# Patient Record
Sex: Female | Born: 1950
Health system: Southern US, Community
[De-identification: ages and names within clinical notes are randomized; demographics above are authoritative.]

## PROBLEM LIST (undated history)

## (undated) DIAGNOSIS — Z87442 Personal history of urinary calculi: Secondary | ICD-10-CM

## (undated) DIAGNOSIS — B9689 Other specified bacterial agents as the cause of diseases classified elsewhere: Secondary | ICD-10-CM

## (undated) DIAGNOSIS — E119 Type 2 diabetes mellitus without complications: Secondary | ICD-10-CM

## (undated) DIAGNOSIS — C50212 Malignant neoplasm of upper-inner quadrant of left female breast: Secondary | ICD-10-CM

## (undated) DIAGNOSIS — Z7982 Long term (current) use of aspirin: Secondary | ICD-10-CM

## (undated) DIAGNOSIS — K429 Umbilical hernia without obstruction or gangrene: Secondary | ICD-10-CM

## (undated) DIAGNOSIS — K449 Diaphragmatic hernia without obstruction or gangrene: Secondary | ICD-10-CM

## (undated) DIAGNOSIS — I10 Essential (primary) hypertension: Secondary | ICD-10-CM

## (undated) DIAGNOSIS — I700 Atherosclerosis of aorta: Secondary | ICD-10-CM

## (undated) DIAGNOSIS — J189 Pneumonia, unspecified organism: Secondary | ICD-10-CM

## (undated) DIAGNOSIS — Z170 Estrogen receptor positive status [ER+]: Secondary | ICD-10-CM

## (undated) DIAGNOSIS — H269 Unspecified cataract: Secondary | ICD-10-CM

## (undated) DIAGNOSIS — E559 Vitamin D deficiency, unspecified: Secondary | ICD-10-CM

## (undated) DIAGNOSIS — E785 Hyperlipidemia, unspecified: Secondary | ICD-10-CM

## (undated) DIAGNOSIS — M170 Bilateral primary osteoarthritis of knee: Secondary | ICD-10-CM

## (undated) DIAGNOSIS — N958 Other specified menopausal and perimenopausal disorders: Secondary | ICD-10-CM

## (undated) DIAGNOSIS — M1990 Unspecified osteoarthritis, unspecified site: Secondary | ICD-10-CM

## (undated) DIAGNOSIS — N390 Urinary tract infection, site not specified: Secondary | ICD-10-CM

## (undated) DIAGNOSIS — C50919 Malignant neoplasm of unspecified site of unspecified female breast: Secondary | ICD-10-CM

## (undated) DIAGNOSIS — I890 Lymphedema, not elsewhere classified: Secondary | ICD-10-CM

## (undated) DIAGNOSIS — Z79811 Long term (current) use of aromatase inhibitors: Secondary | ICD-10-CM

## (undated) HISTORY — PX: OTHER SURGICAL HISTORY: SHX169

## (undated) HISTORY — DX: Unspecified cataract: H26.9

## (undated) HISTORY — DX: Hyperlipidemia, unspecified: E78.5

## (undated) HISTORY — PX: BREAST LUMPECTOMY: SHX2

## (undated) HISTORY — PX: COLONOSCOPY: SHX174

## (undated) HISTORY — PX: BREAST BIOPSY: SHX20

## (undated) HISTORY — DX: Type 2 diabetes mellitus without complications: E11.9

## (undated) HISTORY — DX: Malignant neoplasm of unspecified site of unspecified female breast: C50.919

## (undated) HISTORY — PX: EYE SURGERY: SHX253

## (undated) HISTORY — PX: BREAST EXCISIONAL BIOPSY: SUR124

## (undated) HISTORY — DX: Essential (primary) hypertension: I10

## (undated) HISTORY — DX: Vitamin D deficiency, unspecified: E55.9

## (undated) HISTORY — PX: DILATION AND CURETTAGE OF UTERUS: SHX78

## (undated) HISTORY — PX: ABDOMINAL HYSTERECTOMY: SHX81

## (undated) HISTORY — DX: Unspecified osteoarthritis, unspecified site: M19.90

---

## 2002-04-05 HISTORY — PX: EYE SURGERY: SHX253

## 2016-12-09 ENCOUNTER — Other Ambulatory Visit: Payer: Self-pay | Admitting: Family Medicine

## 2016-12-09 DIAGNOSIS — Z803 Family history of malignant neoplasm of breast: Secondary | ICD-10-CM

## 2016-12-24 ENCOUNTER — Ambulatory Visit
Admission: RE | Admit: 2016-12-24 | Discharge: 2016-12-24 | Disposition: A | Discharge status: Home / Self Care | Payer: Medicare Other | Source: Ambulatory Visit | Attending: Family Medicine | Admitting: Family Medicine

## 2016-12-24 DIAGNOSIS — Z803 Family history of malignant neoplasm of breast: Secondary | ICD-10-CM

## 2016-12-24 DIAGNOSIS — Z1231 Encounter for screening mammogram for malignant neoplasm of breast: Secondary | ICD-10-CM | POA: Insufficient documentation

## 2017-01-13 ENCOUNTER — Other Ambulatory Visit: Payer: Self-pay | Admitting: *Deleted

## 2017-01-13 ENCOUNTER — Inpatient Hospital Stay
Admission: RE | Admit: 2017-01-13 | Discharge: 2017-01-13 | Disposition: A | Discharge status: Home / Self Care | Payer: Self-pay | Source: Ambulatory Visit | Attending: *Deleted | Admitting: *Deleted

## 2017-01-13 DIAGNOSIS — Z9289 Personal history of other medical treatment: Secondary | ICD-10-CM

## 2018-01-31 ENCOUNTER — Other Ambulatory Visit: Payer: Self-pay | Admitting: Physician Assistant

## 2018-01-31 DIAGNOSIS — Z1231 Encounter for screening mammogram for malignant neoplasm of breast: Secondary | ICD-10-CM

## 2018-03-23 ENCOUNTER — Encounter (HOSPITAL_COMMUNITY): Payer: Self-pay

## 2018-03-23 ENCOUNTER — Ambulatory Visit
Admission: RE | Admit: 2018-03-23 | Discharge: 2018-03-23 | Disposition: A | Discharge status: Home / Self Care | Payer: Medicare Other | Source: Ambulatory Visit | Attending: Physician Assistant | Admitting: Physician Assistant

## 2018-03-23 DIAGNOSIS — Z1231 Encounter for screening mammogram for malignant neoplasm of breast: Secondary | ICD-10-CM | POA: Insufficient documentation

## 2018-08-08 ENCOUNTER — Other Ambulatory Visit: Payer: Self-pay

## 2018-08-08 ENCOUNTER — Ambulatory Visit (INDEPENDENT_AMBULATORY_CARE_PROVIDER_SITE_OTHER): Payer: Medicare Other | Admitting: Family Medicine

## 2018-08-08 ENCOUNTER — Encounter: Payer: Self-pay | Admitting: Family Medicine

## 2018-08-08 VITALS — BP 113/64 | HR 61 | Temp 96.7°F | Ht 63.0 in | Wt 336.0 lb

## 2018-08-08 DIAGNOSIS — M170 Bilateral primary osteoarthritis of knee: Secondary | ICD-10-CM

## 2018-08-08 DIAGNOSIS — E119 Type 2 diabetes mellitus without complications: Secondary | ICD-10-CM | POA: Diagnosis not present

## 2018-08-08 DIAGNOSIS — I10 Essential (primary) hypertension: Secondary | ICD-10-CM

## 2018-08-08 DIAGNOSIS — Z7689 Persons encountering health services in other specified circumstances: Secondary | ICD-10-CM

## 2018-08-08 DIAGNOSIS — E559 Vitamin D deficiency, unspecified: Secondary | ICD-10-CM

## 2018-08-08 DIAGNOSIS — Z6841 Body Mass Index (BMI) 40.0 and over, adult: Secondary | ICD-10-CM

## 2018-08-08 DIAGNOSIS — I152 Hypertension secondary to endocrine disorders: Secondary | ICD-10-CM | POA: Insufficient documentation

## 2018-08-08 DIAGNOSIS — E118 Type 2 diabetes mellitus with unspecified complications: Secondary | ICD-10-CM | POA: Insufficient documentation

## 2018-08-08 DIAGNOSIS — E785 Hyperlipidemia, unspecified: Secondary | ICD-10-CM | POA: Insufficient documentation

## 2018-08-08 DIAGNOSIS — E1169 Type 2 diabetes mellitus with other specified complication: Secondary | ICD-10-CM | POA: Insufficient documentation

## 2018-08-08 DIAGNOSIS — E782 Mixed hyperlipidemia: Secondary | ICD-10-CM

## 2018-08-08 DIAGNOSIS — E1159 Type 2 diabetes mellitus with other circulatory complications: Secondary | ICD-10-CM

## 2018-08-08 MED ORDER — METFORMIN HCL ER 500 MG PO TB24: 500.0000 mg | ORAL_TABLET | Freq: Two times a day (BID) | ORAL | 1 refills | Status: DC

## 2018-08-08 MED ORDER — LOSARTAN POTASSIUM 50 MG PO TABS: 50.0000 mg | ORAL_TABLET | Freq: Every day | ORAL | 1 refills | Status: DC

## 2018-08-08 MED ORDER — DICLOFENAC SODIUM 1 % TD GEL: 2.0000 g | Freq: Four times a day (QID) | TRANSDERMAL | 2 refills | Status: DC

## 2018-08-08 MED ORDER — SPIRONOLACTONE 50 MG PO TABS: 50.0000 mg | ORAL_TABLET | Freq: Every day | ORAL | 1 refills | Status: DC

## 2018-08-08 MED ORDER — OMEGA-3-ACID ETHYL ESTERS 1 G PO CAPS: 1.0000 | ORAL_CAPSULE | Freq: Every day | ORAL | 1 refills | Status: DC

## 2018-08-08 MED ORDER — LOVASTATIN 10 MG PO TABS: 10.0000 mg | ORAL_TABLET | Freq: Every day | ORAL | 1 refills | Status: DC

## 2018-08-08 NOTE — Progress Notes (Signed)
BP 113/64 (BP Location: Right Arm, Patient Position: Sitting, Cuff Size: Normal)   Pulse 61   Temp (!) 96.7 F (35.9 C) (Tympanic)   Ht 5\' 3"  (1.6 m)   Wt (!) 336 lb (152.4 kg)   BMI 59.52 kg/m    Subjective:    Patient ID: Sharon Ware, female    DOB: 27-Jul-1950, 68 y.o.   MRN: [id number]  HPI: Sharon Ware is a 68 y.o. female  Chief Complaint  Patient presents with  . Establish Care  . Medication Refill    Losartan and Lovastatin    . This visit was completed via WebEx due to the restrictions of the COVID-19 pandemic. All issues as above were discussed and addressed. Physical exam was done as above through visual confirmation on WebEx. If it was felt that the patient should be evaluated in the office, they were directed there. The patient verbally consented to this visit. . Location of the patient: home . Location of the provider: home . Those involved with this call:  . Provider: Merrie Roof, PA-C . CMA: Merilyn Baba, [facility] . Front Desk/Registration: Jill Side  . Time spent on call: 30 minutes with patient face to face via video conference. More than 50% of this time was spent in counseling and coordination of care. 10 minutes total spent in review of patient's record and preparation of their chart. I verified patient identity using two factors (patient name and date of birth). Patient consents verbally to being seen via telemedicine visit today.   Presenting today to establish care.   Was previously on naproxen for arthritis of b/l knees, states this helps some but not enough and she knows she shouldn't take it all the time. Sometimes having some swelling, no redness, recent injury, falls. Does typically struggle walking longer distances because of her knees, will use a wheelchair in certain circumstances. Does take glucosamine supplements which don't seem to help.   Last labs were back in June 2019. Last f/u in December 2019. Flu shot 12/10/2017. Has records that she  will bring in from her previous provider.   DM - Last A1C back in June was 6.3, which is about where it usually runs per patient. Last eye exam 03/31/2018 with no abnormal findings. Taking metformin without issue. Trying to eat well. Does not exercise due to arthritis pain.   Home BPs typically running 110/60s-70s. Taking losartan and spironolactone without issue. Denies CP, SOB, HAs, dizziness.   Takes lovastatin and lovaza for HLD. Denies side effects, claudication, CP, SOB.   Hx of vitamin D deficiency, currently on 5000 IU of OTC vitamin D daily.  Relevant past medical, surgical, family and social history reviewed and updated as indicated. Interim medical history since our last visit reviewed. Allergies and medications reviewed and updated.  Review of Systems  Per HPI unless specifically indicated above     Objective:    BP 113/64 (BP Location: Right Arm, Patient Position: Sitting, Cuff Size: Normal)   Pulse 61   Temp (!) 96.7 F (35.9 C) (Tympanic)   Ht 5\' 3"  (1.6 m)   Wt (!) 336 lb (152.4 kg)   BMI 59.52 kg/m   Wt Readings from Last 3 Encounters:  08/08/18 (!) 336 lb (152.4 kg)    Physical Exam Vitals signs and nursing note reviewed.  Constitutional:      General: She is not in acute distress.    Appearance: She is obese.  HENT:     Head: Atraumatic.  Right Ear: External ear normal.     Left Ear: External ear normal.     Nose: Nose normal. No congestion.     Mouth/Throat:     Mouth: Mucous membranes are moist.     Pharynx: Oropharynx is clear. No posterior oropharyngeal erythema.  Eyes:     Extraocular Movements: Extraocular movements intact.     Conjunctiva/sclera: Conjunctivae normal.  Neck:     Musculoskeletal: Normal range of motion.  Cardiovascular:     Comments: Unable to assess via virtual visit Pulmonary:     Effort: Pulmonary effort is normal. No respiratory distress.  Musculoskeletal: Normal range of motion.  Skin:    General: Skin is dry.      Findings: No erythema.  Neurological:     Mental Status: She is alert and oriented to person, place, and time.  Psychiatric:        Mood and Affect: Mood normal.        Thought Content: Thought content normal.        Judgment: Judgment normal.     No results found for this or any previous visit.    Assessment & Plan:   Problem List Items Addressed This Visit      Cardiovascular and Mediastinum   Hypertension associated with diabetes ([place])    Stable and under good control, continue current regimen. Pt set up with lab appt for safety check      Relevant Medications   aspirin 81 MG tablet   spironolactone (ALDACTONE) 50 MG tablet   omega-3 acid ethyl esters (LOVAZA) 1 g capsule   metFORMIN (GLUCOPHAGE-XR) 500 MG 24 hr tablet   lovastatin (MEVACOR) 10 MG tablet   losartan (COZAAR) 50 MG tablet   Other Relevant Orders   Comprehensive metabolic panel (Completed)     Endocrine   Diabetes mellitus without complication (HCC) - Primary    Recheck A1C, adjust as needed. Continue working on lifestyle modifications and current regimen      Relevant Medications   aspirin 81 MG tablet   metFORMIN (GLUCOPHAGE-XR) 500 MG 24 hr tablet   lovastatin (MEVACOR) 10 MG tablet   losartan (COZAAR) 50 MG tablet   Other Relevant Orders   HgB A1c (Completed)     Musculoskeletal and Integument   Arthritis of both knees    Will start diclofenac gel to help reduce amount of PO NSAIDs she's taking. Can take tylenol prn. Work on weight loss, low impact exercises      Relevant Medications   aspirin 81 MG tablet   acetaminophen (TYLENOL) 650 MG CR tablet     Other   Morbid obesity (HCC)   Relevant Medications   metFORMIN (GLUCOPHAGE-XR) 500 MG 24 hr tablet   BMI 50.0-59.9, adult (HCC)    Lifestyle modifications reviewed      Relevant Medications   metFORMIN (GLUCOPHAGE-XR) 500 MG 24 hr tablet   Hyperlipidemia    Recheck lipids, adjust as needed. Continue current regimen and work on diet  and exercise changes      Relevant Medications   aspirin 81 MG tablet   spironolactone (ALDACTONE) 50 MG tablet   omega-3 acid ethyl esters (LOVAZA) 1 g capsule   lovastatin (MEVACOR) 10 MG tablet   losartan (COZAAR) 50 MG tablet   Other Relevant Orders   Lipid Panel w/o Chol/HDL Ratio (Completed)   Vitamin D deficiency    Will check levels to ensure adequate supplementation. Continue current regimen      Relevant Orders  VITAMIN D 25 Hydroxy (Vit-D Deficiency, Fractures) (Completed)    Other Visit Diagnoses    Encounter to establish care           Follow up plan: Return in about 6 months (around 02/08/2019) for 6 month f/u.

## 2018-08-09 ENCOUNTER — Other Ambulatory Visit: Payer: Medicare Other

## 2018-08-09 ENCOUNTER — Ambulatory Visit (INDEPENDENT_AMBULATORY_CARE_PROVIDER_SITE_OTHER): Payer: Medicare Other

## 2018-08-09 ENCOUNTER — Telehealth: Payer: Self-pay

## 2018-08-09 ENCOUNTER — Other Ambulatory Visit: Payer: Self-pay | Admitting: Nurse Practitioner

## 2018-08-09 DIAGNOSIS — E559 Vitamin D deficiency, unspecified: Secondary | ICD-10-CM

## 2018-08-09 DIAGNOSIS — E782 Mixed hyperlipidemia: Secondary | ICD-10-CM

## 2018-08-09 DIAGNOSIS — E1159 Type 2 diabetes mellitus with other circulatory complications: Secondary | ICD-10-CM

## 2018-08-09 DIAGNOSIS — I10 Essential (primary) hypertension: Secondary | ICD-10-CM

## 2018-08-09 DIAGNOSIS — E119 Type 2 diabetes mellitus without complications: Secondary | ICD-10-CM

## 2018-08-09 DIAGNOSIS — Z23 Encounter for immunization: Secondary | ICD-10-CM

## 2018-08-09 NOTE — Telephone Encounter (Signed)
Received a fax that patient will need a PA on Omega 3-Acid Capsules.  Can patient get this OTC or does she need this certain Rx?

## 2018-08-09 NOTE — Addendum Note (Signed)
Addended by: Marnee Guarneri T on: 08/09/2018 11:15 AM   Modules accepted: Orders

## 2018-08-09 NOTE — Telephone Encounter (Signed)
She can take OTC if not covered by insurance

## 2018-08-09 NOTE — Progress Notes (Signed)
New patient with cut on hand, requires Td updated.

## 2018-08-09 NOTE — Telephone Encounter (Signed)
Patient notified

## 2018-08-10 LAB — HEMOGLOBIN A1C
Est. average glucose Bld gHb Est-mCnc: 143 mg/dL
Hgb A1c MFr Bld: 6.6 % — ABNORMAL HIGH (ref 4.8–5.6)

## 2018-08-10 LAB — COMPREHENSIVE METABOLIC PANEL
ALT: 22 IU/L (ref 0–32)
AST: 19 IU/L (ref 0–40)
Albumin/Globulin Ratio: 1.6 (ref 1.2–2.2)
Albumin: 4.4 g/dL (ref 3.8–4.8)
Alkaline Phosphatase: 61 IU/L (ref 39–117)
BUN/Creatinine Ratio: 23 (ref 12–28)
BUN: 20 mg/dL (ref 8–27)
Bilirubin Total: 0.4 mg/dL (ref 0.0–1.2)
CO2: 22 mmol/L (ref 20–29)
Calcium: 9.5 mg/dL (ref 8.7–10.3)
Chloride: 100 mmol/L (ref 96–106)
Creatinine, Ser: 0.86 mg/dL (ref 0.57–1.00)
GFR calc Af Amer: 80 mL/min/{1.73_m2} (ref >59)
GFR calc non Af Amer: 69 mL/min/{1.73_m2} (ref >59)
Globulin, Total: 2.7 g/dL (ref 1.5–4.5)
Glucose: 131 mg/dL — ABNORMAL HIGH (ref 65–99)
Potassium: 4.7 mmol/L (ref 3.5–5.2)
Sodium: 139 mmol/L (ref 134–144)
Total Protein: 7.1 g/dL (ref 6.0–8.5)

## 2018-08-10 LAB — LIPID PANEL W/O CHOL/HDL RATIO
Cholesterol, Total: 164 mg/dL (ref 100–199)
HDL: 43 mg/dL (ref >39)
LDL Calculated: 80 mg/dL (ref 0–99)
Triglycerides: 204 mg/dL — ABNORMAL HIGH (ref 0–149)
VLDL Cholesterol Cal: 41 mg/dL — ABNORMAL HIGH (ref 5–40)

## 2018-08-10 LAB — VITAMIN D 25 HYDROXY (VIT D DEFICIENCY, FRACTURES): Vit D, 25-Hydroxy: 61.7 ng/mL (ref 30.0–100.0)

## 2018-08-11 NOTE — Assessment & Plan Note (Signed)
Recheck lipids, adjust as needed. Continue current regimen and work on diet and exercise changes

## 2018-08-11 NOTE — Assessment & Plan Note (Signed)
Will start diclofenac gel to help reduce amount of PO NSAIDs she's taking. Can take tylenol prn. Work on weight loss, low impact exercises

## 2018-08-11 NOTE — Assessment & Plan Note (Signed)
Recheck A1C, adjust as needed. Continue working on lifestyle modifications and current regimen

## 2018-08-11 NOTE — Assessment & Plan Note (Signed)
Lifestyle modifications reviewed

## 2018-08-11 NOTE — Assessment & Plan Note (Signed)
Will check levels to ensure adequate supplementation. Continue current regimen

## 2018-08-11 NOTE — Assessment & Plan Note (Signed)
Stable and under good control, continue current regimen. Pt set up with lab appt for safety check

## 2019-02-16 ENCOUNTER — Ambulatory Visit (INDEPENDENT_AMBULATORY_CARE_PROVIDER_SITE_OTHER): Payer: Medicare Other | Admitting: Family Medicine

## 2019-02-16 ENCOUNTER — Other Ambulatory Visit: Payer: Self-pay

## 2019-02-16 ENCOUNTER — Ambulatory Visit: Payer: Medicare Other | Admitting: Family Medicine

## 2019-02-16 ENCOUNTER — Encounter: Payer: Self-pay | Admitting: Family Medicine

## 2019-02-16 VITALS — BP 113/71 | HR 67 | Temp 98.3°F

## 2019-02-16 DIAGNOSIS — E1159 Type 2 diabetes mellitus with other circulatory complications: Secondary | ICD-10-CM

## 2019-02-16 DIAGNOSIS — E559 Vitamin D deficiency, unspecified: Secondary | ICD-10-CM | POA: Diagnosis not present

## 2019-02-16 DIAGNOSIS — E782 Mixed hyperlipidemia: Secondary | ICD-10-CM

## 2019-02-16 DIAGNOSIS — M170 Bilateral primary osteoarthritis of knee: Secondary | ICD-10-CM

## 2019-02-16 DIAGNOSIS — E119 Type 2 diabetes mellitus without complications: Secondary | ICD-10-CM

## 2019-02-16 DIAGNOSIS — I10 Essential (primary) hypertension: Secondary | ICD-10-CM

## 2019-02-16 MED ORDER — OMEGA-3-ACID ETHYL ESTERS 1 G PO CAPS: 1.0000 | ORAL_CAPSULE | Freq: Every day | ORAL | 1 refills | Status: DC

## 2019-02-16 MED ORDER — METFORMIN HCL ER 500 MG PO TB24: 500.0000 mg | ORAL_TABLET | Freq: Two times a day (BID) | ORAL | 1 refills | Status: DC

## 2019-02-16 MED ORDER — LOSARTAN POTASSIUM 50 MG PO TABS: 50.0000 mg | ORAL_TABLET | Freq: Every day | ORAL | 1 refills | Status: DC

## 2019-02-16 MED ORDER — SPIRONOLACTONE 50 MG PO TABS: 50.0000 mg | ORAL_TABLET | Freq: Every day | ORAL | 1 refills | Status: DC

## 2019-02-16 MED ORDER — LOVASTATIN 10 MG PO TABS: 10.0000 mg | ORAL_TABLET | Freq: Every day | ORAL | 1 refills | Status: DC

## 2019-02-16 NOTE — Assessment & Plan Note (Signed)
Recheck lipids, adjust as needed. Continue working on lifestyle modifications 

## 2019-02-16 NOTE — Assessment & Plan Note (Signed)
Continue tylenol prn, declines trying voltaren gel

## 2019-02-16 NOTE — Progress Notes (Signed)
BP 113/71   Pulse 67   Temp 98.3 F (36.8 C) (Oral)   SpO2 96%    Subjective:    Patient ID: Sharon Ware, female    DOB: Feb 01, 1951, 68 y.o.   MRN: [id number]  HPI: Sharon Ware is a 68 y.o. female  Chief Complaint  Patient presents with  . Diabetes  . Hyperlipidemia  . Hypertension   Patient presenting today for 6 month f/u chronic conditions.   No new concerns today.   Has not taken the diclofenac gel for her knee arthritis because she read the package insert and felt it was too risky based on her age and other medications. Takes 2 650 mg tylenol daily typically with some mild improvement in pain sxs. Also uses icy hot often.   DM - does not check home sugars. Tries to watch what she eats. Cannot exercise due to her arthritic knee pain. Denies low blood sugar spells. Taking her metformin regularly. UTD on eye exam.   HTN - Not checking home readings regularly. Tolerating medicines well, taking faithfully. Denies Cp, SOB, HAs, dizziness. On lovastatin and lovaza for HLD without issue.   Takes vit D supplements OTC daily, last check showed adequate supplementation which was 6 months ago.   Relevant past medical, surgical, family and social history reviewed and updated as indicated. Interim medical history since our last visit reviewed. Allergies and medications reviewed and updated.  Review of Systems  Per HPI unless specifically indicated above     Objective:    BP 113/71   Pulse 67   Temp 98.3 F (36.8 C) (Oral)   SpO2 96%   Wt Readings from Last 3 Encounters:  08/08/18 (!) 336 lb (152.4 kg)    Physical Exam Vitals signs and nursing note reviewed.  Constitutional:      Appearance: Normal appearance. She is not ill-appearing.  HENT:     Head: Atraumatic.  Eyes:     Extraocular Movements: Extraocular movements intact.     Conjunctiva/sclera: Conjunctivae normal.  Neck:     Musculoskeletal: Normal range of motion and neck supple.  Cardiovascular:    Rate and Rhythm: Normal rate and regular rhythm.     Heart sounds: Normal heart sounds.  Pulmonary:     Effort: Pulmonary effort is normal.     Breath sounds: Normal breath sounds.  Musculoskeletal:     Comments: In wheelchair  Skin:    General: Skin is warm and dry.  Neurological:     Mental Status: She is alert and oriented to person, place, and time.  Psychiatric:        Mood and Affect: Mood normal.        Thought Content: Thought content normal.        Judgment: Judgment normal.     Diabetic Foot Exam - Simple   Simple Foot Form Diabetic Foot exam was performed with the following findings: Yes 02/16/2019  9:56 AM  Visual Inspection No deformities, no ulcerations, no other skin breakdown bilaterally: Yes Sensation Testing Intact to touch and monofilament testing bilaterally: Yes Pulse Check Posterior Tibialis and Dorsalis pulse intact bilaterally: Yes Comments     Results for orders placed or performed in visit on 08/09/18  HgB A1c  Result Value Ref Range   Hgb A1c MFr Bld 6.6 (H) 4.8 - 5.6 %   Est. average glucose Bld gHb Est-mCnc 143 mg/dL  VITAMIN D 25 Hydroxy (Vit-D Deficiency, Fractures)  Result Value Ref Range   Vit D,  25-Hydroxy 61.7 30.0 - 100.0 ng/mL  Lipid Panel w/o Chol/HDL Ratio  Result Value Ref Range   Cholesterol, Total 164 100 - 199 mg/dL   Triglycerides 204 (H) 0 - 149 mg/dL   HDL 43 >39 mg/dL   VLDL Cholesterol Cal 41 (H) 5 - 40 mg/dL   LDL Calculated 80 0 - 99 mg/dL  Comprehensive metabolic panel  Result Value Ref Range   Glucose 131 (H) 65 - 99 mg/dL   BUN 20 8 - 27 mg/dL   Creatinine, Ser 0.86 0.57 - 1.00 mg/dL   GFR calc non Af Amer 69 >59 mL/min/1.73   GFR calc Af Amer 80 >59 mL/min/1.73   BUN/Creatinine Ratio 23 12 - 28   Sodium 139 134 - 144 mmol/L   Potassium 4.7 3.5 - 5.2 mmol/L   Chloride 100 96 - 106 mmol/L   CO2 22 20 - 29 mmol/L   Calcium 9.5 8.7 - 10.3 mg/dL   Total Protein 7.1 6.0 - 8.5 g/dL   Albumin 4.4 3.8 - 4.8 g/dL    Globulin, Total 2.7 1.5 - 4.5 g/dL   Albumin/Globulin Ratio 1.6 1.2 - 2.2   Bilirubin Total 0.4 0.0 - 1.2 mg/dL   Alkaline Phosphatase 61 39 - 117 IU/L   AST 19 0 - 40 IU/L   ALT 22 0 - 32 IU/L      Assessment & Plan:   Problem List Items Addressed This Visit      Cardiovascular and Mediastinum   Hypertension associated with diabetes ([place]) - Primary   Relevant Medications   spironolactone (ALDACTONE) 50 MG tablet   omega-3 acid ethyl esters (LOVAZA) 1 g capsule   metFORMIN (GLUCOPHAGE-XR) 500 MG 24 hr tablet   lovastatin (MEVACOR) 10 MG tablet   losartan (COZAAR) 50 MG tablet   Other Relevant Orders   Comprehensive metabolic panel     Endocrine   Diabetes mellitus without complication (HCC)    Recheck A1C, adjust as needed. Continue working on lifestyle modifications and taking current regimen      Relevant Medications   metFORMIN (GLUCOPHAGE-XR) 500 MG 24 hr tablet   lovastatin (MEVACOR) 10 MG tablet   losartan (COZAAR) 50 MG tablet   Other Relevant Orders   HgB A1c     Musculoskeletal and Integument   Arthritis of both knees    Continue tylenol prn, declines trying voltaren gel        Other   Hyperlipidemia    Recheck lipids, adjust as needed. Continue working on lifestyle modifications      Relevant Medications   spironolactone (ALDACTONE) 50 MG tablet   omega-3 acid ethyl esters (LOVAZA) 1 g capsule   lovastatin (MEVACOR) 10 MG tablet   losartan (COZAAR) 50 MG tablet   Other Relevant Orders   Lipid Panel w/o Chol/HDL Ratio out   Vitamin D deficiency    Stable and under good control, continue OTC supplementation          Follow up plan: Return in about 6 months (around 08/16/2019) for 6 month f/u.

## 2019-02-16 NOTE — Assessment & Plan Note (Signed)
Recheck A1C, adjust as needed. Continue working on lifestyle modifications and taking current regimen

## 2019-02-16 NOTE — Assessment & Plan Note (Signed)
Stable and under good control, continue OTC supplementation

## 2019-02-17 LAB — COMPREHENSIVE METABOLIC PANEL
ALT: 24 IU/L (ref 0–32)
AST: 16 IU/L (ref 0–40)
Albumin/Globulin Ratio: 1.4 (ref 1.2–2.2)
Albumin: 4.2 g/dL (ref 3.8–4.8)
Alkaline Phosphatase: 62 IU/L (ref 39–117)
BUN/Creatinine Ratio: 33 — ABNORMAL HIGH (ref 12–28)
BUN: 26 mg/dL (ref 8–27)
Bilirubin Total: 0.4 mg/dL (ref 0.0–1.2)
CO2: 22 mmol/L (ref 20–29)
Calcium: 9.5 mg/dL (ref 8.7–10.3)
Chloride: 100 mmol/L (ref 96–106)
Creatinine, Ser: 0.78 mg/dL (ref 0.57–1.00)
GFR calc Af Amer: 89 mL/min/{1.73_m2} (ref >59)
GFR calc non Af Amer: 77 mL/min/{1.73_m2} (ref >59)
Globulin, Total: 2.9 g/dL (ref 1.5–4.5)
Glucose: 116 mg/dL — ABNORMAL HIGH (ref 65–99)
Potassium: 4.5 mmol/L (ref 3.5–5.2)
Sodium: 140 mmol/L (ref 134–144)
Total Protein: 7.1 g/dL (ref 6.0–8.5)

## 2019-02-17 LAB — LIPID PANEL W/O CHOL/HDL RATIO
Cholesterol, Total: 163 mg/dL (ref 100–199)
HDL: 41 mg/dL (ref >39)
LDL Chol Calc (NIH): 93 mg/dL (ref 0–99)
Triglycerides: 170 mg/dL — ABNORMAL HIGH (ref 0–149)
VLDL Cholesterol Cal: 29 mg/dL (ref 5–40)

## 2019-02-17 LAB — HEMOGLOBIN A1C
Est. average glucose Bld gHb Est-mCnc: 134 mg/dL
Hgb A1c MFr Bld: 6.3 % — ABNORMAL HIGH (ref 4.8–5.6)

## 2019-06-28 ENCOUNTER — Ambulatory Visit (INDEPENDENT_AMBULATORY_CARE_PROVIDER_SITE_OTHER): Payer: Medicare Other

## 2019-06-28 VITALS — BP 107/65 | HR 67 | Ht 63.0 in | Wt 338.8 lb

## 2019-06-28 DIAGNOSIS — Z0000 Encounter for general adult medical examination without abnormal findings: Secondary | ICD-10-CM

## 2019-06-28 NOTE — Progress Notes (Signed)
Subjective:   Sharon Ware is a 69 y.o. female who presents for an Initial Medicare Annual Wellness Visit.  This visit is being conducted via phone call  - after an attmept to do on video chat - due to the COVID-19 pandemic. This patient has given me verbal consent via phone to conduct this visit, patient states they are participating from their home address. Some vital signs may be absent or patient reported.   Patient identification: identified by name, DOB, and current address.    Review of Systems      Cardiac Risk Factors include: advanced age (>22men, >19 women);dyslipidemia;hypertension;obesity (BMI >30kg/m2)     Objective:    Today's Vitals   06/28/19 1012  BP: 107/65  Pulse: 67  Weight: (!) 338 lb 12.8 oz (153.7 kg)  Height: 5\' 3"  (1.6 m)   Body mass index is 60.02 kg/m.  No flowsheet data found.  Current Medications (verified) Outpatient Encounter Medications as of 06/28/2019  Medication Sig  . acetaminophen (TYLENOL) 650 MG CR tablet Take 650 mg by mouth 2 (two) times a day.  Marland Kitchen aspirin 81 MG tablet Take 81 mg by mouth daily.  . Cholecalciferol (VITAMIN D3 PO) Take 5,000 Units by mouth daily.  Marland Kitchen GLUCOSAMINE HCL PO Take by mouth.  . losartan (COZAAR) 50 MG tablet Take 1 tablet (50 mg total) by mouth daily.  Marland Kitchen lovastatin (MEVACOR) 10 MG tablet Take 1 tablet (10 mg total) by mouth daily.  . Menthol, Topical Analgesic, (ICY HOT EX) Apply topically.  . metFORMIN (GLUCOPHAGE-XR) 500 MG 24 hr tablet Take 1 tablet (500 mg total) by mouth 2 (two) times daily.  Marland Kitchen omega-3 acid ethyl esters (LOVAZA) 1 g capsule Take 1 capsule (1 g total) by mouth daily.  Marland Kitchen spironolactone (ALDACTONE) 50 MG tablet Take 1 tablet (50 mg total) by mouth daily.   No facility-administered encounter medications on file as of 06/28/2019.    Allergies (verified) Morphine and related and Latex   History: Past Medical History:  Diagnosis Date  . Arthritis   . Cataract   . Diabetes mellitus  without complication ([place])   . Hyperlipidemia   . Hypertension   . Vitamin D deficiency    Past Surgical History:  Procedure Laterality Date  . ABDOMINAL HYSTERECTOMY    . BREAST BIOPSY Right 1990s   neg  . BREAST BIOPSY Right 2000s   neg  . BREAST EXCISIONAL BIOPSY Right    neg  . EYE SURGERY    . Salpingo-oophorectomy     Family History  Problem Relation Age of Onset  . Breast cancer Mother 58  . Heart disease Father   . Cancer Paternal Grandfather   . Heart disease Paternal Grandfather    Social History   Socioeconomic History  . Marital status: Married    Spouse name: Not on file  . Number of children: Not on file  . Years of education: Not on file  . Highest education level: Not on file  Occupational History  . Occupation: retired  Tobacco Use  . Smoking status: Never Smoker  . Smokeless tobacco: Never Used  Substance and Sexual Activity  . Alcohol use: Not Currently  . Drug use: Never  . Sexual activity: Not on file  Other Topics Concern  . Not on file  Social History Narrative  . Not on file   Social Determinants of Health   Financial Resource Strain:   . Difficulty of Paying Living Expenses:   Food Insecurity:   .  Worried About Charity fundraiser in the Last Year:   . Arboriculturist in the Last Year:   Transportation Needs:   . Film/video editor (Medical):   Marland Kitchen Lack of Transportation (Non-Medical):   Physical Activity:   . Days of Exercise per Week:   . Minutes of Exercise per Session:   Stress:   . Feeling of Stress :   Social Connections:   . Frequency of Communication with Friends and Family:   . Frequency of Social Gatherings with Friends and Family:   . Attends Religious Services:   . Active Member of Clubs or Organizations:   . Attends Archivist Meetings:   Marland Kitchen Marital Status:     Tobacco Counseling Counseling given: Not Answered   Clinical Intake:  Pre-visit preparation completed: Yes  Pain : No/denies pain      Nutritional Status: BMI > 30  Obese Nutritional Risks: None Diabetes: Yes CBG done?: No Did pt. bring in CBG monitor from home?: No  How often do you need to have someone help you when you read instructions, pamphlets, or other written materials from your doctor or pharmacy?: 1 - Never  Interpreter Needed?: No  Information entered by :: Tiffany Hill,LPN   Activities of Daily Living In your present state of health, do you have any difficulty performing the following activities: 06/28/2019 02/16/2019  Hearing? N N  Comment no hearing aids -  Vision? N N  Comment inocular lens, Dr.King -  Difficulty concentrating or making decisions? N N  Walking or climbing stairs? N Y  Dressing or bathing? N Y  Doing errands, shopping? N N  Preparing Food and eating ? N -  Using the Toilet? N -  In the past six months, have you accidently leaked urine? Y -  Comment pads -  Do you have problems with loss of bowel control? N -  Managing your Medications? N -  Managing your Finances? N -  Housekeeping or managing your Housekeeping? N -  Some recent data might be hidden     Immunizations and Health Maintenance Immunization History  Administered Date(s) Administered  . Influenza-Unspecified 12/09/2018  . Pneumococcal Conjugate-13 02/03/2014  . Pneumococcal Polysaccharide-23 04/05/2009, 04/05/2014  . Td 08/09/2018   Health Maintenance Due  Topic Date Due  . OPHTHALMOLOGY EXAM  04/01/2019    Patient Care Team: Volney American, PA-C as PCP - General (Family Medicine)  Indicate any recent Medical Services you may have received from other than [facility] providers in the past year (date may be approximate).     Assessment:   This is a routine wellness examination for [facility].  Hearing/Vision screen  Hearing Screening   125Hz  250Hz  500Hz  1000Hz  2000Hz  3000Hz  4000Hz  6000Hz  8000Hz   Right ear:           Left ear:           Vision Screening Comments: Dr.King   Dietary issues and  exercise activities discussed: Current Exercise Habits: Home exercise routine, Type of exercise: stretching, Time (Minutes): 20, Frequency (Times/Week): 1, Weekly Exercise (Minutes/Week): 20, Intensity: Mild, Exercise limited by: None identified  Goals Addressed   None    Depression Screen PHQ 2/9 Scores 08/08/2018  PHQ - 2 Score 0  PHQ- 9 Score 0    Fall Risk Fall Risk  06/28/2019 08/08/2018  Falls in the past year? 0 0  Number falls in past yr: 0 0  Comment - None  Injury with Fall? 0 0  Risk for fall due to : - Impaired balance/gait  Follow up - Falls evaluation completed   FALL RISK PREVENTION PERTAINING TO THE HOME:  Any stairs in or around the home? Yes  If so, are there any without handrails? No   Home free of loose throw rugs in walkways, pet beds, electrical cords, etc? Yes  Adequate lighting in your home to reduce risk of falls? Yes   ASSISTIVE DEVICES UTILIZED TO PREVENT FALLS:  Life alert? No  Use of a cane, walker or w/c? Yes  walker  Grab bars in the bathroom? Yes  Shower chair or bench in shower? Yes  Elevated toilet seat or a handicapped toilet? Yes    DME ORDERS:  DME order needed?  No   TIMED UP AND GO:  Unable to perform    Cognitive Function:        Screening Tests Health Maintenance  Topic Date Due  . OPHTHALMOLOGY EXAM  04/01/2019  . COLONOSCOPY  02/16/2020 (Originally 09/08/1998)  . Hepatitis C Screening  02/16/2020 (Originally 1950-04-29)  . HEMOGLOBIN A1C  08/16/2019  . FOOT EXAM  02/16/2020  . MAMMOGRAM  03/23/2020  . TETANUS/TDAP  08/08/2028  . INFLUENZA VACCINE  Completed  . DEXA SCAN  Completed  . PNA vac Low Risk Adult  Completed    Qualifies for Shingles Vaccine? Yes  Zostavax completed n/a. Due for Shingrix. Education has been provided regarding the importance of this vaccine. Pt has been advised to call insurance company to determine out of pocket expense. Advised may also receive vaccine at local pharmacy or Health Dept.  Verbalized acceptance and understanding.  Tdap: up to date   Flu Vaccine: up to date   Pneumococcal Vaccine: up to date   Covid-19 Vaccine: Completed vaccines  Cancer Screenings:  Colorectal Screening: Completed around age 35,  Repeat every 5 years  Mammogram: Completed 03/2018,  Repeat every year; will go after pandemic.   Bone Density: Completed 2014. Results reflect NORMAL  Lung Cancer Screening: (Low Dose CT Chest recommended if Age 72-80 years, 30 pack-year currently smoking OR have quit w/in 15years.) does not qualify.     Additional Screening:  Hepatitis C Screening: does qualify;declined  Vision Screening: Recommended annual ophthalmology exams for early detection of glaucoma and other disorders of the eye. Is the patient up to date with their annual eye exam?  Yes  Who is the provider or what is the name of the office in which the pt attends annual eye exams? Sees dr.king May 23rd, will have him fax over results for diabetic eye exam.   Dental Screening: Recommended annual dental exams for proper oral hygiene  Community Resource Referral:  CRR required this visit?  No       Plan:  I have personally reviewed and addressed the Medicare Annual Wellness questionnaire and have noted the following in the patient's chart:  A. Medical and social history B. Use of alcohol, tobacco or illicit drugs  C. Current medications and supplements D. Functional ability and status E.  Nutritional status F.  Physical activity G. Advance directives H. List of other physicians I.  Hospitalizations, surgeries, and ER visits in previous 12 months J.  [place] such as hearing and vision if needed, cognitive and depression L. Referrals and appointments   In addition, I have reviewed and discussed with patient certain preventive protocols, quality metrics, and best practice recommendations. A written personalized care plan for preventive services as well as general  preventive health  recommendations were provided to patient.   Signed,    Bevelyn Ngo, LPN   [id number]  Nurse Health Advisor   Nurse Notes: none  .

## 2019-06-28 NOTE — Patient Instructions (Signed)
Ms. Sharon Ware , Thank you for taking time to come for your Medicare Wellness Visit. I appreciate your ongoing commitment to your health goals. Please review the following plan we discussed and let me know if I can assist you in the future.   Screening recommendations/referrals: Colonoscopy: declined  Mammogram: declined currently Bone Density: up to date Recommended yearly ophthalmology/optometry visit for glaucoma screening and checkup Recommended yearly dental visit for hygiene and checkup  Vaccinations: Influenza vaccine: up to date Pneumococcal vaccine: up to date Tdap vaccine: up to date Shingles vaccine: shingrix eligible, check with your pharmacy for more information    Covid-19: completed   Advanced directives: Please bring a copy of your health care power of attorney and living will to the office at your convenience.  Conditions/risks identified: diabetic, please have [facility] fax over diabetic eye exam results once this is completed   Next appointment: follow up in one year for your annual wellness visit    Preventive Care 65 Years and Older, Female Preventive care refers to lifestyle choices and visits with your health care provider that can promote health and wellness. What does preventive care include?  A yearly physical exam. This is also called an annual well check.  Dental exams once or twice a year.  Routine eye exams. Ask your health care provider how often you should have your eyes checked.  Personal lifestyle choices, including:  Daily care of your teeth and gums.  Regular physical activity.  Eating a healthy diet.  Avoiding tobacco and drug use.  Limiting alcohol use.  Practicing safe sex.  Taking low-dose aspirin every day.  Taking vitamin and mineral supplements as recommended by your health care provider. What happens during an annual well check? The services and screenings done by your health care provider during your annual well check will  depend on your age, overall health, lifestyle risk factors, and family history of disease. Counseling  Your health care provider may ask you questions about your:  Alcohol use.  Tobacco use.  Drug use.  Emotional well-being.  Home and relationship well-being.  Sexual activity.  Eating habits.  History of falls.  Memory and ability to understand (cognition).  Work and work Statistician.  Reproductive health. Screening  You may have the following tests or measurements:  Height, weight, and BMI.  Blood pressure.  Lipid and cholesterol levels. These may be checked every 5 years, or more frequently if you are over 16 years old.  Skin check.  Lung cancer screening. You may have this screening every year starting at age 65 if you have a 30-pack-year history of smoking and currently smoke or have quit within the past 15 years.  Fecal occult blood test (FOBT) of the stool. You may have this test every year starting at age 69.  Flexible sigmoidoscopy or colonoscopy. You may have a sigmoidoscopy every 5 years or a colonoscopy every 10 years starting at age 55.  Hepatitis C blood test.  Hepatitis B blood test.  Sexually transmitted disease (STD) testing.  Diabetes screening. This is done by checking your blood sugar (glucose) after you have not eaten for a while (fasting). You may have this done every 1-3 years.  Bone density scan. This is done to screen for osteoporosis. You may have this done starting at age 60.  Mammogram. This may be done every 1-2 years. Talk to your health care provider about how often you should have regular mammograms. Talk with your health care provider about your test results,  treatment options, and if necessary, the need for more tests. Vaccines  Your health care provider may recommend certain vaccines, such as:  Influenza vaccine. This is recommended every year.  Tetanus, diphtheria, and acellular pertussis (Tdap, Td) vaccine. You may need a  Td booster every 10 years.  Zoster vaccine. You may need this after age 52.  Pneumococcal 13-valent conjugate (PCV13) vaccine. One dose is recommended after age 68.  Pneumococcal polysaccharide (PPSV23) vaccine. One dose is recommended after age 14. Talk to your health care provider about which screenings and vaccines you need and how often you need them. This information is not intended to replace advice given to you by your health care provider. Make sure you discuss any questions you have with your health care provider. Document Released: 04/18/2015 Document Revised: 12/10/2015 Document Reviewed: 01/21/2015 Elsevier Interactive Patient Education  2017 [employer] Prevention in the Home Falls can cause injuries. They can happen to people of all ages. There are many things you can do to make your home safe and to help prevent falls. What can I do on the outside of my home?  Regularly fix the edges of walkways and driveways and fix any cracks.  Remove anything that might make you trip as you walk through a door, such as a raised step or threshold.  Trim any bushes or trees on the path to your home.  Use bright outdoor lighting.  Clear any walking paths of anything that might make someone trip, such as rocks or tools.  Regularly check to see if handrails are loose or broken. Make sure that both sides of any steps have handrails.  Any raised decks and porches should have guardrails on the edges.  Have any leaves, snow, or ice cleared regularly.  Use sand or salt on walking paths during winter.  Clean up any spills in your garage right away. This includes oil or grease spills. What can I do in the bathroom?  Use night lights.  Install grab bars by the toilet and in the tub and shower. Do not use towel bars as grab bars.  Use non-skid mats or decals in the tub or shower.  If you need to sit down in the shower, use a plastic, non-slip stool.  Keep the floor dry. Clean  up any water that spills on the floor as soon as it happens.  Remove soap buildup in the tub or shower regularly.  Attach bath mats securely with double-sided non-slip rug tape.  Do not have throw rugs and other things on the floor that can make you trip. What can I do in the bedroom?  Use night lights.  Make sure that you have a light by your bed that is easy to reach.  Do not use any sheets or blankets that are too big for your bed. They should not hang down onto the floor.  Have a firm chair that has side arms. You can use this for support while you get dressed.  Do not have throw rugs and other things on the floor that can make you trip. What can I do in the kitchen?  Clean up any spills right away.  Avoid walking on wet floors.  Keep items that you use a lot in easy-to-reach places.  If you need to reach something above you, use a strong step stool that has a grab bar.  Keep electrical cords out of the way.  Do not use floor polish or wax that makes floors  slippery. If you must use wax, use non-skid floor wax.  Do not have throw rugs and other things on the floor that can make you trip. What can I do with my stairs?  Do not leave any items on the stairs.  Make sure that there are handrails on both sides of the stairs and use them. Fix handrails that are broken or loose. Make sure that handrails are as long as the stairways.  Check any carpeting to make sure that it is firmly attached to the stairs. Fix any carpet that is loose or worn.  Avoid having throw rugs at the top or bottom of the stairs. If you do have throw rugs, attach them to the floor with carpet tape.  Make sure that you have a light switch at the top of the stairs and the bottom of the stairs. If you do not have them, ask someone to add them for you. What else can I do to help prevent falls?  Wear shoes that:  Do not have high heels.  Have rubber bottoms.  Are comfortable and fit you well.  Are  closed at the toe. Do not wear sandals.  If you use a stepladder:  Make sure that it is fully opened. Do not climb a closed stepladder.  Make sure that both sides of the stepladder are locked into place.  Ask someone to hold it for you, if possible.  Clearly mark and make sure that you can see:  Any grab bars or handrails.  First and last steps.  Where the edge of each step is.  Use tools that help you move around (mobility aids) if they are needed. These include:  Canes.  Walkers.  Scooters.  Crutches.  Turn on the lights when you go into a dark area. Replace any light bulbs as soon as they burn out.  Set up your furniture so you have a clear path. Avoid moving your furniture around.  If any of your floors are uneven, fix them.  If there are any pets around you, be aware of where they are.  Review your medicines with your doctor. Some medicines can make you feel dizzy. This can increase your chance of falling. Ask your doctor what other things that you can do to help prevent falls. This information is not intended to replace advice given to you by your health care provider. Make sure you discuss any questions you have with your health care provider. Document Released: 01/16/2009 Document Revised: 08/28/2015 Document Reviewed: 04/26/2014 Elsevier Interactive Patient Education  2017 [employer]

## 2019-07-27 LAB — HM DIABETES EYE EXAM

## 2019-08-20 ENCOUNTER — Other Ambulatory Visit: Payer: Self-pay

## 2019-08-20 ENCOUNTER — Ambulatory Visit (INDEPENDENT_AMBULATORY_CARE_PROVIDER_SITE_OTHER): Payer: Medicare Other | Admitting: Family Medicine

## 2019-08-20 ENCOUNTER — Encounter: Payer: Self-pay | Admitting: Family Medicine

## 2019-08-20 VITALS — BP 122/70 | HR 72 | Temp 98.0°F | Wt 342.0 lb

## 2019-08-20 DIAGNOSIS — E782 Mixed hyperlipidemia: Secondary | ICD-10-CM

## 2019-08-20 DIAGNOSIS — I10 Essential (primary) hypertension: Secondary | ICD-10-CM | POA: Diagnosis not present

## 2019-08-20 DIAGNOSIS — E119 Type 2 diabetes mellitus without complications: Secondary | ICD-10-CM

## 2019-08-20 DIAGNOSIS — E1159 Type 2 diabetes mellitus with other circulatory complications: Secondary | ICD-10-CM | POA: Diagnosis not present

## 2019-08-20 MED ORDER — LOSARTAN POTASSIUM 50 MG PO TABS: 50.0000 mg | ORAL_TABLET | Freq: Every day | ORAL | 3 refills | Status: DC

## 2019-08-20 MED ORDER — OMEGA-3-ACID ETHYL ESTERS 1 G PO CAPS: 1.0000 | ORAL_CAPSULE | Freq: Every day | ORAL | 3 refills | Status: DC

## 2019-08-20 MED ORDER — SPIRONOLACTONE 50 MG PO TABS: 50.0000 mg | ORAL_TABLET | Freq: Every day | ORAL | 3 refills | Status: DC

## 2019-08-20 MED ORDER — METFORMIN HCL ER 500 MG PO TB24: 500.0000 mg | ORAL_TABLET | Freq: Two times a day (BID) | ORAL | 3 refills | Status: DC

## 2019-08-20 MED ORDER — LOVASTATIN 10 MG PO TABS: 10.0000 mg | ORAL_TABLET | Freq: Every day | ORAL | 3 refills | Status: DC

## 2019-08-20 NOTE — Progress Notes (Signed)
BP 122/70   Pulse 72   Temp 98 F (36.7 C) (Oral)   Wt (!) 342 lb 0.4 oz (155.1 kg)   SpO2 97%   BMI 60.59 kg/m    Subjective:    Patient ID: Sharon Ware, female    DOB: Sep 28, 1950, 69 y.o.   MRN: [id number]  HPI: Sharon Ware is a 69 y.o. female  Chief Complaint  Patient presents with  . Diabetes  . Hypertension  . Hyperlipidemia   Here today for 6 month f/u chronic conditions. Taking medications faithfully without side effects. Trying to keep a check on her diet, cannot exercise due to chronic joint issues and typically uses wheelchair when going places. Denies CP, SOB, HAs, dizziness, claudication, low blood sugar spells.  BPs 120s/70s at home. Does not check home BSs.   Relevant past medical, surgical, family and social history reviewed and updated as indicated. Interim medical history since our last visit reviewed. Allergies and medications reviewed and updated.  Review of Systems  Per HPI unless specifically indicated above     Objective:    BP 122/70   Pulse 72   Temp 98 F (36.7 C) (Oral)   Wt (!) 342 lb 0.4 oz (155.1 kg)   SpO2 97%   BMI 60.59 kg/m   Wt Readings from Last 3 Encounters:  08/20/19 (!) 342 lb 0.4 oz (155.1 kg)  06/28/19 (!) 338 lb 12.8 oz (153.7 kg)  08/08/18 (!) 336 lb (152.4 kg)    Physical Exam Vitals and nursing note reviewed.  Constitutional:      Appearance: Normal appearance. She is not ill-appearing.  HENT:     Head: Atraumatic.  Eyes:     Extraocular Movements: Extraocular movements intact.     Conjunctiva/sclera: Conjunctivae normal.  Cardiovascular:     Rate and Rhythm: Normal rate and regular rhythm.     Heart sounds: Normal heart sounds.  Pulmonary:     Effort: Pulmonary effort is normal.     Breath sounds: Normal breath sounds.  Musculoskeletal:     Cervical back: Normal range of motion and neck supple.     Comments: In wheelchair, but able to move all 4 extremities  Skin:    General: Skin is warm and dry.   Neurological:     Mental Status: She is alert and oriented to person, place, and time.  Psychiatric:        Mood and Affect: Mood normal.        Thought Content: Thought content normal.        Judgment: Judgment normal.     Results for orders placed or performed in visit on 08/20/19  Comprehensive metabolic panel  Result Value Ref Range   Glucose 131 (H) 65 - 99 mg/dL   BUN 28 (H) 8 - 27 mg/dL   Creatinine, Ser 0.82 0.57 - 1.00 mg/dL   GFR calc non Af Amer 73 >59 mL/min/1.73   GFR calc Af Amer 84 >59 mL/min/1.73   BUN/Creatinine Ratio 34 (H) 12 - 28   Sodium 140 134 - 144 mmol/L   Potassium 5.0 3.5 - 5.2 mmol/L   Chloride 100 96 - 106 mmol/L   CO2 23 20 - 29 mmol/L   Calcium 9.6 8.7 - 10.3 mg/dL   Total Protein 6.9 6.0 - 8.5 g/dL   Albumin 4.4 3.8 - 4.8 g/dL   Globulin, Total 2.5 1.5 - 4.5 g/dL   Albumin/Globulin Ratio 1.8 1.2 - 2.2   Bilirubin Total 0.4 0.0 -  1.2 mg/dL   Alkaline Phosphatase 62 48 - 121 IU/L   AST 17 0 - 40 IU/L   ALT 25 0 - 32 IU/L  Lipid Panel w/o Chol/HDL Ratio  Result Value Ref Range   Cholesterol, Total 165 100 - 199 mg/dL   Triglycerides 164 (H) 0 - 149 mg/dL   HDL 47 >39 mg/dL   VLDL Cholesterol Cal 28 5 - 40 mg/dL   LDL Chol Calc (NIH) 90 0 - 99 mg/dL  HgB A1c  Result Value Ref Range   Hgb A1c MFr Bld 6.6 (H) 4.8 - 5.6 %   Est. average glucose Bld gHb Est-mCnc 143 mg/dL      Assessment & Plan:   Problem List Items Addressed This Visit      Cardiovascular and Mediastinum   Hypertension associated with diabetes ([place]) - Primary    Stable and well controlled, continue current regimen      Relevant Medications   spironolactone (ALDACTONE) 50 MG tablet   omega-3 acid ethyl esters (LOVAZA) 1 g capsule   metFORMIN (GLUCOPHAGE-XR) 500 MG 24 hr tablet   lovastatin (MEVACOR) 10 MG tablet   losartan (COZAAR) 50 MG tablet   Other Relevant Orders   Comprehensive metabolic panel (Completed)     Endocrine   Diabetes mellitus without  complication (HCC)    Recheck A1C, adjust as needed. Continue current regimen and lifestyle modifications      Relevant Medications   metFORMIN (GLUCOPHAGE-XR) 500 MG 24 hr tablet   lovastatin (MEVACOR) 10 MG tablet   losartan (COZAAR) 50 MG tablet   Other Relevant Orders   HgB A1c (Completed)     Other   Morbid obesity (HCC)    Diet and exercise reviewed      Relevant Medications   metFORMIN (GLUCOPHAGE-XR) 500 MG 24 hr tablet   Hyperlipidemia    Recheck lipids, adjust as needed. Continue lifestyle modifications      Relevant Medications   spironolactone (ALDACTONE) 50 MG tablet   omega-3 acid ethyl esters (LOVAZA) 1 g capsule   lovastatin (MEVACOR) 10 MG tablet   losartan (COZAAR) 50 MG tablet   Other Relevant Orders   Lipid Panel w/o Chol/HDL Ratio (Completed)       Follow up plan: Return in about 6 months (around 02/20/2020) for 6 month f/u.

## 2019-08-21 LAB — COMPREHENSIVE METABOLIC PANEL
ALT: 25 IU/L (ref 0–32)
AST: 17 IU/L (ref 0–40)
Albumin/Globulin Ratio: 1.8 (ref 1.2–2.2)
Albumin: 4.4 g/dL (ref 3.8–4.8)
Alkaline Phosphatase: 62 IU/L (ref 48–121)
BUN/Creatinine Ratio: 34 — ABNORMAL HIGH (ref 12–28)
BUN: 28 mg/dL — ABNORMAL HIGH (ref 8–27)
Bilirubin Total: 0.4 mg/dL (ref 0.0–1.2)
CO2: 23 mmol/L (ref 20–29)
Calcium: 9.6 mg/dL (ref 8.7–10.3)
Chloride: 100 mmol/L (ref 96–106)
Creatinine, Ser: 0.82 mg/dL (ref 0.57–1.00)
GFR calc Af Amer: 84 mL/min/{1.73_m2} (ref >59)
GFR calc non Af Amer: 73 mL/min/{1.73_m2} (ref >59)
Globulin, Total: 2.5 g/dL (ref 1.5–4.5)
Glucose: 131 mg/dL — ABNORMAL HIGH (ref 65–99)
Potassium: 5 mmol/L (ref 3.5–5.2)
Sodium: 140 mmol/L (ref 134–144)
Total Protein: 6.9 g/dL (ref 6.0–8.5)

## 2019-08-21 LAB — LIPID PANEL W/O CHOL/HDL RATIO
Cholesterol, Total: 165 mg/dL (ref 100–199)
HDL: 47 mg/dL (ref >39)
LDL Chol Calc (NIH): 90 mg/dL (ref 0–99)
Triglycerides: 164 mg/dL — ABNORMAL HIGH (ref 0–149)
VLDL Cholesterol Cal: 28 mg/dL (ref 5–40)

## 2019-08-21 LAB — HEMOGLOBIN A1C
Est. average glucose Bld gHb Est-mCnc: 143 mg/dL
Hgb A1c MFr Bld: 6.6 % — ABNORMAL HIGH (ref 4.8–5.6)

## 2019-08-21 NOTE — Assessment & Plan Note (Signed)
Recheck A1C, adjust as needed. Continue current regimen and lifestyle modifications 

## 2019-08-21 NOTE — Assessment & Plan Note (Signed)
Diet and exercise reviewed 

## 2019-08-21 NOTE — Assessment & Plan Note (Signed)
Recheck lipids, adjust as needed. Continue lifestyle modifications 

## 2019-08-21 NOTE — Assessment & Plan Note (Signed)
Stable and well controlled, continue current regimen 

## 2019-09-04 ENCOUNTER — Telehealth: Payer: Self-pay | Admitting: Family Medicine

## 2019-09-04 NOTE — Telephone Encounter (Signed)
Copied from [place] [id number]. Topic: General - Other >> Sep 04, 2019 11:05 AM Keene Breath wrote: Reason for CRM: Patient called to inform the nurse or doctor that she needs a physician statement in order to be exempt from her jury duty call.  The information she was given was that she could not be exempt unless she had documentation.  Patient stated that she is not physically able to participate in jury duty.  Please advise and call patient to see what she needs to do to take care of this matter.  CB# [phone]

## 2019-09-04 NOTE — Telephone Encounter (Signed)
Routing to provider. Can this be done or does this need an appointment?

## 2019-09-05 NOTE — Telephone Encounter (Signed)
Needs appt for documentation. Can be video visit

## 2019-09-05 NOTE — Telephone Encounter (Signed)
Scheduled 6/4 8:20

## 2019-09-07 ENCOUNTER — Other Ambulatory Visit: Payer: Self-pay

## 2019-09-07 ENCOUNTER — Encounter: Payer: Self-pay | Admitting: Family Medicine

## 2019-09-07 ENCOUNTER — Ambulatory Visit (INDEPENDENT_AMBULATORY_CARE_PROVIDER_SITE_OTHER): Payer: Medicare Other | Admitting: Family Medicine

## 2019-09-07 VITALS — BP 120/71 | HR 79 | Temp 98.4°F

## 2019-09-07 DIAGNOSIS — M170 Bilateral primary osteoarthritis of knee: Secondary | ICD-10-CM | POA: Diagnosis not present

## 2019-09-07 DIAGNOSIS — G8929 Other chronic pain: Secondary | ICD-10-CM

## 2019-09-07 NOTE — Progress Notes (Signed)
   BP 120/71 Comment: left low arm  Pulse 79   Temp 98.4 F (36.9 C) (Oral)   SpO2 93%    Subjective:    Patient ID: Sharon Ware, female    DOB: 08-May-1950, 69 y.o.   MRN: [id number]  HPI: Sharon Ware is a 69 y.o. female  Chief Complaint  Patient presents with  . Paperwork    pt states won't be able to attend for judy duty   Presenting today with forms for upcoming [name] duty assignment that she requests an excuse for due to mobility issues and chronic joint pains. Is wheelchair bound for the most part and notes sitting for long periods is excruciating for her. Denies any recent changes to medical condition or other concerns today.   Relevant past medical, surgical, family and social history reviewed and updated as indicated. Interim medical history since our last visit reviewed. Allergies and medications reviewed and updated.  Review of Systems  Per HPI unless specifically indicated above     Objective:    BP 120/71 Comment: left low arm  Pulse 79   Temp 98.4 F (36.9 C) (Oral)   SpO2 93%   Wt Readings from Last 3 Encounters:  08/20/19 (!) 342 lb 0.4 oz (155.1 kg)  06/28/19 (!) 338 lb 12.8 oz (153.7 kg)  08/08/18 (!) 336 lb (152.4 kg)    Physical Exam Vitals and nursing note reviewed.  Constitutional:      Appearance: Normal appearance. She is obese. She is not ill-appearing.  HENT:     Head: Atraumatic.  Eyes:     Extraocular Movements: Extraocular movements intact.     Conjunctiva/sclera: Conjunctivae normal.  Cardiovascular:     Rate and Rhythm: Normal rate and regular rhythm.     Heart sounds: Normal heart sounds.  Pulmonary:     Effort: Pulmonary effort is normal.     Breath sounds: Normal breath sounds.  Musculoskeletal:     Cervical back: Normal range of motion and neck supple.     Comments: In wheelchair  Skin:    General: Skin is warm and dry.  Neurological:     Mental Status: She is alert and oriented to person, place, and time.    Psychiatric:        Mood and Affect: Mood normal.        Thought Content: Thought content normal.        Judgment: Judgment normal.     Results for orders placed or performed in visit on 08/20/19  HM DIABETES EYE EXAM  Result Value Ref Range   HM Diabetic Eye Exam No Retinopathy No Retinopathy      Assessment & Plan:   Problem List Items Addressed This Visit      Musculoskeletal and Integument   Arthritis of both knees - Primary    Other Visit Diagnoses    Other chronic pain          Jury duty excuse printed due to being wheelchair bound and having severe arthritis and chronic pain that will not tolerate long days of sitting.   Follow up plan: Return for as scheduled.

## 2019-09-11 ENCOUNTER — Other Ambulatory Visit: Payer: Self-pay | Admitting: Family Medicine

## 2019-09-11 DIAGNOSIS — Z1231 Encounter for screening mammogram for malignant neoplasm of breast: Secondary | ICD-10-CM

## 2019-09-20 ENCOUNTER — Ambulatory Visit
Admission: RE | Admit: 2019-09-20 | Discharge: 2019-09-20 | Disposition: A | Discharge status: Home / Self Care | Payer: Medicare Other | Source: Ambulatory Visit | Attending: Family Medicine | Admitting: Family Medicine

## 2019-09-20 DIAGNOSIS — Z1231 Encounter for screening mammogram for malignant neoplasm of breast: Secondary | ICD-10-CM

## 2020-01-11 ENCOUNTER — Ambulatory Visit: Payer: Medicare Other

## 2020-02-21 ENCOUNTER — Ambulatory Visit: Payer: Medicare Other | Admitting: Family Medicine

## 2020-02-21 ENCOUNTER — Ambulatory Visit (INDEPENDENT_AMBULATORY_CARE_PROVIDER_SITE_OTHER): Payer: Medicare Other | Admitting: Family Medicine

## 2020-02-21 ENCOUNTER — Encounter: Payer: Self-pay | Admitting: Family Medicine

## 2020-02-21 ENCOUNTER — Other Ambulatory Visit: Payer: Self-pay

## 2020-02-21 VITALS — BP 135/84 | HR 71 | Temp 98.3°F | Ht 62.0 in | Wt 339.0 lb

## 2020-02-21 DIAGNOSIS — E559 Vitamin D deficiency, unspecified: Secondary | ICD-10-CM

## 2020-02-21 DIAGNOSIS — E119 Type 2 diabetes mellitus without complications: Secondary | ICD-10-CM | POA: Diagnosis not present

## 2020-02-21 DIAGNOSIS — E782 Mixed hyperlipidemia: Secondary | ICD-10-CM

## 2020-02-21 DIAGNOSIS — E1159 Type 2 diabetes mellitus with other circulatory complications: Secondary | ICD-10-CM

## 2020-02-21 DIAGNOSIS — I152 Hypertension secondary to endocrine disorders: Secondary | ICD-10-CM | POA: Diagnosis not present

## 2020-02-21 LAB — BAYER DCA HB A1C WAIVED: HB A1C (BAYER DCA - WAIVED): 6.1 % (ref <7.0)

## 2020-02-21 NOTE — Assessment & Plan Note (Signed)
Labs drawn today. Await results. Treat as needed.  

## 2020-02-21 NOTE — Patient Instructions (Signed)
Zoster Vaccine, Recombinant injection What is this medicine? ZOSTER VACCINE (ZOS ter vak SEEN) is used to prevent shingles in adults 71 years old and over. This vaccine is not used to treat shingles or nerve pain from shingles. This medicine may be used for other purposes; ask your health care provider or pharmacist if you have questions. COMMON BRAND NAME(S): SHINGRIX What should I tell my health care provider before I take this medicine? They need to know if you have any of these conditions:  blood disorders or disease  cancer like leukemia or lymphoma  immune system problems or therapy  an unusual or allergic reaction to vaccines, other medications, foods, dyes, or preservatives  pregnant or trying to get pregnant  breast-feeding How should I use this medicine? This vaccine is for injection in a muscle. It is given by a health care professional. Talk to your pediatrician regarding the use of this medicine in children. This medicine is not approved for use in children. Overdosage: If you think you have taken too much of this medicine contact a poison control center or emergency room at once. NOTE: This medicine is only for you. Do not share this medicine with others. What if I miss a dose? Keep appointments for follow-up (booster) doses as directed. It is important not to miss your dose. Call your doctor or health care professional if you are unable to keep an appointment. What may interact with this medicine?  medicines that suppress your immune system  medicines to treat cancer  steroid medicines like prednisone or cortisone This list may not describe all possible interactions. Give your health care provider a list of all the medicines, herbs, non-prescription drugs, or dietary supplements you use. Also tell them if you smoke, drink alcohol, or use illegal drugs. Some items may interact with your medicine. What should I watch for while using this medicine? Visit your doctor for  regular check ups. This vaccine, like all vaccines, may not fully protect everyone. What side effects may I notice from receiving this medicine? Side effects that you should report to your doctor or health care professional as soon as possible:  allergic reactions like skin rash, itching or hives, swelling of the face, lips, or tongue  breathing problems Side effects that usually do not require medical attention (report these to your doctor or health care professional if they continue or are bothersome):  chills  headache  fever  nausea, vomiting  redness, warmth, pain, swelling or itching at site where injected  tiredness This list may not describe all possible side effects. Call your doctor for medical advice about side effects. You may report side effects to FDA at 1-800-FDA-1088. Where should I keep my medicine? This vaccine is only given in a clinic, pharmacy, doctor's office, or other health care setting and will not be stored at home. NOTE: This sheet is a summary. It may not cover all possible information. If you have questions about this medicine, talk to your doctor, pharmacist, or health care provider.  2020 Elsevier/Gold Standard (2016-11-01 13:20:30)  

## 2020-02-21 NOTE — Assessment & Plan Note (Signed)
Under good control on current regimen. Continue current regimen. Continue to monitor. Call with any concerns. Refills up to date. Labs drawn today.  

## 2020-02-21 NOTE — Assessment & Plan Note (Signed)
Under good control on current regimen with A1c of 6.1. Continue current regimen. Continue to monitor. Call with any concerns. Refills up to date. Labs drawn today.

## 2020-02-21 NOTE — Assessment & Plan Note (Signed)
Encourage diet and exercise with goal of losing 1-2 lbs per week. Continue to monitor.

## 2020-02-21 NOTE — Progress Notes (Signed)
BP 135/84   Pulse 71   Temp 98.3 F (36.8 C) (Oral)   Ht 5\' 2"  (1.575 m)   Wt (!) 339 lb (153.8 kg)   BMI 62.00 kg/m    Subjective:    Patient ID: Sharon Ware, female    DOB: [birth year], 69 y.o.   MRN: [id number]  HPI: Sharon Ware is a 69 y.o. female  Chief Complaint  Patient presents with  . Hypertension  . Hyperlipidemia  . Diabetes  . Knee Pain    pain doing great recently   DIABETES Hypoglycemic episodes:no Polydipsia/polyuria: no Visual disturbance: no Chest pain: no Paresthesias: yes- chronic due to post-herpetic neuralgia Glucose Monitoring: yes  Accucheck frequency: Not Checking Taking Insulin?: no Blood Pressure Monitoring: not checking Retinal Examination: Up to Date Foot Exam: Not Up to Date Diabetic Education: Completed Pneumovax: Up to Date Influenza: Up to Date Aspirin: yes  HYPERTENSION / HYPERLIPIDEMIA Satisfied with current treatment? yes Duration of hypertension: chronic BP monitoring frequency: not checking BP medication side effects: no Past BP meds: spironalactone, losartan Duration of hyperlipidemia: chronic Cholesterol medication side effects: no Cholesterol supplements: fish oil Past cholesterol medications: lovastatin Medication compliance: excellent compliance Aspirin: yes Recent stressors: no Recurrent headaches: no Visual changes: no Palpitations: no Dyspnea: no Chest pain: no Lower extremity edema: no Dizzy/lightheaded: no  Relevant past medical, surgical, family and social history reviewed and updated as indicated. Interim medical history since our last visit reviewed. Allergies and medications reviewed and updated.  Review of Systems  Constitutional: Negative.   Respiratory: Negative.   Cardiovascular: Negative.   Gastrointestinal: Negative.   Genitourinary: Negative.   Neurological: Negative.   Psychiatric/Behavioral: Negative.     Per HPI unless specifically indicated above     Objective:    BP  135/84   Pulse 71   Temp 98.3 F (36.8 C) (Oral)   Ht 5\' 2"  (1.575 m)   Wt (!) 339 lb (153.8 kg)   BMI 62.00 kg/m   Wt Readings from Last 3 Encounters:  02/21/20 (!) 339 lb (153.8 kg)  08/20/19 (!) 342 lb 0.4 oz (155.1 kg)  06/28/19 (!) 338 lb 12.8 oz (153.7 kg)    Physical Exam Vitals and nursing note reviewed.  Constitutional:      General: She is not in acute distress.    Appearance: Normal appearance. She is obese. She is not ill-appearing, toxic-appearing or diaphoretic.     Comments: Wheelchair bound  HENT:     Head: Normocephalic and atraumatic.     Right Ear: External ear normal.     Left Ear: External ear normal.     Nose: Nose normal.     Mouth/Throat:     Mouth: Mucous membranes are moist.     Pharynx: Oropharynx is clear.  Eyes:     General: No scleral icterus.       Right eye: No discharge.        Left eye: No discharge.     Extraocular Movements: Extraocular movements intact.     Conjunctiva/sclera: Conjunctivae normal.     Pupils: Pupils are equal, round, and reactive to light.  Cardiovascular:     Rate and Rhythm: Normal rate and regular rhythm.     Pulses: Normal pulses.     Heart sounds: Normal heart sounds. No murmur heard.  No friction rub. No gallop.   Pulmonary:     Effort: Pulmonary effort is normal. No respiratory distress.     Breath sounds: Normal breath sounds. No  stridor. No wheezing, rhonchi or rales.  Chest:     Chest wall: No tenderness.  Musculoskeletal:        General: Normal range of motion.     Cervical back: Normal range of motion and neck supple.  Skin:    General: Skin is warm and dry.     Capillary Refill: Capillary refill takes less than 2 seconds.     Coloration: Skin is not jaundiced or pale.     Findings: No bruising, erythema, lesion or rash.  Neurological:     General: No focal deficit present.     Mental Status: She is alert and oriented to person, place, and time. Mental status is at baseline.  Psychiatric:         Mood and Affect: Mood normal.        Behavior: Behavior normal.        Thought Content: Thought content normal.        Judgment: Judgment normal.     Results for orders placed or performed in visit on 08/20/19  HM DIABETES EYE EXAM  Result Value Ref Range   HM Diabetic Eye Exam No Retinopathy No Retinopathy      Assessment & Plan:   Problem List Items Addressed This Visit      Cardiovascular and Mediastinum   Hypertension associated with diabetes ([place]) - Primary    Under good control on current regimen. Continue current regimen. Continue to monitor. Call with any concerns. Refills up to date. Labs drawn today.        Relevant Orders   CBC with Differential/Platelet   Comprehensive metabolic panel   Microalbumin, Urine Waived   TSH     Endocrine   Diabetes mellitus without complication (HCC)    Under good control on current regimen with A1c of 6.1. Continue current regimen. Continue to monitor. Call with any concerns. Refills up to date. Labs drawn today.        Relevant Orders   Bayer DCA Hb A1c Waived   CBC with Differential/Platelet   Comprehensive metabolic panel   Microalbumin, Urine Waived   Urinalysis, Routine w reflex microscopic     Other   Morbid obesity (HCC)    Encourage diet and exercise with goal of losing 1-2 lbs per week. Continue to monitor.       Relevant Orders   TSH   Hyperlipidemia    Under good control on current regimen. Continue current regimen. Continue to monitor. Call with any concerns. Refills up to date. Labs drawn today.        Relevant Orders   Comprehensive metabolic panel   Lipid Panel w/o Chol/HDL Ratio   Vitamin D deficiency    Labs drawn today. Await results. Treat as needed.       Relevant Orders   VITAMIN D 25 Hydroxy (Vit-D Deficiency, Fractures)       Follow up plan: Return in about 6 months (around 08/20/2020).

## 2020-02-22 LAB — CBC WITH DIFFERENTIAL/PLATELET
Basophils Absolute: 0 10*3/uL (ref 0.0–0.2)
Basos: 1 %
EOS (ABSOLUTE): 0.4 10*3/uL (ref 0.0–0.4)
Eos: 6 %
Hematocrit: 47.7 % — ABNORMAL HIGH (ref 34.0–46.6)
Hemoglobin: 15.6 g/dL (ref 11.1–15.9)
Immature Grans (Abs): 0 10*3/uL (ref 0.0–0.1)
Immature Granulocytes: 0 %
Lymphocytes Absolute: 2.9 10*3/uL (ref 0.7–3.1)
Lymphs: 38 %
MCH: 29.5 pg (ref 26.6–33.0)
MCHC: 32.7 g/dL (ref 31.5–35.7)
MCV: 90 fL (ref 79–97)
Monocytes Absolute: 0.7 10*3/uL (ref 0.1–0.9)
Monocytes: 9 %
Neutrophils Absolute: 3.5 10*3/uL (ref 1.4–7.0)
Neutrophils: 46 %
Platelets: 235 10*3/uL (ref 150–450)
RBC: 5.28 x10E6/uL (ref 3.77–5.28)
RDW: 12.5 % (ref 11.7–15.4)
WBC: 7.6 10*3/uL (ref 3.4–10.8)

## 2020-02-22 LAB — COMPREHENSIVE METABOLIC PANEL
ALT: 23 IU/L (ref 0–32)
AST: 20 IU/L (ref 0–40)
Albumin/Globulin Ratio: 1.4 (ref 1.2–2.2)
Albumin: 4.3 g/dL (ref 3.7–4.7)
Alkaline Phosphatase: 69 IU/L (ref 44–121)
BUN/Creatinine Ratio: 30 — ABNORMAL HIGH (ref 12–28)
BUN: 23 mg/dL (ref 8–27)
Bilirubin Total: 0.4 mg/dL (ref 0.0–1.2)
CO2: 23 mmol/L (ref 20–29)
Calcium: 9.4 mg/dL (ref 8.7–10.3)
Chloride: 99 mmol/L (ref 96–106)
Creatinine, Ser: 0.77 mg/dL (ref 0.57–1.00)
GFR calc Af Amer: 90 mL/min/{1.73_m2} (ref >59)
GFR calc non Af Amer: 78 mL/min/{1.73_m2} (ref >59)
Globulin, Total: 3.1 g/dL (ref 1.5–4.5)
Glucose: 124 mg/dL — ABNORMAL HIGH (ref 65–99)
Potassium: 4.9 mmol/L (ref 3.5–5.2)
Sodium: 139 mmol/L (ref 134–144)
Total Protein: 7.4 g/dL (ref 6.0–8.5)

## 2020-02-22 LAB — LIPID PANEL W/O CHOL/HDL RATIO
Cholesterol, Total: 165 mg/dL (ref 100–199)
HDL: 39 mg/dL — ABNORMAL LOW (ref >39)
LDL Chol Calc (NIH): 95 mg/dL (ref 0–99)
Triglycerides: 179 mg/dL — ABNORMAL HIGH (ref 0–149)
VLDL Cholesterol Cal: 31 mg/dL (ref 5–40)

## 2020-02-22 LAB — TSH: TSH: 1.66 u[IU]/mL (ref 0.450–4.500)

## 2020-02-22 LAB — VITAMIN D 25 HYDROXY (VIT D DEFICIENCY, FRACTURES): Vit D, 25-Hydroxy: 66.8 ng/mL (ref 30.0–100.0)

## 2020-04-09 ENCOUNTER — Other Ambulatory Visit: Payer: Self-pay

## 2020-04-09 MED ORDER — LOSARTAN POTASSIUM 50 MG PO TABS: 50.0000 mg | ORAL_TABLET | Freq: Every day | ORAL | 1 refills | Status: DC

## 2020-04-09 MED ORDER — METFORMIN HCL ER 500 MG PO TB24: 500.0000 mg | ORAL_TABLET | Freq: Two times a day (BID) | ORAL | 1 refills | Status: DC

## 2020-04-09 MED ORDER — LOVASTATIN 10 MG PO TABS: 10.0000 mg | ORAL_TABLET | Freq: Every day | ORAL | 1 refills | Status: DC

## 2020-04-09 MED ORDER — SPIRONOLACTONE 50 MG PO TABS: 50.0000 mg | ORAL_TABLET | Freq: Every day | ORAL | 1 refills | Status: DC

## 2020-04-09 NOTE — Telephone Encounter (Signed)
Pt stated all medication need to [facility] [place] Pt would like all medication be refilled for 3 month due to new RX insurance plan .

## 2020-04-09 NOTE — Telephone Encounter (Signed)
Patient requesting to have all medications [facility] [place]. Last seen in October and has 6 month appt in May.

## 2020-04-09 NOTE — Addendum Note (Signed)
Addended by: Pablo Ledger on: 04/09/2020 01:42 PM   Modules accepted: Orders

## 2020-05-30 ENCOUNTER — Ambulatory Visit: Payer: Self-pay | Admitting: *Deleted

## 2020-05-30 ENCOUNTER — Ambulatory Visit: Payer: Medicare Other | Admitting: Nurse Practitioner

## 2020-05-30 NOTE — Telephone Encounter (Signed)
Scheduled for Monday 2/28 since no availability and she has bcbs bonita advised her that if she feels worse may need to go to [facility]

## 2020-05-30 NOTE — Telephone Encounter (Signed)
Per initial encounter, "Pt states she has cloudy urine, pain and pressure with urinating, and thinks she has a UTI. No appts at [facility] today,. I advised pt of this, and she said she heard on our introduction she can speak with a nurse concerning her sx and that is what she would like to do.; her symptoms"; contacted pt regarding her symptoms; they started 05/28/20; recommendations made per nurse triage protocol; she verbalized understanding; there is no availability in the office; pt transferred to [facility] for scheduling; she is seen by Jon Billings, [facility].  Reason for Disposition . Age > 50 years  Answer Assessment - Initial Assessment Questions 1. SEVERITY: "How bad is the pain?"  (e.g., Scale 1-10; mild, moderate, or severe)   - MILD (1-3): complains slightly about urination hurting   - MODERATE (4-7): interferes with normal activities     - SEVERE (8-10): excruciating, unwilling or unable to urinate because of the pain      <1 out of 10 2. FREQUENCY: "How many times have you had painful urination today?"     Twice on 05/30/20 3. PATTERN: "Is pain present every time you urinate or just sometimes?"      yes 4. ONSET: "When did the painful urination start?"     05/28/20 5. FEVER: "Do you have a fever?" If Yes, ask: "What is your temperature, how was it measured, and when did it start?"   no 6. PAST UTI: "Have you had a urine infection before?" If Yes, ask: "When was the last time?" and "What happened that time?"     Yes 3 years ago 7. CAUSE: "What do you think is causing the painful urination?"  (e.g., UTI, scratch, Herpes sore)   ?UTI 8. OTHER SYMPTOMS: "Do you have any other symptoms?" (e.g., flank pain, vaginal discharge, genital sores, urgency, blood in urine) Cloudy urine, urinary pressure 9. PREGNANCY: "Is there any chance you are pregnant?" "When was your last menstrual period?"     n/a  Protocols used: [place]

## 2020-06-02 ENCOUNTER — Ambulatory Visit (INDEPENDENT_AMBULATORY_CARE_PROVIDER_SITE_OTHER): Payer: Medicare Other | Admitting: Family Medicine

## 2020-06-02 ENCOUNTER — Telehealth: Payer: Self-pay

## 2020-06-02 ENCOUNTER — Encounter: Payer: Self-pay | Admitting: Family Medicine

## 2020-06-02 ENCOUNTER — Other Ambulatory Visit: Payer: Self-pay

## 2020-06-02 VITALS — BP 128/82 | HR 69 | Temp 97.9°F | Wt 345.0 lb

## 2020-06-02 DIAGNOSIS — N3001 Acute cystitis with hematuria: Secondary | ICD-10-CM | POA: Diagnosis not present

## 2020-06-02 DIAGNOSIS — R300 Dysuria: Secondary | ICD-10-CM | POA: Diagnosis not present

## 2020-06-02 LAB — URINALYSIS, ROUTINE W REFLEX MICROSCOPIC
Bilirubin, UA: NEGATIVE
Glucose, UA: NEGATIVE
Ketones, UA: NEGATIVE
Nitrite, UA: NEGATIVE
Specific Gravity, UA: 1.02 (ref 1.005–1.030)
Urobilinogen, Ur: 0.2 mg/dL (ref 0.2–1.0)
pH, UA: 5.5 (ref 5.0–7.5)

## 2020-06-02 LAB — MICROSCOPIC EXAMINATION: WBC, UA: >30 /hpf — AB (ref 0–5)

## 2020-06-02 MED ORDER — NITROFURANTOIN MONOHYD MACRO 100 MG PO CAPS: 100.0000 mg | ORAL_CAPSULE | Freq: Two times a day (BID) | ORAL | 0 refills | Status: DC

## 2020-06-02 NOTE — Telephone Encounter (Signed)
I did not. I'm happy to see her if she doesn't get along with her new PCP.

## 2020-06-02 NOTE — Telephone Encounter (Signed)
Pt is wanting to have Dr Wynetta Emery as pcp, states that when [facility] left Dr Wynetta Emery agreed to take her on. Please advise.

## 2020-06-02 NOTE — Progress Notes (Signed)
BP 128/82   Pulse 69   Temp 97.9 F (36.6 C)   Wt (!) 345 lb (156.5 kg)   SpO2 96%   BMI 63.10 kg/m    Subjective:    Patient ID: Sharon Ware, female    DOB: 1950/07/31, 70 y.o.   MRN: [id number]  HPI: Sharon Ware is a 70 y.o. female  Chief Complaint  Patient presents with  . Urinary Tract Infection    Patient states for about a week she is having burning with urination and cloudy urine    URINARY SYMPTOMS Duration: 6 days Dysuria: burning Urinary frequency: no Urgency: no Small volume voids: no Symptom severity: moderate Urinary incontinence: yes Foul odor: no Hematuria: no Abdominal pain: no Back pain: no Suprapubic pain/pressure: no Flank pain: no Fever:  no Vomiting: no Relief with cranberry juice: no Relief with pyridium: no Status: stable Previous urinary tract infection: yes Recurrent urinary tract infection: no History of sexually transmitted disease: no Vaginal discharge: no Treatments attempted: cranberry and increasing fluids   Relevant past medical, surgical, family and social history reviewed and updated as indicated. Interim medical history since our last visit reviewed. Allergies and medications reviewed and updated.  Review of Systems  Constitutional: Negative.   Respiratory: Negative.   Cardiovascular: Negative.   Gastrointestinal: Negative.   Genitourinary: Positive for dysuria, frequency and urgency. Negative for decreased urine volume, difficulty urinating, dyspareunia, enuresis, flank pain, genital sores, hematuria, menstrual problem, pelvic pain, vaginal bleeding, vaginal discharge and vaginal pain.  Musculoskeletal: Negative.   Psychiatric/Behavioral: Negative.     Per HPI unless specifically indicated above     Objective:    BP 128/82   Pulse 69   Temp 97.9 F (36.6 C)   Wt (!) 345 lb (156.5 kg)   SpO2 96%   BMI 63.10 kg/m   Wt Readings from Last 3 Encounters:  06/02/20 (!) 345 lb (156.5 kg)  02/21/20 (!) 339  lb (153.8 kg)  08/20/19 (!) 342 lb 0.4 oz (155.1 kg)    Physical Exam Vitals and nursing note reviewed.  Constitutional:      General: She is not in acute distress.    Appearance: Normal appearance. She is not ill-appearing, toxic-appearing or diaphoretic.     Comments: Wheelchair bound  HENT:     Head: Normocephalic and atraumatic.     Right Ear: External ear normal.     Left Ear: External ear normal.     Nose: Nose normal.     Mouth/Throat:     Mouth: Mucous membranes are moist.     Pharynx: Oropharynx is clear.  Eyes:     General: No scleral icterus.       Right eye: No discharge.        Left eye: No discharge.     Extraocular Movements: Extraocular movements intact.     Conjunctiva/sclera: Conjunctivae normal.     Pupils: Pupils are equal, round, and reactive to light.  Cardiovascular:     Rate and Rhythm: Normal rate and regular rhythm.     Pulses: Normal pulses.     Heart sounds: Normal heart sounds. No murmur heard. No friction rub. No gallop.   Pulmonary:     Effort: Pulmonary effort is normal. No respiratory distress.     Breath sounds: Normal breath sounds. No stridor. No wheezing, rhonchi or rales.  Chest:     Chest wall: No tenderness.  Musculoskeletal:        General: Normal range of motion.  Cervical back: Normal range of motion and neck supple.  Skin:    General: Skin is warm and dry.     Capillary Refill: Capillary refill takes less than 2 seconds.     Coloration: Skin is not jaundiced or pale.     Findings: No bruising, erythema, lesion or rash.  Neurological:     General: No focal deficit present.     Mental Status: She is alert and oriented to person, place, and time. Mental status is at baseline.  Psychiatric:        Mood and Affect: Mood normal.        Behavior: Behavior normal.        Thought Content: Thought content normal.        Judgment: Judgment normal.     Results for orders placed or performed in visit on 02/21/20  Bayer DCA Hb A1c  Waived  Result Value Ref Range   HB A1C (BAYER DCA - WAIVED) 6.1 <7.0 %  CBC with Differential/Platelet  Result Value Ref Range   WBC 7.6 3.4 - 10.8 x10E3/uL   RBC 5.28 3.77 - 5.28 x10E6/uL   Hemoglobin 15.6 11.1 - 15.9 g/dL   Hematocrit 47.7 (H) 34.0 - 46.6 %   MCV 90 79 - 97 fL   MCH 29.5 26.6 - 33.0 pg   MCHC 32.7 31.5 - 35.7 g/dL   RDW 12.5 11.7 - 15.4 %   Platelets 235 150 - 450 x10E3/uL   Neutrophils 46 Not Estab. %   Lymphs 38 Not Estab. %   Monocytes 9 Not Estab. %   Eos 6 Not Estab. %   Basos 1 Not Estab. %   Neutrophils Absolute 3.5 1.4 - 7.0 x10E3/uL   Lymphocytes Absolute 2.9 0.7 - 3.1 x10E3/uL   Monocytes Absolute 0.7 0.1 - 0.9 x10E3/uL   EOS (ABSOLUTE) 0.4 0.0 - 0.4 x10E3/uL   Basophils Absolute 0.0 0.0 - 0.2 x10E3/uL   Immature Granulocytes 0 Not Estab. %   Immature Grans (Abs) 0.0 0.0 - 0.1 x10E3/uL  Comprehensive metabolic panel  Result Value Ref Range   Glucose 124 (H) 65 - 99 mg/dL   BUN 23 8 - 27 mg/dL   Creatinine, Ser 0.77 0.57 - 1.00 mg/dL   GFR calc non Af Amer 78 >59 mL/min/1.73   GFR calc Af Amer 90 >59 mL/min/1.73   BUN/Creatinine Ratio 30 (H) 12 - 28   Sodium 139 134 - 144 mmol/L   Potassium 4.9 3.5 - 5.2 mmol/L   Chloride 99 96 - 106 mmol/L   CO2 23 20 - 29 mmol/L   Calcium 9.4 8.7 - 10.3 mg/dL   Total Protein 7.4 6.0 - 8.5 g/dL   Albumin 4.3 3.7 - 4.7 g/dL   Globulin, Total 3.1 1.5 - 4.5 g/dL   Albumin/Globulin Ratio 1.4 1.2 - 2.2   Bilirubin Total 0.4 0.0 - 1.2 mg/dL   Alkaline Phosphatase 69 44 - 121 IU/L   AST 20 0 - 40 IU/L   ALT 23 0 - 32 IU/L  Lipid Panel w/o Chol/HDL Ratio  Result Value Ref Range   Cholesterol, Total 165 100 - 199 mg/dL   Triglycerides 179 (H) 0 - 149 mg/dL   HDL 39 (L) >39 mg/dL   VLDL Cholesterol Cal 31 5 - 40 mg/dL   LDL Chol Calc (NIH) 95 0 - 99 mg/dL  TSH  Result Value Ref Range   TSH 1.660 0.450 - 4.500 uIU/mL  VITAMIN D 25 Hydroxy (Vit-D Deficiency,  Fractures)  Result Value Ref Range   Vit D,  25-Hydroxy 66.8 30.0 - 100.0 ng/mL      Assessment & Plan:   Problem List Items Addressed This Visit   None   Visit Diagnoses    Acute cystitis with hematuria    -  Primary   WIll treat with nitrofurantoin- await culture. Call with any concerns.    Relevant Orders   Urine Culture   Dysuria       3+ Leuks   Relevant Orders   Urinalysis, Routine w reflex microscopic       Follow up plan: Return if symptoms worsen or fail to improve.

## 2020-06-02 NOTE — Telephone Encounter (Signed)
I'm happy to see patient if she does not get along with new provider, but I did not agree to taking on this patient.

## 2020-06-02 NOTE — Telephone Encounter (Signed)
Called pt to relay Dr Kimarie Coor's message no answer left vm 

## 2020-06-02 NOTE — Telephone Encounter (Signed)
Pt called back, reports that she wants to continue care with Dr. Wynetta Emery because she has seen her twice already. Does not want to see new PCP, please advise

## 2020-06-04 LAB — URINE CULTURE

## 2020-06-05 ENCOUNTER — Other Ambulatory Visit: Payer: Self-pay | Admitting: Family Medicine

## 2020-06-05 ENCOUNTER — Telehealth: Payer: Self-pay | Admitting: Nurse Practitioner

## 2020-06-05 MED ORDER — AMOXICILLIN-POT CLAVULANATE 875-125 MG PO TABS
1.0000 | ORAL_TABLET | Freq: Two times a day (BID) | ORAL | 0 refills | Status: DC
Start: 2020-06-05 — End: 2020-06-24

## 2020-06-05 NOTE — Telephone Encounter (Signed)
See result note.  

## 2020-06-05 NOTE — Telephone Encounter (Signed)
Pt calling regarding the appt she had recently with Dr. Wynetta Emery. She states that with the antibiotic she has not had any improvements with UTI. Pt states that her urine is still cloudy and that she has burning. PT is requesting to follow up with Dr. Wynetta Emery and have another antibiotic sent in for her. Please advise.       [id number] [address], [place] [address]  Phone: [phone] Fax: [phone]  Hours: Not open 24 hours

## 2020-06-23 ENCOUNTER — Telehealth: Payer: Self-pay

## 2020-06-23 NOTE — Telephone Encounter (Signed)
Please advise pt last seen 2/28  Copied from [place] #[id number]. Topic: General - Other >> Jun 23, 2020  9:40 AM Tessa Lerner A wrote: Reason for CRM: Patient has made contact to share that they are continuing to experience urinary discomfort  Patient would like their prescription for antibiotics to be refilled and continue taking them until they feel relief  Patient would like to continue taking amoxicillin-clavulanate (AUGMENTIN) 875-125 MG tablet and receive their prescription at their preferred pharmacy  Patient shares that they are able to submit a urine sample if needed but would prefer to just receive the prescription  Patient declined to make appointment at the time of call with agent   Please contact to advise further if needed

## 2020-06-23 NOTE — Telephone Encounter (Signed)
Appt

## 2020-06-23 NOTE — Telephone Encounter (Signed)
Called pt she scheduled virtual with Dr Neomia Dear tomorrow and will bring in sample this evening or in the morning

## 2020-06-24 ENCOUNTER — Other Ambulatory Visit: Payer: Self-pay

## 2020-06-24 ENCOUNTER — Encounter: Payer: Self-pay | Admitting: Internal Medicine

## 2020-06-24 ENCOUNTER — Telehealth (INDEPENDENT_AMBULATORY_CARE_PROVIDER_SITE_OTHER): Payer: Medicare Other | Admitting: Internal Medicine

## 2020-06-24 ENCOUNTER — Telehealth: Payer: Self-pay

## 2020-06-24 DIAGNOSIS — R399 Unspecified symptoms and signs involving the genitourinary system: Secondary | ICD-10-CM

## 2020-06-24 DIAGNOSIS — R8271 Bacteriuria: Secondary | ICD-10-CM | POA: Diagnosis not present

## 2020-06-24 LAB — URINALYSIS, ROUTINE W REFLEX MICROSCOPIC
Bilirubin, UA: NEGATIVE
Glucose, UA: NEGATIVE
Ketones, UA: NEGATIVE
Nitrite, UA: NEGATIVE
Protein,UA: NEGATIVE
Specific Gravity, UA: 1.015 (ref 1.005–1.030)
Urobilinogen, Ur: 0.2 mg/dL (ref 0.2–1.0)
pH, UA: 6 (ref 5.0–7.5)

## 2020-06-24 LAB — MICROSCOPIC EXAMINATION

## 2020-06-24 MED ORDER — CIPROFLOXACIN HCL 500 MG PO TABS
500.0000 mg | ORAL_TABLET | Freq: Two times a day (BID) | ORAL | 0 refills | Status: AC
Start: 2020-06-24 — End: 2020-06-29

## 2020-06-24 NOTE — Telephone Encounter (Signed)
Copied from [place] [id number]. Topic: General - Other >> Jun 24, 2020  2:38 PM Mcneil, Ja-Kwan wrote: Reason for CRM: Pt stated she was prescribed ciprofloxacin (CIPRO) 500 MG tablet and after reading the papers on the medication there were 5 things listed where she should not be taking the medication. Pt stated she would like to request that another medication be prescribed.

## 2020-06-24 NOTE — Progress Notes (Signed)
There were no vitals taken for this visit.   Subjective:    Patient ID: Sharon Ware, female    DOB: 1950-12-19, 70 y.o.   MRN: [id number]  HPI: Sharon Ware is a 70 y.o. female   This visit was completed via MyChart due to the restrictions of the COVID-19 pandemic. All issues as above were discussed and addressed. Physical exam was done as above through visual confirmation on MyChart. If it was felt that the patient should be evaluated in the office, they were directed there. The patient verbally consented to this visit. Location of the patient: home Location of the provider: work Those involved with this call:  Provider: Charlynne Cousins, MD  Time spent on call: 10 minutes on the phone discussing health concerns.8 minutes total spent in review of patient's record and preparation of their chart.  Urinary Tract Infection  This is a new (has cloudy urine and burning sensation x   last abx stopped sunday morning  x  1 weeks ) problem. The current episode started yesterday (28th of feb x 2 days, then got called in another abx augmentin x 10 days ). The problem occurs every urination. The quality of the pain is described as burning. The pain is mild. There has been no fever. Associated symptoms include frequency. Pertinent negatives include no chills, discharge, flank pain, hematuria, hesitancy, nausea, possible pregnancy, sweats, urgency or vomiting.    Chief Complaint  Patient presents with  . urinary symptoms    UTI started on 2/28, has had 2 different abx and still having symptoms, seemed to clear for a few days but has came back.     Relevant past medical, surgical, family and social history reviewed and updated as indicated. Interim medical history since our last visit reviewed. Allergies and medications reviewed and updated.  Review of Systems  Constitutional: Negative for activity change, appetite change, chills, fatigue and fever.  HENT: Negative for congestion, ear discharge,  ear pain and facial swelling.   Eyes: Negative for pain and itching.  Respiratory: Negative for cough, chest tightness, shortness of breath and wheezing.   Cardiovascular: Negative for chest pain, palpitations and leg swelling.  Gastrointestinal: Negative for abdominal distention, abdominal pain, blood in stool, constipation, diarrhea, nausea and vomiting.  Endocrine: Negative for cold intolerance, heat intolerance, polydipsia, polyphagia and polyuria.  Genitourinary: Positive for frequency. Negative for difficulty urinating, dysuria, flank pain, hematuria, hesitancy and urgency.  Musculoskeletal: Negative for arthralgias, gait problem, joint swelling and myalgias.  Skin: Negative for color change, rash and wound.  Neurological: Negative for dizziness, tremors, speech difficulty, weakness, light-headedness, numbness and headaches.  Hematological: Does not bruise/bleed easily.  Psychiatric/Behavioral: Negative for agitation, confusion, decreased concentration, sleep disturbance and suicidal ideas.    Per HPI unless specifically indicated above     Objective:    There were no vitals taken for this visit.  Wt Readings from Last 3 Encounters:  06/02/20 (!) 345 lb (156.5 kg)  02/21/20 (!) 339 lb (153.8 kg)  08/20/19 (!) 342 lb 0.4 oz (155.1 kg)    Physical Exam Vitals reviewed: not done sec to virtual visit.     Results for orders placed or performed in visit on 06/02/20  Urine Culture   Specimen: Urine   UR  Result Value Ref Range   Urine Culture, Routine Final report (A)    Organism ID, Bacteria Proteus mirabilis (A)    Antimicrobial Susceptibility Comment   Microscopic Examination   Urine  Result Value Ref Range  WBC, UA >30 (A) 0 - 5 /hpf   RBC 11-30 (A) 0 - 2 /hpf   Epithelial Cells (non renal) 0-10 0 - 10 /hpf   Bacteria, UA Moderate (A) None seen/Few  Urinalysis, Routine w reflex microscopic  Result Value Ref Range   Specific Gravity, UA 1.020 1.005 - 1.030   pH, UA  5.5 5.0 - 7.5   Color, UA Yellow Yellow   Appearance Ur Cloudy (A) Clear   Leukocytes,UA 3+ (A) Negative   Protein,UA 1+ (A) Negative/Trace   Glucose, UA Negative Negative   Ketones, UA Negative Negative   RBC, UA 3+ (A) Negative   Bilirubin, UA Negative Negative   Urobilinogen, Ur 0.2 0.2 - 1.0 mg/dL   Nitrite, UA Negative Negative   Microscopic Examination See below:         Proteus mirabilis 2/28  Amoxicillin/Clavulanic Acid  S  Ampicillin           S  Cefepime            S  Ceftriaxone          S  Cefuroxime           S  Ciprofloxacin         S  Ertapenem           S  Gentamicin           S  Levofloxacin          S  Meropenem           S  Nitrofurantoin         R  Piperacillin/Tazobactam    S  Tetracycline          R  Tobramycin           S  Trimethoprim/Sulfa       S    Was tx with nitrofurantoin then changed per cultures   Assessment & Plan:  1. UTi recurrent :  UA positive for leu 2+ Will start pt on ciprofloxacin  Increase fluid intake.     Problem List Items Addressed This Visit   None   Visit Diagnoses    Urinary tract infection symptoms    -  Primary   Relevant Orders   Urinalysis, Routine w reflex microscopic   Urine Culture   Bacteria in urine       Relevant Orders   Urine Culture       Follow up plan: No follow-ups on file.

## 2020-06-25 ENCOUNTER — Other Ambulatory Visit: Payer: Self-pay | Admitting: Internal Medicine

## 2020-06-25 MED ORDER — SULFAMETHOXAZOLE-TRIMETHOPRIM 800-160 MG PO TABS
1.0000 | ORAL_TABLET | Freq: Two times a day (BID) | ORAL | 0 refills | Status: AC
Start: 2020-06-25 — End: 2020-06-30

## 2020-06-25 NOTE — Telephone Encounter (Signed)
Patient informed that a new prescription of Bactrim was sent in to her pharmacy  and to [place]

## 2020-06-25 NOTE — Telephone Encounter (Signed)
Called and spoke with patient, she states that the pharmacist said that the bactrim along with the Spirolactone will cause increased potassium, very quickly.  Patient would like to wait for the culture to return to see what antibiotic will treat the infection.

## 2020-06-25 NOTE — Telephone Encounter (Signed)
Patient has returned call regarding the medication Patient's pharmacist has alerted patient (via patient's husband) to potential complications with the Bactrim as well Patient would like to be contacted to discuss further

## 2020-06-26 ENCOUNTER — Telehealth: Payer: Self-pay

## 2020-06-26 NOTE — Telephone Encounter (Signed)
Copied from [place] [id number]. Topic: General - Other >> Jun 26, 2020  4:13 PM Tessa Lerner A wrote: Reason for CRM: Patient has made contact requesting to be notified when their urine culture analysis is complete Please contact to further advise when possible

## 2020-06-27 ENCOUNTER — Telehealth: Payer: Self-pay

## 2020-06-27 NOTE — Telephone Encounter (Signed)
Spoke with patient and informed her that Dr. Wynetta Emery highly recommends taking the Cipro for her UTI, however patient wants her to go over her lab results before she takes anything.

## 2020-06-27 NOTE — Telephone Encounter (Signed)
Called and spoke with patient, she is very upset, the culture was not sent out the other day,lab did not see the culture, so they are sending it out now.  Patient is now wanting something done for the uti, she does not want to wait until Monday for the culture to return, she states that she feels like she is not getting the care that she needs here.  I informed patient that I should have caught that the culture was not ran when I spoke with her earlier this week, and apologized for missing it.   Can something be sent in for her based on the previous culture?

## 2020-06-27 NOTE — Telephone Encounter (Signed)
Informed patient that Dr. Wynetta Emery has read her chart and has advised that she take the Cipro until the culture results return.

## 2020-06-27 NOTE — Telephone Encounter (Signed)
She has no allergies to cipro. No interactions with it and her other medicines. I would advise her to take the cipro until the culture returns.

## 2020-07-02 LAB — URINE CULTURE

## 2020-07-03 ENCOUNTER — Other Ambulatory Visit: Payer: Self-pay

## 2020-07-03 ENCOUNTER — Ambulatory Visit (INDEPENDENT_AMBULATORY_CARE_PROVIDER_SITE_OTHER): Payer: Medicare Other | Admitting: Nurse Practitioner

## 2020-07-03 ENCOUNTER — Encounter: Payer: Self-pay | Admitting: Nurse Practitioner

## 2020-07-03 VITALS — BP 121/75 | HR 80 | Temp 98.3°F | Wt 345.0 lb

## 2020-07-03 DIAGNOSIS — S39012A Strain of muscle, fascia and tendon of lower back, initial encounter: Secondary | ICD-10-CM | POA: Diagnosis not present

## 2020-07-03 DIAGNOSIS — R8271 Bacteriuria: Secondary | ICD-10-CM

## 2020-07-03 LAB — URINALYSIS, ROUTINE W REFLEX MICROSCOPIC
Bilirubin, UA: NEGATIVE
Glucose, UA: NEGATIVE
Ketones, UA: NEGATIVE
Nitrite, UA: POSITIVE — AB
Protein,UA: NEGATIVE
Specific Gravity, UA: 1.015 (ref 1.005–1.030)
Urobilinogen, Ur: 0.2 mg/dL (ref 0.2–1.0)
pH, UA: 5.5 (ref 5.0–7.5)

## 2020-07-03 LAB — MICROSCOPIC EXAMINATION: RBC, Urine: >30 /hpf — AB (ref 0–2)

## 2020-07-03 MED ORDER — TIZANIDINE HCL 4 MG PO TABS
4.0000 mg | ORAL_TABLET | Freq: Three times a day (TID) | ORAL | 0 refills | Status: DC | PRN
Start: 2020-07-03 — End: 2020-12-02

## 2020-07-03 MED ORDER — METHOCARBAMOL 500 MG PO TABS: 500.0000 mg | ORAL_TABLET | Freq: Three times a day (TID) | ORAL | 0 refills | Status: DC | PRN

## 2020-07-03 NOTE — Patient Instructions (Signed)
Lumbosacral Strain Lumbosacral strain is an injury that causes pain in the lower back (lumbosacral spine). This injury usually happens from overstretching the muscles or ligaments along your spine. Ligaments are cord-like tissues that connect bones to other bones. A strain can affect one or more muscles or ligaments. What are the causes? This condition may be caused by:  A hard, direct hit to the back.  Overstretching the lower back muscles. This may result from: ? A fall. ? Lifting something heavy. ? Repetitive movements such as bending or crouching. What increases the risk? The following factors may make you more likely to develop this condition:  Participating in sports or activities that involve: ? A sudden twist of the back. ? Pushing or pulling motions.  Being overweight or obese.  Having poor strength and flexibility, especially tight hamstrings or weak muscles in the back or abdomen.  Having too much of a curve in the lower back.  Having a pelvis that is tilted forward. What are the signs or symptoms? The main symptom of this condition is pain in the lower back, at the site of the strain. Pain may also be felt down one or both legs. How is this diagnosed? This condition is diagnosed based on your symptoms, your medical history, and a physical exam. During the physical exam, your health care provider may push on certain areas of your back to find the source of your pain. You may be asked to bend forward, backward, and side to side to check your pain and range of motion. You may also have imaging tests, such as X-rays and an MRI. How is this treated? This condition may be treated by:  Applying heat and cold on the affected area.  Taking medicines to help relieve pain and relax your muscles.  Taking NSAIDs, such as ibuprofen, to help reduce swelling and discomfort.  Doing stretching and strengthening exercises for your lower back. Symptoms usually improve within several  weeks of treatment. However, recovery time varies. When your symptoms improve, gradually return to your normal routine as soon as possible to reduce pain, avoid stiffness, and keep muscle strength. Follow these instructions at home: Medicines  Take over-the-counter and prescription medicines only as told by your health care provider.  Ask your health care provider if the medicine prescribed to you: ? Requires you to avoid driving or using heavy machinery. ? Can cause constipation. You may need to take these actions to prevent or treat constipation:  Drink enough fluid to keep your urine pale yellow.  Take over-the-counter or prescription medicines.  Eat foods that are high in fiber, such as beans, whole grains, and fresh fruits and vegetables.  Limit foods that are high in fat and processed sugars, such as fried or sweet foods. Managing pain, stiffness, and swelling  If directed, put ice on the injured area. To do this: ? Put ice in a plastic bag. ? Place a towel between your skin and the bag. ? Leave the ice on for 20 minutes, 2-3 times a day.  If directed, apply heat on the affected area as often as told by your health care provider. Use the heat source that your health care provider recommends, such as a moist heat pack or a heating pad. ? Place a towel between your skin and the heat source. ? Leave the heat on for 20-30 minutes. ? Remove the heat if your skin turns bright red. This is especially important if you are unable to feel pain, heat, or  cold. You may have a greater risk of getting burned.      Activity  Rest as told by your health care provider.  Do not stay in bed. Staying in bed for more than 1-2 days can delay your recovery.  Return to your normal activities as told by your health care provider. Ask your health care provider what activities are safe for you.  Avoid activities that take a lot of energy for as long as told by your health care provider.  Do  exercises as told by your health care provider. This includes stretching and strengthening exercises. General instructions  Sit up and stand up straight. Avoid leaning forward when you sit, or hunching over when you stand.  Do not use any products that contain nicotine or tobacco, such as cigarettes, e-cigarettes, and chewing tobacco. If you need help quitting, ask your health care provider.  Keep all follow-up visits as told by your health care provider. This is important. How is this prevented?  Use correct form when playing sports and lifting heavy objects.  Use good posture when sitting and standing.  Maintain a healthy weight.  Sleep on a mattress with medium firmness to support your back.  Do at least 150 minutes of moderate-intensity exercise each week, such as brisk walking or water aerobics. Try a form of exercise that takes stress off your back, such as swimming or stationary cycling.  Maintain physical fitness, including: ? Strength. ? Flexibility.   Contact a health care provider if:  Your back pain does not improve after several weeks of treatment.  Your symptoms get worse. Get help right away if:  Your back pain is severe.  You cannot stand or walk.  You have difficulty controlling when you urinate or when you have a bowel movement.  You feel nauseous or you vomit.  Your feet or legs get very cold, turn pale, or look blue.  You have numbness, tingling, weakness, or problems using your arms or legs.  You develop any of the following: ? Shortness of breath. ? Dizziness. ? Pain in your legs. ? Weakness in your buttocks or legs. Summary  Lumbosacral strain is an injury that causes pain in the lower back (lumbosacral spine).  This injury usually happens from overstretching the muscles or ligaments along your spine.  This condition may be caused by a direct hit to the lower back or by overstretching the lower back muscles.  Symptoms usually improve  within several weeks of treatment. This information is not intended to replace advice given to you by your health care provider. Make sure you discuss any questions you have with your health care provider. Document Revised: 08/15/2018 Document Reviewed: 08/15/2018 Elsevier Patient Education  [place].

## 2020-07-03 NOTE — Progress Notes (Signed)
Established Patient Office Visit  Subjective:  Patient ID: Sharon Ware, female    DOB: 1950/06/28  Age: 70 y.o. MRN: [id number]  CC:  Chief Complaint  Patient presents with  . Urinary Tract Infection    Recheck urine, finished Cipro last night.   . Back Pain    Low back started Tuesday per patient, got worse last night and this morning     HPI Sharon Ware presents for follow-up on UTI and back pain that started on Tuesday.  UTI  Symptoms improved, urine still slightly cloudy. Finished antibiotic course.  BACK PAIN  Duration: days , since Tuesday Mechanism of injury: unknown Location: low back Onset: sudden Severity: 8/10 Quality: sharp Frequency: intermittent Radiation: none Aggravating factors: movement--getting out of chair Alleviating factors: nothing Status: stable Treatments attempted: rest and APAP  Relief with NSAIDs?: No NSAIDs Taken Nighttime pain:  no Paresthesias / decreased sensation:  no Bowel / bladder incontinence:  no Fevers:  no Dysuria / urinary frequency:  no  Past Medical History:  Diagnosis Date  . Arthritis   . Cataract   . Diabetes mellitus without complication ([place])   . Hyperlipidemia   . Hypertension   . Vitamin D deficiency     Past Surgical History:  Procedure Laterality Date  . ABDOMINAL HYSTERECTOMY    . BREAST BIOPSY Right 1990s   neg  . BREAST BIOPSY Right 2000s   neg  . BREAST EXCISIONAL BIOPSY Right    neg  . EYE SURGERY    . Salpingo-oophorectomy      Family History  Problem Relation Age of Onset  . Breast cancer Mother 72  . Heart disease Father   . Cancer Paternal Grandfather   . Heart disease Paternal Grandfather     Social History   Socioeconomic History  . Marital status: Married    Spouse name: Not on file  . Number of children: Not on file  . Years of education: Not on file  . Highest education level: Not on file  Occupational History  . Occupation: retired  Tobacco Use  . Smoking  status: Never Smoker  . Smokeless tobacco: Never Used  Vaping Use  . Vaping Use: Never used  Substance and Sexual Activity  . Alcohol use: Not Currently  . Drug use: Never  . Sexual activity: Not on file  Other Topics Concern  . Not on file  Social History Narrative  . Not on file   Social Determinants of Health   Financial Resource Strain: Not on file  Food Insecurity: Not on file  Transportation Needs: Not on file  Physical Activity: Not on file  Stress: Not on file  Social Connections: Not on file  Intimate Partner Violence: Not on file    Outpatient Medications Prior to Visit  Medication Sig Dispense Refill  . acetaminophen (TYLENOL) 650 MG CR tablet Take 650 mg by mouth 2 (two) times a day.    Marland Kitchen aspirin 81 MG tablet Take 81 mg by mouth daily.    . Cholecalciferol (VITAMIN D3 PO) Take 5,000 Units by mouth daily.    Marland Kitchen GLUCOSAMINE HCL PO Take by mouth.    . losartan (COZAAR) 50 MG tablet Take 1 tablet (50 mg total) by mouth daily. 90 tablet 1  . lovastatin (MEVACOR) 10 MG tablet Take 1 tablet (10 mg total) by mouth daily. 90 tablet 1  . Menthol, Topical Analgesic, (ICY HOT EX) Apply topically.    . metFORMIN (GLUCOPHAGE-XR) 500 MG 24 hr tablet  Take 1 tablet (500 mg total) by mouth 2 (two) times daily. 180 tablet 1  . Multiple Vitamin (MULTIVITAMIN) tablet Take 1 tablet by mouth daily.    . Omega-3 Fatty Acids (FISH OIL OMEGA-3 PO) Take by mouth daily.    Marland Kitchen spironolactone (ALDACTONE) 50 MG tablet Take 1 tablet (50 mg total) by mouth daily. 90 tablet 1   No facility-administered medications prior to visit.    Allergies  Allergen Reactions  . Morphine And Related Other (See Comments)    Hypotension  . Latex Rash    ROS Review of Systems  Constitutional: Positive for fatigue. Negative for fever.  HENT: Negative.   Eyes: Negative.   Respiratory: Negative.   Cardiovascular: Negative.   Gastrointestinal: Negative.   Genitourinary: Negative.   Musculoskeletal:  Positive for back pain (acute, see HPI).  Skin: Negative.       Objective:    Physical Exam Vitals and nursing note reviewed.  Constitutional:      General: She is not in acute distress.    Appearance: Normal appearance.  HENT:     Head: Normocephalic and atraumatic.  Eyes:     Conjunctiva/sclera: Conjunctivae normal.  Neck:     Vascular: No carotid bruit.  Cardiovascular:     Rate and Rhythm: Normal rate and regular rhythm.     Pulses: Normal pulses.     Heart sounds: Normal heart sounds.  Pulmonary:     Effort: Pulmonary effort is normal.     Breath sounds: Normal breath sounds.  Abdominal:     Palpations: Abdomen is soft.     Tenderness: There is no right CVA tenderness or left CVA tenderness.  Musculoskeletal:        General: Tenderness (lower back to palpation) present.     Cervical back: Normal range of motion.     Comments: Pain with range of motion to lumbar/sacral spine.   Skin:    General: Skin is warm and dry.  Neurological:     General: No focal deficit present.     Mental Status: She is alert and oriented to person, place, and time.  Psychiatric:        Mood and Affect: Mood normal.        Behavior: Behavior normal.        Thought Content: Thought content normal.        Judgment: Judgment normal.     BP 121/75   Pulse 80   Temp 98.3 F (36.8 C) (Oral)   Wt (!) 345 lb (156.5 kg)   SpO2 96%   BMI 63.10 kg/m  Wt Readings from Last 3 Encounters:  07/03/20 (!) 345 lb (156.5 kg)  06/02/20 (!) 345 lb (156.5 kg)  02/21/20 (!) 339 lb (153.8 kg)     Lab Results  Component Value Date   TSH 1.660 02/21/2020   Lab Results  Component Value Date   WBC 7.6 02/21/2020   HGB 15.6 02/21/2020   HCT 47.7 (H) 02/21/2020   MCV 90 02/21/2020   PLT 235 02/21/2020   Lab Results  Component Value Date   NA 139 02/21/2020   K 4.9 02/21/2020   CO2 23 02/21/2020   GLUCOSE 124 (H) 02/21/2020   BUN 23 02/21/2020   CREATININE 0.77 02/21/2020   BILITOT 0.4  02/21/2020   ALKPHOS 69 02/21/2020   AST 20 02/21/2020   ALT 23 02/21/2020   PROT 7.4 02/21/2020   ALBUMIN 4.3 02/21/2020   CALCIUM 9.4 02/21/2020  Lab Results  Component Value Date   CHOL 165 02/21/2020   Lab Results  Component Value Date   HDL 39 (L) 02/21/2020   Lab Results  Component Value Date   LDLCALC 95 02/21/2020   Lab Results  Component Value Date   TRIG 179 (H) 02/21/2020    Lab Results  Component Value Date   HGBA1C 6.1 02/21/2020      Assessment & Plan:   Problem List Items Addressed This Visit   None   Visit Diagnoses    Bacteria in urine    -  Primary   Finished cipro. Symptoms resolved except for cloudy urine. U/A still showed bacteria, leukocytes, and blood. Will send for culture and treat based on results   Relevant Orders   Urinalysis, Routine w reflex microscopic   Urine Culture   Back strain, initial encounter       Acute, pain when standing x2 days. No red flags. Continue tylenol, icy hot. Tizanidine prn to pharmacy. Follow-up if symptoms don't improve or worsen.      Meds ordered this encounter  Medications  . DISCONTD: methocarbamol (ROBAXIN) 500 MG tablet    Sig: Take 1 tablet (500 mg total) by mouth every 8 (eight) hours as needed for muscle spasms.    Dispense:  30 tablet    Refill:  0  . tiZANidine (ZANAFLEX) 4 MG tablet    Sig: Take 1 tablet (4 mg total) by mouth every 8 (eight) hours as needed for muscle spasms.    Dispense:  30 tablet    Refill:  0    Follow-up: Return if symptoms worsen or fail to improve.    Charyl Dancer, NP

## 2020-07-07 ENCOUNTER — Ambulatory Visit (INDEPENDENT_AMBULATORY_CARE_PROVIDER_SITE_OTHER): Payer: Medicare Other

## 2020-07-07 ENCOUNTER — Telehealth: Payer: Self-pay | Admitting: *Deleted

## 2020-07-07 ENCOUNTER — Other Ambulatory Visit: Payer: Self-pay

## 2020-07-07 DIAGNOSIS — R8271 Bacteriuria: Secondary | ICD-10-CM

## 2020-07-07 LAB — URINE CULTURE

## 2020-07-07 MED ORDER — CEFTRIAXONE SODIUM 1 G IJ SOLR
1.0000 g | Freq: Once | INTRAMUSCULAR | Status: AC
  Administered 2020-07-07: 1 g via INTRAMUSCULAR

## 2020-07-07 MED ORDER — CEPHALEXIN 500 MG PO CAPS: 500.0000 mg | ORAL_CAPSULE | Freq: Three times a day (TID) | ORAL | 0 refills | Status: DC

## 2020-07-07 NOTE — Telephone Encounter (Signed)
Opened in error, please see results note.

## 2020-07-07 NOTE — Addendum Note (Signed)
Addended by: Vance Peper A on: 07/07/2020 08:57 AM   Modules accepted: Orders

## 2020-07-25 ENCOUNTER — Encounter: Payer: Self-pay | Admitting: Family Medicine

## 2020-07-25 LAB — HM DIABETES EYE EXAM

## 2020-08-22 ENCOUNTER — Telehealth: Payer: Self-pay

## 2020-08-22 ENCOUNTER — Ambulatory Visit (INDEPENDENT_AMBULATORY_CARE_PROVIDER_SITE_OTHER): Payer: Medicare Other | Admitting: Family Medicine

## 2020-08-22 ENCOUNTER — Encounter: Payer: Self-pay | Admitting: Family Medicine

## 2020-08-22 ENCOUNTER — Other Ambulatory Visit: Payer: Self-pay

## 2020-08-22 VITALS — BP 115/74 | HR 74 | Temp 98.1°F | Wt 343.2 lb

## 2020-08-22 DIAGNOSIS — E559 Vitamin D deficiency, unspecified: Secondary | ICD-10-CM | POA: Diagnosis not present

## 2020-08-22 DIAGNOSIS — E1159 Type 2 diabetes mellitus with other circulatory complications: Secondary | ICD-10-CM

## 2020-08-22 DIAGNOSIS — I152 Hypertension secondary to endocrine disorders: Secondary | ICD-10-CM | POA: Diagnosis not present

## 2020-08-22 DIAGNOSIS — E782 Mixed hyperlipidemia: Secondary | ICD-10-CM

## 2020-08-22 DIAGNOSIS — E119 Type 2 diabetes mellitus without complications: Secondary | ICD-10-CM | POA: Diagnosis not present

## 2020-08-22 DIAGNOSIS — Z1211 Encounter for screening for malignant neoplasm of colon: Secondary | ICD-10-CM

## 2020-08-22 LAB — URINALYSIS, ROUTINE W REFLEX MICROSCOPIC
Bilirubin, UA: NEGATIVE
Glucose, UA: NEGATIVE
Ketones, UA: NEGATIVE
Nitrite, UA: POSITIVE — AB
Protein,UA: NEGATIVE
Specific Gravity, UA: 1.01 (ref 1.005–1.030)
Urobilinogen, Ur: 0.2 mg/dL (ref 0.2–1.0)
pH, UA: 5 (ref 5.0–7.5)

## 2020-08-22 LAB — MICROSCOPIC EXAMINATION

## 2020-08-22 LAB — BAYER DCA HB A1C WAIVED: HB A1C (BAYER DCA - WAIVED): 7 % — ABNORMAL HIGH (ref <7.0)

## 2020-08-22 MED ORDER — METFORMIN HCL ER 500 MG PO TB24: 500.0000 mg | ORAL_TABLET | Freq: Two times a day (BID) | ORAL | 1 refills | Status: DC

## 2020-08-22 MED ORDER — LOVASTATIN 10 MG PO TABS: 10.0000 mg | ORAL_TABLET | Freq: Every day | ORAL | 1 refills | Status: DC

## 2020-08-22 MED ORDER — LOSARTAN POTASSIUM 50 MG PO TABS: 50.0000 mg | ORAL_TABLET | Freq: Every day | ORAL | 1 refills | Status: DC

## 2020-08-22 MED ORDER — SPIRONOLACTONE 50 MG PO TABS: 50.0000 mg | ORAL_TABLET | Freq: Every day | ORAL | 1 refills | Status: DC

## 2020-08-22 NOTE — Assessment & Plan Note (Signed)
A1c up slightly at 7.0- has not been eating well. Will continue current regimen and recheck 3 months. Call with any concerns. Continue to monitor.

## 2020-08-22 NOTE — Telephone Encounter (Signed)
-----   Message from Valerie Roys, DO sent at 08/22/2020 10:33 AM EDT ----- Cologuard please

## 2020-08-22 NOTE — Progress Notes (Signed)
BP 115/74   Pulse 74   Temp 98.1 F (36.7 C)   Wt (!) 343 lb 3.2 oz (155.7 kg)   SpO2 93%   BMI 62.77 kg/m    Subjective:    Patient ID: Sharon Ware, female    DOB: [birth year], 70 y.o.   MRN: [id number]  HPI: [name] is a 70 y.o. female  Chief Complaint  Patient presents with  . Diabetes  . Hypertension  . Hyperlipidemia   DIABETES Hypoglycemic episodes:no Polydipsia/polyuria: no Visual disturbance: no Chest pain: no Paresthesias: no Glucose Monitoring: no  Accucheck frequency: Not Checking Taking Insulin?: no Blood Pressure Monitoring: not checking Retinal Examination: Up to Date Foot Exam: Up to Date Diabetic Education: Completed Pneumovax: Up to Date Influenza: Up to Date Aspirin: yes  HYPERTENSION / HYPERLIPIDEMIA Satisfied with current treatment? yes Duration of hypertension: chronic BP monitoring frequency: not checking BP medication side effects: no Past BP meds: losartan Duration of hyperlipidemia: chronic Cholesterol medication side effects: no Cholesterol supplements: none Past cholesterol medications: lovastatin Medication compliance: excellent compliance Aspirin: no Recent stressors: no Recurrent headaches: no Visual changes: no Palpitations: no Dyspnea: no Chest pain: no Lower extremity edema: no Dizzy/lightheaded: no   Relevant past medical, surgical, family and social history reviewed and updated as indicated. Interim medical history since our last visit reviewed. Allergies and medications reviewed and updated.  Review of Systems  Constitutional: Negative.   Respiratory: Negative.   Cardiovascular: Negative.   Gastrointestinal: Negative.   Musculoskeletal: Negative.   Psychiatric/Behavioral: Negative.     Per HPI unless specifically indicated above     Objective:    BP 115/74   Pulse 74   Temp 98.1 F (36.7 C)   Wt (!) 343 lb 3.2 oz (155.7 kg)   SpO2 93%   BMI 62.77 kg/m   Wt Readings from Last 3  Encounters:  08/22/20 (!) 343 lb 3.2 oz (155.7 kg)  07/03/20 (!) 345 lb (156.5 kg)  06/02/20 (!) 345 lb (156.5 kg)    Physical Exam Vitals and nursing note reviewed.  Constitutional:      General: She is not in acute distress.    Appearance: Normal appearance. She is obese. She is not ill-appearing, toxic-appearing or diaphoretic.  HENT:     Head: Normocephalic and atraumatic.     Right Ear: External ear normal.     Left Ear: External ear normal.     Nose: Nose normal.     Mouth/Throat:     Mouth: Mucous membranes are moist.     Pharynx: Oropharynx is clear.  Eyes:     General: No scleral icterus.       Right eye: No discharge.        Left eye: No discharge.     Extraocular Movements: Extraocular movements intact.     Conjunctiva/sclera: Conjunctivae normal.     Pupils: Pupils are equal, round, and reactive to light.  Cardiovascular:     Rate and Rhythm: Normal rate and regular rhythm.     Pulses: Normal pulses.     Heart sounds: Normal heart sounds. No murmur heard. No friction rub. No gallop.   Pulmonary:     Effort: Pulmonary effort is normal. No respiratory distress.     Breath sounds: Normal breath sounds. No stridor. No wheezing, rhonchi or rales.  Chest:     Chest wall: No tenderness.  Musculoskeletal:        General: Normal range of motion.     Cervical back:  Normal range of motion and neck supple.  Skin:    General: Skin is warm and dry.     Capillary Refill: Capillary refill takes less than 2 seconds.     Coloration: Skin is not jaundiced or pale.     Findings: No bruising, erythema, lesion or rash.  Neurological:     General: No focal deficit present.     Mental Status: She is alert and oriented to person, place, and time. Mental status is at baseline.  Psychiatric:        Mood and Affect: Mood normal.        Behavior: Behavior normal.        Thought Content: Thought content normal.        Judgment: Judgment normal.     Results for orders placed or  performed in visit on 07/30/20  HM DIABETES EYE EXAM  Result Value Ref Range   HM Diabetic Eye Exam No Retinopathy No Retinopathy      Assessment & Plan:   Problem List Items Addressed This Visit      Cardiovascular and Mediastinum   Hypertension associated with diabetes ([place]) - Primary    Under good control on current regimen. Continue current regimen. Continue to monitor. Call with any concerns. Refills given. Labs drawn today.       Relevant Medications   spironolactone (ALDACTONE) 50 MG tablet   metFORMIN (GLUCOPHAGE-XR) 500 MG 24 hr tablet   lovastatin (MEVACOR) 10 MG tablet   losartan (COZAAR) 50 MG tablet   Other Relevant Orders   CBC with Differential/Platelet   Comprehensive metabolic panel     Endocrine   Diabetes mellitus without complication ([facility])    G6Y up slightly at 7.0- has not been eating well. Will continue current regimen and recheck 3 months. Call with any concerns. Continue to monitor.       Relevant Medications   metFORMIN (GLUCOPHAGE-XR) 500 MG 24 hr tablet   lovastatin (MEVACOR) 10 MG tablet   losartan (COZAAR) 50 MG tablet   Other Relevant Orders   Bayer DCA Hb A1c Waived   CBC with Differential/Platelet   Comprehensive metabolic panel   Urinalysis, Routine w reflex microscopic     Other   Morbid obesity ([facility])    Encouraged diet and exercise with goal of losing 1-2lbs per week. Call with any concerns.       Relevant Medications   metFORMIN (GLUCOPHAGE-XR) 500 MG 24 hr tablet   Hyperlipidemia    Under good control on current regimen. Continue current regimen. Continue to monitor. Call with any concerns. Refills given.        Relevant Medications   spironolactone (ALDACTONE) 50 MG tablet   lovastatin (MEVACOR) 10 MG tablet   losartan (COZAAR) 50 MG tablet   Other Relevant Orders   CBC with Differential/Platelet   Comprehensive metabolic panel   Lipid Panel w/o Chol/HDL Ratio   Vitamin D deficiency    Rechecking labs today. Await  results. Treat as needed.       Relevant Orders   CBC with Differential/Platelet   Comprehensive metabolic panel   VITAMIN D 25 Hydroxy (Vit-D Deficiency, Fractures)       Follow up plan: Return in about 3 months (around 11/22/2020).

## 2020-08-22 NOTE — Assessment & Plan Note (Signed)
Encouraged diet and exercise with goal of losing 1-2lbs per week. Call with any concerns.  

## 2020-08-22 NOTE — Assessment & Plan Note (Signed)
Under good control on current regimen. Continue current regimen. Continue to monitor. Call with any concerns. Refills given.   

## 2020-08-22 NOTE — Addendum Note (Signed)
Addended by: Valerie Roys on: 08/22/2020 10:33 AM   Modules accepted: Orders

## 2020-08-22 NOTE — Telephone Encounter (Signed)
Cologuard completed and placed in provider's folder for signature.

## 2020-08-22 NOTE — Assessment & Plan Note (Signed)
Under good control on current regimen. Continue current regimen. Continue to monitor. Call with any concerns. Refills given. Labs drawn today.   

## 2020-08-22 NOTE — Telephone Encounter (Signed)
Pt asked if Dr.Johnson would be her PCP pt stated she would like to be seen by Dr.Johson instead of new PCP as she states she has built a relationship  with Dr.johnson after Apolonio Schneiders left. Please advise.

## 2020-08-22 NOTE — Assessment & Plan Note (Signed)
Rechecking labs today. Await results. Treat as needed.  °

## 2020-08-23 LAB — CBC WITH DIFFERENTIAL/PLATELET
Basophils Absolute: 0 10*3/uL (ref 0.0–0.2)
Basos: 1 %
EOS (ABSOLUTE): 0.2 10*3/uL (ref 0.0–0.4)
Eos: 3 %
Hematocrit: 45.6 % (ref 34.0–46.6)
Hemoglobin: 15.4 g/dL (ref 11.1–15.9)
Immature Grans (Abs): 0 10*3/uL (ref 0.0–0.1)
Immature Granulocytes: 0 %
Lymphocytes Absolute: 2.7 10*3/uL (ref 0.7–3.1)
Lymphs: 38 %
MCH: 29.8 pg (ref 26.6–33.0)
MCHC: 33.8 g/dL (ref 31.5–35.7)
MCV: 88 fL (ref 79–97)
Monocytes Absolute: 0.6 10*3/uL (ref 0.1–0.9)
Monocytes: 8 %
Neutrophils Absolute: 3.5 10*3/uL (ref 1.4–7.0)
Neutrophils: 50 %
Platelets: 208 10*3/uL (ref 150–450)
RBC: 5.17 x10E6/uL (ref 3.77–5.28)
RDW: 12.1 % (ref 11.7–15.4)
WBC: 7 10*3/uL (ref 3.4–10.8)

## 2020-08-23 LAB — COMPREHENSIVE METABOLIC PANEL
ALT: 22 IU/L (ref 0–32)
AST: 15 IU/L (ref 0–40)
Albumin/Globulin Ratio: 1.3 (ref 1.2–2.2)
Albumin: 4.1 g/dL (ref 3.7–4.7)
Alkaline Phosphatase: 58 IU/L (ref 44–121)
BUN/Creatinine Ratio: 31 — ABNORMAL HIGH (ref 12–28)
BUN: 25 mg/dL (ref 8–27)
Bilirubin Total: 0.4 mg/dL (ref 0.0–1.2)
CO2: 23 mmol/L (ref 20–29)
Calcium: 9.3 mg/dL (ref 8.7–10.3)
Chloride: 99 mmol/L (ref 96–106)
Creatinine, Ser: 0.8 mg/dL (ref 0.57–1.00)
Globulin, Total: 3.1 g/dL (ref 1.5–4.5)
Glucose: 135 mg/dL — ABNORMAL HIGH (ref 65–99)
Potassium: 4.7 mmol/L (ref 3.5–5.2)
Sodium: 139 mmol/L (ref 134–144)
Total Protein: 7.2 g/dL (ref 6.0–8.5)
eGFR: 79 mL/min/{1.73_m2} (ref >59)

## 2020-08-23 LAB — LIPID PANEL W/O CHOL/HDL RATIO
Cholesterol, Total: 163 mg/dL (ref 100–199)
HDL: 43 mg/dL (ref >39)
LDL Chol Calc (NIH): 90 mg/dL (ref 0–99)
Triglycerides: 175 mg/dL — ABNORMAL HIGH (ref 0–149)
VLDL Cholesterol Cal: 30 mg/dL (ref 5–40)

## 2020-08-23 LAB — VITAMIN D 25 HYDROXY (VIT D DEFICIENCY, FRACTURES): Vit D, 25-Hydroxy: 65.6 ng/mL (ref 30.0–100.0)

## 2020-08-25 ENCOUNTER — Encounter: Payer: Self-pay | Admitting: Family Medicine

## 2020-08-25 ENCOUNTER — Ambulatory Visit: Payer: Self-pay | Admitting: *Deleted

## 2020-08-25 MED ORDER — SULFAMETHOXAZOLE-TRIMETHOPRIM 800-160 MG PO TABS
1.0000 | ORAL_TABLET | Freq: Two times a day (BID) | ORAL | 0 refills | Status: DC
Start: 2020-08-25 — End: 2020-12-02

## 2020-08-25 NOTE — Telephone Encounter (Signed)
Fine

## 2020-08-25 NOTE — Telephone Encounter (Signed)
Please advise 

## 2020-08-25 NOTE — Telephone Encounter (Signed)
Pt verbalized understanding.

## 2020-08-25 NOTE — Addendum Note (Signed)
Addended by: Valerie Roys on: 08/25/2020 02:11 PM   Modules accepted: Orders

## 2020-08-25 NOTE — Telephone Encounter (Signed)
Rx sent to her pharmacy 

## 2020-08-25 NOTE — Telephone Encounter (Signed)
I returned pt's call.  She was seen by Dr. Park Liter on 08/22/2020.   Her urine sample is showing she has a UTI.  Pt is wanting to know if Dr. Wynetta Emery wants her on  An antibiotic?   Nothing has been called into her pharmacy.   Her 4 refills were done but nothing for the UTI.   She saw her results on MyChart.  I let her know I would send a note to Dr. Wynetta Emery and someone would call her back with Dr. Durenda Age answer.   She was agreeable to this plan.  I sent my notes to [facility] high priority for Dr. Park Liter to review.    Pt can be reached at [phone].  Reason for Disposition . [1] Caller has URGENT medicine question about med that PCP or specialist prescribed AND [2] triager unable to answer question    Does Dr. Wynetta Emery want her to be on an antibiotic for the UTI from her OV on 08/22/2020?  Answer Assessment - Initial Assessment Questions 1. NAME of MEDICATION: "What medicine are you calling about?"     I'm wondering if I need an antibiotic for the UTI?    Dr. Wynetta Emery refilled 4 prescriptions but not the UTI?   2. QUESTION: "What is your question?" (e.g., double dose of medicine, side effect)     Does Dr. Wynetta Emery want to to take an antibiotic for the UTI from my urine sample from my visit on 08/22/2020. 3. PRESCRIBING HCP: "Who prescribed it?" Reason: if prescribed by specialist, call should be referred to that group.     Dr. Park Liter 4. SYMPTOMS: "Do you have any symptoms?"     No symptoms just my urine sample shows I have a UTI She had a UTI 07/03/2020 and took Ceftriaxone for it.   "I'm not sure if I ever completely got over that UTI or if this is a new UTI". 5. SEVERITY: If symptoms are present, ask "Are they mild, moderate or severe?"     No urinary symptoms just what is showing up in the urine sample. 6. PREGNANCY:  "Is there any chance that you are pregnant?" "When was your last menstrual period?"     N/A due to age  Protocols used: MEDICATION  QUESTION CALL-A-AH

## 2020-08-25 NOTE — Telephone Encounter (Signed)
Patient notified

## 2020-08-26 ENCOUNTER — Telehealth: Payer: Self-pay | Admitting: Family Medicine

## 2020-08-26 NOTE — Telephone Encounter (Signed)
Patient aware medication has been called into [facility] in [place].

## 2020-08-26 NOTE — Telephone Encounter (Signed)
Patient is calling again to check the status of an antibiotic that was supposed to be sent to the pharmacy and still has not been received.  Please advise and call patient to discuss asap

## 2020-08-26 NOTE — Telephone Encounter (Signed)
Pt called this morning .  The antibiotic has not went through yet.    [facility] [place]  CB#  [phone]

## 2020-09-08 LAB — COLOGUARD

## 2020-09-10 LAB — COLOGUARD

## 2020-09-18 LAB — COLOGUARD: Cologuard: NEGATIVE

## 2020-09-25 LAB — COLOGUARD: COLOGUARD: NEGATIVE

## 2020-10-03 ENCOUNTER — Telehealth: Payer: Self-pay

## 2020-10-03 NOTE — Telephone Encounter (Signed)
Copied from [place] [id number]. Topic: Quick Communication - Lab Results (Clinic Use ONLY) >> Oct 03, 2020  8:54 AM Holley Dexter N wrote: Pt called in wanting a nurse to reach out to her about her Cologuard lab results, please advise.

## 2020-10-03 NOTE — Telephone Encounter (Signed)
Called patient. She states that she sent in the second sample 2 weeks ago and has not gotten result.   Public librarian. Result is negative and they are sending the result via fax over to [place] now. Will enter into chart once received.

## 2020-10-03 NOTE — Telephone Encounter (Signed)
Called and notified patient of result.

## 2020-10-08 ENCOUNTER — Telehealth: Payer: Self-pay | Admitting: Family Medicine

## 2020-10-08 NOTE — Telephone Encounter (Signed)
Spoke with patient and notified her of negative results on Cologuard. Patient verbalized understanding. Patient states she is not able to see her result via MyChart. Patient was informed that it may be due to the agent is an outside source. Patient verbalized understanding and has no further questions.

## 2020-10-08 NOTE — Telephone Encounter (Signed)
Patient asked about ColorGuard result.  She is not able to view in Mychart

## 2020-10-24 ENCOUNTER — Ambulatory Visit (INDEPENDENT_AMBULATORY_CARE_PROVIDER_SITE_OTHER): Payer: Medicare Other

## 2020-10-24 VITALS — Ht 63.0 in | Wt 340.0 lb

## 2020-10-24 DIAGNOSIS — Z0000 Encounter for general adult medical examination without abnormal findings: Secondary | ICD-10-CM

## 2020-10-24 NOTE — Progress Notes (Signed)
I connected with Sharon Ware today by telephone and verified that I am speaking with the correct person using two identifiers. Location patient: home Location provider: work Persons participating in the virtual visit: Rosaida, Ryant LPN.   I discussed the limitations, risks, security and privacy concerns of performing an evaluation and management service by telephone and the availability of in person appointments. I also discussed with the patient that there may be a patient responsible charge related to this service. The patient expressed understanding and verbally consented to this telephonic visit.    Interactive audio and video telecommunications were attempted between this provider and patient, however failed, due to patient having technical difficulties OR patient did not have access to video capability.  We continued and completed visit with audio only.     Vital signs may be patient reported or missing.  Subjective:   Sharon Ware is a 70 y.o. female who presents for Medicare Annual (Subsequent) preventive examination.  Review of Systems     Cardiac Risk Factors include: advanced age (>33mn, >>53women);diabetes mellitus;dyslipidemia;hypertension;obesity (BMI >30kg/m2);sedentary lifestyle     Objective:    Today's Vitals   10/24/20 1026  Weight: (!) 340 lb (154.2 kg)  Height: '5\' 3"'$  (1.6 m)   Body mass index is 60.23 kg/m.  Advanced Directives 10/24/2020 06/28/2019  Does Patient Have a Medical Advance Directive? Yes Yes  Type of AParamedicof AMayerLiving will Living will;Healthcare Power of [place] Chart? No - copy requested No - copy requested    Current Medications (verified) Outpatient Encounter Medications as of 10/24/2020  Medication Sig   acetaminophen (TYLENOL) 650 MG CR tablet Take 650 mg by mouth 2 (two) times a day.   aspirin 81 MG tablet Take 81 mg by mouth daily.    Cholecalciferol (VITAMIN D3 PO) Take 5,000 Units by mouth daily.   GLUCOSAMINE HCL PO Take by mouth.   losartan (COZAAR) 50 MG tablet Take 1 tablet (50 mg total) by mouth daily.   lovastatin (MEVACOR) 10 MG tablet Take 1 tablet (10 mg total) by mouth daily.   Menthol, Topical Analgesic, (ICY HOT EX) Apply topically.   metFORMIN (GLUCOPHAGE-XR) 500 MG 24 hr tablet Take 1 tablet (500 mg total) by mouth 2 (two) times daily.   Multiple Vitamin (MULTIVITAMIN) tablet Take 1 tablet by mouth daily.   Omega-3 Fatty Acids (FISH OIL OMEGA-3 PO) Take by mouth daily.   spironolactone (ALDACTONE) 50 MG tablet Take 1 tablet (50 mg total) by mouth daily.   sulfamethoxazole-trimethoprim (BACTRIM DS) 800-160 MG tablet Take 1 tablet by mouth 2 (two) times daily. (Patient not taking: Reported on 10/24/2020)   tiZANidine (ZANAFLEX) 4 MG tablet Take 1 tablet (4 mg total) by mouth every 8 (eight) hours as needed for muscle spasms. (Patient not taking: No sig reported)   No facility-administered encounter medications on file as of 10/24/2020.    Allergies (verified) Morphine and related and Latex   History: Past Medical History:  Diagnosis Date   Arthritis    Cataract    [place]    Hyperlipidemia    Hypertension    Vitamin D deficiency    Past Surgical History:  Procedure Laterality Date   ABDOMINAL HYSTERECTOMY     BREAST BIOPSY Right 1990s   neg   BREAST BIOPSY Right 2000s   neg   BREAST EXCISIONAL BIOPSY Right    neg   EYE SURGERY  Salpingo-oophorectomy     Family History  Problem Relation Age of Onset   Breast cancer Mother 63   Heart disease Father    Cancer Paternal Grandfather    Heart disease Paternal Grandfather    Social History   Socioeconomic History   Marital status: Married    Spouse name: Not on file   Number of children: Not on file   Years of education: Not on file   Highest education level: Not on file  Occupational History    Occupation: retired  Tobacco Use   Smoking status: Never   Smokeless tobacco: Never  Vaping Use   Vaping Use: Never used  Substance and Sexual Activity   Alcohol use: Not Currently   Drug use: Never   Sexual activity: Not Currently  Other Topics Concern   Not on file  Social History Narrative   Not on file   Social Determinants of Health   Financial Resource Strain: Low Risk    Difficulty of Paying Living Expenses: Not hard at all  Food Insecurity: No Food Insecurity   Worried About Charity fundraiser in the Last Year: Never true   Fidelity in the Last Year: Never true  Transportation Needs: No Transportation Needs   Lack of Transportation (Medical): No   Lack of Transportation (Non-Medical): No  Physical Activity: Inactive   Days of Exercise per Week: 0 days   Minutes of Exercise per Session: 0 min  Stress: No Stress Concern Present   Feeling of Stress : Not at all  Social Connections: Not on file    Tobacco Counseling Counseling given: Not Answered   Clinical Intake:  Pre-visit preparation completed: Yes  Pain : No/denies pain     Nutritional Risks: None Diabetes: Yes  How often do you need to have someone help you when you read instructions, pamphlets, or other written materials from your doctor or pharmacy?: 1 - Never What is the last grade level you completed in school?: college  Diabetic? Yes Nutrition Risk Assessment:  Has the patient had any N/V/D within the last 2 months?  No  Does the patient have any non-healing wounds?  No  Has the patient had any unintentional weight loss or weight gain?  No   Diabetes:  Is the patient diabetic?  Yes  If diabetic, was a CBG obtained today?  No  Did the patient bring in their glucometer from home?  No  How often do you monitor your CBG's? Does not.   Financial Strains and Diabetes Management:  Are you having any financial strains with the device, your supplies or your medication? No .  Does the  patient want to be seen by Chronic Care Management for management of their diabetes?  No  Would the patient like to be referred to a Nutritionist or for Diabetic Management?  No   Diabetic Exams:  Diabetic Eye Exam: Completed 07/25/2020 Diabetic Foot Exam: Completed 08/22/2020   Interpreter Needed?: No  Information entered by :: NAllen LPN   Activities of Daily Living In your present state of health, do you have any difficulty performing the following activities: 10/24/2020 07/03/2020  Hearing? N N  Vision? N N  Difficulty concentrating or making decisions? N N  Walking or climbing stairs? N Y  Dressing or bathing? N Y  Doing errands, shopping? N N  Preparing Food and eating ? N -  Using the Toilet? N -  In the past six months, have you accidently leaked  urine? Y -  Do you have problems with loss of bowel control? N -  Managing your Medications? N -  Managing your Finances? N -  Housekeeping or managing your Housekeeping? N -  Some recent data might be hidden    Patient Care Team: Valerie Roys, DO as PCP - General (Family Medicine)  Indicate any recent Medical Services you may have received from other than [facility] providers in the past year (date may be approximate).     Assessment:   This is a routine wellness examination for [facility].  Hearing/Vision screen Vision Screening - Comments:: Regular eye exams, Dr. Edison Pace  Dietary issues and exercise activities discussed: Current Exercise Habits: The patient does not participate in regular exercise at present   Goals Addressed             This Visit's Progress    Patient Stated       10/24/2020, stay alive       Depression Screen PHQ 2/9 Scores 10/24/2020 06/24/2020 08/20/2019 08/08/2018  PHQ - 2 Score 0 0 0 0  PHQ- 9 Score - - 0 0    Fall Risk Fall Risk  10/24/2020 02/21/2020 06/28/2019 08/08/2018  Falls in the past year? 0 0 0 0  Number falls in past yr: - - 0 0  Comment - - - None  Injury with Fall? - - 0 0  Risk  for fall due to : Medication side effect - - Impaired balance/gait  Follow up Falls evaluation completed;Education provided;Falls prevention discussed - - Falls evaluation completed    FALL RISK PREVENTION PERTAINING TO THE HOME:  Any stairs in or around the home? Yes  If so, are there any without handrails? No  Home free of loose throw rugs in walkways, pet beds, electrical cords, etc? Yes  Adequate lighting in your home to reduce risk of falls? Yes   ASSISTIVE DEVICES UTILIZED TO PREVENT FALLS:  Life alert? No  Use of a cane, walker or w/c? Yes  Grab bars in the bathroom? Yes  Shower chair or bench in shower? Yes  Elevated toilet seat or a handicapped toilet? Yes   TIMED UP AND GO:  Was the test performed? No .      Cognitive Function:     6CIT Screen 10/24/2020  What Year? 0 points  What month? 0 points  What time? 0 points  Count back from 20 0 points  Months in reverse 0 points  Repeat phrase 0 points  Total Score 0    Immunizations Immunization History  Administered Date(s) Administered   Fluad Quad(high Dose 65+) 01/09/2020   Influenza-Unspecified 12/09/2018   Moderna Sars-Covid-2 Vaccination 05/08/2019, 06/05/2019, 02/15/2020   Pneumococcal Conjugate-13 02/03/2014   Pneumococcal Polysaccharide-23 04/05/2009, 04/05/2014   Td 08/09/2018    TDAP status: Up to date  Flu Vaccine status: Up to date  Pneumococcal vaccine status: Up to date  Covid-19 vaccine status: Completed vaccines  Qualifies for Shingles Vaccine? Yes   Zostavax completed No   Shingrix Completed?: No.    Education has been provided regarding the importance of this vaccine. Patient has been advised to call insurance company to determine out of pocket expense if they have not yet received this vaccine. Advised may also receive vaccine at local pharmacy or Health Dept. Verbalized acceptance and understanding.  Screening Tests Health Maintenance  Topic Date Due   Hepatitis C Screening   Never done   Zoster Vaccines- Shingrix (1 of 2) Never done  COVID-19 Vaccine (4 - Booster for Moderna series) 06/14/2020   INFLUENZA VACCINE  11/03/2020   HEMOGLOBIN A1C  02/22/2021   OPHTHALMOLOGY EXAM  07/25/2021   FOOT EXAM  08/22/2021   MAMMOGRAM  09/19/2021   Fecal DNA (Cologuard)  09/19/2023   TETANUS/TDAP  08/08/2028   DEXA SCAN  Completed   PNA vac Low Risk Adult  Completed   HPV VACCINES  Aged Out    Health Maintenance  Health Maintenance Due  Topic Date Due   Hepatitis C Screening  Never done   Zoster Vaccines- Shingrix (1 of 2) Never done   COVID-19 Vaccine (4 - Booster for Moderna series) 06/14/2020    Colorectal cancer screening: Type of screening: Cologuard. Completed 09/18/2020. Repeat every 3 years  Mammogram status: patient to schedule  Bone Density status: Completed 04/05/2012.   Lung Cancer Screening: (Low Dose CT Chest recommended if Age 5-80 years, 30 pack-year currently smoking OR have quit w/in 15years.) does not qualify.   Lung Cancer Screening Referral: no  Additional Screening:  Hepatitis C Screening: does qualify; due  Vision Screening: Recommended annual ophthalmology exams for early detection of glaucoma and other disorders of the eye. Is the patient up to date with their annual eye exam?  Yes  Who is the provider or what is the name of the office in which the patient attends annual eye exams? Dr. Edison Pace If pt is not established with a provider, would they like to be referred to a provider to establish care? No .   Dental Screening: Recommended annual dental exams for proper oral hygiene  Community Resource Referral / Chronic Care Management: CRR required this visit?  No   CCM required this visit?  No      Plan:     I have personally reviewed and noted the following in the patient's chart:   Medical and social history Use of alcohol, tobacco or illicit drugs  Current medications and supplements including opioid prescriptions.   Functional ability and status Nutritional status Physical activity Advanced directives List of other physicians Hospitalizations, surgeries, and ER visits in previous 12 months Vitals Screenings to include cognitive, depression, and falls Referrals and appointments  In addition, I have reviewed and discussed with patient certain preventive protocols, quality metrics, and best practice recommendations. A written personalized care plan for preventive services as well as general preventive health recommendations were provided to patient.     Kellie Simmering, LPN   [id number]   Nurse Notes:

## 2020-10-24 NOTE — Patient Instructions (Signed)
Sharon Ware , Thank you for taking time to come for your Medicare Wellness Visit. I appreciate your ongoing commitment to your health goals. Please review the following plan we discussed and let me know if I can assist you in the future.   Screening recommendations/referrals: Colonoscopy: cologuard 09/18/2020, due 09/19/2023 Mammogram: patient to schedule Bone Density: completed 04/05/2012 Recommended yearly ophthalmology/optometry visit for glaucoma screening and checkup Recommended yearly dental visit for hygiene and checkup  Vaccinations: Influenza vaccine: due 11/03/2020 Pneumococcal vaccine: completed 04/05/2014 Tdap vaccine: completed 08/09/2018, due 08/08/2028 Shingles vaccine: discussed   Covid-19: 02/15/2020, 06/05/2019, 05/08/2019  Advanced directives: Please bring a copy of your POA (Power of Attorney) and/or Living Will to your next appointment.   Conditions/risks identified: none  Next appointment: Follow up in one year for your annual wellness visit    Preventive Care 65 Years and Older, Female Preventive care refers to lifestyle choices and visits with your health care provider that can promote health and wellness. What does preventive care include? A yearly physical exam. This is also called an annual well check. Dental exams once or twice a year. Routine eye exams. Ask your health care provider how often you should have your eyes checked. Personal lifestyle choices, including: Daily care of your teeth and gums. Regular physical activity. Eating a healthy diet. Avoiding tobacco and drug use. Limiting alcohol use. Practicing safe sex. Taking low-dose aspirin every day. Taking vitamin and mineral supplements as recommended by your health care provider. What happens during an annual well check? The services and screenings done by your health care provider during your annual well check will depend on your age, overall health, lifestyle risk factors, and family history of  disease. Counseling  Your health care provider may ask you questions about your: Alcohol use. Tobacco use. Drug use. Emotional well-being. Home and relationship well-being. Sexual activity. Eating habits. History of falls. Memory and ability to understand (cognition). Work and work Statistician. Reproductive health. Screening  You may have the following tests or measurements: Height, weight, and BMI. Blood pressure. Lipid and cholesterol levels. These may be checked every 5 years, or more frequently if you are over 68 years old. Skin check. Lung cancer screening. You may have this screening every year starting at age 50 if you have a 30-pack-year history of smoking and currently smoke or have quit within the past 15 years. Fecal occult blood test (FOBT) of the stool. You may have this test every year starting at age 18. Flexible sigmoidoscopy or colonoscopy. You may have a sigmoidoscopy every 5 years or a colonoscopy every 10 years starting at age 26. Hepatitis C blood test. Hepatitis B blood test. Sexually transmitted disease (STD) testing. Diabetes screening. This is done by checking your blood sugar (glucose) after you have not eaten for a while (fasting). You may have this done every 1-3 years. Bone density scan. This is done to screen for osteoporosis. You may have this done starting at age 34. Mammogram. This may be done every 1-2 years. Talk to your health care provider about how often you should have regular mammograms. Talk with your health care provider about your test results, treatment options, and if necessary, the need for more tests. Vaccines  Your health care provider may recommend certain vaccines, such as: Influenza vaccine. This is recommended every year. Tetanus, diphtheria, and acellular pertussis (Tdap, Td) vaccine. You may need a Td booster every 10 years. Zoster vaccine. You may need this after age 30. Pneumococcal 13-valent conjugate (PCV13)  vaccine. One  dose is recommended after age 34. Pneumococcal polysaccharide (PPSV23) vaccine. One dose is recommended after age 43. Talk to your health care provider about which screenings and vaccines you need and how often you need them. This information is not intended to replace advice given to you by your health care provider. Make sure you discuss any questions you have with your health care provider. Document Released: 04/18/2015 Document Revised: 12/10/2015 Document Reviewed: 01/21/2015 Elsevier Interactive Patient Education  2017 [place] Prevention in the Home Falls can cause injuries. They can happen to people of all ages. There are many things you can do to make your home safe and to help prevent falls. What can I do on the outside of my home? Regularly fix the edges of walkways and driveways and fix any cracks. Remove anything that might make you trip as you walk through a door, such as a raised step or threshold. Trim any bushes or trees on the path to your home. Use bright outdoor lighting. Clear any walking paths of anything that might make someone trip, such as rocks or tools. Regularly check to see if handrails are loose or broken. Make sure that both sides of any steps have handrails. Any raised decks and porches should have guardrails on the edges. Have any leaves, snow, or ice cleared regularly. Use sand or salt on walking paths during winter. Clean up any spills in your garage right away. This includes oil or grease spills. What can I do in the bathroom? Use night lights. Install grab bars by the toilet and in the tub and shower. Do not use towel bars as grab bars. Use non-skid mats or decals in the tub or shower. If you need to sit down in the shower, use a plastic, non-slip stool. Keep the floor dry. Clean up any water that spills on the floor as soon as it happens. Remove soap buildup in the tub or shower regularly. Attach bath mats securely with double-sided  non-slip rug tape. Do not have throw rugs and other things on the floor that can make you trip. What can I do in the bedroom? Use night lights. Make sure that you have a light by your bed that is easy to reach. Do not use any sheets or blankets that are too big for your bed. They should not hang down onto the floor. Have a firm chair that has side arms. You can use this for support while you get dressed. Do not have throw rugs and other things on the floor that can make you trip. What can I do in the kitchen? Clean up any spills right away. Avoid walking on wet floors. Keep items that you use a lot in easy-to-reach places. If you need to reach something above you, use a strong step stool that has a grab bar. Keep electrical cords out of the way. Do not use floor polish or wax that makes floors slippery. If you must use wax, use non-skid floor wax. Do not have throw rugs and other things on the floor that can make you trip. What can I do with my stairs? Do not leave any items on the stairs. Make sure that there are handrails on both sides of the stairs and use them. Fix handrails that are broken or loose. Make sure that handrails are as long as the stairways. Check any carpeting to make sure that it is firmly attached to the stairs. Fix any carpet that is loose  or worn. Avoid having throw rugs at the top or bottom of the stairs. If you do have throw rugs, attach them to the floor with carpet tape. Make sure that you have a light switch at the top of the stairs and the bottom of the stairs. If you do not have them, ask someone to add them for you. What else can I do to help prevent falls? Wear shoes that: Do not have high heels. Have rubber bottoms. Are comfortable and fit you well. Are closed at the toe. Do not wear sandals. If you use a stepladder: Make sure that it is fully opened. Do not climb a closed stepladder. Make sure that both sides of the stepladder are locked into place. Ask  someone to hold it for you, if possible. Clearly mark and make sure that you can see: Any grab bars or handrails. First and last steps. Where the edge of each step is. Use tools that help you move around (mobility aids) if they are needed. These include: Canes. Walkers. Scooters. Crutches. Turn on the lights when you go into a dark area. Replace any light bulbs as soon as they burn out. Set up your furniture so you have a clear path. Avoid moving your furniture around. If any of your floors are uneven, fix them. If there are any pets around you, be aware of where they are. Review your medicines with your doctor. Some medicines can make you feel dizzy. This can increase your chance of falling. Ask your doctor what other things that you can do to help prevent falls. This information is not intended to replace advice given to you by your health care provider. Make sure you discuss any questions you have with your health care provider. Document Released: 01/16/2009 Document Revised: 08/28/2015 Document Reviewed: 04/26/2014 Elsevier Interactive Patient Education  2017 [employer].

## 2020-11-03 ENCOUNTER — Other Ambulatory Visit: Payer: Self-pay | Admitting: Family Medicine

## 2020-11-03 DIAGNOSIS — Z1231 Encounter for screening mammogram for malignant neoplasm of breast: Secondary | ICD-10-CM

## 2020-11-10 ENCOUNTER — Other Ambulatory Visit: Payer: Self-pay

## 2020-11-10 ENCOUNTER — Ambulatory Visit
Admission: RE | Admit: 2020-11-10 | Discharge: 2020-11-10 | Disposition: A | Discharge status: Home / Self Care | Payer: Medicare Other | Source: Ambulatory Visit | Attending: Family Medicine | Admitting: Family Medicine

## 2020-11-10 DIAGNOSIS — Z1231 Encounter for screening mammogram for malignant neoplasm of breast: Secondary | ICD-10-CM | POA: Diagnosis not present

## 2020-11-12 ENCOUNTER — Other Ambulatory Visit: Payer: Self-pay | Admitting: Family Medicine

## 2020-11-12 DIAGNOSIS — R928 Other abnormal and inconclusive findings on diagnostic imaging of breast: Secondary | ICD-10-CM

## 2020-11-13 ENCOUNTER — Ambulatory Visit: Payer: Medicare Other | Admitting: Nurse Practitioner

## 2020-11-17 ENCOUNTER — Ambulatory Visit
Admission: RE | Admit: 2020-11-17 | Discharge: 2020-11-17 | Disposition: A | Discharge status: Home / Self Care | Payer: Medicare Other | Source: Ambulatory Visit | Attending: Family Medicine | Admitting: Family Medicine

## 2020-11-17 ENCOUNTER — Other Ambulatory Visit: Payer: Self-pay

## 2020-11-17 DIAGNOSIS — R928 Other abnormal and inconclusive findings on diagnostic imaging of breast: Secondary | ICD-10-CM

## 2020-11-19 ENCOUNTER — Telehealth: Payer: Self-pay

## 2020-11-19 ENCOUNTER — Other Ambulatory Visit: Payer: Self-pay | Admitting: Family Medicine

## 2020-11-19 DIAGNOSIS — R928 Other abnormal and inconclusive findings on diagnostic imaging of breast: Secondary | ICD-10-CM

## 2020-11-19 DIAGNOSIS — N6320 Unspecified lump in the left breast, unspecified quadrant: Secondary | ICD-10-CM

## 2020-11-19 NOTE — Telephone Encounter (Signed)
Copied from [place] [id number]. Topic: General - Other >> Nov 19, 2020  3:36 PM Bayard Beaver wrote: Reason for CRM: patient asking will needle biopsy be scheduled based on her results of mammogram. Please call back

## 2020-11-20 NOTE — Telephone Encounter (Signed)
Patient made aware of to just Call [facility] breast center and schedule the biopsy as recommended by her recent maging and verbalized understanding.

## 2020-11-24 ENCOUNTER — Ambulatory Visit
Admission: RE | Admit: 2020-11-24 | Discharge: 2020-11-24 | Disposition: A | Discharge status: Home / Self Care | Payer: Medicare Other | Source: Ambulatory Visit | Attending: Family Medicine | Admitting: Family Medicine

## 2020-11-24 ENCOUNTER — Ambulatory Visit: Payer: Medicare Other | Admitting: Family Medicine

## 2020-11-24 ENCOUNTER — Other Ambulatory Visit: Payer: Self-pay

## 2020-11-24 DIAGNOSIS — R928 Other abnormal and inconclusive findings on diagnostic imaging of breast: Secondary | ICD-10-CM | POA: Insufficient documentation

## 2020-11-24 DIAGNOSIS — N6320 Unspecified lump in the left breast, unspecified quadrant: Secondary | ICD-10-CM | POA: Diagnosis present

## 2020-11-24 HISTORY — PX: BREAST BIOPSY: SHX20

## 2020-11-27 ENCOUNTER — Ambulatory Visit: Payer: Medicare Other | Admitting: Surgery

## 2020-11-27 DIAGNOSIS — C50919 Malignant neoplasm of unspecified site of unspecified female breast: Secondary | ICD-10-CM

## 2020-12-01 ENCOUNTER — Other Ambulatory Visit: Payer: Self-pay

## 2020-12-01 ENCOUNTER — Inpatient Hospital Stay: Payer: Medicare Other

## 2020-12-01 ENCOUNTER — Encounter: Payer: Self-pay | Admitting: Oncology

## 2020-12-01 ENCOUNTER — Inpatient Hospital Stay: Payer: Medicare Other | Attending: Oncology | Admitting: Oncology

## 2020-12-01 VITALS — BP 151/84 | HR 72 | Temp 96.8°F | Resp 17 | Wt 360.0 lb

## 2020-12-01 DIAGNOSIS — Z79899 Other long term (current) drug therapy: Secondary | ICD-10-CM | POA: Diagnosis not present

## 2020-12-01 DIAGNOSIS — E119 Type 2 diabetes mellitus without complications: Secondary | ICD-10-CM | POA: Insufficient documentation

## 2020-12-01 DIAGNOSIS — Z6841 Body Mass Index (BMI) 40.0 and over, adult: Secondary | ICD-10-CM | POA: Insufficient documentation

## 2020-12-01 DIAGNOSIS — C50212 Malignant neoplasm of upper-inner quadrant of left female breast: Secondary | ICD-10-CM | POA: Diagnosis present

## 2020-12-01 DIAGNOSIS — Z7984 Long term (current) use of oral hypoglycemic drugs: Secondary | ICD-10-CM | POA: Diagnosis not present

## 2020-12-01 DIAGNOSIS — Z7189 Other specified counseling: Secondary | ICD-10-CM | POA: Insufficient documentation

## 2020-12-01 NOTE — Progress Notes (Signed)
Patient here for initial oncology appointment, concerns of occasional incontinence

## 2020-12-01 NOTE — Progress Notes (Signed)
Patient ID: Sharon Ware, female   DOB: 1951/03/31, 70 y.o.   MRN: [id number] Initiated navigation 11/27/20.  Supported patient , family at initial Med/Onc visit with Dr. Janese Banks.  Care Plan Summary reviewed. Surgical Consult tomorrow 12/03/20 with Dr. Christian Mate.  ER?PR and HER2 status pending.

## 2020-12-01 NOTE — Progress Notes (Signed)
Hematology/Oncology Consult note [facility] [phone] [phone]  Patient Care Team: Valerie Roys, DO as PCP - General (Family Medicine) Theodore Demark, RN as Oncology Nurse Navigator   Name of the patient: Sharon Ware  [id number]  1950/07/14    Reason for referral-new diagnosis of breast cancer   Referring physician-Dr. Wynetta Emery  Date of visit: 12/01/20   History of presenting illness- Patient is a 70 year old female with a past medical history significant for hypertension hyperlipidemia and diabetes who recently underwent a screening mammogram on 11/10/2020 which showed a possible distortion in the left breast.  Ultrasound showed a 1.2 x 0.8 x 0.7 cm mass 8 cm from the nipple at the 10:30 position.  No suspicious left axillary adenopathy.  This was biopsied and was consistent with invasive mammary carcinoma with tubular features grade 1.  No lymphovascular invasion identified.  ER/PR and HER2 currently pending.  Family history significant for breast cancer in her mother at the age of 57.  ECOG PS- 1  Pain scale- 0   Review of systems- Review of Systems  Constitutional:  Negative for chills, fever, malaise/fatigue and weight loss.  HENT:  Negative for congestion, ear discharge and nosebleeds.   Eyes:  Negative for blurred vision.  Respiratory:  Negative for cough, hemoptysis, sputum production, shortness of breath and wheezing.   Cardiovascular:  Negative for chest pain, palpitations, orthopnea and claudication.  Gastrointestinal:  Negative for abdominal pain, blood in stool, constipation, diarrhea, heartburn, melena, nausea and vomiting.  Genitourinary:  Negative for dysuria, flank pain, frequency, hematuria and urgency.  Musculoskeletal:  Negative for back pain, joint pain and myalgias.  Skin:  Negative for rash.  Neurological:  Negative for dizziness, tingling, focal weakness, seizures, weakness and headaches.   Endo/Heme/Allergies:  Does not bruise/bleed easily.  Psychiatric/Behavioral:  Negative for depression and suicidal ideas. The patient does not have insomnia.    Allergies  Allergen Reactions   Morphine And Related Other (See Comments)    Hypotension   Latex Rash    Patient Active Problem List   Diagnosis Date Noted   Morbid obesity ([place]) 08/08/2018   BMI 60.0-69.9, adult ([place]) 08/08/2018   Diabetes mellitus without complication ([place]) 28/31/5176   Hyperlipidemia 08/08/2018   Hypertension associated with diabetes ([place] ) 08/08/2018   Arthritis of both knees 08/08/2018   Vitamin D deficiency 08/08/2018     Past Medical History:  Diagnosis Date   Arthritis    Breast cancer ([place])    Cataract    Diabetes mellitus without complication ([place])    Hyperlipidemia    Hypertension    Vitamin D deficiency      Past Surgical History:  Procedure Laterality Date   ABDOMINAL HYSTERECTOMY     BREAST BIOPSY Right 1990s   neg   BREAST BIOPSY Right 2000s   neg   BREAST BIOPSY Left 11/24/2020   u/s bx 10:30/8 cmfn-"heart" marker   BREAST EXCISIONAL BIOPSY Right    neg   EYE SURGERY  2004   Salpingo-oophorectomy      Social History   Socioeconomic History   Marital status: Married    Spouse name: Not on file   Number of children: Not on file   Years of education: Not on file   Highest education level: Not on file  Occupational History   Occupation: retired  Tobacco Use   Smoking status: Never   Smokeless tobacco: Never  Vaping Use   Vaping Use: Never used  Substance and  Sexual Activity   Alcohol use: Not Currently   Drug use: Never   Sexual activity: Not Currently  Other Topics Concern   Not on file  Social History Narrative   Not on file   Social Determinants of Health   Financial Resource Strain: Low Risk    Difficulty of Paying Living Expenses: Not hard at all  Food Insecurity: No Food Insecurity   Worried About Running Out of Food in the Last Year: Never true    [place] in the Last Year: Never true  Transportation Needs: No Transportation Needs   Lack of Transportation (Medical): No   Lack of Transportation (Non-Medical): No  Physical Activity: Inactive   Days of Exercise per Week: 0 days   Minutes of Exercise per Session: 0 min  Stress: No Stress Concern Present   Feeling of Stress : Not at all  Social Connections: Not on file  Intimate Partner Violence: Not on file     Family History  Problem Relation Age of Onset   Breast cancer Mother 29   Heart disease Father    Cancer Paternal Grandfather    Heart disease Paternal Grandfather      Current Outpatient Medications:    acetaminophen (TYLENOL) 650 MG CR tablet, Take 650 mg by mouth 2 (two) times a day., Disp: , Rfl:    aspirin 81 MG tablet, Take 81 mg by mouth daily., Disp: , Rfl:    Cholecalciferol (VITAMIN D3 PO), Take 5,000 Units by mouth daily., Disp: , Rfl:    GLUCOSAMINE HCL PO, Take by mouth., Disp: , Rfl:    losartan (COZAAR) 50 MG tablet, Take 1 tablet (50 mg total) by mouth daily., Disp: 90 tablet, Rfl: 1   lovastatin (MEVACOR) 10 MG tablet, Take 1 tablet (10 mg total) by mouth daily., Disp: 90 tablet, Rfl: 1   Menthol, Topical Analgesic, (ICY HOT EX), Apply topically., Disp: , Rfl:    metFORMIN (GLUCOPHAGE-XR) 500 MG 24 hr tablet, Take 1 tablet (500 mg total) by mouth 2 (two) times daily., Disp: 180 tablet, Rfl: 1   Multiple Vitamin (MULTIVITAMIN) tablet, Take 1 tablet by mouth daily., Disp: , Rfl:    Omega-3 Fatty Acids (FISH OIL OMEGA-3 PO), Take by mouth daily., Disp: , Rfl:    spironolactone (ALDACTONE) 50 MG tablet, Take 1 tablet (50 mg total) by mouth daily., Disp: 90 tablet, Rfl: 1   sulfamethoxazole-trimethoprim (BACTRIM DS) 800-160 MG tablet, Take 1 tablet by mouth 2 (two) times daily. (Patient not taking: No sig reported), Disp: 14 tablet, Rfl: 0   tiZANidine (ZANAFLEX) 4 MG tablet, Take 1 tablet (4 mg total) by mouth every 8 (eight) hours as needed for  muscle spasms. (Patient not taking: No sig reported), Disp: 30 tablet, Rfl: 0   Physical exam:  Vitals:   12/01/20 1059  BP: (!) 151/84  Pulse: 72  Resp: 17  Temp: (!) 96.8 F (36 C)  TempSrc: Tympanic  SpO2: 95%  Weight: (!) 360 lb (163.3 kg)   Physical Exam Constitutional:      General: She is not in acute distress.    Comments: She is sitting in a wheelchair  Cardiovascular:     Rate and Rhythm: Normal rate and regular rhythm.     Heart sounds: Normal heart sounds.  Pulmonary:     Effort: Pulmonary effort is normal.     Breath sounds: Normal breath sounds.  Abdominal:     General: Bowel sounds are normal.  Palpations: Abdomen is soft.  Skin:    General: Skin is warm and dry.  Neurological:     Mental Status: She is alert and oriented to person, place, and time.    Breast exam: Induration at the site of recent biopsy but no palpable distinct mass in the left breast..  No palpable mass in the right breast.  No palpable bilateral axillary adenopathy.   CMP Latest Ref Rng & Units 08/22/2020  Glucose 65 - 99 mg/dL 135(H)  BUN 8 - 27 mg/dL 25  Creatinine 0.57 - 1.00 mg/dL 0.80  Sodium 134 - 144 mmol/L 139  Potassium 3.5 - 5.2 mmol/L 4.7  Chloride 96 - 106 mmol/L 99  CO2 20 - 29 mmol/L 23  Calcium 8.7 - 10.3 mg/dL 9.3  Total Protein 6.0 - 8.5 g/dL 7.2  Total Bilirubin 0.0 - 1.2 mg/dL 0.4  Alkaline Phos 44 - 121 IU/L 58  AST 0 - 40 IU/L 15  ALT 0 - 32 IU/L 22   CBC Latest Ref Rng & Units 08/22/2020  WBC 3.4 - 10.8 x10E3/uL 7.0  Hemoglobin 11.1 - 15.9 g/dL 15.4  Hematocrit 34.0 - 46.6 % 45.6  Platelets 150 - 450 x10E3/uL 208    No images are attached to the encounter.  US BREAST LTD UNI LEFT INC AXILLA  Result Date: 11/17/2020 CLINICAL DATA:  70 year old female recalled from screening mammogram dated 11/10/2020 for possible left breast distortion. EXAM: DIGITAL DIAGNOSTIC UNILATERAL LEFT MAMMOGRAM WITH TOMOSYNTHESIS AND CAD; ULTRASOUND LEFT BREAST LIMITED  TECHNIQUE: Left digital diagnostic mammography and breast tomosynthesis was performed. The images were evaluated with computer-aided detection.; Targeted ultrasound examination of the left breast was performed. COMPARISON:  Previous exam(s). ACR Breast Density Category c: The breast tissue is heterogeneously dense, which may obscure small masses. FINDINGS: There is persistent distortion in the upper inner quadrant of the left breast at posterior depth. Further evaluation ultrasound was performed. Targeted ultrasound is performed, showing an irregular shadowing hypoechoic mass at the 10:30 position 8 cm from the nipple. It measures 1.2 x 0.8 x 0.7 cm. There is no definite associated vascularity. This correlates well with the mammographically identified distortion. Evaluation of the left axilla demonstrates no suspicious lymphadenopathy. IMPRESSION: 1. Suspicious left breast mass corresponding with the mammographically identified distortion. Recommendation is for ultrasound-guided biopsy. 2. No suspicious left axillary lymphadenopathy. RECOMMENDATION: Ultrasound-guided biopsy of the left breast. I have discussed the findings and recommendations with the patient. If applicable, a reminder letter will be sent to the patient regarding the next appointment. BI-RADS CATEGORY  4: Suspicious. Electronically Signed   By: Kristopher Oppenheim M.D.   On: 11/17/2020 15:08  MM DIAG BREAST TOMO UNI LEFT  Result Date: 11/17/2020 CLINICAL DATA:  70 year old female recalled from screening mammogram dated 11/10/2020 for possible left breast distortion. EXAM: DIGITAL DIAGNOSTIC UNILATERAL LEFT MAMMOGRAM WITH TOMOSYNTHESIS AND CAD; ULTRASOUND LEFT BREAST LIMITED TECHNIQUE: Left digital diagnostic mammography and breast tomosynthesis was performed. The images were evaluated with computer-aided detection.; Targeted ultrasound examination of the left breast was performed. COMPARISON:  Previous exam(s). ACR Breast Density Category c: The  breast tissue is heterogeneously dense, which may obscure small masses. FINDINGS: There is persistent distortion in the upper inner quadrant of the left breast at posterior depth. Further evaluation ultrasound was performed. Targeted ultrasound is performed, showing an irregular shadowing hypoechoic mass at the 10:30 position 8 cm from the nipple. It measures 1.2 x 0.8 x 0.7 cm. There is no definite associated vascularity. This correlates well with  the mammographically identified distortion. Evaluation of the left axilla demonstrates no suspicious lymphadenopathy. IMPRESSION: 1. Suspicious left breast mass corresponding with the mammographically identified distortion. Recommendation is for ultrasound-guided biopsy. 2. No suspicious left axillary lymphadenopathy. RECOMMENDATION: Ultrasound-guided biopsy of the left breast. I have discussed the findings and recommendations with the patient. If applicable, a reminder letter will be sent to the patient regarding the next appointment. BI-RADS CATEGORY  4: Suspicious. Electronically Signed   By: Kristopher Oppenheim M.D.   On: 11/17/2020 15:08  MM 3D SCREEN BREAST BILATERAL  Result Date: 11/11/2020 CLINICAL DATA:  Screening. EXAM: DIGITAL SCREENING BILATERAL MAMMOGRAM WITH TOMOSYNTHESIS AND CAD TECHNIQUE: Bilateral screening digital craniocaudal and mediolateral oblique mammograms were obtained. Bilateral screening digital breast tomosynthesis was performed. The images were evaluated with computer-aided detection. COMPARISON:  Previous exam(s). ACR Breast Density Category c: The breast tissue is heterogeneously dense, which may obscure small masses. FINDINGS: In the left breast, possible distortion warrants further evaluation. In the right breast, no findings suspicious for malignancy. IMPRESSION: Further evaluation is suggested for possible distortion in the left breast. RECOMMENDATION: Diagnostic mammogram and possibly ultrasound of the left breast. (Code:[id number]-L-32M) The  patient will be contacted regarding the findings, and additional imaging will be scheduled. BI-RADS CATEGORY  0: Incomplete. Need additional imaging evaluation and/or prior mammograms for comparison. Electronically Signed   By: Ammie Ferrier M.D.   On: 11/11/2020 10:37  MM CLIP PLACEMENT LEFT  Result Date: 11/24/2020 CLINICAL DATA:  Status post ultrasound-guided core biopsy of a mass in the upper-inner quadrant of the left breast. EXAM: 3D DIAGNOSTIC LEFT MAMMOGRAM POST ULTRASOUND BIOPSY COMPARISON:  Previous exam(s). FINDINGS: 3D Mammographic images were obtained following ultrasound guided biopsy of a mass in the upper-inner quadrant of the left breast. The biopsy marking clip is in expected location in the upper inner quadrant of the left breast. IMPRESSION: Appropriate positioning of the heart shaped biopsy marking clip at the site of biopsy in the upper inner quadrant of the left breast. Final Assessment: Post Procedure Mammograms for Marker Placement Electronically Signed   By: Lillia Mountain M.D.   On: 11/24/2020 09:57  [place] LT BREAST BX W LOC DEV 1ST LESION IMG BX SPEC US GUIDE  Addendum Date: 11/28/2020   ADDENDUM REPORT: 11/26/2020 13:52 ADDENDUM: PATHOLOGY revealed: A. BREAST, LEFT, 10:30; ULTRASOUND-GUIDED CORE BIOPSY:- INVASIVE MAMMARY CARCINOMA WITH FEATURES OF TUBULAR CARCINOMA, SEE COMMENT. Size of invasive carcinoma: 4 mm in this sample. Grade 1. Ductal carcinoma in situ (DCIS): Present, low-grade. Lymphovascular invasion: Not identified. Comment: This may be a pure tubular carcinoma. Pathology results are CONCORDANT with imaging findings, per Dr. Lillia Mountain. Pathology results and recommendations were discussed with patient via telephone on 11/26/2020. Patient reported doing well after the biopsy with no adverse symptoms, and only slight tenderness at the site. Post biopsy care instructions were reviewed, questions were answered and my direct phone number was provided. Patient was instructed  to call [facility] for any additional questions or concerns related to biopsy site. Recommend surgical consultation: Request for surgical consultation relayed to Al Pimple RN and Tanya Nones RN at [facility] by Electa Sniff RN on 11/26/2020. Pathology results reported by Electa Sniff RN on 11/26/2020. Electronically Signed   By: Lillia Mountain M.D.   On: 11/26/2020 13:52   Result Date: 11/28/2020 CLINICAL DATA:  Suspicious left breast mass. EXAM: ULTRASOUND GUIDED LEFT BREAST CORE NEEDLE BIOPSY COMPARISON:  Previous exam(s). PROCEDURE: I met with the patient and we discussed the procedure  of ultrasound-guided biopsy, including benefits and alternatives. We discussed the high likelihood of a successful procedure. We discussed the risks of the procedure, including infection, bleeding, tissue injury, clip migration, and inadequate sampling. Informed written consent was given. The usual time-out protocol was performed immediately prior to the procedure. Lesion quadrant: Upper inner quadrant Using sterile technique and 1% lidocaine and 1% lidocaine with epinephrine as local anesthetic, under direct ultrasound visualization, a 14 gauge spring-loaded device was used to perform biopsy of a mass in the 10:30 region of the left breast using a lateral to medial approach. At the conclusion of the procedure heart shaped tissue marker clip was deployed into the biopsy cavity. Follow up 2 view mammogram was performed and dictated separately. IMPRESSION: Ultrasound guided biopsy of the left breast. No apparent complications. Electronically Signed: By: Lillia Mountain M.D. On: 11/24/2020 09:38    Assessment and plan- Patient is a 70 y.o. 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  Patient found to have a 1.3  cm left breast mass.  No abnormal left axillary adenopathy visualized on ultrasound.  Pathology shows a grade 1 invasive mammary carcinoma with features of tubular carcinoma.  ER/PR and HER2 is currently pending.  Given the tubular and grade 1 histology she will likely be ER/PR positive and HER2 negative.  I recommend proceeding with upfront lumpectomy at this time and she will be discussing this further with Dr. Christian Mate tomorrow.  As per choosing wisel guidelines she can potentially omit sentinel lymph node biopsy if this is an ER/PR positive and HER2 negative tumor.  I will defer the decision for pursuing sentinel lymph node biopsy to Dr. Christian Mate  If this turns out to be an ER/PR positive HER2 negative tumor I did discuss the role of Oncotype testing and how the results are interpreted.  Patient is interested in sending of Oncotype testing if she qualifies for it.  There would also be role for postlumpectomy radiation treatment and I will arrange an appointment for her to see radiation oncology after final pathology is back.  If her tumor turns out to be ER/PR positive there would also be role for hormone therapy.  Discussed briefly the potential side effects of hormone therapy including all but not limited to hot flashes, fatigue, arthralgias and worsening bone health.  Treatment will be given with a curative intent.  No role for staging scans or tumor marker testing at this time.  Treatment will be given with a curative intent.  Given family history of breast cancer in her mother she would also qualify for genetic testing and will make a referral for the same.  I will tentatively see her back after her final pathology is back   Total face to face encounter time for this patient visit was 50 min.     Thank you for this kind referral and the opportunity to participate in the care of this patient   Visit Diagnosis 1. Malignant neoplasm of upper-inner quadrant of left female breast,  unspecified estrogen receptor status ([place])   2. Goals of care, counseling/discussion     Dr. Randa Evens, MD, MPH [facility] at [facility] [phone] 12/01/2020

## 2020-12-02 ENCOUNTER — Ambulatory Visit (INDEPENDENT_AMBULATORY_CARE_PROVIDER_SITE_OTHER): Payer: Medicare Other | Admitting: Surgery

## 2020-12-02 ENCOUNTER — Encounter: Payer: Self-pay | Admitting: Surgery

## 2020-12-02 ENCOUNTER — Ambulatory Visit: Payer: Self-pay | Admitting: Surgery

## 2020-12-02 ENCOUNTER — Other Ambulatory Visit: Payer: Self-pay

## 2020-12-02 VITALS — BP 129/81 | HR 83 | Temp 98.7°F | Ht 63.0 in | Wt 360.0 lb

## 2020-12-02 DIAGNOSIS — Z170 Estrogen receptor positive status [ER+]: Secondary | ICD-10-CM

## 2020-12-02 DIAGNOSIS — E1159 Type 2 diabetes mellitus with other circulatory complications: Secondary | ICD-10-CM

## 2020-12-02 DIAGNOSIS — C50212 Malignant neoplasm of upper-inner quadrant of left female breast: Secondary | ICD-10-CM | POA: Diagnosis not present

## 2020-12-02 DIAGNOSIS — Z6841 Body Mass Index (BMI) 40.0 and over, adult: Secondary | ICD-10-CM | POA: Diagnosis not present

## 2020-12-02 DIAGNOSIS — I152 Hypertension secondary to endocrine disorders: Secondary | ICD-10-CM

## 2020-12-02 LAB — SURGICAL PATHOLOGY

## 2020-12-02 NOTE — H&P (View-Only) (Signed)
Patient ID: Sharon Ware, female   DOB: 03/16/1951, 70 y.o.   MRN: [id number]  Chief Complaint: Left breast cancer  History of Present Illness Sharon Ware is a 70 y.o. female with left breast cancer recently diagnosed following screening mammography.  Patient has a family history having her mother who was diagnosed at the age of 48 and passing away at the age of 50.  She has had previous breast lumpectomies in the past, none that would increase her risk of eventual breast cancer.  She took birth control for 3 months back in the 1980s.  She has had a complete hysterectomy.  She underwent menarche at the age of 9, had 2 children the first was delivered at the age of 16.  She only breast-fed her second child she is never felt a lump, had any skin changes breast pain or nipple discharge.  She does do monthly breast self-examinations and has been following through with annual mammography. She saw Dr. Rao yesterday.  We raised the point of applying choosing wisely to omit sentinel lymph node biopsy.  On ultrasound her left breast lesion was measured at 1.2 cm, at 1030 o'clock, 8 cm from the nipple.  Past Medical History Past Medical History:  Diagnosis Date   Arthritis    Breast cancer (HCC)    Cataract    Diabetes mellitus without complication (HCC)    Hyperlipidemia    Hypertension    Vitamin D deficiency       Past Surgical History:  Procedure Laterality Date   ABDOMINAL HYSTERECTOMY     BREAST BIOPSY Right 1990s   neg   BREAST BIOPSY Right 2000s   neg   BREAST BIOPSY Left 11/24/2020   u/s bx 10:30/8 cmfn-"heart" marker   BREAST EXCISIONAL BIOPSY Right    neg   DILATION AND CURETTAGE OF UTERUS     EYE SURGERY  2004   Salpingo-oophorectomy      Allergies  Allergen Reactions   Morphine And Related Other (See Comments)    Hypotension   Latex Rash    Current Outpatient Medications  Medication Sig Dispense Refill   acetaminophen (TYLENOL) 650 MG CR tablet Take 650 mg by  mouth 2 (two) times a day.     aspirin 81 MG tablet Take 81 mg by mouth daily.     Cholecalciferol (VITAMIN D3 PO) Take 5,000 Units by mouth daily.     GLUCOSAMINE HCL PO Take by mouth.     losartan (COZAAR) 50 MG tablet Take 1 tablet (50 mg total) by mouth daily. 90 tablet 1   lovastatin (MEVACOR) 10 MG tablet Take 1 tablet (10 mg total) by mouth daily. 90 tablet 1   Menthol, Topical Analgesic, (ICY HOT EX) Apply topically.     metFORMIN (GLUCOPHAGE-XR) 500 MG 24 hr tablet Take 1 tablet (500 mg total) by mouth 2 (two) times daily. 180 tablet 1   Multiple Vitamin (MULTIVITAMIN) tablet Take 1 tablet by mouth daily.     Omega-3 Fatty Acids (FISH OIL OMEGA-3 PO) Take by mouth daily.     spironolactone (ALDACTONE) 50 MG tablet Take 1 tablet (50 mg total) by mouth daily. 90 tablet 1   No current facility-administered medications for this visit.    Family History Family History  Problem Relation Age of Onset   Breast cancer Mother 48   Heart disease Father    Cancer Paternal Grandfather    Heart disease Paternal Grandfather       Social History Social History     Tobacco Use   Smoking status: Never   Smokeless tobacco: Never  Vaping Use   Vaping Use: Never used  Substance Use Topics   Alcohol use: Not Currently   Drug use: Never        Review of Systems  Constitutional: Negative.   HENT: Negative.    Eyes: Negative.   Respiratory: Negative.    Cardiovascular: Negative.   Gastrointestinal: Negative.   Genitourinary: Negative.   Skin: Negative.   Neurological: Negative.   Psychiatric/Behavioral: Negative.       Physical Exam Blood pressure 129/81, pulse 83, temperature 98.7 F (37.1 C), temperature source Oral, height 5' 3" (1.6 m), weight (!) 360 lb (163.3 kg), SpO2 93 %. Last Weight  Most recent update: 12/02/2020  3:15 PM    Weight  163.3 kg (360 lb)               CONSTITUTIONAL: Well developed, morbidly obese and adequately nourished, appropriately responsive  and aware without distress.  Presented in wheelchair, laying supine on exam table the entire duration. EYES: Sclera non-icteric.   EARS, NOSE, MOUTH AND THROAT: Mask worn.  Hearing is intact to voice.  NECK: Trachea is midline, and there is no visible jugular venous distension.  LYMPH NODES:  Lymph nodes in the neck are not appreciable. RESPIRATORY:  Lungs are clear, and breath sounds are equal bilaterally. Normal respiratory effort without pathologic use of accessory muscles. CARDIOVASCULAR: Heart is regular in rate and rhythm. GI: The abdomen is well rounded, soft, nontender, and nondistended.  GU: Breast exam deferred, recent biopsy and bruising noted on patient's left breast. MUSCULOSKELETAL:  Symmetrical muscle tone appreciated in all four extremities.    SKIN: Skin turgor is normal. No pathologic skin lesions appreciated.  NEUROLOGIC:  Motor and sensation appear grossly normal.  Cranial nerves are grossly without defect. PSYCH:  Alert and oriented to person, place and time. Affect is appropriate for situation.  Data Reviewed I have personally reviewed what is currently available of the patient's imaging, recent labs and medical records.   Labs:  CBC Latest Ref Rng & Units 08/22/2020 02/21/2020  WBC 3.4 - 10.8 x10E3/uL 7.0 7.6  Hemoglobin 11.1 - 15.9 g/dL 15.4 15.6  Hematocrit 34.0 - 46.6 % 45.6 47.7(H)  Platelets 150 - 450 x10E3/uL 208 235   CMP Latest Ref Rng & Units 08/22/2020 02/21/2020 08/20/2019  Glucose 65 - 99 mg/dL 135(H) 124(H) 131(H)  BUN 8 - 27 mg/dL 25 23 28(H)  Creatinine 0.57 - 1.00 mg/dL 0.80 0.77 0.82  Sodium 134 - 144 mmol/L 139 139 140  Potassium 3.5 - 5.2 mmol/L 4.7 4.9 5.0  Chloride 96 - 106 mmol/L 99 99 100  CO2 20 - 29 mmol/L _0 Calcium 8.7 - 10.3 mg/dL 9.3 9.4 9.6  Total Protein 6.0 - 8.5 g/dL 7.2 7.4 6.9  Total Bilirubin 0.0 - 1.2 mg/dL 0.4 0.4 0.4  Alkaline Phos 44 - 121 IU/L 58 69 62  AST 0 - 40 IU/L _1 ALT 0 - 32 IU/L _2 SURGICAL PATHOLOGY  * THIS IS AN ADDENDUM REPORT *  CASE: [id number]  PATIENT: Sharon Ware  Surgical Pathology Report  *Addendum *   Reason for Addendum #1:  Breast Biomarker Results   Specimen Submitted:  A. Breast, left, 10;30; biopsy   Clinical History: Left breast with upper inner quadrant mammographic  distortion.  Ultrasound shows corresponding 1.2 cm mass at the 10:30 position 8 cm  from the nipple.  Suspicious mass, rule out malignancy.  Post biopsy mammograms show appropriate positioning of the heart-shaped  biopsy marking clip at  the site of biopsy in the upper inner quadrant of the left breast.      DIAGNOSIS:  A. BREAST, LEFT, 10:30; ULTRASOUND-GUIDED CORE BIOPSY:  - INVASIVE MAMMARY CARCINOMA WITH FEATURES OF TUBULAR CARCINOMA, SEE  COMMENT.   Size of invasive carcinoma: 4 mm in this sample  Histologic grade of invasive carcinoma: Grade 1                       Glandular/tubular differentiation score: 1                       Nuclear pleomorphism score: 1                       Mitotic rate score: 1                       Total score: 3  Ductal carcinoma in situ (DCIS): Present, low-grade  Lymphovascular invasion: Not identified   ER/PR/HER2: Immunohistochemistry will be performed on block A2, with  reflex to FISH for HER2 2+. The results will be reported in an addendum.   Comment:  This may be a pure tubular carcinoma.   The definitive grade, classification, and tumor size should be assigned  on the excisional specimen.   In this sample the invasive tumor consists of scattered tubules  associated with sclerotic stroma, with a few foci of DCIS.   Immunohistochemistry (IHC) was performed. Based on p63 and calponin  staining, the neoplastic tubules are negative for myoepithelial cells,  with positive internal controls. The findings are compatible with the  above diagnosis.   IHC slides were prepared by [facility] [place], [place]. All controls  stained  appropriately.   This test was developed and its performance characteristics determined  by [facility]. It has not been cleared or approved by the US Food and Drug  Administration. The FDA does not require this test to go through  premarket FDA review. This test is used for clinical purposes. It should  not be regarded as investigational or for research. This laboratory is  certified under the Clinical Laboratory Improvement Amendments (CLIA) as  qualified to perform high complexity clinical laboratory testing.   GROSS DESCRIPTION:  A. Labeled: Left breast 10:30 upper inner quadrant  Received: Formalin  Time/date in fixative: Collected and placed in formalin at 9:30 AM on  11/24/2020  Cold ischemic time: Less than 1 minute  Total fixation time: Approximately 10.25 hours  Core pieces: 5 cores and 1 additional fragment  Size: Range from 0.9-1.5 cm in length and 0.2 cm in diameter  Description: Received are cores and a fragment of yellow fibrofatty  tissue.  The additional fragment is 0.3 x 0.2 x 0.1 cm.  Ink color: Blue  Entirely submitted in cassettes 1-2 with 3 cores in cassette 1 and 2  cores with the remaining fragment in cassette 2.    RB 11/24/2020    Final Diagnosis performed by Mary Olney, MD.   Electronically signed  11/26/2020 1:14:25PM  The electronic signature indicates that the named Attending Pathologist  has evaluated the specimen  Technical component performed at [facility], [address], [place],  [place] [address] Lab: [phone] Dir: Sanjai Nagendra, MD, MMM   Professional component performed at [facility], [facility], [address], [place],  [address] Lab: [phone]  Dir: Tara C. Rubinas, MD   ADDENDUM:  CASE SUMMARY: BREAST BIOMARKER TESTS  Estrogen Receptor (ER) Status: POSITIVE          Percentage of cells with nuclear positivity: Greater than 90%          Average intensity of staining: Strong   Progesterone Receptor  (PgR) Status: POSITIVE          Percentage of cells with nuclear positivity: Greater than 90%          Average intensity of staining: Strong   HER2 (by immunohistochemistry): NEGATIVE (Score 0)   Cold Ischemia and Fixation Times: Meet requirements specified in latest  version of the ASCO/CAP guidelines    Imaging: Radiology review:   CLINICAL DATA:  70-year-old female recalled from screening mammogram dated 11/10/2020 for possible left breast distortion.   EXAM: DIGITAL DIAGNOSTIC UNILATERAL LEFT MAMMOGRAM WITH TOMOSYNTHESIS AND CAD; ULTRASOUND LEFT BREAST LIMITED   TECHNIQUE: Left digital diagnostic mammography and breast tomosynthesis was performed. The images were evaluated with computer-aided detection.; Targeted ultrasound examination of the left breast was performed.   COMPARISON:  Previous exam(s).   ACR Breast Density Category c: The breast tissue is heterogeneously dense, which may obscure small masses.   FINDINGS: There is persistent distortion in the upper inner quadrant of the left breast at posterior depth. Further evaluation ultrasound was performed.   Targeted ultrasound is performed, showing an irregular shadowing hypoechoic mass at the 10:30 position 8 cm from the nipple. It measures 1.2 x 0.8 x 0.7 cm. There is no definite associated vascularity. This correlates well with the mammographically identified distortion. Evaluation of the left axilla demonstrates no suspicious lymphadenopathy.   IMPRESSION: 1. Suspicious left breast mass corresponding with the mammographically identified distortion. Recommendation is for ultrasound-guided biopsy. 2. No suspicious left axillary lymphadenopathy.   RECOMMENDATION: Ultrasound-guided biopsy of the left breast.   I have discussed the findings and recommendations with the patient. If applicable, a reminder letter will be sent to the patient regarding the next appointment.   BI-RADS CATEGORY  4:  Suspicious.     Electronically Signed   By: Serena  Chacko M.D.   On: 11/17/2020 15:08 Within last 24 hrs: No results found.  Assessment    Malignant neoplasm of upper-inner quadrant left breast, ER/PR positive, HER2 negative, low-grade. Ultrasound negative for lymphadenopathy. Patient Active Problem List   Diagnosis Date Noted   Malignant neoplasm of upper-inner quadrant of left female breast (HCC) 12/01/2020   Goals of care, counseling/discussion 12/01/2020   Morbid obesity (HCC) 08/08/2018   BMI 60.0-69.9, adult (HCC) 08/08/2018   Diabetes mellitus without complication (HCC) 08/08/2018   Hyperlipidemia 08/08/2018   Hypertension associated with diabetes (HCC) 08/08/2018   Arthritis of both knees 08/08/2018   Vitamin D deficiency 08/08/2018    Plan    RF ID tag left breast lumpectomy. I believe the morbidity of a sentinel lymph node biopsy far exceeds its benefit and prognostic indicators, and considering choosing wisely.  I discussed the available options with the patient. The risk of recurrence is similar between mastectomy and lumpectomy with radiation.  I also discussed that given the small size of the cancer, and its low-grade and low risk prognostic indicators, would recommend localization lumpectomy with probable radiation to follow.  I indicated I would prefer to defer/avoid a sentinel lymph node biopsy.   Explained to the patient that after her surgical treatment additional treatment will be better determined.      I discussed risks of bleeding, infection, damage to surrounding tissues, having positive margins, needing further resection, damage to nerves causing arm numbness or difficulty raising arm, causing lymphedema in the arm; as well as anesthesia risks of MI, stroke, prolonged ventilation, pulmonary embolism, thrombosis and even death.   Patient was given the opportunity to ask questions and have them answered.  They would like to proceed with left breast RFID localized  lumpectomy.    Face-to-face time spent with the patient and accompanying care providers(if present) was 45 minutes, with more than 50% of the time spent counseling, educating, and coordinating care of the patient.    These notes generated with voice recognition software. I apologize for typographical errors.  Daved Mcfann M.D., FACS 12/02/2020, 3:31 PM     

## 2020-12-02 NOTE — Patient Instructions (Addendum)
Stop the Aspirin and the fish oil.   Our surgery scheduler will call you within 24-48 hours to schedule your surgery. Please have the [facility] surgery sheet available when speaking with her.    Lumpectomy  A lumpectomy, sometimes called a partial mastectomy, is surgery to remove a cancerous tumor or mass (the lump) from a breast. It is a form of breast-conserving or breast-preservation surgery. This means that the canceroustissue is removed but the breast remains intact. During a lumpectomy, the portion of the breast that contains the tumor is removed. Some normal tissue around the lump may be taken out to make sure that all of the tumor has been removed. Lymph nodes under your arm may also be removed and tested to find out if the cancer has spread. Lymph nodes are part of the body's disease-fighting system (immune system) and are usually the first place where breast cancer spreads. Tell a health care provider about: Any allergies you have. All medicines you are taking, including vitamins, herbs, eye drops, creams, and over-the-counter medicines. Any problems you or family members have had with anesthetic medicines. Any blood disorders you have. Any surgeries you have had. Any medical conditions you have. Whether you are pregnant or may be pregnant. What are the risks? Generally, this is a safe procedure. However, problems may occur, including: Bleeding. Infection. Allergic reaction to medicines. Pain, swelling, weakness, or numbness in the arm on the side of your surgery. Temporary swelling. Change in the shape of the breast, particularly if a large portion is removed. Scar tissue that forms at the surgical site and feels hard to the touch. Blood clots. What happens before the procedure? Staying hydrated Follow instructions from your health care provider about hydration, which may include: Up to 2 hours before the procedure - you may continue to drink clear liquids, such as water, clear  fruit juice, black coffee, and plain tea.  Eating and drinking restrictions Follow instructions from your health care provider about eating and drinking, which may include: 8 hours before the procedure - stop eating heavy meals or foods, such as meat, fried foods, or fatty foods. 6 hours before the procedure - stop eating light meals or foods, such as toast or cereal. 6 hours before the procedure - stop drinking milk or drinks that contain milk. 2 hours before the procedure - stop drinking clear liquids. Medicines Ask your health care provider about: Changing or stopping your regular medicines. This is especially important if you are taking diabetes medicines or blood thinners. Taking medicines such as aspirin and ibuprofen. These medicines can thin your blood. Do not take these medicines unless your health care provider tells you to take them. Taking over-the-counter medicines, vitamins, herbs, and supplements. General instructions Prior to surgery, your health care provider may do a procedure to locate and mark the tumor area in your breast (localization). This will help guide your surgeon to where the incision will be made. This may be done with: Imaging, such as a mammogram, ultrasound, or MRI. Insertion of a small wire, clip, or seed, or an implant that will reflect a radar signal. You may have screening tests or exams to get baseline measurements of your arm. These can be compared to measurements done after surgery to monitor for swelling (lymphedema) that can develop after having lymph nodes removed. Ask your health care provider: How your surgery site will be marked. What steps will be taken to help prevent infection. These may include: Washing skin with a germ-killing soap.  Taking antibiotic medicine. Plan to have someone take you home from the hospital or clinic. Plan to have a responsible adult care for you for at least 24 hours after you leave the hospital or clinic. This is  important. What happens during the procedure?  An IV will be inserted into one of your veins. You will be given one or more of the following: A medicine to help you relax (sedative). A medicine to numb the area (local anesthetic). A medicine to make you fall asleep (general anesthetic). Your health care provider will use a kind of electric scalpel that uses heat to reduce bleeding (electrocautery knife). A curved incision that follows the natural curve of your breast will be made. This type of incision will allow for minimal scarring and better healing. The tumor will be removed along with some of the tissue around it. This will be sent to the lab for testing. Your health care provider may also remove lymph nodes at this time if needed. If the tumor is close to the muscles over your chest, some muscle tissue may also be removed. A small drain tube may be inserted into your breast area or armpit to collect fluid that may build up after surgery. This tube will be connected to a suction bulb on the outside of your body to remove the fluid. The incision will be closed with stitches (sutures). A bandage (dressing) may be placed over the incision. The procedure may vary among health care providers and hospitals. What happens after the procedure? Your blood pressure, heart rate, breathing rate, and blood oxygen level will be monitored until you leave the hospital or clinic. You will be given medicine for pain as needed. Your IV will be removed when you are able to eat and drink by mouth. You will be encouraged to get up and walk as soon as you can. This is important to improve blood flow and breathing. Ask for help if you feel weak or unsteady. You may have: A drain tube in place for 2-3 days to prevent a collection of blood (hematoma) from developing in the breast. You will be given instructions about caring for the drain before you go home. A pressure bandage applied for 1-2 days to prevent bleeding  or swelling. Your pressure bandage may look like a thick piece of fabric or an elastic wrap. Ask your health care provider how to care for your bandage at home. You may be given a tight sleeve to wear over your arm on the side of your surgery. You should wear this sleeve as told by your health care provider. Do not drive for 24 hours if you were given a sedative during your procedure. Summary A lumpectomy, sometimes called a partial mastectomy, is surgery to remove a cancerous tumor or mass (the lump) from a breast. During a lumpectomy, the portion of the breast that contains the tumor is removed. Lymph nodes under your arm may also be removed and tested to find out if the cancer has spread. Plan to have someone take you home from the hospital or clinic. You may have a drain tube in place for 2-3 days to prevent a collection of blood (hematoma) from developing in the breast. You will be given instructions about caring for the drain before you go home. This information is not intended to replace advice given to you by your health care provider. Make sure you discuss any questions you have with your healthcare provider. Document Revised: 09/25/2018 Document Reviewed: 09/25/2018  Elsevier Patient Education  2022 [employer]  

## 2020-12-02 NOTE — Progress Notes (Signed)
Patient ID: Sharon Ware, female   DOB: [birth year], 70 y.o.   MRN: [id number]  Chief Complaint: Left breast cancer  History of Present Illness Sharon Ware is a 70 y.o. female with left breast cancer recently diagnosed following screening mammography.  Patient has a family history having her mother who was diagnosed at the age of 28 and passing away at the age of 51.  She has had previous breast lumpectomies in the past, none that would increase her risk of eventual breast cancer.  She took birth control for 3 months back in the 59s.  She has had a complete hysterectomy.  She underwent menarche at the age of 43, had 2 children the first was delivered at the age of 73.  She only breast-fed her second child she is never felt a lump, had any skin changes breast pain or nipple discharge.  She does do monthly breast self-examinations and has been following through with annual mammography. She saw Dr. Janese Ware yesterday.  We raised the point of applying choosing wisely to omit sentinel lymph node biopsy.  On ultrasound her left breast lesion was measured at 1.2 cm, at 1030 o'clock, 8 cm from the nipple.  Past Medical History Past Medical History:  Diagnosis Date   Arthritis    Breast cancer ([place])    Cataract    Diabetes mellitus without complication ([place])    Hyperlipidemia    Hypertension    Vitamin D deficiency       Past Surgical History:  Procedure Laterality Date   ABDOMINAL HYSTERECTOMY     BREAST BIOPSY Right 1990s   neg   BREAST BIOPSY Right 2000s   neg   BREAST BIOPSY Left 11/24/2020   u/s bx 10:30/8 cmfn-"heart" marker   BREAST EXCISIONAL BIOPSY Right    neg   DILATION AND CURETTAGE OF UTERUS     EYE SURGERY  2004   Salpingo-oophorectomy      Allergies  Allergen Reactions   Morphine And Related Other (See Comments)    Hypotension   Latex Rash    Current Outpatient Medications  Medication Sig Dispense Refill   acetaminophen (TYLENOL) 650 MG CR tablet Take 650 mg by  mouth 2 (two) times a day.     aspirin 81 MG tablet Take 81 mg by mouth daily.     Cholecalciferol (VITAMIN D3 PO) Take 5,000 Units by mouth daily.     GLUCOSAMINE HCL PO Take by mouth.     losartan (COZAAR) 50 MG tablet Take 1 tablet (50 mg total) by mouth daily. 90 tablet 1   lovastatin (MEVACOR) 10 MG tablet Take 1 tablet (10 mg total) by mouth daily. 90 tablet 1   Menthol, Topical Analgesic, (ICY HOT EX) Apply topically.     metFORMIN (GLUCOPHAGE-XR) 500 MG 24 hr tablet Take 1 tablet (500 mg total) by mouth 2 (two) times daily. 180 tablet 1   Multiple Vitamin (MULTIVITAMIN) tablet Take 1 tablet by mouth daily.     Omega-3 Fatty Acids (FISH OIL OMEGA-3 PO) Take by mouth daily.     spironolactone (ALDACTONE) 50 MG tablet Take 1 tablet (50 mg total) by mouth daily. 90 tablet 1   No current facility-administered medications for this visit.    Family History Family History  Problem Relation Age of Onset   Breast cancer Mother 25   Heart disease Father    Cancer Paternal Grandfather    Heart disease Paternal Grandfather       Social History Social History  Tobacco Use   Smoking status: Never   Smokeless tobacco: Never  Vaping Use   Vaping Use: Never used  Substance Use Topics   Alcohol use: Not Currently   Drug use: Never        Review of Systems  Constitutional: Negative.   HENT: Negative.    Eyes: Negative.   Respiratory: Negative.    Cardiovascular: Negative.   Gastrointestinal: Negative.   Genitourinary: Negative.   Skin: Negative.   Neurological: Negative.   Psychiatric/Behavioral: Negative.       Physical Exam Blood pressure 129/81, pulse 83, temperature 98.7 F (37.1 C), temperature source Oral, height 5' 3" (1.6 m), weight (!) 360 lb (163.3 kg), SpO2 93 %. Last Weight  Most recent update: 12/02/2020  3:15 PM    Weight  163.3 kg (360 lb)               CONSTITUTIONAL: Well developed, morbidly obese and adequately nourished, appropriately responsive  and aware without distress.  Presented in wheelchair, laying supine on exam table the entire duration. EYES: Sclera non-icteric.   EARS, NOSE, MOUTH AND THROAT: Mask worn.  Hearing is intact to voice.  NECK: Trachea is midline, and there is no visible jugular venous distension.  LYMPH NODES:  Lymph nodes in the neck are not appreciable. RESPIRATORY:  Lungs are clear, and breath sounds are equal bilaterally. Normal respiratory effort without pathologic use of accessory muscles. CARDIOVASCULAR: Heart is regular in rate and rhythm. GI: The abdomen is well rounded, soft, nontender, and nondistended.  GU: Breast exam deferred, recent biopsy and bruising noted on patient's left breast. MUSCULOSKELETAL:  Symmetrical muscle tone appreciated in all four extremities.    SKIN: Skin turgor is normal. No pathologic skin lesions appreciated.  NEUROLOGIC:  Motor and sensation appear grossly normal.  Cranial nerves are grossly without defect. PSYCH:  Alert and oriented to person, place and time. Affect is appropriate for situation.  Data Reviewed I have personally reviewed what is currently available of the patient's imaging, recent labs and medical records.   Labs:  CBC Latest Ref Rng & Units 08/22/2020 02/21/2020  WBC 3.4 - 10.8 x10E3/uL 7.0 7.6  Hemoglobin 11.1 - 15.9 g/dL 15.4 15.6  Hematocrit 34.0 - 46.6 % 45.6 47.7(H)  Platelets 150 - 450 x10E3/uL 208 235   CMP Latest Ref Rng & Units 08/22/2020 02/21/2020 08/20/2019  Glucose 65 - 99 mg/dL 135(H) 124(H) 131(H)  BUN 8 - 27 mg/dL 25 23 28(H)  Creatinine 0.57 - 1.00 mg/dL 0.80 0.77 0.82  Sodium 134 - 144 mmol/L 139 139 140  Potassium 3.5 - 5.2 mmol/L 4.7 4.9 5.0  Chloride 96 - 106 mmol/L 99 99 100  CO2 20 - 29 mmol/L _0 Calcium 8.7 - 10.3 mg/dL 9.3 9.4 9.6  Total Protein 6.0 - 8.5 g/dL 7.2 7.4 6.9  Total Bilirubin 0.0 - 1.2 mg/dL 0.4 0.4 0.4  Alkaline Phos 44 - 121 IU/L 58 69 62  AST 0 - 40 IU/L _1 ALT 0 - 32 IU/L _2 SURGICAL PATHOLOGY  * THIS IS AN ADDENDUM REPORT *  CASE: [id number]  PATIENT: Sharon Ware  Surgical Pathology Report  *Addendum *   Reason for Addendum #1:  Breast Biomarker Results   Specimen Submitted:  A. Breast, left, 10;30; biopsy   Clinical History: Left breast with upper inner quadrant mammographic  distortion.  Ultrasound shows corresponding 1.2 cm mass at the 10:30 position 8 cm  from the nipple.  Suspicious mass, rule out malignancy.  Post biopsy mammograms show appropriate positioning of the heart-shaped  biopsy marking clip at  the site of biopsy in the upper inner quadrant of the left breast.      DIAGNOSIS:  A. BREAST, LEFT, 10:30; ULTRASOUND-GUIDED CORE BIOPSY:  - INVASIVE MAMMARY CARCINOMA WITH FEATURES OF TUBULAR CARCINOMA, SEE  COMMENT.   Size of invasive carcinoma: 4 mm in this sample  Histologic grade of invasive carcinoma: Grade 1                       Glandular/tubular differentiation score: 1                       Nuclear pleomorphism score: 1                       Mitotic rate score: 1                       Total score: 3  Ductal carcinoma in situ (DCIS): Present, low-grade  Lymphovascular invasion: Not identified   ER/PR/HER2: Immunohistochemistry will be performed on block A2, with  reflex to [place] for HER2 2+. The results will be reported in an addendum.   Comment:  This may be a pure tubular carcinoma.   The definitive grade, classification, and tumor size should be assigned  on the excisional specimen.   In this sample the invasive tumor consists of scattered tubules  associated with sclerotic stroma, with a few foci of DCIS.   Immunohistochemistry (IHC) was performed. Based on p63 and calponin  staining, the neoplastic tubules are negative for myoepithelial cells,  with positive internal controls. The findings are compatible with the  above diagnosis.   IHC slides were prepared by Launa Grill, [place]. All controls  stained  appropriately.   This test was developed and its performance characteristics determined  by [facility]. It has not been cleared or approved by the [place] Food and Drug  Administration. The FDA does not require this test to go through  premarket FDA review. This test is used for clinical purposes. It should  not be regarded as investigational or for research. This laboratory is  certified under the Clinical Laboratory Improvement Amendments (CLIA) as  qualified to perform high complexity clinical laboratory testing.   GROSS DESCRIPTION:  A. Labeled: Left breast 10:30 upper inner quadrant  Received: Formalin  Time/date in fixative: Collected and placed in formalin at 9:30 AM on  11/24/2020  Cold ischemic time: Less than 1 minute  Total fixation time: Approximately 10.25 hours  Core pieces: 5 cores and 1 additional fragment  Size: Range from 0.9-1.5 cm in length and 0.2 cm in diameter  Description: Received are cores and a fragment of yellow fibrofatty  tissue.  The additional fragment is 0.3 x 0.2 x 0.1 cm.  Ink color: Blue  Entirely submitted in cassettes 1-2 with 3 cores in cassette 1 and 2  cores with the remaining fragment in cassette 2.    RB 11/24/2020    Final Diagnosis performed by Bryan Lemma, MD.   Electronically signed  11/26/2020 1:14:25PM  The electronic signature indicates that the named Attending Pathologist  has evaluated the specimen  Technical component performed at [facility], [address] [place],  [place] [address] Lab: [phone] Dir: Rush Farmer, MD, MMM   Professional component performed at [facility], [facility], [phone]  [place], [place], [place] [address] Lab: [phone]  Dir: Dellia Nims. Rubinas, MD   ADDENDUM:  CASE SUMMARY: BREAST BIOMARKER TESTS  Estrogen Receptor (ER) Status: POSITIVE          Percentage of cells with nuclear positivity: Greater than 90%          Average intensity of staining: Strong   Progesterone Receptor  (PgR) Status: POSITIVE          Percentage of cells with nuclear positivity: Greater than 90%          Average intensity of staining: Strong   HER2 (by immunohistochemistry): NEGATIVE (Score 0)   Cold Ischemia and Fixation Times: Meet requirements specified in latest  version of the ASCO/CAP guidelines    Imaging: Radiology review:   CLINICAL DATA:  70 year old female recalled from screening mammogram dated 11/10/2020 for possible left breast distortion.   EXAM: DIGITAL DIAGNOSTIC UNILATERAL LEFT MAMMOGRAM WITH TOMOSYNTHESIS AND CAD; ULTRASOUND LEFT BREAST LIMITED   TECHNIQUE: Left digital diagnostic mammography and breast tomosynthesis was performed. The images were evaluated with computer-aided detection.; Targeted ultrasound examination of the left breast was performed.   COMPARISON:  Previous exam(s).   ACR Breast Density Category c: The breast tissue is heterogeneously dense, which may obscure small masses.   FINDINGS: There is persistent distortion in the upper inner quadrant of the left breast at posterior depth. Further evaluation ultrasound was performed.   Targeted ultrasound is performed, showing an irregular shadowing hypoechoic mass at the 10:30 position 8 cm from the nipple. It measures 1.2 x 0.8 x 0.7 cm. There is no definite associated vascularity. This correlates well with the mammographically identified distortion. Evaluation of the left axilla demonstrates no suspicious lymphadenopathy.   IMPRESSION: 1. Suspicious left breast mass corresponding with the mammographically identified distortion. Recommendation is for ultrasound-guided biopsy. 2. No suspicious left axillary lymphadenopathy.   RECOMMENDATION: Ultrasound-guided biopsy of the left breast.   I have discussed the findings and recommendations with the patient. If applicable, a reminder letter will be sent to the patient regarding the next appointment.   BI-RADS CATEGORY  4:  Suspicious.     Electronically Signed   By: Kristopher Oppenheim M.D.   On: 11/17/2020 15:08 Within last 24 hrs: No results found.  Assessment    Malignant neoplasm of upper-inner quadrant left breast, ER/PR positive, HER2 negative, low-grade. Ultrasound negative for lymphadenopathy. Patient Active Problem List   Diagnosis Date Noted   Malignant neoplasm of upper-inner quadrant of left female breast ([place]) 12/01/2020   Goals of care, counseling/discussion 12/01/2020   Morbid obesity ([place]) 08/08/2018   BMI 60.0-69.9, adult ([place]) 08/08/2018   Diabetes mellitus without complication ([place]) 20/35/5974   Hyperlipidemia 08/08/2018   Hypertension associated with diabetes ([place]) 08/08/2018   Arthritis of both knees 08/08/2018   Vitamin D deficiency 08/08/2018    Plan    RF ID tag left breast lumpectomy. I believe the morbidity of a sentinel lymph node biopsy far exceeds its benefit and prognostic indicators, and considering choosing wisely.  I discussed the available options with the patient. The risk of recurrence is similar between mastectomy and lumpectomy with radiation.  I also discussed that given the small size of the cancer, and its low-grade and low risk prognostic indicators, would recommend localization lumpectomy with probable radiation to follow.  I indicated I would prefer to defer/avoid a sentinel lymph node biopsy.   Explained to the patient that after her surgical treatment additional treatment will be better determined.  I discussed risks of bleeding, infection, damage to surrounding tissues, having positive margins, needing further resection, damage to nerves causing arm numbness or difficulty raising arm, causing lymphedema in the arm; as well as anesthesia risks of MI, stroke, prolonged ventilation, pulmonary embolism, thrombosis and even death.   Patient was given the opportunity to ask questions and have them answered.  They would like to proceed with left breast RFID localized  lumpectomy.    Face-to-face time spent with the patient and accompanying care providers(if present) was 45 minutes, with more than 50% of the time spent counseling, educating, and coordinating care of the patient.    These notes generated with voice recognition software. I apologize for typographical errors.  Ronny Bacon M.D., FACS 12/02/2020, 3:31 PM

## 2020-12-03 ENCOUNTER — Other Ambulatory Visit: Payer: Self-pay | Admitting: Surgery

## 2020-12-03 ENCOUNTER — Telehealth: Payer: Self-pay | Admitting: Surgery

## 2020-12-03 DIAGNOSIS — C50919 Malignant neoplasm of unspecified site of unspecified female breast: Secondary | ICD-10-CM

## 2020-12-03 NOTE — Telephone Encounter (Signed)
Spoke with patient and her husband, Jeneen Rinks.  They both have been informed of Pre-Admission date/time, COVID Testing date and Surgery date.  Surgery Date: 12/10/20 Preadmission Testing Date: 12/05/20 (phone 8a-1p) Covid Testing Date: Not needed.     They have been advised to call at  [phone], between 1-3:00pm the day before surgery, to find out what time to arrive for surgery.   They were also reminded of RF tag placement scheduled at [facility] for 12/09/20.   They verbalized understanding with the above.

## 2020-12-03 NOTE — Progress Notes (Signed)
Phoned patient with receptor results per Dr. Janese Banks.  Informed patient that she will be receiving follow-up appointment information.  States her surgery date has not been scheduled.  Dr. Forest Becker office called during this phone call.

## 2020-12-04 ENCOUNTER — Other Ambulatory Visit: Payer: Self-pay

## 2020-12-04 ENCOUNTER — Encounter: Payer: Self-pay | Admitting: Family Medicine

## 2020-12-04 ENCOUNTER — Ambulatory Visit (INDEPENDENT_AMBULATORY_CARE_PROVIDER_SITE_OTHER): Payer: Medicare Other | Admitting: Family Medicine

## 2020-12-04 VITALS — BP 129/84 | HR 73 | Temp 98.4°F

## 2020-12-04 DIAGNOSIS — E119 Type 2 diabetes mellitus without complications: Secondary | ICD-10-CM | POA: Diagnosis not present

## 2020-12-04 DIAGNOSIS — Z170 Estrogen receptor positive status [ER+]: Secondary | ICD-10-CM

## 2020-12-04 DIAGNOSIS — C50212 Malignant neoplasm of upper-inner quadrant of left female breast: Secondary | ICD-10-CM | POA: Diagnosis not present

## 2020-12-04 LAB — MICROALBUMIN, URINE WAIVED
Creatinine, Urine Waived: 200 mg/dL (ref 10–300)
Microalb, Ur Waived: 30 mg/L — ABNORMAL HIGH (ref 0–19)
Microalb/Creat Ratio: <30 mg/g (ref <30)

## 2020-12-04 LAB — BAYER DCA HB A1C WAIVED: HB A1C (BAYER DCA - WAIVED): 6.7 % (ref <7.0)

## 2020-12-04 MED ORDER — LOVASTATIN 10 MG PO TABS: 10.0000 mg | ORAL_TABLET | Freq: Every day | ORAL | 1 refills | Status: DC

## 2020-12-04 MED ORDER — LOSARTAN POTASSIUM 50 MG PO TABS: 50.0000 mg | ORAL_TABLET | Freq: Every day | ORAL | 1 refills | Status: DC

## 2020-12-04 MED ORDER — METFORMIN HCL ER 500 MG PO TB24: 500.0000 mg | ORAL_TABLET | Freq: Two times a day (BID) | ORAL | 1 refills | Status: DC

## 2020-12-04 MED ORDER — SPIRONOLACTONE 50 MG PO TABS: 50.0000 mg | ORAL_TABLET | Freq: Every day | ORAL | 1 refills | Status: DC

## 2020-12-04 NOTE — Assessment & Plan Note (Signed)
Following with surgery and oncology. Newly diagnosed. To have surgery next week.

## 2020-12-04 NOTE — Progress Notes (Signed)
BP 129/84   Pulse 73   Temp 98.4 F (36.9 C) (Oral)   SpO2 95%    Subjective:    Patient ID: Sharon Ware, female    DOB: 10-13-1950, 70 y.o.   MRN: [id number]  HPI: Sharon Ware is a 70 y.o. female  Chief Complaint  Patient presents with   Diabetes   DIABETES Hypoglycemic episodes:no Polydipsia/polyuria: no Visual disturbance: no Chest pain: no Paresthesias: no Glucose Monitoring: no  Accucheck frequency: Not Checking Taking Insulin?: no Blood Pressure Monitoring: not checking Retinal Examination: Up to Date Foot Exam: Up to Date Diabetic Education: Completed Pneumovax: Up to Date Influenza: Up to Date Aspirin: yes  Relevant past medical, surgical, family and social history reviewed and updated as indicated. Interim medical history since our last visit reviewed. Allergies and medications reviewed and updated.  Review of Systems  Constitutional: Negative.   Respiratory: Negative.    Cardiovascular: Negative.   Gastrointestinal: Negative.   Musculoskeletal: Negative.   Neurological: Negative.   Psychiatric/Behavioral: Negative.     Per HPI unless specifically indicated above     Objective:    BP 129/84   Pulse 73   Temp 98.4 F (36.9 C) (Oral)   SpO2 95%   Wt Readings from Last 3 Encounters:  12/02/20 (!) 360 lb (163.3 kg)  12/01/20 (!) 360 lb (163.3 kg)  10/24/20 (!) 340 lb (154.2 kg)    Physical Exam Vitals and nursing note reviewed.  Constitutional:      General: She is not in acute distress.    Appearance: Normal appearance. She is not ill-appearing, toxic-appearing or diaphoretic.  HENT:     Head: Normocephalic and atraumatic.     Right Ear: External ear normal.     Left Ear: External ear normal.     Nose: Nose normal.     Mouth/Throat:     Mouth: Mucous membranes are moist.     Pharynx: Oropharynx is clear.  Eyes:     General: No scleral icterus.       Right eye: No discharge.        Left eye: No discharge.     Extraocular  Movements: Extraocular movements intact.     Conjunctiva/sclera: Conjunctivae normal.     Pupils: Pupils are equal, round, and reactive to light.  Cardiovascular:     Rate and Rhythm: Normal rate and regular rhythm.     Pulses: Normal pulses.     Heart sounds: Normal heart sounds. No murmur heard.   No friction rub. No gallop.  Pulmonary:     Effort: Pulmonary effort is normal. No respiratory distress.     Breath sounds: Normal breath sounds. No stridor. No wheezing, rhonchi or rales.  Chest:     Chest wall: No tenderness.  Musculoskeletal:        General: Normal range of motion.     Cervical back: Normal range of motion and neck supple.  Skin:    General: Skin is warm and dry.     Capillary Refill: Capillary refill takes less than 2 seconds.     Coloration: Skin is not jaundiced or pale.     Findings: No bruising, erythema, lesion or rash.  Neurological:     General: No focal deficit present.     Mental Status: She is alert and oriented to person, place, and time. Mental status is at baseline.  Psychiatric:        Mood and Affect: Mood normal.  Behavior: Behavior normal.        Thought Content: Thought content normal.        Judgment: Judgment normal.        Assessment & Plan:   Problem List Items Addressed This Visit       Endocrine   Diabetes mellitus without complication ([place]) - Primary    Doing well with A1c of 6.7. Continue current regimen. Continue to monitor. Call with any concerns.       Relevant Medications   losartan (COZAAR) 50 MG tablet   lovastatin (MEVACOR) 10 MG tablet   metFORMIN (GLUCOPHAGE-XR) 500 MG 24 hr tablet   Other Relevant Orders   Bayer DCA Hb A1c Waived   Microalbumin, Urine Waived     Other   Malignant neoplasm of upper-inner quadrant of left female breast [facility])    Following with surgery and oncology. Newly diagnosed. To have surgery next week.         Follow up plan: Return in about 6 months (around  06/03/2021).

## 2020-12-04 NOTE — Assessment & Plan Note (Signed)
Doing well with A1c of 6.7. Continue current regimen. Continue to monitor. Call with any concerns.

## 2020-12-05 ENCOUNTER — Other Ambulatory Visit
Admission: RE | Admit: 2020-12-05 | Discharge: 2020-12-05 | Disposition: A | Discharge status: Home / Self Care | Payer: Medicare Other | Source: Ambulatory Visit | Attending: Surgery | Admitting: Surgery

## 2020-12-05 NOTE — Patient Instructions (Signed)
Your procedure is scheduled on: Wednesday December 10, 2020. Report to Day Surgery inside [facility] 2nd floor stop by admissions desk first before getting on elevator. To find out your arrival time please call [phone] between 1PM - 3PM on Tuesday December 09, 2020.  Remember: Instructions that are not followed completely may result in serious medical risk,  up to and including death, or upon the discretion of your surgeon and anesthesiologist your  surgery may need to be rescheduled.     _X__ 1. Do not eat food or drink fluids after midnight the night before your procedure.                 No chewing gum or hard candies.   __X__2.  On the morning of surgery brush your teeth with toothpaste and water, you                may rinse your mouth with mouthwash if you wish.  Do not swallow any toothpaste of mouthwash.     _X__ 3.  No Alcohol for 24 hours before or after surgery.   _X__ 4.  Do Not Smoke or use e-cigarettes For 24 Hours Prior to Your Surgery.                 Do not use any chewable tobacco products for at least 6 hours prior to                 Surgery.  _X__  5.  Do not use any recreational drugs (marijuana, cocaine, heroin, ecstasy, MDMA or other)                For at least one week prior to your surgery.  Combination of these drugs with anesthesia                May have life threatening results.  __X__ 6.  Notify your doctor if there is any change in your medical condition      (cold, fever, infections).     Do not wear jewelry, make-up, hairpins, clips or nail polish. Do not wear lotions, powders, or perfumes. You may wear deodorant. Do not shave 48 hours prior to surgery.  Do not bring valuables to the hospital.    [facility] is not responsible for any belongings or valuables.  Contacts, dentures or bridgework may not be worn into surgery. Leave your suitcase in the car. After surgery it may be brought to your room. For patients  admitted to the hospital, discharge time is determined by your treatment team.   Patients discharged the day of surgery will not be allowed to drive home.   Make arrangements for someone to be with you for the first 24 hours of your Same Day Discharge.   __X__ Take these medicines the morning of surgery with A SIP OF WATER:    1. None   2.   3.   4.  5.  6.  ____ Fleet Enema (as directed)   __X__ Use CHG Soap (or wipes) as directed  ____ Use Benzoyl Peroxide Gel as instructed  ____ Use inhalers on the day of surgery  __X__ Stop  metFORMIN (GLUCOPHAGE-XR) 500 MG 2 days prior to surgery (take last dose Sunday 12/07/20)   ____ Take 1/2 of usual insulin dose the night before surgery. No insulin the morning          of surgery.   __X__ You already stopped aspirin 81 MG as  instructed by your doctor.   __X__ One Week prior to surgery- Stop Anti-inflammatories such as Ibuprofen, Aleve, Advil, Motrin, meloxicam (MOBIC), diclofenac, etodolac, ketorolac, Toradol, Daypro, piroxicam, Goody's or BC powders. OK TO USE TYLENOL IF NEEDED   __X__ Stop supplements now until after surgery. Omega-3 Fatty Acids (FISH OIL ULTRA) 1400 MG and Glucosamine HCl 1500 MG    ____ Bring C-Pap to the hospital.    If you have any questions regarding your pre-procedure instructions,  Please call Pre-admit Testing at [phone]

## 2020-12-09 ENCOUNTER — Ambulatory Visit
Admission: RE | Admit: 2020-12-09 | Discharge: 2020-12-09 | Disposition: A | Discharge status: Home / Self Care | Payer: Medicare Other | Source: Ambulatory Visit | Attending: Surgery | Admitting: Surgery

## 2020-12-09 ENCOUNTER — Other Ambulatory Visit: Payer: Self-pay | Admitting: Surgery

## 2020-12-09 ENCOUNTER — Other Ambulatory Visit: Payer: Self-pay

## 2020-12-09 ENCOUNTER — Ambulatory Visit: Admission: RE | Admit: 2020-12-09 | Payer: Medicare Other | Source: Ambulatory Visit

## 2020-12-09 ENCOUNTER — Encounter: Payer: Self-pay | Admitting: Urgent Care

## 2020-12-09 ENCOUNTER — Encounter
Admission: RE | Admit: 2020-12-09 | Discharge: 2020-12-09 | Disposition: A | Discharge status: Home / Self Care | Payer: Medicare Other | Source: Ambulatory Visit | Attending: Surgery | Admitting: Surgery

## 2020-12-09 DIAGNOSIS — R928 Other abnormal and inconclusive findings on diagnostic imaging of breast: Secondary | ICD-10-CM | POA: Insufficient documentation

## 2020-12-09 DIAGNOSIS — C50919 Malignant neoplasm of unspecified site of unspecified female breast: Secondary | ICD-10-CM

## 2020-12-09 DIAGNOSIS — Z01810 Encounter for preprocedural cardiovascular examination: Secondary | ICD-10-CM

## 2020-12-09 LAB — CBC WITH DIFFERENTIAL/PLATELET
Abs Immature Granulocytes: 0.05 10*3/uL (ref 0.00–0.07)
Basophils Absolute: 0.1 10*3/uL (ref 0.0–0.1)
Basophils Relative: 1 %
Eosinophils Absolute: 0.2 10*3/uL (ref 0.0–0.5)
Eosinophils Relative: 2 %
HCT: 48.6 % — ABNORMAL HIGH (ref 36.0–46.0)
Hemoglobin: 16.3 g/dL — ABNORMAL HIGH (ref 12.0–15.0)
Immature Granulocytes: 1 %
Lymphocytes Relative: 31 %
Lymphs Abs: 2.7 10*3/uL (ref 0.7–4.0)
MCH: 30.8 pg (ref 26.0–34.0)
MCHC: 33.5 g/dL (ref 30.0–36.0)
MCV: 91.9 fL (ref 80.0–100.0)
Monocytes Absolute: 0.5 10*3/uL (ref 0.1–1.0)
Monocytes Relative: 6 %
Neutro Abs: 5.1 10*3/uL (ref 1.7–7.7)
Neutrophils Relative %: 59 %
Platelets: 221 10*3/uL (ref 150–400)
RBC: 5.29 MIL/uL — ABNORMAL HIGH (ref 3.87–5.11)
RDW: 12.8 % (ref 11.5–15.5)
WBC: 8.6 10*3/uL (ref 4.0–10.5)
nRBC: 0 % (ref 0.0–0.2)

## 2020-12-09 LAB — COMPREHENSIVE METABOLIC PANEL
ALT: 22 U/L (ref 0–44)
AST: 19 U/L (ref 15–41)
Albumin: 3.9 g/dL (ref 3.5–5.0)
Alkaline Phosphatase: 50 U/L (ref 38–126)
Anion gap: 10 (ref 5–15)
BUN: 25 mg/dL — ABNORMAL HIGH (ref 8–23)
CO2: 29 mmol/L (ref 22–32)
Calcium: 9.3 mg/dL (ref 8.9–10.3)
Chloride: 99 mmol/L (ref 98–111)
Creatinine, Ser: 0.94 mg/dL (ref 0.44–1.00)
GFR, Estimated: >60 mL/min (ref >60)
Glucose, Bld: 191 mg/dL — ABNORMAL HIGH (ref 70–99)
Potassium: 4.5 mmol/L (ref 3.5–5.1)
Sodium: 138 mmol/L (ref 135–145)
Total Bilirubin: 0.8 mg/dL (ref 0.3–1.2)
Total Protein: 7.8 g/dL (ref 6.5–8.1)

## 2020-12-10 ENCOUNTER — Other Ambulatory Visit: Payer: Self-pay

## 2020-12-10 ENCOUNTER — Encounter
Admission: RE | Disposition: A | Discharge status: Home / Self Care | Payer: Self-pay | Source: Ambulatory Visit | Attending: Surgery

## 2020-12-10 ENCOUNTER — Ambulatory Visit
Admission: RE | Admit: 2020-12-10 | Discharge: 2020-12-10 | Disposition: A | Discharge status: Home / Self Care | Payer: Medicare Other | Source: Ambulatory Visit | Attending: Surgery | Admitting: Surgery

## 2020-12-10 ENCOUNTER — Ambulatory Visit: Payer: Medicare Other | Admitting: Anesthesiology

## 2020-12-10 ENCOUNTER — Encounter: Payer: Self-pay | Admitting: Surgery

## 2020-12-10 DIAGNOSIS — Z7982 Long term (current) use of aspirin: Secondary | ICD-10-CM | POA: Insufficient documentation

## 2020-12-10 DIAGNOSIS — Z885 Allergy status to narcotic agent status: Secondary | ICD-10-CM | POA: Diagnosis not present

## 2020-12-10 DIAGNOSIS — Z6841 Body Mass Index (BMI) 40.0 and over, adult: Secondary | ICD-10-CM | POA: Insufficient documentation

## 2020-12-10 DIAGNOSIS — C50212 Malignant neoplasm of upper-inner quadrant of left female breast: Secondary | ICD-10-CM

## 2020-12-10 DIAGNOSIS — E1136 Type 2 diabetes mellitus with diabetic cataract: Secondary | ICD-10-CM | POA: Diagnosis not present

## 2020-12-10 DIAGNOSIS — C50912 Malignant neoplasm of unspecified site of left female breast: Secondary | ICD-10-CM | POA: Diagnosis present

## 2020-12-10 DIAGNOSIS — E669 Obesity, unspecified: Secondary | ICD-10-CM | POA: Insufficient documentation

## 2020-12-10 DIAGNOSIS — Z91040 Latex allergy status: Secondary | ICD-10-CM | POA: Insufficient documentation

## 2020-12-10 DIAGNOSIS — Z803 Family history of malignant neoplasm of breast: Secondary | ICD-10-CM | POA: Insufficient documentation

## 2020-12-10 DIAGNOSIS — Z8249 Family history of ischemic heart disease and other diseases of the circulatory system: Secondary | ICD-10-CM | POA: Insufficient documentation

## 2020-12-10 DIAGNOSIS — Z809 Family history of malignant neoplasm, unspecified: Secondary | ICD-10-CM | POA: Insufficient documentation

## 2020-12-10 DIAGNOSIS — Z7984 Long term (current) use of oral hypoglycemic drugs: Secondary | ICD-10-CM | POA: Diagnosis not present

## 2020-12-10 DIAGNOSIS — Z79899 Other long term (current) drug therapy: Secondary | ICD-10-CM | POA: Diagnosis not present

## 2020-12-10 DIAGNOSIS — Z170 Estrogen receptor positive status [ER+]: Secondary | ICD-10-CM

## 2020-12-10 DIAGNOSIS — D0512 Intraductal carcinoma in situ of left breast: Secondary | ICD-10-CM | POA: Diagnosis not present

## 2020-12-10 DIAGNOSIS — C50919 Malignant neoplasm of unspecified site of unspecified female breast: Secondary | ICD-10-CM

## 2020-12-10 HISTORY — PX: BREAST LUMPECTOMY WITH RADIOFREQUENCY TAG IDENTIFICATION: SHX6884

## 2020-12-10 LAB — GLUCOSE, CAPILLARY
Glucose-Capillary: 143 mg/dL — ABNORMAL HIGH (ref 70–99)
Glucose-Capillary: 127 mg/dL — ABNORMAL HIGH (ref 70–99)

## 2020-12-10 SURGERY — BREAST LUMPECTOMY WITH RADIOFREQUENCY TAG IDENTIFICATION
Anesthesia: General | Laterality: Left

## 2020-12-10 MED ORDER — BUPIVACAINE LIPOSOME 1.3 % IJ SUSP
INTRAMUSCULAR | Status: AC
  Filled 2020-12-10: qty 20

## 2020-12-10 MED ORDER — PROPOFOL 10 MG/ML IV BOLUS
INTRAVENOUS | Status: AC
  Filled 2020-12-10: qty 20

## 2020-12-10 MED ORDER — GABAPENTIN 300 MG PO CAPS
ORAL_CAPSULE | ORAL | Status: AC
  Administered 2020-12-10: 300 mg via ORAL
  Filled 2020-12-10: qty 1

## 2020-12-10 MED ORDER — BUPIVACAINE-EPINEPHRINE (PF) 0.25% -1:200000 IJ SOLN
INTRAMUSCULAR | Status: AC
  Filled 2020-12-10: qty 30

## 2020-12-10 MED ORDER — ONDANSETRON HCL 4 MG/2ML IJ SOLN
INTRAMUSCULAR | Status: DC | PRN
Start: 2020-12-10 — End: 2020-12-10
  Administered 2020-12-10: 4 mg via INTRAVENOUS

## 2020-12-10 MED ORDER — SODIUM CHLORIDE 0.9 % IV SOLN: INTRAVENOUS | Status: DC

## 2020-12-10 MED ORDER — SEVOFLURANE IN SOLN
RESPIRATORY_TRACT | Status: AC
  Filled 2020-12-10: qty 250

## 2020-12-10 MED ORDER — OXYCODONE HCL 5 MG/5ML PO SOLN: 5.0000 mg | Freq: Once | ORAL | Status: AC | PRN

## 2020-12-10 MED ORDER — CHLORHEXIDINE GLUCONATE CLOTH 2 % EX PADS: 6.0000 | MEDICATED_PAD | Freq: Once | CUTANEOUS | Status: DC

## 2020-12-10 MED ORDER — MEPERIDINE HCL 25 MG/ML IJ SOLN: 6.2500 mg | INTRAMUSCULAR | Status: DC | PRN

## 2020-12-10 MED ORDER — CELECOXIB 200 MG PO CAPS: 200.0000 mg | ORAL_CAPSULE | ORAL | Status: AC

## 2020-12-10 MED ORDER — PROPOFOL 10 MG/ML IV BOLUS
INTRAVENOUS | Status: DC | PRN
  Administered 2020-12-10: 200 mg via INTRAVENOUS

## 2020-12-10 MED ORDER — EPHEDRINE 5 MG/ML INJ
INTRAVENOUS | Status: AC
  Filled 2020-12-10: qty 5

## 2020-12-10 MED ORDER — EPHEDRINE SULFATE 50 MG/ML IJ SOLN
INTRAMUSCULAR | Status: DC | PRN
  Administered 2020-12-10 (×2): 5 mg via INTRAVENOUS

## 2020-12-10 MED ORDER — MIDAZOLAM HCL 2 MG/2ML IJ SOLN
INTRAMUSCULAR | Status: DC | PRN
  Administered 2020-12-10: 2 mg via INTRAVENOUS

## 2020-12-10 MED ORDER — ORAL CARE MOUTH RINSE: 15.0000 mL | Freq: Once | OROMUCOSAL | Status: AC

## 2020-12-10 MED ORDER — OXYCODONE HCL 5 MG PO TABS
5.0000 mg | ORAL_TABLET | Freq: Once | ORAL | Status: AC | PRN
Start: 2020-12-10 — End: 2020-12-10

## 2020-12-10 MED ORDER — STERILE WATER FOR IRRIGATION IR SOLN
Status: DC | PRN
  Administered 2020-12-10: 50 mL

## 2020-12-10 MED ORDER — DEXAMETHASONE SODIUM PHOSPHATE 10 MG/ML IJ SOLN
INTRAMUSCULAR | Status: DC | PRN
  Administered 2020-12-10: 5 mg via INTRAVENOUS

## 2020-12-10 MED ORDER — ONDANSETRON HCL 4 MG/2ML IJ SOLN
INTRAMUSCULAR | Status: AC
  Filled 2020-12-10: qty 2

## 2020-12-10 MED ORDER — PROMETHAZINE HCL 25 MG/ML IJ SOLN: 6.2500 mg | INTRAMUSCULAR | Status: DC | PRN

## 2020-12-10 MED ORDER — BUPIVACAINE LIPOSOME 1.3 % IJ SUSP: 20.0000 mL | Freq: Once | INTRAMUSCULAR | Status: DC

## 2020-12-10 MED ORDER — FAMOTIDINE 20 MG PO TABS: 20.0000 mg | ORAL_TABLET | Freq: Once | ORAL | Status: AC

## 2020-12-10 MED ORDER — FENTANYL CITRATE (PF) 100 MCG/2ML IJ SOLN
25.0000 ug | INTRAMUSCULAR | Status: DC | PRN
  Administered 2020-12-10 (×2): 25 ug via INTRAVENOUS

## 2020-12-10 MED ORDER — SUCCINYLCHOLINE CHLORIDE 200 MG/10ML IV SOSY
PREFILLED_SYRINGE | INTRAVENOUS | Status: DC | PRN
  Administered 2020-12-10: 120 mg via INTRAVENOUS

## 2020-12-10 MED ORDER — CHLORHEXIDINE GLUCONATE 0.12 % MT SOLN
OROMUCOSAL | Status: AC
  Administered 2020-12-10: 15 mL via OROMUCOSAL
  Filled 2020-12-10: qty 15

## 2020-12-10 MED ORDER — ACETAMINOPHEN 500 MG PO TABS
ORAL_TABLET | ORAL | Status: AC
  Administered 2020-12-10: 1000 mg via ORAL
  Filled 2020-12-10: qty 2

## 2020-12-10 MED ORDER — IBUPROFEN 800 MG PO TABS: 800.0000 mg | ORAL_TABLET | Freq: Three times a day (TID) | ORAL | 0 refills | Status: DC | PRN

## 2020-12-10 MED ORDER — CEFAZOLIN SODIUM-DEXTROSE 2-4 GM/100ML-% IV SOLN
INTRAVENOUS | Status: AC
  Filled 2020-12-10: qty 100

## 2020-12-10 MED ORDER — ACETAMINOPHEN 500 MG PO TABS: 1000.0000 mg | ORAL_TABLET | ORAL | Status: AC

## 2020-12-10 MED ORDER — MIDAZOLAM HCL 2 MG/2ML IJ SOLN
INTRAMUSCULAR | Status: AC
  Filled 2020-12-10: qty 2

## 2020-12-10 MED ORDER — FENTANYL CITRATE (PF) 100 MCG/2ML IJ SOLN
INTRAMUSCULAR | Status: AC
  Administered 2020-12-10: 25 ug via INTRAVENOUS
  Filled 2020-12-10: qty 2

## 2020-12-10 MED ORDER — LIDOCAINE HCL (PF) 2 % IJ SOLN
INTRAMUSCULAR | Status: AC
  Filled 2020-12-10: qty 5

## 2020-12-10 MED ORDER — CHLORHEXIDINE GLUCONATE 0.12 % MT SOLN: 15.0000 mL | Freq: Once | OROMUCOSAL | Status: AC

## 2020-12-10 MED ORDER — CELECOXIB 200 MG PO CAPS
ORAL_CAPSULE | ORAL | Status: AC
  Administered 2020-12-10: 200 mg via ORAL
  Filled 2020-12-10: qty 1

## 2020-12-10 MED ORDER — CEFAZOLIN IN SODIUM CHLORIDE 3-0.9 GM/100ML-% IV SOLN
3.0000 g | INTRAVENOUS | Status: DC
  Filled 2020-12-10: qty 100

## 2020-12-10 MED ORDER — FAMOTIDINE 20 MG PO TABS
ORAL_TABLET | ORAL | Status: AC
  Administered 2020-12-10: 20 mg via ORAL
  Filled 2020-12-10: qty 1

## 2020-12-10 MED ORDER — FENTANYL CITRATE (PF) 100 MCG/2ML IJ SOLN
INTRAMUSCULAR | Status: AC
  Filled 2020-12-10: qty 2

## 2020-12-10 MED ORDER — PHENYLEPHRINE HCL (PRESSORS) 10 MG/ML IV SOLN
INTRAVENOUS | Status: DC | PRN
  Administered 2020-12-10 (×4): 50 ug via INTRAVENOUS

## 2020-12-10 MED ORDER — GABAPENTIN 300 MG PO CAPS: 300.0000 mg | ORAL_CAPSULE | ORAL | Status: AC

## 2020-12-10 MED ORDER — LIDOCAINE HCL (CARDIAC) PF 100 MG/5ML IV SOSY
PREFILLED_SYRINGE | INTRAVENOUS | Status: DC | PRN
  Administered 2020-12-10: 100 mg via INTRAVENOUS

## 2020-12-10 MED ORDER — DEXTROSE 5 % IV SOLN
INTRAVENOUS | Status: DC | PRN
  Administered 2020-12-10: 3 g via INTRAVENOUS

## 2020-12-10 MED ORDER — FENTANYL CITRATE (PF) 100 MCG/2ML IJ SOLN
INTRAMUSCULAR | Status: DC | PRN
  Administered 2020-12-10: 50 ug via INTRAVENOUS

## 2020-12-10 MED ORDER — OXYCODONE HCL 5 MG PO TABS
ORAL_TABLET | ORAL | Status: AC
  Administered 2020-12-10: 5 mg via ORAL
  Filled 2020-12-10: qty 1

## 2020-12-10 SURGICAL SUPPLY — 39 items
ADH SKN CLS APL DERMABOND .7 (GAUZE/BANDAGES/DRESSINGS) ×1
APL PRP STRL LF DISP 70% ISPRP (MISCELLANEOUS) ×1
APPLIER CLIP 9.375 SM OPEN (CLIP)
APR CLP SM 9.3 20 MLT OPN (CLIP)
BLADE SURG 15 STRL LF DISP TIS (BLADE) ×1 IMPLANT
BLADE SURG 15 STRL SS (BLADE) ×2
CHLORAPREP W/TINT 26 (MISCELLANEOUS) ×2 IMPLANT
CLIP APPLIE 9.375 SM OPEN (CLIP) IMPLANT
CNTNR SPEC 2.5X3XGRAD LEK (MISCELLANEOUS)
CONT SPEC 4OZ STER OR WHT (MISCELLANEOUS)
CONT SPEC 4OZ STRL OR WHT (MISCELLANEOUS)
CONTAINER SPEC 2.5X3XGRAD LEK (MISCELLANEOUS) IMPLANT
DECANTER SPIKE VIAL GLASS SM (MISCELLANEOUS) ×2 IMPLANT
DERMABOND ADVANCED (GAUZE/BANDAGES/DRESSINGS) ×1
DERMABOND ADVANCED .7 DNX12 (GAUZE/BANDAGES/DRESSINGS) ×1 IMPLANT
DEVICE DUBIN SPECIMEN MAMMOGRA (MISCELLANEOUS) IMPLANT
DRAPE LAPAROTOMY TRNSV 106X77 (MISCELLANEOUS) ×2 IMPLANT
ELECT CAUTERY BLADE TIP 2.5 (TIP) ×2
ELECT REM PT RETURN 9FT ADLT (ELECTROSURGICAL) ×2
ELECTRODE CAUTERY BLDE TIP 2.5 (TIP) ×1 IMPLANT
ELECTRODE REM PT RTRN 9FT ADLT (ELECTROSURGICAL) ×1 IMPLANT
GAUZE 4X4 16PLY ~~LOC~~+RFID DBL (SPONGE) ×2 IMPLANT
GLOVE SURG ORTHO LTX SZ7.5 (GLOVE) ×10 IMPLANT
GOWN STRL REUS W/ TWL LRG LVL3 (GOWN DISPOSABLE) ×3 IMPLANT
GOWN STRL REUS W/TWL LRG LVL3 (GOWN DISPOSABLE) ×6
KIT MARKER MARGIN INK (KITS) IMPLANT
KIT TURNOVER KIT A (KITS) ×2 IMPLANT
MANIFOLD NEPTUNE II (INSTRUMENTS) ×2 IMPLANT
MARKER MARGIN CORRECT CLIP (MARKER) IMPLANT
NEEDLE HYPO 22GX1.5 SAFETY (NEEDLE) ×2 IMPLANT
PACK BASIN MINOR ARMC (MISCELLANEOUS) ×2 IMPLANT
SUT MNCRL 4-0 (SUTURE) ×2
SUT MNCRL 4-0 27XMFL (SUTURE) ×1
SUT VIC AB 3-0 SH 27 (SUTURE) ×2
SUT VIC AB 3-0 SH 27X BRD (SUTURE) ×1 IMPLANT
SUTURE MNCRL 4-0 27XMF (SUTURE) ×1 IMPLANT
SYR 10ML LL (SYRINGE) ×2 IMPLANT
WATER STERILE IRR 1000ML POUR (IV SOLUTION) ×2 IMPLANT
WATER STERILE IRR 500ML POUR (IV SOLUTION) ×2 IMPLANT

## 2020-12-10 NOTE — Interval H&P Note (Signed)
History and Physical Interval Note:  12/10/2020 12:45 PM  Sharon Ware  has presented today for surgery, with the diagnosis of left breast cancer.  The various methods of treatment have been discussed with the patient and family. After consideration of risks, benefits and other options for treatment, the patient has consented to  Procedure(s): BREAST LUMPECTOMY WITH RADIOFREQUENCY TAG IDENTIFICATION (Left) as a surgical intervention.  The patient's history has been reviewed, patient examined, no change in status, stable for surgery.  I have reviewed the patient's chart and labs.  Questions were answered to the patient's satisfaction.   The left breast is marked.   Ronny Bacon

## 2020-12-10 NOTE — Discharge Instructions (Signed)
AMBULATORY SURGERY  ?DISCHARGE INSTRUCTIONS ? ? ?The drugs that you were given will stay in your system until tomorrow so for the next 24 hours you should not: ? ?Drive an automobile ?Make any legal decisions ?Drink any alcoholic beverage ? ? ?You may resume regular meals tomorrow.  Today it is better to start with liquids and gradually work up to solid foods. ? ?You may eat anything you prefer, but it is better to start with liquids, then soup and crackers, and gradually work up to solid foods. ? ? ?Please notify your doctor immediately if you have any unusual bleeding, trouble breathing, redness and pain at the surgery site, drainage, fever, or pain not relieved by medication. ? ? ? ?Additional Instructions: ? ? ? ?Please contact your physician with any problems or Same Day Surgery at [phone], Monday through Friday 6 am to 4 pm, or [place] at [facility] number at [phone].  ?

## 2020-12-10 NOTE — Op Note (Signed)
  Pre-operative Diagnosis: Breast Cancer, left    Post-operative Diagnosis: Same  Surgeon: Ronny Bacon, M.D., [facility]  Anesthesia: General endotracheal  Procedure: Left upper breast lumpectomy, RFID tag directed.  Procedure Details  The patient was seen again in the Holding Room. The benefits, complications, treatment options, and expected outcomes were discussed with the patient. The risks of bleeding, infection, recurrence of symptoms, failure to resolve symptoms, hematoma, seroma, open wound, cosmetic deformity, and the need for further surgery were discussed.  The patient was taken to Operating Room, identified as Sharon Ware and the procedure verified.  A Time Out was held and the above information confirmed.  Prior to the induction of general anesthesia, antibiotic prophylaxis was administered. VTE prophylaxis was in place. The patient was positioned in the supine position. Appropriate anesthesia was then administered and tolerated well. The LOCALizer is used to mark the skin for incision.  The chest was prepped with Chloraprep and draped in the sterile fashion.   Attention was turned to the RFID tag localization site where an incision was made. Dissection using the LOCALizer to perform a lumpectomy with adequate margins was performed. This was done with electrocautery and sharp dissection with Mayo scissors. There was minimal bleeding, and the cavity packed.  The specimen was taken to the back table and painted to demarcate the 6 surfaces of potential margin.   I returned to the cavity to remove the packing, and hemostasis was confirmed with electrocautery.   Once assuring that hemostasis was adequate and checked multiple times the wound was closed with interrupted 3-0 Vicryl followed by 4-0 subcuticular Monocryl sutures.  Dermabond is utilized to seal the incision.  A depot of quarter percent Marcaine with epinephrine was infiltrated into the biopsy cavity and adjacent soft  tissues.   Findings: Faxitron imaging: Both markers centrally within the specimen.  Estimated Blood Loss: Minimal         Drains: None         Specimens: Upper left breast tissue.       Complications: None         Condition: Stable   Ronny Bacon, M.D., [facility]  12/10/2020 ; 2:30 PM

## 2020-12-10 NOTE — Anesthesia Procedure Notes (Signed)
Procedure Name: Intubation Date/Time: 12/10/2020 1:29 PM Performed by: Jerrye Noble, CRNA Pre-anesthesia Checklist: Patient identified, Emergency Drugs available, Suction available and Patient being monitored Patient Re-evaluated:Patient Re-evaluated prior to induction Oxygen Delivery Method: Circle system utilized Preoxygenation: Pre-oxygenation with 100% oxygen Induction Type: IV induction Ventilation: Mask ventilation without difficulty and Oral airway inserted - appropriate to patient size Laryngoscope Size: McGraph and 4 Grade View: Grade I Tube type: Oral Tube size: 7.0 mm Number of attempts: 1 Airway Equipment and Method: Stylet, Oral airway and Video-laryngoscopy Placement Confirmation: ETT inserted through vocal cords under direct vision, positive ETCO2 and breath sounds checked- equal and bilateral Secured at: 22 cm Tube secured with: Tape Dental Injury: Teeth and Oropharynx as per pre-operative assessment

## 2020-12-10 NOTE — Anesthesia Preprocedure Evaluation (Signed)
Anesthesia Evaluation  Patient identified by MRN, date of birth, ID band Patient awake    Reviewed: Allergy & Precautions, NPO status , Patient's Chart, lab work & pertinent test results  History of Anesthesia Complications Negative for: history of anesthetic complications  Airway Mallampati: III  TM Distance: >3 FB Neck ROM: Full    Dental  (+) Poor Dentition   Pulmonary neg pulmonary ROS, neg sleep apnea, neg COPD,    breath sounds clear to auscultation- rhonchi (-) wheezing      Cardiovascular hypertension, Pt. on medications (-) CAD, (-) Past MI, (-) Cardiac Stents and (-) CABG  Rhythm:Regular Rate:Normal - Systolic murmurs and - Diastolic murmurs    Neuro/Psych neg Seizures negative neurological ROS  negative psych ROS   GI/Hepatic negative GI ROS, Neg liver ROS,   Endo/Other  diabetes, Oral Hypoglycemic Agents  Renal/GU negative Renal ROS     Musculoskeletal  (+) Arthritis ,   Abdominal (+) + obese,   Peds  Hematology negative hematology ROS (+)   Anesthesia Other Findings Past Medical History: No date: Arthritis No date: Breast cancer (HCC) No date: Cataract No date: Diabetes mellitus without complication (HCC) No date: Hyperlipidemia No date: Hypertension No date: Vitamin D deficiency   Reproductive/Obstetrics                             Anesthesia Physical Anesthesia Plan  ASA: 3  Anesthesia Plan: General   Post-op Pain Management:    Induction: Intravenous  PONV Risk Score and Plan: 2  Airway Management Planned: Oral ETT  Additional Equipment:   Intra-op Plan:   Post-operative Plan: Extubation in OR  Informed Consent: I have reviewed the patients History and Physical, chart, labs and discussed the procedure including the risks, benefits and alternatives for the proposed anesthesia with the patient or authorized representative who has indicated his/her  understanding and acceptance.     Dental advisory given  Plan Discussed with: CRNA and Anesthesiologist  Anesthesia Plan Comments:         Anesthesia Quick Evaluation

## 2020-12-10 NOTE — Transfer of Care (Signed)
Immediate Anesthesia Transfer of Care Note  Patient: Sharon Ware  Procedure(s) Performed: BREAST LUMPECTOMY WITH RADIOFREQUENCY TAG IDENTIFICATION (Left)  Patient Location: PACU  Anesthesia Type:General  Level of Consciousness: drowsy and patient cooperative  Airway & Oxygen Therapy: Patient Spontanous Breathing and Patient connected to face mask oxygen  Post-op Assessment: Report given to RN and Post -op Vital signs reviewed and stable  Post vital signs: Reviewed and stable  Last Vitals:  Vitals Value Taken Time  BP 130/72 12/10/20 1431  Temp 36.4 C 12/10/20 1430  Pulse 79 12/10/20 1436  Resp 20 12/10/20 1435  SpO2 100 % 12/10/20 1436  Vitals shown include unvalidated device data.  Last Pain:  Vitals:   12/10/20 1228  TempSrc: Temporal  PainSc: 0-No pain         Complications: No notable events documented.

## 2020-12-11 ENCOUNTER — Encounter: Payer: Self-pay | Admitting: Surgery

## 2020-12-11 NOTE — Anesthesia Postprocedure Evaluation (Signed)
Anesthesia Post Note  Patient: Sharon Ware  Procedure(s) Performed: BREAST LUMPECTOMY WITH RADIOFREQUENCY TAG IDENTIFICATION (Left)  Anesthesia Type: General Anesthetic complications: no   No notable events documented.   Last Vitals:  Vitals:   12/10/20 1515 12/10/20 1533  BP: 125/71 (!) 141/67  Pulse: 71 73  Resp: 17 16  Temp:    SpO2: 94% 93%    Last Pain:  Vitals:   12/10/20 1533  TempSrc:   PainSc: 3                  Phill Mutter

## 2020-12-12 ENCOUNTER — Other Ambulatory Visit: Payer: Self-pay | Admitting: Pathology

## 2020-12-12 LAB — SURGICAL PATHOLOGY

## 2020-12-18 ENCOUNTER — Other Ambulatory Visit: Payer: Self-pay

## 2020-12-18 ENCOUNTER — Encounter: Payer: Self-pay | Admitting: Surgery

## 2020-12-18 ENCOUNTER — Ambulatory Visit (INDEPENDENT_AMBULATORY_CARE_PROVIDER_SITE_OTHER): Payer: Medicare Other | Admitting: Surgery

## 2020-12-18 VITALS — BP 138/83 | HR 90 | Temp 98.3°F

## 2020-12-18 DIAGNOSIS — C50212 Malignant neoplasm of upper-inner quadrant of left female breast: Secondary | ICD-10-CM

## 2020-12-18 DIAGNOSIS — Z170 Estrogen receptor positive status [ER+]: Secondary | ICD-10-CM

## 2020-12-18 NOTE — Progress Notes (Signed)
[facility] POST-OP OFFICE VISIT  12/18/2020  HPI: Sharon Ware is a 70 y.o. female  8 days s/p RF ID tag left breast lumpectomy.   She denies any breast pain, drainage or redness. Vital signs: BP 138/83   Pulse 90   Temp 98.3 F (36.8 C)   SpO2 94%    Physical Exam: Constitutional: She appears well. Left breast without evidence of hematoma or mass-effect at lumpectomy site. Skin: Incision is clean, dry and intact.  SURGICAL PATHOLOGY  CASE: [id number]  PATIENT: Sharon Ware  Surgical Pathology Report   Specimen Submitted:  A. Breast, left   Clinical History: Left breast cancer.   DIAGNOSIS:  A. BREAST, LEFT; LUMPECTOMY:  - TUBULAR CARCINOMA.  - DUCTAL CARCINOMA IN SITU (DCIS).  - SEE CANCER SUMMARY BELOW.  - BIOPSY SITE WITH HEART-SHAPED CLIP.  - RF ID TAG IN PLACE.   CANCER CASE SUMMARY: INVASIVE CARCINOMA OF THE BREAST  Standard(s): AJCC-UICC 8   SPECIMEN  Procedure: Lumpectomy  Specimen Laterality: Left   TUMOR  Histologic Type: Tubular carcinoma  Histologic Grade (Nottingham Histologic Score)                       Glandular (Acinar)/Tubular Differentiation: 1                       Nuclear Pleomorphism: 2                       Mitotic Rate: 1                       Overall Grade: 1  Tumor Size: 10 mm  Ductal Carcinoma In Situ (DCIS): Present, intermediate grade  Lymphovascular Invasion: Not identified  Treatment Effect in the Breast: No known presurgical therapy   MARGINS  Margin Status for Invasive Carcinoma: Margin involved by Invasive  Carcinoma                       Superior, multifocal   Margin Status for DCIS: All margins negative for DCIS                       Distance from DCIS to closest margin: Less than 1  mm                       Specify closest margin: Superior    REGIONAL LYMPH NODES  Regional Lymph Node Status: Not applicable (no regional lymph nodes  submitted or found)   DISTANT METASTASIS  Distant  Site(s) Involved, if applicable: Not applicable   PATHOLOGIC STAGE CLASSIFICATION (pTNM, AJCC 8th Edition):  TNM Descriptors: Not applicable  [id number]  Regional Lymph Nodes Modifier: Not applicable  pN - Not assigned (no nodes submitted or found)  pM - Not applicable   SPECIAL STUDIES  Breast Biomarker Testing Performed on Previous Biopsy: [id number]  Estrogen Receptor (ER) Status: Positive  Progesterone Receptor (PgR) Status: Positive  HER2 (by immunohistochemistry): Negative    GROSS DESCRIPTION:  A. Labeled: Left breast lumpectomy  Received: Fresh  Specimen radiograph image(s) available for review  Radiographic findings: A heart-shaped clip and an RF ID tag are present.  Time in fixative: Collected at 2:05 PM on 12/10/2020 and placed in  formalin at 2:16 PM on 12/10/2020  Cold ischemic time: Less than 15 minutes  Total fixation time: Approximately 27 hours  Type of procedure: Breast lumpectomy  Location / laterality of specimen: Left breast  Orientation of specimen: The specimen is received inked.  Inking:  Anterior = green  Inferior = blue  Lateral = orange  Medial = yellow  Posterior = black  Superior = red  Size of specimen: 9.4 (superior to inferior) x 5.4 (medial to lateral) x  2.9 (anterior to posterior) cm  Skin: None grossly identified.   Biopsy site: There is a hemorrhagic biopsy site which contains a  heart-shaped clip.  Number of discrete masses: 1  Size of mass(es): 1.4 x 0.9 x 0.7 cm  Description of mass(es): The mass is tan-white, firm, and ill-defined  with surrounding slightly softer fibrous tissue, 5.2 x 4.9 x 1.6 cm.  Distance between masses/clips: The clip site is located at the inferior  periphery of the mass.  Margins: Posterior = 0.7 cm, superior = 0.5 cm, lateral = 1.6 cm, medial  = 2 cm, anterior = 1 cm, inferior = 3.7 cm    Assessment/Plan: This is a 70 y.o. female 8 days s/p RF ID tag left breast lumpectomy, Currently with low-grade tubular  invasive carcinoma, ER/PR positive, HER2 negative.  Multifocal positive superior margin noted.   Would likely warrant reexcision, will defer additional discussion to Dr. Baruch Gouty and breast cancer conference. Negative margins for DCIS.  Currently weighing morbidity risk with reexcision versus proceeding with radiation. We discussed that anticipating estrogen blockade. Patient Active Problem List   Diagnosis Date Noted   Malignant neoplasm of upper-inner quadrant of left female breast ([place]) 12/01/2020   Goals of care, counseling/discussion 12/01/2020   Morbid obesity ([place]) 08/08/2018   BMI 60.0-69.9, adult ([place]) 08/08/2018   Diabetes mellitus without complication ([place]) 31/54/0086   Hyperlipidemia 08/08/2018   Hypertension associated with diabetes ([place]) 08/08/2018   Arthritis of both knees 08/08/2018   Vitamin D deficiency 08/08/2018    -We will touch base with Dr. Baruch Gouty of radiation oncology and perhaps wait for discussion of breast cancer conference prior to scheduling reexcision.  In the interim allowing time for biopsy cavity to diminish. Extensive discussion with patient and husband regarding how we arrive in negotiating our next steps of treatment.   Ronny Bacon M.D., FACS 12/18/2020, 10:44 AM

## 2020-12-18 NOTE — Patient Instructions (Addendum)
We will speak with pathology/radiology to determine what the next steps may be for you treatment.

## 2020-12-19 ENCOUNTER — Telehealth: Payer: Self-pay | Admitting: Family Medicine

## 2020-12-19 ENCOUNTER — Ambulatory Visit (INDEPENDENT_AMBULATORY_CARE_PROVIDER_SITE_OTHER): Payer: Medicare Other | Admitting: Family Medicine

## 2020-12-19 DIAGNOSIS — Z23 Encounter for immunization: Secondary | ICD-10-CM | POA: Diagnosis not present

## 2020-12-19 NOTE — Telephone Encounter (Signed)
Patient's husband, Antonie Stjacques, dropped off renewal of disablility parking placard paperwork to be filled out by provider.  Husband, Niomi Towsend, has asked to be called when paper is ready for pickup at [phone].  Placed paperwork in provider's folder.

## 2020-12-22 ENCOUNTER — Encounter: Payer: Self-pay | Admitting: Oncology

## 2020-12-22 ENCOUNTER — Encounter: Payer: Self-pay | Admitting: Radiation Oncology

## 2020-12-22 ENCOUNTER — Inpatient Hospital Stay: Payer: Medicare Other | Attending: Oncology | Admitting: Oncology

## 2020-12-22 ENCOUNTER — Ambulatory Visit
Admission: RE | Admit: 2020-12-22 | Discharge: 2020-12-22 | Disposition: A | Discharge status: Home / Self Care | Payer: Medicare Other | Source: Ambulatory Visit | Attending: Radiation Oncology | Admitting: Radiation Oncology

## 2020-12-22 VITALS — BP 134/78 | HR 84 | Temp 97.9°F

## 2020-12-22 VITALS — BP 141/80 | HR 82 | Temp 98.6°F | Resp 22 | Wt 363.3 lb

## 2020-12-22 DIAGNOSIS — M129 Arthropathy, unspecified: Secondary | ICD-10-CM | POA: Diagnosis not present

## 2020-12-22 DIAGNOSIS — Z79899 Other long term (current) drug therapy: Secondary | ICD-10-CM | POA: Diagnosis not present

## 2020-12-22 DIAGNOSIS — E559 Vitamin D deficiency, unspecified: Secondary | ICD-10-CM | POA: Diagnosis not present

## 2020-12-22 DIAGNOSIS — Z7982 Long term (current) use of aspirin: Secondary | ICD-10-CM | POA: Diagnosis not present

## 2020-12-22 DIAGNOSIS — Z7984 Long term (current) use of oral hypoglycemic drugs: Secondary | ICD-10-CM | POA: Diagnosis not present

## 2020-12-22 DIAGNOSIS — Z803 Family history of malignant neoplasm of breast: Secondary | ICD-10-CM | POA: Insufficient documentation

## 2020-12-22 DIAGNOSIS — E785 Hyperlipidemia, unspecified: Secondary | ICD-10-CM | POA: Diagnosis not present

## 2020-12-22 DIAGNOSIS — Z809 Family history of malignant neoplasm, unspecified: Secondary | ICD-10-CM | POA: Insufficient documentation

## 2020-12-22 DIAGNOSIS — I10 Essential (primary) hypertension: Secondary | ICD-10-CM | POA: Diagnosis not present

## 2020-12-22 DIAGNOSIS — C50212 Malignant neoplasm of upper-inner quadrant of left female breast: Secondary | ICD-10-CM | POA: Diagnosis present

## 2020-12-22 DIAGNOSIS — Z170 Estrogen receptor positive status [ER+]: Secondary | ICD-10-CM | POA: Insufficient documentation

## 2020-12-22 DIAGNOSIS — Z791 Long term (current) use of non-steroidal anti-inflammatories (NSAID): Secondary | ICD-10-CM | POA: Diagnosis not present

## 2020-12-22 DIAGNOSIS — Z780 Asymptomatic menopausal state: Secondary | ICD-10-CM

## 2020-12-22 DIAGNOSIS — E119 Type 2 diabetes mellitus without complications: Secondary | ICD-10-CM | POA: Insufficient documentation

## 2020-12-22 NOTE — Progress Notes (Signed)
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  Ultrasound showed a 1.2 x 0.8 x 0.7 cm mass 8 cm from the nipple at the 10:30 position.  No suspicious left axillary adenopathy.  This was biopsied and was consistent with invasive mammary carcinoma with tubular features grade 1.  No lymphovascular invasion identified.  ER/PR greater than 90% positive HER2 negative  Final pathology from lumpectomy showed tubular carcinoma grade 10 mm.  Multifocal superior margin positive.  Interval history- patient is healing well from her lumpectomy.  She was also seen by Dr. Vance Gather PS- 2 Pain scale- 0   Review of systems- Review of Systems  Constitutional:  Negative for chills, fever, malaise/fatigue and weight loss.  HENT:  Negative for congestion, ear discharge and nosebleeds.   Eyes:  Negative for blurred vision.  Respiratory:  Negative for cough, hemoptysis, sputum production, shortness of breath and wheezing.   Cardiovascular:  Negative for chest pain, palpitations, orthopnea and claudication.  Gastrointestinal:  Negative for abdominal pain, blood in stool, constipation, diarrhea, heartburn, melena, nausea and vomiting.  Genitourinary:  Negative for  dysuria, flank pain, frequency, hematuria and urgency.  Musculoskeletal:  Negative for back pain, joint pain and myalgias.  Skin:  Negative for rash.  Neurological:  Negative for dizziness, tingling, focal weakness, seizures, weakness and headaches.  Endo/Heme/Allergies:  Does not bruise/bleed easily.  Psychiatric/Behavioral:  Negative for depression and suicidal ideas. The patient does not have insomnia.       Allergies  Allergen Reactions   Other Anaphylaxis    Honey Dew Melon   Wound Dressing Adhesive Rash   Morphine And Related Other (See Comments)    Hypotension   Latex Rash     Past Medical History:  Diagnosis Date   Arthritis    Breast cancer ([place])    Cataract    Diabetes mellitus without complication ([place])    Hyperlipidemia    Hypertension    Vitamin D deficiency      Past Surgical History:  Procedure Laterality Date   ABDOMINAL HYSTERECTOMY     x2   BREAST BIOPSY Right 1990s   neg   BREAST BIOPSY Right 2000s   neg   BREAST BIOPSY Left 11/24/2020   u/s bx 10:30/8 cmfn-"heart" marker   BREAST EXCISIONAL BIOPSY Right    neg   BREAST LUMPECTOMY WITH RADIOFREQUENCY TAG IDENTIFICATION Left 09/05/6946   Procedure: BREAST LUMPECTOMY WITH RADIOFREQUENCY TAG IDENTIFICATION;  Surgeon: Ronny Bacon, MD;  Location: [facility] ORS;  Service: General;  Laterality: Left;   DILATION AND CURETTAGE OF UTERUS     EYE SURGERY  2004   cataract surgery and laser surgery   Salpingo-oophorectomy      Social History   Socioeconomic History   Marital status: Married    Spouse name: Not on file   Number of  children: Not on file   Years of education: Not on file   Highest education level: Not on file  Occupational History   Occupation: retired  Tobacco Use   Smoking status: Never   Smokeless tobacco: Never  Vaping Use   Vaping Use: Never used  Substance and Sexual Activity   Alcohol use: Not Currently   Drug use: Never   Sexual activity: Not Currently  Other Topics  Concern   Not on file  Social History Narrative   Not on file   Social Determinants of Health   Financial Resource Strain: Low Risk    Difficulty of Paying Living Expenses: Not hard at all  Food Insecurity: No Food Insecurity   Worried About Charity fundraiser in the Last Year: Never true   [place] in the Last Year: Never true  Transportation Needs: No Transportation Needs   Lack of Transportation (Medical): No   Lack of Transportation (Non-Medical): No  Physical Activity: Inactive   Days of Exercise per Week: 0 days   Minutes of Exercise per Session: 0 min  Stress: No Stress Concern Present   Feeling of Stress : Not at all  Social Connections: Not on file  Intimate Partner Violence: Not on file    Family History  Problem Relation Age of Onset   Breast cancer Mother 51   Heart disease Father    Cancer Paternal Grandfather    Heart disease Paternal Grandfather      Current Outpatient Medications:    acetaminophen (TYLENOL) 500 MG tablet, Take 500 mg by mouth in the morning and at bedtime., Disp: , Rfl:    aspirin 81 MG tablet, Take 81 mg by mouth daily., Disp: , Rfl:    Cholecalciferol (VITAMIN D3) 125 MCG (5000 UT) TABS, Take 5,000 Units by mouth daily., Disp: , Rfl:    Glucosamine HCl 1500 MG TABS, Take 1,500 mg by mouth in the morning and at bedtime., Disp: , Rfl:    losartan (COZAAR) 50 MG tablet, Take 1 tablet (50 mg total) by mouth daily., Disp: 90 tablet, Rfl: 1   lovastatin (MEVACOR) 10 MG tablet, Take 1 tablet (10 mg total) by mouth daily., Disp: 90 tablet, Rfl: 1   Menthol, Topical Analgesic, (ICY HOT EX), Apply 1 application topically daily as needed (pain)., Disp: , Rfl:    metFORMIN (GLUCOPHAGE-XR) 500 MG 24 hr tablet, Take 1 tablet (500 mg total) by mouth 2 (two) times daily., Disp: 180 tablet, Rfl: 1   Omega-3 Fatty Acids (FISH OIL ULTRA) 1400 MG CAPS, Take 1,400 mg by mouth daily., Disp: , Rfl:    spironolactone (ALDACTONE) 50 MG tablet, Take 1  tablet (50 mg total) by mouth daily., Disp: 90 tablet, Rfl: 1   ibuprofen (ADVIL) 800 MG tablet, Take 1 tablet (800 mg total) by mouth every 8 (eight) hours as needed. (Patient not taking: Reported on 12/22/2020), Disp: 30 tablet, Rfl: 0  Physical exam:  Vitals:   12/22/20 1059  BP: (!) 141/80  Pulse: 82  Resp: (!) 22  Temp: 98.6 F (37 C)  SpO2: 95%  Weight: (!) 363 lb 4.8 oz (164.8 kg)   Physical Exam Constitutional:      Appearance: She is obese.     Comments: She is sitting in a wheelchair.  Appears in no acute distress  Cardiovascular:     Rate and Rhythm: Normal rate and regular rhythm.  Pulmonary:     Effort: Pulmonary effort is normal.  Skin:  General: Skin is warm and dry.  Neurological:     Mental Status: She is alert and oriented to person, place, and time.    Breast exam: Patient is s/p left lumpectomy. Surgical scar is healing well   CMP Latest Ref Rng & Units 12/09/2020  Glucose 70 - 99 mg/dL 191(H)  BUN 8 - 23 mg/dL 25(H)  Creatinine 0.44 - 1.00 mg/dL 0.94  Sodium 135 - 145 mmol/L 138  Potassium 3.5 - 5.1 mmol/L 4.5  Chloride 98 - 111 mmol/L 99  CO2 22 - 32 mmol/L 29  Calcium 8.9 - 10.3 mg/dL 9.3  Total Protein 6.5 - 8.1 g/dL 7.8  Total Bilirubin 0.3 - 1.2 mg/dL 0.8  Alkaline Phos 38 - 126 U/L 50  AST 15 - 41 U/L 19  ALT 0 - 44 U/L 22   CBC Latest Ref Rng & Units 12/09/2020  WBC 4.0 - 10.5 K/uL 8.6  Hemoglobin 12.0 - 15.0 g/dL 16.3(H)  Hematocrit 36.0 - 46.0 % 48.6(H)  Platelets 150 - 400 K/uL 221    No images are attached to the encounter.  MM Breast Surgical Specimen  Result Date: 12/10/2020 CLINICAL DATA:  Status post radiofrequency tag localized LEFT breast lumpectomy. EXAM: SPECIMEN RADIOGRAPH OF THE LEFT BREAST COMPARISON:  Previous exam(s). FINDINGS: Status post excision of the LEFT breast. The radiofrequency tag and heart shaped clip are present within the specimen. IMPRESSION: Specimen radiograph of the LEFT breast. Electronically Signed    By: Valentino Saxon M.D.   On: 12/10/2020 14:12  MM DIAG BREAST TOMO UNI LEFT  Result Date: 12/09/2020 CLINICAL DATA:  Postprocedure mammogram EXAM: DIAGNOSTIC LEFT MAMMOGRAM POST ULTRASOUND-GUIDED RADIOFREQUENCY TAG PLACEMENT COMPARISON:  Previous exam(s). FINDINGS: Mammographic images were obtained following ultrasound-guided radiofrequency tag placement. These demonstrate the RF tag in appropriate location, approximately 5 mm anterolateral to the heart shaped biopsy clip, in the upper inner left breast at middle depth. IMPRESSION: Appropriate location of the radiofrequency tag. Final Assessment: Post Procedure Mammograms for RF tag placement Electronically Signed   By: Ileana Roup M.D.   On: 12/09/2020 13:46  MM CLIP PLACEMENT LEFT  Result Date: 11/24/2020 CLINICAL DATA:  Status post ultrasound-guided core biopsy of a mass in the upper-inner quadrant of the left breast. EXAM: 3D DIAGNOSTIC LEFT MAMMOGRAM POST ULTRASOUND BIOPSY COMPARISON:  Previous exam(s). FINDINGS: 3D Mammographic images were obtained following ultrasound guided biopsy of a mass in the upper-inner quadrant of the left breast. The biopsy marking clip is in expected location in the upper inner quadrant of the left breast. IMPRESSION: Appropriate positioning of the heart shaped biopsy marking clip at the site of biopsy in the upper inner quadrant of the left breast. Final Assessment: Post Procedure Mammograms for Marker Placement Electronically Signed   By: Lillia Mountain M.D.   On: 11/24/2020 09:57  [place] LT BREAST BX W LOC DEV 1ST LESION IMG BX SPEC US GUIDE  Addendum Date: 11/28/2020   ADDENDUM REPORT: 11/26/2020 13:52 ADDENDUM: PATHOLOGY revealed: A. BREAST, LEFT, 10:30; ULTRASOUND-GUIDED CORE BIOPSY:- INVASIVE MAMMARY CARCINOMA WITH FEATURES OF TUBULAR CARCINOMA, SEE COMMENT. Size of invasive carcinoma: 4 mm in this sample. Grade 1. Ductal carcinoma in situ (DCIS): Present, low-grade. Lymphovascular invasion: Not identified. Comment:  This may be a pure tubular carcinoma. Pathology results are CONCORDANT with imaging findings, per Dr. Lillia Mountain. Pathology results and recommendations were discussed with patient via telephone on 11/26/2020. Patient reported doing well after the biopsy with no adverse symptoms, and only slight tenderness at the site. Post  biopsy care instructions were reviewed, questions were answered and my direct phone number was provided. Patient was instructed to call [facility] for any additional questions or concerns related to biopsy site. Recommend surgical consultation: Request for surgical consultation relayed to Al Pimple RN and Tanya Nones RN at [facility] by Electa Sniff RN on 11/26/2020. Pathology results reported by Electa Sniff RN on 11/26/2020. Electronically Signed   By: Lillia Mountain M.D.   On: 11/26/2020 13:52   Result Date: 11/28/2020 CLINICAL DATA:  Suspicious left breast mass. EXAM: ULTRASOUND GUIDED LEFT BREAST CORE NEEDLE BIOPSY COMPARISON:  Previous exam(s). PROCEDURE: I met with the patient and we discussed the procedure of ultrasound-guided biopsy, including benefits and alternatives. We discussed the high likelihood of a successful procedure. We discussed the risks of the procedure, including infection, bleeding, tissue injury, clip migration, and inadequate sampling. Informed written consent was given. The usual time-out protocol was performed immediately prior to the procedure. Lesion quadrant: Upper inner quadrant Using sterile technique and 1% lidocaine and 1% lidocaine with epinephrine as local anesthetic, under direct ultrasound visualization, a 14 gauge spring-loaded device was used to perform biopsy of a mass in the 10:30 region of the left breast using a lateral to medial approach. At the conclusion of the procedure heart shaped tissue marker clip was deployed into the biopsy cavity. Follow up 2 view mammogram was performed and dictated separately. IMPRESSION:  Ultrasound guided biopsy of the left breast. No apparent complications. Electronically Signed: By: Lillia Mountain M.D. On: 11/24/2020 09:38  [place] LT RADIO FREQUENCY TAG LOC US GUIDE  Result Date: 12/09/2020 CLINICAL DATA:  Radiofrequency device localization of invasive carcinoma in the upper inner left breast. EXAM: MAMMOGRAPHIC GUIDED RADIOFREQUENCY DEVICE LOCALIZATION OF THE LEFT BREAST COMPARISON:  Previous exam(s) FINDINGS: Patient presents for radiofrequency device localization prior to lumpectomy. I met with the patient and we discussed the procedure of radiofrequency device localization including benefits and alternatives. We discussed the high likelihood of a successful procedure. We discussed the risks of the procedure including infection, bleeding, tissue injury and further surgery. Informed, written consent was given. The usual time-out protocol was performed immediately prior to the procedure. Using mammographic guidance, sterile technique, 1% lidocaine as local anesthesia, a radiofrequency tag was used to localize the mass at the left breast 10:30 position 8 cm from the nipple using a lateral approach. The follow-up mammogram images confirm that the RF device is in the expected location, approximately 5 mm anterolateral to the heart clip, and images are marked for Dr. Christian Mate. Follow-up survey of the patient confirms the presence of the RF device. The patient tolerated the procedure well and was released from the [facility]. IMPRESSION: Radiofrequency device localization of the LEFT breast. No apparent complications. Electronically Signed   By: Ileana Roup M.D.   On: 12/09/2020 13:42    Assessment and plan- Patient is a 70 y.o. 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  However her superior margin was positive for invasive cancer multifocal.  Ideally patient needs reexcision surgery for this.  Dr. Sima Matas as well as Dr. Donella Stade has spoken to the patient.  I would favor reexcision surgery at this time but we will await tumor board discussion next week and  reach out to the patient following that.  Patient will benefit from adjuvant radiation therapy at this time.  Given patient has problems with morbid obesity, borderline performance status and a favorable breast cancer histology of tubular carcinoma I will hold off on getting Oncotype testing to see if she would benefit from adjuvant chemotherapy.  Her tumor is ER positive and she will benefit from adjuvant hormone therapy which I will discuss with her in greater detail when she is coming closer to the end of radiation treatment.  We will obtain a baseline bone density scan at this time.   Visit Diagnosis 1. Malignant neoplasm of upper-inner quadrant of left female breast, unspecified estrogen receptor status ([place])   2. Postmenopausal      Dr. Randa Evens, MD, MPH [facility] at [facility] [phone] 12/22/2020 3:49 PM

## 2020-12-22 NOTE — Consult Note (Signed)
NEW PATIENT EVALUATION  Name: Sharon Ware  MRN: [id number]  Date:   12/22/2020     DOB: [birth year]   This 70 y.o. female patient presents to the clinic for initial evaluation of stage Ia (T1c N0 M0 G1 ER/PR positive HER2 negative tubular carcinoma of the left breast status post wide local excision with superior margin positive.  REFERRING PHYSICIAN: Valerie Roys, DO  CHIEF COMPLAINT:  Chief Complaint  Patient presents with   Consult   Breast Cancer    DIAGNOSIS: The encounter diagnosis was Malignant neoplasm of upper-inner quadrant of left breast in female, estrogen receptor positive ([place]).   PREVIOUS INVESTIGATIONS:  Mammogram and ultrasound reviewed Clinical notes reviewed Pathology report reviewed  HPI: Patient is a 70 year old female morbidly obese who presents with an abnormal mammogram of her left breast.  There was a suspicious lesion verified by ultrasound at the 10:30 position 8 cm from the nipple.  Measures 1.2 x 0.8 x 0.7 cm.  There is no evidence of suspicion of left axillary lymphadenopathy.  She underwent ultrasound-guided biopsy which was positive for invasive mammary carcinoma with features of tubular carcinoma.  Tumor was overall grade 1.  She went on to have a wide local excision showing overall grade one 1 cm tubular carcinoma.  Superior margin had multifocal positivity.  No regional lymph nodes were submitted.  Tumor was ER/PR positive HER2/neu not overexpressed.  She is done well postoperatively.  She specifically denies breast tenderness cough or bone pain.  PLANNED TREATMENT REGIMEN: Whole breast radiation plus scar boost versus reexcision  PAST MEDICAL HISTORY:  has a past medical history of Arthritis, Breast cancer ([place]), Cataract, Diabetes mellitus without complication ([place]), Hyperlipidemia, Hypertension, and Vitamin D deficiency.    PAST SURGICAL HISTORY:  Past Surgical History:  Procedure Laterality Date   ABDOMINAL HYSTERECTOMY     x2   BREAST  BIOPSY Right 1990s   neg   BREAST BIOPSY Right 2000s   neg   BREAST BIOPSY Left 11/24/2020   u/s bx 10:30/8 cmfn-"heart" marker   BREAST EXCISIONAL BIOPSY Right    neg   BREAST LUMPECTOMY WITH RADIOFREQUENCY TAG IDENTIFICATION Left 4/0/9811   Procedure: BREAST LUMPECTOMY WITH RADIOFREQUENCY TAG IDENTIFICATION;  Surgeon: Ronny Bacon, MD;  Location: [facility] ORS;  Service: General;  Laterality: Left;   [place] OF UTERUS     EYE SURGERY  2004   cataract surgery and laser surgery   Salpingo-oophorectomy      FAMILY HISTORY: family history includes Breast cancer (age of onset: 54) in her mother; Cancer in her paternal grandfather; Heart disease in her father and paternal grandfather.  SOCIAL HISTORY:  reports that she has never smoked. She has never used smokeless tobacco. She reports that she does not currently use alcohol. She reports that she does not use drugs.  ALLERGIES: Other, Wound dressing adhesive, Morphine and related, and Latex  MEDICATIONS:  Current Outpatient Medications  Medication Sig Dispense Refill   acetaminophen (TYLENOL) 500 MG tablet Take 500 mg by mouth in the morning and at bedtime.     aspirin 81 MG tablet Take 81 mg by mouth daily.     Cholecalciferol (VITAMIN D3) 125 MCG (5000 UT) TABS Take 5,000 Units by mouth daily.     Glucosamine HCl 1500 MG TABS Take 1,500 mg by mouth in the morning and at bedtime.     ibuprofen (ADVIL) 800 MG tablet Take 1 tablet (800 mg total) by mouth every 8 (eight) hours as needed. (  Patient not taking: Reported on 12/22/2020) 30 tablet 0   losartan (COZAAR) 50 MG tablet Take 1 tablet (50 mg total) by mouth daily. 90 tablet 1   lovastatin (MEVACOR) 10 MG tablet Take 1 tablet (10 mg total) by mouth daily. 90 tablet 1   Menthol, Topical Analgesic, (ICY HOT EX) Apply 1 application topically daily as needed (pain).     metFORMIN (GLUCOPHAGE-XR) 500 MG 24 hr tablet Take 1 tablet (500 mg total) by mouth 2 (two) times daily. 180  tablet 1   Omega-3 Fatty Acids (FISH OIL ULTRA) 1400 MG CAPS Take 1,400 mg by mouth daily.     spironolactone (ALDACTONE) 50 MG tablet Take 1 tablet (50 mg total) by mouth daily. 90 tablet 1   No current facility-administered medications for this encounter.    ECOG PERFORMANCE STATUS:  0 - Asymptomatic  REVIEW OF SYSTEMS: Patient denies any weight loss, fatigue, weakness, fever, chills or night sweats. Patient denies any loss of vision, blurred vision. Patient denies any ringing  of the ears or hearing loss. No irregular heartbeat. Patient denies heart murmur or history of fainting. Patient denies any chest pain or pain radiating to her upper extremities. Patient denies any shortness of breath, difficulty breathing at night, cough or hemoptysis. Patient denies any swelling in the lower legs. Patient denies any nausea vomiting, vomiting of blood, or coffee ground material in the vomitus. Patient denies any stomach pain. Patient states has had normal bowel movements no significant constipation or diarrhea. Patient denies any dysuria, hematuria or significant nocturia. Patient denies any problems walking, swelling in the joints or loss of balance. Patient denies any skin changes, loss of hair or loss of weight. Patient denies any excessive worrying or anxiety or significant depression. Patient denies any problems with insomnia. Patient denies excessive thirst, polyuria, polydipsia. Patient denies any swollen glands, patient denies easy bruising or easy bleeding. Patient denies any recent infections, allergies or URI. Patient "s visual fields have not changed significantly in recent time.   PHYSICAL EXAM: BP 134/78   Pulse 84   Temp 97.9 F (36.6 C) (Tympanic)  Wheelchair-bound morbidly obese patient in NAD.  She is status post wide local excision of left breast which is healed well.  No dominant masses noted in either breast.  No axillary or supraclavicular adenopathy is identified.  Well-developed  well-nourished patient in NAD. HEENT reveals PERLA, EOMI, discs not visualized.  Oral cavity is clear. No oral mucosal lesions are identified. Neck is clear without evidence of cervical or supraclavicular adenopathy. Lungs are clear to A&P. Cardiac examination is essentially unremarkable with regular rate and rhythm without murmur rub or thrill. Abdomen is benign with no organomegaly or masses noted. Motor sensory and DTR levels are equal and symmetric in the upper and lower extremities. Cranial nerves II through XII are grossly intact. Proprioception is intact. No peripheral adenopathy or edema is identified. No motor or sensory levels are noted. Crude visual fields are within normal range.  LABORATORY DATA: Pathology report reviewed    RADIOLOGY RESULTS: Mammogram and ultrasound reviewed compatible with above-stated findings   IMPRESSION: Stage Ia ER/PR positive invasive mammary carcinoma grade 1 tubular type of the left breast in 70 year old female  PLAN: At this time I have gone over recommendations with the patient.  Certainly reexcision would be standard of care although based on the small size excellent well differentiated nature of her disease would offer whole breast radiation in a hypofractionated regimen over 3 weeks plus a 1600  cGy scar boost.  I believe this would bring her risk of recurrence in the left breast to less than 5%.  I have discussed the case with Dr. Janese Banks as well as Dr. Otila Kluver and we will present her case at tumor conference to review pathology and make a final determination.  Patient comprehends her recommendations well.  I would like to take this opportunity to thank you for allowing me to participate in the care of your patient.[name], MD

## 2020-12-25 ENCOUNTER — Ambulatory Visit: Payer: Medicare Other

## 2020-12-25 ENCOUNTER — Encounter: Payer: Self-pay | Admitting: Surgery

## 2020-12-25 ENCOUNTER — Encounter: Payer: Self-pay | Admitting: Oncology

## 2020-12-25 NOTE — Telephone Encounter (Signed)
Pts spouse and came an picked up the the handicap placard form.

## 2021-01-02 ENCOUNTER — Ambulatory Visit
Admission: RE | Admit: 2021-01-02 | Discharge: 2021-01-02 | Disposition: A | Discharge status: Home / Self Care | Payer: Medicare Other | Source: Ambulatory Visit | Attending: Radiation Oncology | Admitting: Radiation Oncology

## 2021-01-02 ENCOUNTER — Other Ambulatory Visit: Payer: Self-pay | Admitting: *Deleted

## 2021-01-02 ENCOUNTER — Encounter: Payer: Self-pay | Admitting: Nurse Practitioner

## 2021-01-02 ENCOUNTER — Ambulatory Visit (INDEPENDENT_AMBULATORY_CARE_PROVIDER_SITE_OTHER): Payer: Medicare Other | Admitting: Nurse Practitioner

## 2021-01-02 ENCOUNTER — Other Ambulatory Visit: Payer: Self-pay

## 2021-01-02 VITALS — BP 137/85 | HR 76

## 2021-01-02 DIAGNOSIS — C50212 Malignant neoplasm of upper-inner quadrant of left female breast: Secondary | ICD-10-CM | POA: Insufficient documentation

## 2021-01-02 DIAGNOSIS — Z170 Estrogen receptor positive status [ER+]: Secondary | ICD-10-CM

## 2021-01-02 DIAGNOSIS — R300 Dysuria: Secondary | ICD-10-CM

## 2021-01-02 DIAGNOSIS — N3001 Acute cystitis with hematuria: Secondary | ICD-10-CM | POA: Diagnosis not present

## 2021-01-02 LAB — URINALYSIS, ROUTINE W REFLEX MICROSCOPIC
Bilirubin, UA: NEGATIVE
Glucose, UA: NEGATIVE
Ketones, UA: NEGATIVE
Nitrite, UA: POSITIVE — AB
Protein,UA: NEGATIVE
Specific Gravity, UA: 1.015 (ref 1.005–1.030)
Urobilinogen, Ur: 0.2 mg/dL (ref 0.2–1.0)
pH, UA: 5 (ref 5.0–7.5)

## 2021-01-02 LAB — MICROSCOPIC EXAMINATION

## 2021-01-02 MED ORDER — SULFAMETHOXAZOLE-TRIMETHOPRIM 800-160 MG PO TABS: 1.0000 | ORAL_TABLET | Freq: Two times a day (BID) | ORAL | 0 refills | Status: DC

## 2021-01-02 NOTE — Progress Notes (Signed)
Acute Office Visit  Subjective:    Patient ID: Sharon Ware, female    DOB: October 27, 1950, 70 y.o.   MRN: [id number]  Chief Complaint  Patient presents with   Dysuria    HPI Patient is in today for dysuria and cloudy urine that started yesterday. She is about to start radiation for breast cancer and wants to make sure she is treated for UTI if she has one.   URINARY SYMPTOMS  Dysuria: burning Urinary frequency: no Urgency: yes Small volume voids: no Symptom severity:  mild Urinary incontinence: yes Foul odor: no Hematuria: no Abdominal pain: no Back pain: no Suprapubic pain/pressure: no Flank pain: no Fever:  no Vomiting: no Relief with cranberry juice:  n/a Relief with pyridium:  n/a Status: worse Previous urinary tract infection: yes Recurrent urinary tract infection: no Treatments attempted: none    Past Medical History:  Diagnosis Date   Arthritis    Breast cancer ([place])    Cataract    Diabetes mellitus without complication ([place])    Hyperlipidemia    Hypertension    Vitamin D deficiency     Past Surgical History:  Procedure Laterality Date   ABDOMINAL HYSTERECTOMY     x2   BREAST BIOPSY Right 1990s   neg   BREAST BIOPSY Right 2000s   neg   BREAST BIOPSY Left 11/24/2020   u/s bx 10:30/8 cmfn-"heart" marker   BREAST EXCISIONAL BIOPSY Right    neg   BREAST LUMPECTOMY WITH RADIOFREQUENCY TAG IDENTIFICATION Left 06/09/4825   Procedure: BREAST LUMPECTOMY WITH RADIOFREQUENCY TAG IDENTIFICATION;  Surgeon: Ronny Bacon, MD;  Location: [facility] ORS;  Service: General;  Laterality: Left;   DILATION AND CURETTAGE OF UTERUS     EYE SURGERY  2004   cataract surgery and laser surgery   Salpingo-oophorectomy      Family History  Problem Relation Age of Onset   Breast cancer Mother 46   Heart disease Father    Cancer Paternal Grandfather    Heart disease Paternal Grandfather     Social History   Socioeconomic History   Marital status: Married     Spouse name: Not on file   Number of children: Not on file   Years of education: Not on file   Highest education level: Not on file  Occupational History   Occupation: retired  Tobacco Use   Smoking status: Never   Smokeless tobacco: Never  Vaping Use   Vaping Use: Never used  Substance and Sexual Activity   Alcohol use: Not Currently   Drug use: Never   Sexual activity: Not Currently  Other Topics Concern   Not on file  Social History Narrative   Not on file   Social Determinants of Health   Financial Resource Strain: Low Risk    Difficulty of Paying Living Expenses: Not hard at all  Food Insecurity: No Food Insecurity   Worried About Charity fundraiser in the Last Year: Never true   Arboriculturist in the Last Year: Never true  Transportation Needs: No Transportation Needs   Lack of Transportation (Medical): No   Lack of Transportation (Non-Medical): No  Physical Activity: Inactive   Days of Exercise per Week: 0 days   Minutes of Exercise per Session: 0 min  Stress: No Stress Concern Present   Feeling of Stress : Not at all  Social Connections: Not on file  Intimate Partner Violence: Not on file    Outpatient Medications Prior to Visit  Medication Sig Dispense Refill   acetaminophen (TYLENOL) 500 MG tablet Take 500 mg by mouth in the morning and at bedtime.     aspirin 81 MG tablet Take 81 mg by mouth daily.     Cholecalciferol (VITAMIN D3) 125 MCG (5000 UT) TABS Take 5,000 Units by mouth daily.     Glucosamine HCl 1500 MG TABS Take 1,500 mg by mouth in the morning and at bedtime.     ibuprofen (ADVIL) 800 MG tablet Take 1 tablet (800 mg total) by mouth every 8 (eight) hours as needed. 30 tablet 0   losartan (COZAAR) 50 MG tablet Take 1 tablet (50 mg total) by mouth daily. 90 tablet 1   lovastatin (MEVACOR) 10 MG tablet Take 1 tablet (10 mg total) by mouth daily. 90 tablet 1   Menthol, Topical Analgesic, (ICY HOT EX) Apply 1 application topically daily as needed  (pain).     metFORMIN (GLUCOPHAGE-XR) 500 MG 24 hr tablet Take 1 tablet (500 mg total) by mouth 2 (two) times daily. 180 tablet 1   Omega-3 Fatty Acids (FISH OIL ULTRA) 1400 MG CAPS Take 1,400 mg by mouth daily.     spironolactone (ALDACTONE) 50 MG tablet Take 1 tablet (50 mg total) by mouth daily. 90 tablet 1   No facility-administered medications prior to visit.    Allergies  Allergen Reactions   Other Anaphylaxis    Honey Dew Melon   Wound Dressing Adhesive Rash   Morphine And Related Other (See Comments)    Hypotension   Latex Rash    Review of Systems  Constitutional:  Positive for fatigue.  Respiratory: Negative.    Cardiovascular: Negative.   Gastrointestinal: Negative.   Genitourinary:  Positive for dysuria, frequency and urgency.  Skin: Negative.   Neurological: Negative.       Objective:    Physical Exam Vitals and nursing note reviewed.  Constitutional:      General: She is not in acute distress.    Appearance: Normal appearance.  HENT:     Head: Normocephalic.  Eyes:     Conjunctiva/sclera: Conjunctivae normal.  Cardiovascular:     Rate and Rhythm: Normal rate and regular rhythm.     Pulses: Normal pulses.     Heart sounds: Normal heart sounds.  Pulmonary:     Effort: Pulmonary effort is normal.     Breath sounds: Normal breath sounds.  Abdominal:     Palpations: Abdomen is soft.     Tenderness: There is no abdominal tenderness. There is no right CVA tenderness or left CVA tenderness.  Musculoskeletal:     Cervical back: Normal range of motion.  Skin:    General: Skin is warm.  Neurological:     General: No focal deficit present.     Mental Status: She is alert and oriented to person, place, and time.  Psychiatric:        Mood and Affect: Mood normal.        Behavior: Behavior normal.        Thought Content: Thought content normal.        Judgment: Judgment normal.    BP 137/85   Pulse 76  Wt Readings from Last 3 Encounters:  12/22/20 (!)  363 lb 4.8 oz (164.8 kg)  12/10/20 (!) 360 lb 10.8 oz (163.6 kg)  12/05/20 (!) 360 lb (163.3 kg)    There are no preventive care reminders to display for this patient.  There are no preventive care reminders to display  for this patient.   Lab Results  Component Value Date   TSH 1.660 02/21/2020   Lab Results  Component Value Date   WBC 8.6 12/09/2020   HGB 16.3 (H) 12/09/2020   HCT 48.6 (H) 12/09/2020   MCV 91.9 12/09/2020   PLT 221 12/09/2020   Lab Results  Component Value Date   NA 138 12/09/2020   K 4.5 12/09/2020   CO2 29 12/09/2020   GLUCOSE 191 (H) 12/09/2020   BUN 25 (H) 12/09/2020   CREATININE 0.94 12/09/2020   BILITOT 0.8 12/09/2020   ALKPHOS 50 12/09/2020   AST 19 12/09/2020   ALT 22 12/09/2020   PROT 7.8 12/09/2020   ALBUMIN 3.9 12/09/2020   CALCIUM 9.3 12/09/2020   ANIONGAP 10 12/09/2020   EGFR 79 08/22/2020   Lab Results  Component Value Date   CHOL 163 08/22/2020   Lab Results  Component Value Date   HDL 43 08/22/2020   Lab Results  Component Value Date   LDLCALC 90 08/22/2020   Lab Results  Component Value Date   TRIG 175 (H) 08/22/2020   No results found for: CHOLHDL Lab Results  Component Value Date   HGBA1C 6.7 12/04/2020       Assessment & Plan:   Problem List Items Addressed This Visit   None Visit Diagnoses     Acute cystitis with hematuria    -  Primary   Will treat with bactrim BIDx 7 days. Send urine for culture. Encourage fluids. F/U if symptoms worsen or don't improve.    Relevant Orders   Urine Culture   Dysuria       U/A positive for many bacteria, 2+ leukocytes, nitrates and blood. Will treat for UTI   Relevant Orders   Urinalysis, Routine w reflex microscopic        Meds ordered this encounter  Medications   sulfamethoxazole-trimethoprim (BACTRIM DS) 800-160 MG tablet    Sig: Take 1 tablet by mouth 2 (two) times daily.    Dispense:  14 tablet    Refill:  0     Charyl Dancer, NP

## 2021-01-07 DIAGNOSIS — Z79811 Long term (current) use of aromatase inhibitors: Secondary | ICD-10-CM | POA: Diagnosis not present

## 2021-01-07 DIAGNOSIS — Z170 Estrogen receptor positive status [ER+]: Secondary | ICD-10-CM | POA: Insufficient documentation

## 2021-01-07 DIAGNOSIS — C50212 Malignant neoplasm of upper-inner quadrant of left female breast: Secondary | ICD-10-CM | POA: Insufficient documentation

## 2021-01-08 ENCOUNTER — Inpatient Hospital Stay: Admission: RE | Admit: 2021-01-08 | Payer: Medicare Other | Source: Ambulatory Visit

## 2021-01-08 ENCOUNTER — Ambulatory Visit: Admission: RE | Admit: 2021-01-08 | Payer: Medicare Other | Source: Ambulatory Visit

## 2021-01-08 DIAGNOSIS — C50212 Malignant neoplasm of upper-inner quadrant of left female breast: Secondary | ICD-10-CM | POA: Diagnosis not present

## 2021-01-09 ENCOUNTER — Telehealth: Payer: Self-pay

## 2021-01-09 NOTE — Telephone Encounter (Signed)
Copied from [place] [phone] [phone]. Topic: General - Other >> Jan 08, 2021  3:41 PM [name], Sharon Ware wrote: Reason for CRM: Pt called in regarding Ware urine culture she had done on 9/30, pt wanted to know the results or have them uploaded to her MyChart, please advise.

## 2021-01-09 NOTE — Telephone Encounter (Signed)
Patient was notified that her urine culture results have not been received back yet. Patient was informed that the weekends aren't included in the business days to received results. Patient verbalized understanding.

## 2021-01-09 NOTE — Telephone Encounter (Signed)
Te results are not back yet.

## 2021-01-11 LAB — URINE CULTURE

## 2021-01-12 ENCOUNTER — Ambulatory Visit
Admission: RE | Admit: 2021-01-12 | Discharge: 2021-01-12 | Disposition: A | Discharge status: Home / Self Care | Payer: Medicare Other | Source: Ambulatory Visit | Attending: Radiation Oncology | Admitting: Radiation Oncology

## 2021-01-12 DIAGNOSIS — C50212 Malignant neoplasm of upper-inner quadrant of left female breast: Secondary | ICD-10-CM | POA: Diagnosis not present

## 2021-01-13 ENCOUNTER — Ambulatory Visit
Admission: RE | Admit: 2021-01-13 | Discharge: 2021-01-13 | Disposition: A | Discharge status: Home / Self Care | Payer: Medicare Other | Source: Ambulatory Visit | Attending: Radiation Oncology | Admitting: Radiation Oncology

## 2021-01-13 DIAGNOSIS — C50212 Malignant neoplasm of upper-inner quadrant of left female breast: Secondary | ICD-10-CM | POA: Diagnosis not present

## 2021-01-14 ENCOUNTER — Ambulatory Visit
Admission: RE | Admit: 2021-01-14 | Discharge: 2021-01-14 | Disposition: A | Discharge status: Home / Self Care | Payer: Medicare Other | Source: Ambulatory Visit | Attending: Radiation Oncology | Admitting: Radiation Oncology

## 2021-01-14 DIAGNOSIS — C50212 Malignant neoplasm of upper-inner quadrant of left female breast: Secondary | ICD-10-CM | POA: Diagnosis not present

## 2021-01-15 ENCOUNTER — Ambulatory Visit
Admission: RE | Admit: 2021-01-15 | Discharge: 2021-01-15 | Disposition: A | Discharge status: Home / Self Care | Payer: Medicare Other | Source: Ambulatory Visit | Attending: Radiation Oncology | Admitting: Radiation Oncology

## 2021-01-15 DIAGNOSIS — C50212 Malignant neoplasm of upper-inner quadrant of left female breast: Secondary | ICD-10-CM | POA: Diagnosis not present

## 2021-01-16 ENCOUNTER — Ambulatory Visit
Admission: RE | Admit: 2021-01-16 | Discharge: 2021-01-16 | Disposition: A | Discharge status: Home / Self Care | Payer: Medicare Other | Source: Ambulatory Visit | Attending: Radiation Oncology | Admitting: Radiation Oncology

## 2021-01-16 DIAGNOSIS — C50212 Malignant neoplasm of upper-inner quadrant of left female breast: Secondary | ICD-10-CM | POA: Diagnosis not present

## 2021-01-19 ENCOUNTER — Other Ambulatory Visit: Payer: Self-pay

## 2021-01-19 ENCOUNTER — Encounter: Payer: Self-pay | Admitting: Oncology

## 2021-01-19 ENCOUNTER — Ambulatory Visit
Admission: RE | Admit: 2021-01-19 | Discharge: 2021-01-19 | Disposition: A | Discharge status: Home / Self Care | Payer: Medicare Other | Source: Ambulatory Visit | Attending: Radiation Oncology | Admitting: Radiation Oncology

## 2021-01-19 ENCOUNTER — Ambulatory Visit
Admission: RE | Admit: 2021-01-19 | Discharge: 2021-01-19 | Disposition: A | Discharge status: Home / Self Care | Payer: Medicare Other | Source: Ambulatory Visit | Attending: Oncology | Admitting: Oncology

## 2021-01-19 ENCOUNTER — Inpatient Hospital Stay: Payer: Medicare Other | Attending: Oncology | Admitting: Oncology

## 2021-01-19 VITALS — BP 148/74 | HR 79 | Temp 99.0°F | Resp 20 | Wt 364.5 lb

## 2021-01-19 DIAGNOSIS — C50212 Malignant neoplasm of upper-inner quadrant of left female breast: Secondary | ICD-10-CM | POA: Diagnosis not present

## 2021-01-19 DIAGNOSIS — Z79811 Long term (current) use of aromatase inhibitors: Secondary | ICD-10-CM | POA: Insufficient documentation

## 2021-01-19 DIAGNOSIS — Z780 Asymptomatic menopausal state: Secondary | ICD-10-CM | POA: Insufficient documentation

## 2021-01-19 DIAGNOSIS — Z170 Estrogen receptor positive status [ER+]: Secondary | ICD-10-CM | POA: Diagnosis not present

## 2021-01-19 MED ORDER — LETROZOLE 2.5 MG PO TABS: 2.5000 mg | ORAL_TABLET | Freq: Every day | ORAL | 0 refills | Status: DC

## 2021-01-19 NOTE — Progress Notes (Signed)
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  Ultrasound showed a 1.2 x 0.8 x 0.7 cm mass 8 cm from the nipple at the 10:30 position.  No suspicious left axillary adenopathy.  This was biopsied and was consistent with invasive mammary carcinoma with tubular features grade 1.  No lymphovascular invasion identified.  ER/PR greater than 90% positive HER2 negative   Final pathology from lumpectomy showed tubular carcinoma grade 10 mm.  Multifocal superior margin positive.  Mutual decision between surgery radiation oncology and patient was to forego reexcision and proceed with adjuvant radiation treatment with radiation boost to the superior margin  Interval history-patient will be completing radiation treatment on 02/12/2021.  Overall tolerating radiation well so far.  ECOG PS- 1 Pain scale- 0   Review of systems- Review of Systems  Constitutional:  Negative for chills, fever, malaise/fatigue and weight loss.  HENT:  Negative for congestion, ear discharge and nosebleeds.   Eyes:  Negative for blurred vision.  Respiratory:  Negative for cough, hemoptysis, sputum production, shortness of breath and wheezing.   Cardiovascular:  Negative for chest  pain, palpitations, orthopnea and claudication.  Gastrointestinal:  Negative for abdominal pain, blood in stool, constipation, diarrhea, heartburn, melena, nausea and vomiting.  Genitourinary:  Negative for dysuria, flank pain, frequency, hematuria and urgency.  Musculoskeletal:  Negative for back pain, joint pain and myalgias.  Skin:  Negative for rash.  Neurological:  Negative for dizziness, tingling, focal weakness, seizures, weakness and headaches.  Endo/Heme/Allergies:  Does not bruise/bleed easily.  Psychiatric/Behavioral:  Negative for depression and suicidal ideas. The patient does not have insomnia.      Allergies  Allergen Reactions   Other Anaphylaxis    Honey Dew Melon   Wound Dressing Adhesive Rash   Morphine And Related Other (See Comments)    Hypotension   Latex Rash     Past Medical History:  Diagnosis Date   Arthritis    Breast cancer ([place])    Cataract    Diabetes mellitus without complication ([place])    Hyperlipidemia    Hypertension    Vitamin D deficiency      Past Surgical History:  Procedure Laterality Date   ABDOMINAL HYSTERECTOMY     x2   BREAST BIOPSY Right 1990s   neg   BREAST BIOPSY Right 2000s   neg   BREAST BIOPSY Left 11/24/2020   u/s bx 10:30/8 cmfn-"heart" marker   BREAST EXCISIONAL BIOPSY Right    neg   BREAST LUMPECTOMY WITH RADIOFREQUENCY TAG IDENTIFICATION Left 05/13/32   Procedure: BREAST LUMPECTOMY WITH RADIOFREQUENCY TAG IDENTIFICATION;  Surgeon: Ronny Bacon, MD;  Location: [facility] ORS;  Service: General;  Laterality: Left;   DILATION AND CURETTAGE OF UTERUS     EYE SURGERY  2004   cataract surgery and laser surgery   Salpingo-oophorectomy  Social History   Socioeconomic History   Marital status: Married    Spouse name: Not on file   Number of children: Not on file   Years of education: Not on file   Highest education level: Not on file  Occupational History   Occupation: retired  Tobacco Use   Smoking status:  Never   Smokeless tobacco: Never  Vaping Use   Vaping Use: Never used  Substance and Sexual Activity   Alcohol use: Not Currently   Drug use: Never   Sexual activity: Not Currently  Other Topics Concern   Not on file  Social History Narrative   Not on file   Social Determinants of Health   Financial Resource Strain: Low Risk    Difficulty of Paying Living Expenses: Not hard at all  Food Insecurity: No Food Insecurity   Worried About Charity fundraiser in the Last Year: Never true   [place] in the Last Year: Never true  Transportation Needs: No Transportation Needs   Lack of Transportation (Medical): No   Lack of Transportation (Non-Medical): No  Physical Activity: Inactive   Days of Exercise per Week: 0 days   Minutes of Exercise per Session: 0 min  Stress: No Stress Concern Present   Feeling of Stress : Not at all  Social Connections: Not on file  Intimate Partner Violence: Not on file    Family History  Problem Relation Age of Onset   Breast cancer Mother 28   Heart disease Father    Cancer Paternal Grandfather    Heart disease Paternal Grandfather      Current Outpatient Medications:    acetaminophen (TYLENOL) 500 MG tablet, Take 500 mg by mouth in the morning and at bedtime., Disp: , Rfl:    aspirin 81 MG tablet, Take 81 mg by mouth daily., Disp: , Rfl:    Cholecalciferol (VITAMIN D3) 125 MCG (5000 UT) TABS, Take 5,000 Units by mouth daily., Disp: , Rfl:    Glucosamine HCl 1500 MG TABS, Take 1,500 mg by mouth in the morning and at bedtime., Disp: , Rfl:    losartan (COZAAR) 50 MG tablet, Take 1 tablet (50 mg total) by mouth daily., Disp: 90 tablet, Rfl: 1   lovastatin (MEVACOR) 10 MG tablet, Take 1 tablet (10 mg total) by mouth daily., Disp: 90 tablet, Rfl: 1   Menthol, Topical Analgesic, (ICY HOT EX), Apply 1 application topically daily as needed (pain)., Disp: , Rfl:    metFORMIN (GLUCOPHAGE-XR) 500 MG 24 hr tablet, Take 1 tablet (500 mg total) by mouth  2 (two) times daily., Disp: 180 tablet, Rfl: 1   Omega-3 Fatty Acids (FISH OIL ULTRA) 1400 MG CAPS, Take 1,400 mg by mouth daily., Disp: , Rfl:    spironolactone (ALDACTONE) 50 MG tablet, Take 1 tablet (50 mg total) by mouth daily., Disp: 90 tablet, Rfl: 1   ibuprofen (ADVIL) 800 MG tablet, Take 1 tablet (800 mg total) by mouth every 8 (eight) hours as needed. (Patient not taking: Reported on 01/19/2021), Disp: 30 tablet, Rfl: 0   letrozole (FEMARA) 2.5 MG tablet, Take 1 tablet (2.5 mg total) by mouth daily., Disp: 30 tablet, Rfl: 0   sulfamethoxazole-trimethoprim (BACTRIM DS) 800-160 MG tablet, Take 1 tablet by mouth 2 (two) times daily. (Patient not taking: Reported on 01/19/2021), Disp: 14 tablet, Rfl: 0  Physical exam:  Vitals:   01/19/21 1039  BP: (!) 148/74  Pulse: 79  Resp: 20  Temp: 99 F (37.2  C)  SpO2: 93%  Weight: (!) 364 lb 8 oz (165.3 kg)   Physical Exam Constitutional:      General: She is not in acute distress.    Appearance: She is obese.  Cardiovascular:     Rate and Rhythm: Normal rate and regular rhythm.     Heart sounds: Normal heart sounds.  Pulmonary:     Effort: Pulmonary effort is normal.     Breath sounds: Normal breath sounds.  Abdominal:     General: Bowel sounds are normal.     Palpations: Abdomen is soft.  Skin:    General: Skin is warm and dry.  Neurological:     Mental Status: She is alert and oriented to person, place, and time.     CMP Latest Ref Rng & Units 12/09/2020  Glucose 70 - 99 mg/dL 191(H)  BUN 8 - 23 mg/dL 25(H)  Creatinine 0.44 - 1.00 mg/dL 0.94  Sodium 135 - 145 mmol/L 138  Potassium 3.5 - 5.1 mmol/L 4.5  Chloride 98 - 111 mmol/L 99  CO2 22 - 32 mmol/L 29  Calcium 8.9 - 10.3 mg/dL 9.3  Total Protein 6.5 - 8.1 g/dL 7.8  Total Bilirubin 0.3 - 1.2 mg/dL 0.8  Alkaline Phos 38 - 126 U/L 50  AST 15 - 41 U/L 19  ALT 0 - 44 U/L 22   CBC Latest Ref Rng & Units 12/09/2020  WBC 4.0 - 10.5 K/uL 8.6  Hemoglobin 12.0 - 15.0 g/dL 16.3(H)   Hematocrit 36.0 - 46.0 % 48.6(H)  Platelets 150 - 400 K/uL 221     Assessment and plan- Patient is a 70 y.o. 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  She is here to discuss bone density scan results and further management  On final pathology patient had multifocal positive superior margin.However after discussion with radiation oncology and Dr. Christian Mate it was decided to forego reexcision surgery and proceed with adjuvant radiation treatment.  She is currently undergoing adjuvant radiation treatment which she will complete in about 3 weeks.  I was supposed to see her today to discuss the results of her bone density scan.  She was supposed to get it done last week but the machine broke down and she is only getting get it this evening.  I therefore do not have those results with me.  Given that her tumor was ER PR positive hormone therapy is indicated at this time.  Idiscussed the role for hormone therapy. Given that she is postmenopausal I would favor 5 years of adjuvant hormone therapy with aromatase inhibitor. I discussed the risks and benefits of letrozole including all but not limited to fatigue, hypercholesterolemia, hot flashes, arthralgias and worsening bone health.  Patient will also need to be on calcium 1200 mg along with vitamin D 800 international units.  We will obtain a baseline bone density scan written information about letrozole given to the patient. I would like her to finish radiation therapy and start hormone therapy thereafter. Patient verbalized understanding and agrees to proceed  I will see her in early January after she starts taking letrozole and has been taking it for about 6 weeks.  I will discuss the results of bone density scan at that time.  Treatment will be given with a curative intent    Visit Diagnosis 1. Malignant neoplasm of upper-inner quadrant of left female breast,  unspecified estrogen receptor status ([place])   2.  Malignant neoplasm of upper-inner quadrant of left breast in female, estrogen receptor positive ([place])      Dr. Randa Evens, MD, MPH [facility] [phone] 01/19/2021 12:55 PM

## 2021-01-20 ENCOUNTER — Ambulatory Visit
Admission: RE | Admit: 2021-01-20 | Discharge: 2021-01-20 | Disposition: A | Discharge status: Home / Self Care | Payer: Medicare Other | Source: Ambulatory Visit | Attending: Radiation Oncology | Admitting: Radiation Oncology

## 2021-01-20 DIAGNOSIS — C50212 Malignant neoplasm of upper-inner quadrant of left female breast: Secondary | ICD-10-CM | POA: Diagnosis not present

## 2021-01-21 ENCOUNTER — Ambulatory Visit
Admission: RE | Admit: 2021-01-21 | Discharge: 2021-01-21 | Disposition: A | Discharge status: Home / Self Care | Payer: Medicare Other | Source: Ambulatory Visit | Attending: Radiation Oncology | Admitting: Radiation Oncology

## 2021-01-21 DIAGNOSIS — C50212 Malignant neoplasm of upper-inner quadrant of left female breast: Secondary | ICD-10-CM | POA: Diagnosis not present

## 2021-01-22 ENCOUNTER — Ambulatory Visit
Admission: RE | Admit: 2021-01-22 | Discharge: 2021-01-22 | Disposition: A | Discharge status: Home / Self Care | Payer: Medicare Other | Source: Ambulatory Visit | Attending: Radiation Oncology | Admitting: Radiation Oncology

## 2021-01-22 DIAGNOSIS — C50212 Malignant neoplasm of upper-inner quadrant of left female breast: Secondary | ICD-10-CM | POA: Diagnosis not present

## 2021-01-23 ENCOUNTER — Ambulatory Visit
Admission: RE | Admit: 2021-01-23 | Discharge: 2021-01-23 | Disposition: A | Discharge status: Home / Self Care | Payer: Medicare Other | Source: Ambulatory Visit | Attending: Radiation Oncology | Admitting: Radiation Oncology

## 2021-01-23 DIAGNOSIS — C50212 Malignant neoplasm of upper-inner quadrant of left female breast: Secondary | ICD-10-CM | POA: Diagnosis not present

## 2021-01-26 ENCOUNTER — Ambulatory Visit
Admission: RE | Admit: 2021-01-26 | Discharge: 2021-01-26 | Disposition: A | Discharge status: Home / Self Care | Payer: Medicare Other | Source: Ambulatory Visit | Attending: Radiation Oncology | Admitting: Radiation Oncology

## 2021-01-26 ENCOUNTER — Other Ambulatory Visit: Payer: Self-pay

## 2021-01-26 ENCOUNTER — Inpatient Hospital Stay: Payer: Medicare Other

## 2021-01-26 DIAGNOSIS — C50212 Malignant neoplasm of upper-inner quadrant of left female breast: Secondary | ICD-10-CM | POA: Diagnosis not present

## 2021-01-26 DIAGNOSIS — Z170 Estrogen receptor positive status [ER+]: Secondary | ICD-10-CM

## 2021-01-26 LAB — CBC
HCT: 47 % — ABNORMAL HIGH (ref 36.0–46.0)
Hemoglobin: 15.4 g/dL — ABNORMAL HIGH (ref 12.0–15.0)
MCH: 30.3 pg (ref 26.0–34.0)
MCHC: 32.8 g/dL (ref 30.0–36.0)
MCV: 92.3 fL (ref 80.0–100.0)
Platelets: 217 10*3/uL (ref 150–400)
RBC: 5.09 MIL/uL (ref 3.87–5.11)
RDW: 13.1 % (ref 11.5–15.5)
WBC: 7.9 10*3/uL (ref 4.0–10.5)
nRBC: 0 % (ref 0.0–0.2)

## 2021-01-27 ENCOUNTER — Ambulatory Visit
Admission: RE | Admit: 2021-01-27 | Discharge: 2021-01-27 | Disposition: A | Discharge status: Home / Self Care | Payer: Medicare Other | Source: Ambulatory Visit | Attending: Radiation Oncology | Admitting: Radiation Oncology

## 2021-01-27 DIAGNOSIS — C50212 Malignant neoplasm of upper-inner quadrant of left female breast: Secondary | ICD-10-CM | POA: Diagnosis not present

## 2021-01-28 ENCOUNTER — Ambulatory Visit
Admission: RE | Admit: 2021-01-28 | Discharge: 2021-01-28 | Disposition: A | Discharge status: Home / Self Care | Payer: Medicare Other | Source: Ambulatory Visit | Attending: Radiation Oncology | Admitting: Radiation Oncology

## 2021-01-28 DIAGNOSIS — C50212 Malignant neoplasm of upper-inner quadrant of left female breast: Secondary | ICD-10-CM | POA: Diagnosis not present

## 2021-01-29 ENCOUNTER — Ambulatory Visit: Payer: Self-pay | Admitting: *Deleted

## 2021-01-29 ENCOUNTER — Ambulatory Visit
Admission: RE | Admit: 2021-01-29 | Discharge: 2021-01-29 | Disposition: A | Discharge status: Home / Self Care | Payer: Medicare Other | Source: Ambulatory Visit | Attending: Radiation Oncology | Admitting: Radiation Oncology

## 2021-01-29 DIAGNOSIS — C50212 Malignant neoplasm of upper-inner quadrant of left female breast: Secondary | ICD-10-CM | POA: Diagnosis not present

## 2021-01-29 NOTE — Telephone Encounter (Signed)
Patient is calling to report she has had rise in her BP readings. Patient states she is not having symptoms- but does report swelling in left extremities that is not new- comes and goes.

## 2021-01-29 NOTE — Telephone Encounter (Signed)
Reason for Disposition  Systolic BP  >= 129 OR Diastolic >= 290  Answer Assessment - Initial Assessment Questions 1. BLOOD PRESSURE: "What is the blood pressure?" "Did you take at least two measurements 5 minutes apart?"     184/112 R 150/88 L, 152/82 R 120/75 L 2. ONSET: "When did you take your blood pressure?"     7am, 8:50am 3. HOW: "How did you obtain the blood pressure?" (e.g., visiting nurse, automatic home BP monitor)     Automatic cuff- wrist 4. HISTORY: "Do you have a history of high blood pressure?"     yes 5. MEDICATIONS: "Are you taking any medications for blood pressure?" "Have you missed any doses recently?"     Yes- no missed dosing 6. OTHER SYMPTOMS: "Do you have any symptoms?" (e.g., headache, chest pain, blurred vision, difficulty breathing, weakness)     Slight swelling on left extremities- not uncommon 7. PREGNANCY: "Is there any chance you are pregnant?" "When was your last menstrual period?"     Na  Protocols used: Blood Pressure - High-A-AH

## 2021-01-29 NOTE — Progress Notes (Signed)
Established Patient Office Visit  Subjective:  Patient ID: Sharon Ware, female    DOB: 26-May-1950  Age: 70 y.o. MRN: [id number]  CC:  Chief Complaint  Patient presents with   Hypertension    Has been running on the high side for the past few weeks, upwards to 178/109 on occasion.    Edema    Patient states that her left hand and left foot have been swollen for the past few weeks, as well    HPI Sharon Ware presents for elevated blood pressure readings at home. Blood pressure has been as high as 170s/100s. Blood pressure readings have also been elevated at [facility] on Tuesdays. Upon chart review, they were noted to be 140s/70s. She has had her current blood pressure cuff for "many" years. She also endorses increased swelling in her left arm and leg than normal. She is currently in the middle of radiation treatments for breast cancer. She denies shortness of breath, chest pain, headaches, and dizziness.    Past Medical History:  Diagnosis Date   Arthritis    Breast cancer ([place])    Cataract    Diabetes mellitus without complication ([place])    Hyperlipidemia    Hypertension    Vitamin D deficiency     Past Surgical History:  Procedure Laterality Date   ABDOMINAL HYSTERECTOMY     x2   BREAST BIOPSY Right 1990s   neg   BREAST BIOPSY Right 2000s   neg   BREAST BIOPSY Left 11/24/2020   u/s bx 10:30/8 cmfn-"heart" marker   BREAST EXCISIONAL BIOPSY Right    neg   BREAST LUMPECTOMY WITH RADIOFREQUENCY TAG IDENTIFICATION Left 11/03/173   Procedure: BREAST LUMPECTOMY WITH RADIOFREQUENCY TAG IDENTIFICATION;  Surgeon: Ronny Bacon, MD;  Location: [facility] ORS;  Service: General;  Laterality: Left;   DILATION AND CURETTAGE OF UTERUS     EYE SURGERY  2004   cataract surgery and laser surgery   Salpingo-oophorectomy      Family History  Problem Relation Age of Onset   Breast cancer Mother 12   Heart disease Father    Cancer Paternal Grandfather    Heart disease  Paternal Grandfather     Social History   Socioeconomic History   Marital status: Married    Spouse name: Not on file   Number of children: Not on file   Years of education: Not on file   Highest education level: Not on file  Occupational History   Occupation: retired  Tobacco Use   Smoking status: Never   Smokeless tobacco: Never  Vaping Use   Vaping Use: Never used  Substance and Sexual Activity   Alcohol use: Not Currently   Drug use: Never   Sexual activity: Not Currently  Other Topics Concern   Not on file  Social History Narrative   Not on file   Social Determinants of Health   Financial Resource Strain: Low Risk    Difficulty of Paying Living Expenses: Not hard at all  Food Insecurity: No Food Insecurity   Worried About Charity fundraiser in the Last Year: Never true   Arboriculturist in the Last Year: Never true  Transportation Needs: No Transportation Needs   Lack of Transportation (Medical): No   Lack of Transportation (Non-Medical): No  Physical Activity: Inactive   Days of Exercise per Week: 0 days   Minutes of Exercise per Session: 0 min  Stress: No Stress Concern Present   Feeling of  Stress : Not at all  Social Connections: Not on file  Intimate Partner Violence: Not on file    Outpatient Medications Prior to Visit  Medication Sig Dispense Refill   acetaminophen (TYLENOL) 500 MG tablet Take 500 mg by mouth in the morning and at bedtime.     aspirin 81 MG tablet Take 81 mg by mouth daily.     Cholecalciferol (VITAMIN D3) 125 MCG (5000 UT) TABS Take 5,000 Units by mouth daily.     Glucosamine HCl 1500 MG TABS Take 1,500 mg by mouth in the morning and at bedtime.     lovastatin (MEVACOR) 10 MG tablet Take 1 tablet (10 mg total) by mouth daily. 90 tablet 1   Menthol, Topical Analgesic, (ICY HOT EX) Apply 1 application topically daily as needed (pain).     metFORMIN (GLUCOPHAGE-XR) 500 MG 24 hr tablet Take 1 tablet (500 mg total) by mouth 2 (two) times  daily. 180 tablet 1   Omega-3 Fatty Acids (FISH OIL ULTRA) 1400 MG CAPS Take 1,400 mg by mouth daily.     spironolactone (ALDACTONE) 50 MG tablet Take 1 tablet (50 mg total) by mouth daily. 90 tablet 1   losartan (COZAAR) 50 MG tablet Take 1 tablet (50 mg total) by mouth daily. 90 tablet 1   ibuprofen (ADVIL) 800 MG tablet Take 1 tablet (800 mg total) by mouth every 8 (eight) hours as needed. (Patient not taking: No sig reported) 30 tablet 0   letrozole (FEMARA) 2.5 MG tablet Take 1 tablet (2.5 mg total) by mouth daily. (Patient not taking: Reported on 01/30/2021) 30 tablet 0   sulfamethoxazole-trimethoprim (BACTRIM DS) 800-160 MG tablet Take 1 tablet by mouth 2 (two) times daily. (Patient not taking: No sig reported) 14 tablet 0   No facility-administered medications prior to visit.    Allergies  Allergen Reactions   Other Anaphylaxis    Honey Dew Melon   Wound Dressing Adhesive Rash   Morphine And Related Other (See Comments)    Hypotension   Latex Rash    ROS Review of Systems  Constitutional:  Positive for fatigue.  HENT: Negative.    Respiratory: Negative.    Cardiovascular:  Positive for leg swelling. Negative for chest pain.  Gastrointestinal: Negative.   Genitourinary: Negative.   Skin:  Positive for rash (to left breast from radiation).  Neurological: Negative.      Objective:    Physical Exam Vitals and nursing note reviewed.  Constitutional:      General: She is not in acute distress.    Appearance: Normal appearance. She is obese.  HENT:     Head: Normocephalic and atraumatic.  Eyes:     Conjunctiva/sclera: Conjunctivae normal.  Cardiovascular:     Rate and Rhythm: Normal rate and regular rhythm.     Pulses: Normal pulses.     Heart sounds: Normal heart sounds.  Pulmonary:     Effort: Pulmonary effort is normal.     Breath sounds: Rhonchi (Left lower lobe) present.  Musculoskeletal:     Cervical back: Normal range of motion.     Right lower leg: Edema  (non-pitting) present.     Left lower leg: Edema (2+ pitting) present.  Skin:    General: Skin is warm and dry.  Neurological:     General: No focal deficit present.     Mental Status: She is alert and oriented to person, place, and time.  Psychiatric:        Mood and Affect:  Mood normal.        Behavior: Behavior normal.        Thought Content: Thought content normal.        Judgment: Judgment normal.    BP 115/78   Pulse 75   Temp 98.5 F (36.9 C) (Oral)   Ht 5' 2.99" (1.6 m)   Wt (!) 368 lb (166.9 kg)   SpO2 92%   BMI 65.20 kg/m  Wt Readings from Last 3 Encounters:  01/30/21 (!) 368 lb (166.9 kg)  01/19/21 (!) 364 lb 8 oz (165.3 kg)  12/22/20 (!) 363 lb 4.8 oz (164.8 kg)     There are no preventive care reminders to display for this patient.   There are no preventive care reminders to display for this patient.  Lab Results  Component Value Date   TSH 1.660 02/21/2020   Lab Results  Component Value Date   WBC 7.9 01/26/2021   HGB 15.4 (H) 01/26/2021   HCT 47.0 (H) 01/26/2021   MCV 92.3 01/26/2021   PLT 217 01/26/2021   Lab Results  Component Value Date   NA 138 12/09/2020   K 4.5 12/09/2020   CO2 29 12/09/2020   GLUCOSE 191 (H) 12/09/2020   BUN 25 (H) 12/09/2020   CREATININE 0.94 12/09/2020   BILITOT 0.8 12/09/2020   ALKPHOS 50 12/09/2020   AST 19 12/09/2020   ALT 22 12/09/2020   PROT 7.8 12/09/2020   ALBUMIN 3.9 12/09/2020   CALCIUM 9.3 12/09/2020   ANIONGAP 10 12/09/2020   EGFR 79 08/22/2020   Lab Results  Component Value Date   CHOL 163 08/22/2020   Lab Results  Component Value Date   HDL 43 08/22/2020   Lab Results  Component Value Date   LDLCALC 90 08/22/2020   Lab Results  Component Value Date   TRIG 175 (H) 08/22/2020   No results found for: CHOLHDL Lab Results  Component Value Date   HGBA1C 6.7 12/04/2020      Assessment & Plan:   Problem List Items Addressed This Visit       Cardiovascular and Mediastinum    Hypertension associated with diabetes ([place]) - Primary    Blood pressure check on repeat was 123/71 with office machine while it was 146/79 on her home machine. Home machine may not be accurate anymore and recommended she buy a new blood pressure machine and can recheck validity at next office visit. With swelling, will add on HCTZ 12.83m daily. Continue monitoring blood pressure daily at home and follow up in 4 weeks.       Relevant Medications   losartan-hydrochlorothiazide (HYZAAR) 50-12.5 MG tablet     Other   Edema    Chronic, worsening. She states her oncologist said the swelling to her left arm and leg are not related to her breast cancer. Previous labs from September 2022 reviewed. Will add HCTZ 12.537mdaily to help with swelling. Encouraged her to elevate her legs when possible. She denies pain to legs, and no redness, low suspicion for DVT. Limit the amount of salt in her diet. Follow up in 4 weeks.        Meds ordered this encounter  Medications   losartan-hydrochlorothiazide (HYZAAR) 50-12.5 MG tablet    Sig: Take 1 tablet by mouth daily.    Dispense:  90 tablet    Refill:  0     Follow-up: Return in about 4 weeks (around 02/27/2021) for blood pressure and swelling.    Rowen Wilmer A  Dwan Bolt, NP

## 2021-01-30 ENCOUNTER — Ambulatory Visit
Admission: RE | Admit: 2021-01-30 | Discharge: 2021-01-30 | Disposition: A | Discharge status: Home / Self Care | Payer: Medicare Other | Source: Ambulatory Visit | Attending: Radiation Oncology | Admitting: Radiation Oncology

## 2021-01-30 ENCOUNTER — Other Ambulatory Visit: Payer: Self-pay

## 2021-01-30 ENCOUNTER — Ambulatory Visit (INDEPENDENT_AMBULATORY_CARE_PROVIDER_SITE_OTHER): Payer: Medicare Other | Admitting: Nurse Practitioner

## 2021-01-30 ENCOUNTER — Encounter: Payer: Self-pay | Admitting: Nurse Practitioner

## 2021-01-30 VITALS — BP 115/78 | HR 75 | Temp 98.5°F | Ht 62.99 in | Wt 368.0 lb

## 2021-01-30 DIAGNOSIS — E1159 Type 2 diabetes mellitus with other circulatory complications: Secondary | ICD-10-CM

## 2021-01-30 DIAGNOSIS — I152 Hypertension secondary to endocrine disorders: Secondary | ICD-10-CM | POA: Diagnosis not present

## 2021-01-30 DIAGNOSIS — R609 Edema, unspecified: Secondary | ICD-10-CM | POA: Diagnosis not present

## 2021-01-30 DIAGNOSIS — C50212 Malignant neoplasm of upper-inner quadrant of left female breast: Secondary | ICD-10-CM | POA: Diagnosis not present

## 2021-01-30 MED ORDER — LOSARTAN POTASSIUM-HCTZ 50-12.5 MG PO TABS: 1.0000 | ORAL_TABLET | Freq: Every day | ORAL | 0 refills | Status: DC

## 2021-01-30 NOTE — Assessment & Plan Note (Signed)
Chronic, worsening. She states her oncologist said the swelling to her left arm and leg are not related to her breast cancer. Previous labs from September 2022 reviewed. Will add HCTZ 12.5mg  daily to help with swelling. Encouraged her to elevate her legs when possible. She denies pain to legs, and no redness, low suspicion for DVT. Limit the amount of salt in her diet. Follow up in 4 weeks.

## 2021-01-30 NOTE — Assessment & Plan Note (Signed)
Blood pressure check on repeat was 123/71 with office machine while it was 146/79 on her home machine. Home machine may not be accurate anymore and recommended she buy a new blood pressure machine and can recheck validity at next office visit. With swelling, will add on HCTZ 12.5mg  daily. Continue monitoring blood pressure daily at home and follow up in 4 weeks.

## 2021-02-02 ENCOUNTER — Ambulatory Visit
Admission: RE | Admit: 2021-02-02 | Discharge: 2021-02-02 | Disposition: A | Discharge status: Home / Self Care | Payer: Medicare Other | Source: Ambulatory Visit | Attending: Radiation Oncology | Admitting: Radiation Oncology

## 2021-02-02 DIAGNOSIS — C50212 Malignant neoplasm of upper-inner quadrant of left female breast: Secondary | ICD-10-CM | POA: Diagnosis not present

## 2021-02-03 ENCOUNTER — Ambulatory Visit
Admission: RE | Admit: 2021-02-03 | Discharge: 2021-02-03 | Disposition: A | Discharge status: Home / Self Care | Payer: Medicare Other | Source: Ambulatory Visit | Attending: Radiation Oncology | Admitting: Radiation Oncology

## 2021-02-03 DIAGNOSIS — C50212 Malignant neoplasm of upper-inner quadrant of left female breast: Secondary | ICD-10-CM | POA: Insufficient documentation

## 2021-02-03 DIAGNOSIS — Z170 Estrogen receptor positive status [ER+]: Secondary | ICD-10-CM | POA: Insufficient documentation

## 2021-02-03 DIAGNOSIS — Z510 Encounter for antineoplastic radiation therapy: Secondary | ICD-10-CM | POA: Diagnosis present

## 2021-02-04 ENCOUNTER — Ambulatory Visit
Admission: RE | Admit: 2021-02-04 | Discharge: 2021-02-04 | Disposition: A | Discharge status: Home / Self Care | Payer: Medicare Other | Source: Ambulatory Visit | Attending: Radiation Oncology | Admitting: Radiation Oncology

## 2021-02-04 DIAGNOSIS — Z510 Encounter for antineoplastic radiation therapy: Secondary | ICD-10-CM | POA: Diagnosis not present

## 2021-02-05 ENCOUNTER — Ambulatory Visit
Admission: RE | Admit: 2021-02-05 | Discharge: 2021-02-05 | Disposition: A | Discharge status: Home / Self Care | Payer: Medicare Other | Source: Ambulatory Visit | Attending: Radiation Oncology | Admitting: Radiation Oncology

## 2021-02-05 DIAGNOSIS — Z510 Encounter for antineoplastic radiation therapy: Secondary | ICD-10-CM | POA: Diagnosis not present

## 2021-02-06 ENCOUNTER — Ambulatory Visit
Admission: RE | Admit: 2021-02-06 | Discharge: 2021-02-06 | Disposition: A | Discharge status: Home / Self Care | Payer: Medicare Other | Source: Ambulatory Visit | Attending: Radiation Oncology | Admitting: Radiation Oncology

## 2021-02-06 DIAGNOSIS — Z510 Encounter for antineoplastic radiation therapy: Secondary | ICD-10-CM | POA: Diagnosis not present

## 2021-02-09 ENCOUNTER — Inpatient Hospital Stay: Payer: Medicare Other | Attending: Radiation Oncology

## 2021-02-09 ENCOUNTER — Other Ambulatory Visit: Payer: Self-pay

## 2021-02-09 ENCOUNTER — Ambulatory Visit
Admission: RE | Admit: 2021-02-09 | Discharge: 2021-02-09 | Disposition: A | Discharge status: Home / Self Care | Payer: Medicare Other | Source: Ambulatory Visit | Attending: Radiation Oncology | Admitting: Radiation Oncology

## 2021-02-09 DIAGNOSIS — Z170 Estrogen receptor positive status [ER+]: Secondary | ICD-10-CM | POA: Insufficient documentation

## 2021-02-09 DIAGNOSIS — Z510 Encounter for antineoplastic radiation therapy: Secondary | ICD-10-CM | POA: Diagnosis not present

## 2021-02-09 DIAGNOSIS — C50212 Malignant neoplasm of upper-inner quadrant of left female breast: Secondary | ICD-10-CM | POA: Insufficient documentation

## 2021-02-09 LAB — CBC
HCT: 47.4 % — ABNORMAL HIGH (ref 36.0–46.0)
Hemoglobin: 15.7 g/dL — ABNORMAL HIGH (ref 12.0–15.0)
MCH: 30.3 pg (ref 26.0–34.0)
MCHC: 33.1 g/dL (ref 30.0–36.0)
MCV: 91.3 fL (ref 80.0–100.0)
Platelets: 204 10*3/uL (ref 150–400)
RBC: 5.19 MIL/uL — ABNORMAL HIGH (ref 3.87–5.11)
RDW: 13 % (ref 11.5–15.5)
WBC: 10.3 10*3/uL (ref 4.0–10.5)
nRBC: 0 % (ref 0.0–0.2)

## 2021-02-10 ENCOUNTER — Other Ambulatory Visit: Payer: Self-pay | Admitting: *Deleted

## 2021-02-10 ENCOUNTER — Ambulatory Visit
Admission: RE | Admit: 2021-02-10 | Discharge: 2021-02-10 | Disposition: A | Discharge status: Home / Self Care | Payer: Medicare Other | Source: Ambulatory Visit | Attending: Radiation Oncology | Admitting: Radiation Oncology

## 2021-02-10 ENCOUNTER — Other Ambulatory Visit: Payer: Self-pay | Admitting: Oncology

## 2021-02-10 DIAGNOSIS — Z510 Encounter for antineoplastic radiation therapy: Secondary | ICD-10-CM | POA: Diagnosis not present

## 2021-02-10 MED ORDER — STRATA XRT EX GEL: 1.0000 "application " | Freq: Two times a day (BID) | CUTANEOUS | 3 refills | Status: DC

## 2021-02-11 ENCOUNTER — Other Ambulatory Visit: Payer: Medicare Other

## 2021-02-11 ENCOUNTER — Ambulatory Visit
Admission: RE | Admit: 2021-02-11 | Discharge: 2021-02-11 | Disposition: A | Discharge status: Home / Self Care | Payer: Medicare Other | Source: Ambulatory Visit | Attending: Radiation Oncology | Admitting: Radiation Oncology

## 2021-02-11 DIAGNOSIS — Z510 Encounter for antineoplastic radiation therapy: Secondary | ICD-10-CM | POA: Diagnosis not present

## 2021-02-12 ENCOUNTER — Ambulatory Visit
Admission: RE | Admit: 2021-02-12 | Discharge: 2021-02-12 | Disposition: A | Discharge status: Home / Self Care | Payer: Medicare Other | Source: Ambulatory Visit | Attending: Radiation Oncology | Admitting: Radiation Oncology

## 2021-02-12 DIAGNOSIS — Z510 Encounter for antineoplastic radiation therapy: Secondary | ICD-10-CM | POA: Diagnosis not present

## 2021-02-25 ENCOUNTER — Ambulatory Visit
Admission: RE | Admit: 2021-02-25 | Discharge: 2021-02-25 | Disposition: A | Discharge status: Home / Self Care | Payer: Medicare Other | Source: Ambulatory Visit | Attending: Oncology | Admitting: Oncology

## 2021-02-25 ENCOUNTER — Other Ambulatory Visit: Payer: Self-pay

## 2021-02-25 DIAGNOSIS — C50212 Malignant neoplasm of upper-inner quadrant of left female breast: Secondary | ICD-10-CM | POA: Insufficient documentation

## 2021-02-25 DIAGNOSIS — Z780 Asymptomatic menopausal state: Secondary | ICD-10-CM | POA: Diagnosis present

## 2021-03-03 ENCOUNTER — Other Ambulatory Visit: Payer: Self-pay

## 2021-03-03 ENCOUNTER — Ambulatory Visit (INDEPENDENT_AMBULATORY_CARE_PROVIDER_SITE_OTHER): Payer: Medicare Other | Admitting: Family Medicine

## 2021-03-03 ENCOUNTER — Encounter: Payer: Self-pay | Admitting: Family Medicine

## 2021-03-03 VITALS — BP 131/84 | HR 84 | Temp 98.3°F

## 2021-03-03 DIAGNOSIS — E782 Mixed hyperlipidemia: Secondary | ICD-10-CM | POA: Diagnosis not present

## 2021-03-03 DIAGNOSIS — R609 Edema, unspecified: Secondary | ICD-10-CM | POA: Diagnosis not present

## 2021-03-03 DIAGNOSIS — E1159 Type 2 diabetes mellitus with other circulatory complications: Secondary | ICD-10-CM

## 2021-03-03 DIAGNOSIS — E119 Type 2 diabetes mellitus without complications: Secondary | ICD-10-CM

## 2021-03-03 DIAGNOSIS — B353 Tinea pedis: Secondary | ICD-10-CM

## 2021-03-03 DIAGNOSIS — I152 Hypertension secondary to endocrine disorders: Secondary | ICD-10-CM

## 2021-03-03 LAB — BAYER DCA HB A1C WAIVED: HB A1C (BAYER DCA - WAIVED): 7.1 % — ABNORMAL HIGH (ref 4.8–5.6)

## 2021-03-03 NOTE — Assessment & Plan Note (Signed)
Minorly better. Will consider increasing diuretic pending kidney function. Continue to monitor.

## 2021-03-03 NOTE — Progress Notes (Signed)
BP 131/84   Pulse 84   Temp 98.3 F (36.8 C)   SpO2 94%    Subjective:    Patient ID: Sharon Ware, female    DOB: 20-Oct-1950, 70 y.o.   MRN: [id number]  HPI: Sharon Ware is a 70 y.o. female  Chief Complaint  Patient presents with   Diabetes   Hypertension   Edema   foot concern    Patient states she has cracking under her toes, believes it  is athletes foot    DIABETES Hypoglycemic episodes:no Polydipsia/polyuria: no Visual disturbance: no Chest pain: no Paresthesias: no Glucose Monitoring: no  Accucheck frequency: Not Checking Taking Insulin?: no Blood Pressure Monitoring: a few times a week Retinal Examination: Up to Date Foot Exam: Up to Date Diabetic Education: Completed Pneumovax: Up to Date Influenza: Up to Date Aspirin: yes  HYPERTENSION / HYPERLIPIDEMIA Satisfied with current treatment? yes Duration of hypertension: chronic BP monitoring frequency: a few times a week BP medication side effects: no Past BP meds: spironalactone, HCTZ, losartan Duration of hyperlipidemia: chronic Cholesterol medication side effects: no Cholesterol supplements: none Past cholesterol medications: lovastatin Medication compliance: excellent compliance Aspirin: yes Recent stressors: no Recurrent headaches: no Visual changes: no Palpitations: no Dyspnea: no Chest pain: no Lower extremity edema: yes Dizzy/lightheaded: no  Has athletes foot between her 1st and 2nd toe on the R. Using lotramin. Has been improving.    Relevant past medical, surgical, family and social history reviewed and updated as indicated. Interim medical history since our last visit reviewed. Allergies and medications reviewed and updated.  Review of Systems  Constitutional: Negative.   Respiratory: Negative.    Cardiovascular: Negative.   Gastrointestinal: Negative.   Musculoskeletal: Negative.   Neurological: Negative.   Psychiatric/Behavioral: Negative.     Per HPI unless  specifically indicated above     Objective:    BP 131/84   Pulse 84   Temp 98.3 F (36.8 C)   SpO2 94%   Wt Readings from Last 3 Encounters:  01/30/21 (!) 368 lb (166.9 kg)  01/19/21 (!) 364 lb 8 oz (165.3 kg)  12/22/20 (!) 363 lb 4.8 oz (164.8 kg)    Physical Exam Vitals and nursing note reviewed.  Constitutional:      General: She is not in acute distress.    Appearance: Normal appearance. She is obese. She is not ill-appearing, toxic-appearing or diaphoretic.  HENT:     Head: Normocephalic and atraumatic.     Right Ear: External ear normal.     Left Ear: External ear normal.     Nose: Nose normal.     Mouth/Throat:     Mouth: Mucous membranes are moist.     Pharynx: Oropharynx is clear.  Eyes:     General: No scleral icterus.       Right eye: No discharge.        Left eye: No discharge.     Extraocular Movements: Extraocular movements intact.     Conjunctiva/sclera: Conjunctivae normal.     Pupils: Pupils are equal, round, and reactive to light.  Cardiovascular:     Rate and Rhythm: Normal rate and regular rhythm.     Pulses: Normal pulses.     Heart sounds: Normal heart sounds. No murmur heard.   No friction rub. No gallop.  Pulmonary:     Effort: Pulmonary effort is normal. No respiratory distress.     Breath sounds: Normal breath sounds. No stridor. No wheezing, rhonchi or rales.  Chest:     Chest wall: No tenderness.  Musculoskeletal:        General: Normal range of motion.     Cervical back: Normal range of motion and neck supple.     Right lower leg: Edema present.     Left lower leg: Edema present.  Skin:    General: Skin is warm and dry.     Capillary Refill: Capillary refill takes less than 2 seconds.     Coloration: Skin is not jaundiced or pale.     Findings: No bruising, erythema, lesion or rash.  Neurological:     General: No focal deficit present.     Mental Status: She is alert and oriented to person, place, and time. Mental status is at  baseline.  Psychiatric:        Mood and Affect: Mood normal.        Behavior: Behavior normal.        Thought Content: Thought content normal.        Judgment: Judgment normal.    Results for orders placed or performed in visit on 03/03/21  Bayer DCA Hb A1c Waived  Result Value Ref Range   HB A1C (BAYER DCA - WAIVED) 7.1 (H) 4.8 - 5.6 %      Assessment & Plan:   Problem List Items Addressed This Visit       Cardiovascular and Mediastinum   Hypertension associated with diabetes ([place])    Under good control on current regimen. Continue current regimen. Continue to monitor. Call with any concerns. Refills up to date. Checking labs today. May increase diuretic pending labs.          Endocrine   Diabetes mellitus without complication ([place]) - Primary    Doing well with a1c of 7.1. Continue current regimen. Continue to monitor. Call with any concerns.       Relevant Orders   Bayer DCA Hb A1c Waived (Completed)   Comprehensive metabolic panel   Lipid Panel w/o Chol/HDL Ratio     Other   Hyperlipidemia    Under good control on current regimen. Continue current regimen. Continue to monitor. Call with any concerns. Refills up to date. Labs drawn today.       Edema    Minorly better. Will consider increasing diuretic pending kidney function. Continue to monitor.       Other Visit Diagnoses     Tinea pedis of left foot       Continue lotramin. Call if not getting better or getting worse.         Follow up plan: Return in about 3 months (around 06/02/2021).

## 2021-03-03 NOTE — Assessment & Plan Note (Signed)
Under good control on current regimen. Continue current regimen. Continue to monitor. Call with any concerns. Refills up to date. Checking labs today. May increase diuretic pending labs.

## 2021-03-03 NOTE — Assessment & Plan Note (Signed)
Under good control on current regimen. Continue current regimen. Continue to monitor. Call with any concerns. Refills up to date. Labs drawn today.  

## 2021-03-03 NOTE — Assessment & Plan Note (Signed)
Doing well with a1c of 7.1. Continue current regimen. Continue to monitor. Call with any concerns.

## 2021-03-04 LAB — LIPID PANEL W/O CHOL/HDL RATIO
Cholesterol, Total: 203 mg/dL — ABNORMAL HIGH (ref 100–199)
HDL: 48 mg/dL (ref >39)
LDL Chol Calc (NIH): 121 mg/dL — ABNORMAL HIGH (ref 0–99)
Triglycerides: 193 mg/dL — ABNORMAL HIGH (ref 0–149)
VLDL Cholesterol Cal: 34 mg/dL (ref 5–40)

## 2021-03-04 LAB — COMPREHENSIVE METABOLIC PANEL
ALT: 25 IU/L (ref 0–32)
AST: 18 IU/L (ref 0–40)
Albumin/Globulin Ratio: 1.4 (ref 1.2–2.2)
Albumin: 4.1 g/dL (ref 3.7–4.7)
Alkaline Phosphatase: 62 IU/L (ref 44–121)
BUN/Creatinine Ratio: 29 — ABNORMAL HIGH (ref 12–28)
BUN: 27 mg/dL (ref 8–27)
Bilirubin Total: 0.5 mg/dL (ref 0.0–1.2)
CO2: 23 mmol/L (ref 20–29)
Calcium: 9.7 mg/dL (ref 8.7–10.3)
Chloride: 97 mmol/L (ref 96–106)
Creatinine, Ser: 0.92 mg/dL (ref 0.57–1.00)
Globulin, Total: 2.9 g/dL (ref 1.5–4.5)
Glucose: 162 mg/dL — ABNORMAL HIGH (ref 70–99)
Potassium: 4.4 mmol/L (ref 3.5–5.2)
Sodium: 138 mmol/L (ref 134–144)
Total Protein: 7 g/dL (ref 6.0–8.5)
eGFR: 66 mL/min/{1.73_m2} (ref >59)

## 2021-03-11 ENCOUNTER — Other Ambulatory Visit: Payer: Self-pay | Admitting: Oncology

## 2021-03-16 ENCOUNTER — Telehealth: Payer: Self-pay | Admitting: *Deleted

## 2021-03-16 NOTE — Telephone Encounter (Signed)
I don't see any reason why she would not be able to.   Faythe Casa, NP 03/16/2021 12:48 PM

## 2021-03-16 NOTE — Telephone Encounter (Signed)
Patient called reporting that she completed her radiation therapy a month ago and is asking if she should get the Herbalist. Please advise

## 2021-03-16 NOTE — Telephone Encounter (Signed)
Call returned to patient and advised of NP response. She thanked me for calling her and letting her know

## 2021-03-20 ENCOUNTER — Encounter: Payer: Self-pay | Admitting: Radiation Oncology

## 2021-03-20 ENCOUNTER — Other Ambulatory Visit: Payer: Self-pay

## 2021-03-20 ENCOUNTER — Ambulatory Visit
Admission: RE | Admit: 2021-03-20 | Discharge: 2021-03-20 | Disposition: A | Discharge status: Home / Self Care | Payer: Medicare Other | Source: Ambulatory Visit | Attending: Radiation Oncology | Admitting: Radiation Oncology

## 2021-03-20 VITALS — BP 133/82 | HR 77 | Temp 97.6°F | Resp 16

## 2021-03-20 DIAGNOSIS — Z170 Estrogen receptor positive status [ER+]: Secondary | ICD-10-CM | POA: Diagnosis not present

## 2021-03-20 DIAGNOSIS — Z923 Personal history of irradiation: Secondary | ICD-10-CM | POA: Diagnosis not present

## 2021-03-20 DIAGNOSIS — Z79811 Long term (current) use of aromatase inhibitors: Secondary | ICD-10-CM | POA: Insufficient documentation

## 2021-03-20 DIAGNOSIS — C50212 Malignant neoplasm of upper-inner quadrant of left female breast: Secondary | ICD-10-CM | POA: Insufficient documentation

## 2021-03-20 NOTE — Progress Notes (Signed)
Radiation Oncology Follow up Note  Name: Sharon Ware   Date:   03/20/2021 MRN:  [id number] DOB: 1951-03-03    This 70 y.o. 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  REFERRING PROVIDER: Valerie Roys, DO  HPI: Patient is a 70 year old female now at 1 month having completed whole breast radiation to her left breast for ER/PR positive tubular carcinoma.  Seen today in routine follow-up she is doing well she claims she has some slight swelling in her left axilla.  Otherwise she is without complaint.  She has been started on.  Letrozole tolerating that well without side effect.  COMPLICATIONS OF TREATMENT: none  FOLLOW UP COMPLIANCE: keeps appointments   PHYSICAL EXAM:  BP 133/82 (BP Location: Right Arm, Patient Position: Sitting)    Pulse 77    Temp 97.6 F (36.4 C) (Tympanic)    Resp 16  No area of palpable concern in the left axilla.  She still has some residual minor skin changes in her left breast.  No dominant masses noted in either breast.  No axillary or supraclavicular adenopathy is appreciated.  Well-developed well-nourished patient in NAD. HEENT reveals PERLA, EOMI, discs not visualized.  Oral cavity is clear. No oral mucosal lesions are identified. Neck is clear without evidence of cervical or supraclavicular adenopathy. Lungs are clear to A&P. Cardiac examination is essentially unremarkable with regular rate and rhythm without murmur rub or thrill. Abdomen is benign with no organomegaly or masses noted. Motor sensory and DTR levels are equal and symmetric in the upper and lower extremities. Cranial nerves II through XII are grossly intact. Proprioception is intact. No peripheral adenopathy or edema is identified. No motor or sensory levels are noted. Crude visual fields are  within normal range.  RADIOLOGY RESULTS: No current films for review  PLAN: Present time patient is doing well 1 month out from whole breast radiation.  On pleased with her overall progress.  I have asked to see her back in 4 to 5 months for follow-up.  She continues on letrozole without side effect.  Patient knows to call with any concerns.  I would like to take this opportunity to thank you for allowing me to participate in the care of your patient.Sharon Filbert, MD

## 2021-03-23 ENCOUNTER — Telehealth: Payer: Self-pay | Admitting: Family Medicine

## 2021-03-23 NOTE — Telephone Encounter (Signed)
Pt is calling to notify Dr. Wynetta Emery that she had her last booster on 03/16/2021 at [facility].

## 2021-03-23 NOTE — Telephone Encounter (Signed)
Documented

## 2021-03-23 NOTE — Telephone Encounter (Deleted)
Pt is calling

## 2021-04-02 ENCOUNTER — Other Ambulatory Visit: Payer: Self-pay | Admitting: Oncology

## 2021-04-07 ENCOUNTER — Other Ambulatory Visit: Payer: Self-pay | Admitting: Oncology

## 2021-04-09 ENCOUNTER — Other Ambulatory Visit: Payer: Self-pay | Admitting: Radiation Oncology

## 2021-04-13 ENCOUNTER — Other Ambulatory Visit: Payer: Self-pay

## 2021-04-13 ENCOUNTER — Encounter: Payer: Self-pay | Admitting: Internal Medicine

## 2021-04-13 ENCOUNTER — Ambulatory Visit (INDEPENDENT_AMBULATORY_CARE_PROVIDER_SITE_OTHER): Payer: Medicare Other | Admitting: Internal Medicine

## 2021-04-13 VITALS — BP 140/71 | HR 81 | Temp 98.1°F

## 2021-04-13 DIAGNOSIS — R350 Frequency of micturition: Secondary | ICD-10-CM

## 2021-04-13 DIAGNOSIS — R309 Painful micturition, unspecified: Secondary | ICD-10-CM | POA: Diagnosis not present

## 2021-04-13 LAB — URINALYSIS, ROUTINE W REFLEX MICROSCOPIC
Bilirubin, UA: NEGATIVE
Glucose, UA: NEGATIVE
Ketones, UA: NEGATIVE
Nitrite, UA: POSITIVE — AB
Specific Gravity, UA: 1.02 (ref 1.005–1.030)
Urobilinogen, Ur: 0.2 mg/dL (ref 0.2–1.0)
pH, UA: 5.5 (ref 5.0–7.5)

## 2021-04-13 LAB — MICROSCOPIC EXAMINATION

## 2021-04-13 MED ORDER — NITROFURANTOIN MONOHYD MACRO 100 MG PO CAPS: 100.0000 mg | ORAL_CAPSULE | Freq: Two times a day (BID) | ORAL | 0 refills | Status: AC

## 2021-04-13 NOTE — Progress Notes (Signed)
BP 140/71    Pulse 81    Temp 98.1 F (36.7 C) (Oral)    Subjective:    Patient ID: Sharon Ware, female    DOB: 1950/10/11, 71 y.o.   MRN: [id number]  Chief Complaint  Patient presents with   Urinary Frequency   Dysuria    HPI: Sharon Ware is a 71 y.o. female  Cloudy urine, no fever or chills no flank pain denies abdominal pain . Has a ho recurrent UTIs  Dysuria  This is a new problem. The quality of the pain is described as burning. The pain is mild. There has been no fever. Pertinent negatives include no chills, discharge, flank pain, hematuria, hesitancy or nausea. She has tried antibiotics for the symptoms. There is no history of recurrent UTIs.   Chief Complaint  Patient presents with   Urinary Frequency   Dysuria    Relevant past medical, surgical, family and social history reviewed and updated as indicated. Interim medical history since our last visit reviewed. Allergies and medications reviewed and updated.  Review of Systems  Constitutional:  Negative for chills.  Gastrointestinal:  Negative for nausea.  Genitourinary:  Negative for flank pain, hematuria and hesitancy.   Per HPI unless specifically indicated above     Objective:    BP 140/71    Pulse 81    Temp 98.1 F (36.7 C) (Oral)   Wt Readings from Last 3 Encounters:  01/30/21 (!) 368 lb (166.9 kg)  01/19/21 (!) 364 lb 8 oz (165.3 kg)  12/22/20 (!) 363 lb 4.8 oz (164.8 kg)    Physical Exam Vitals and nursing note reviewed.  Constitutional:      General: She is not in acute distress.    Appearance: Normal appearance. She is not ill-appearing or diaphoretic.  Eyes:     Conjunctiva/sclera: Conjunctivae normal.  Pulmonary:     Breath sounds: No rhonchi.  Abdominal:     Palpations: Abdomen is soft. There is no mass.     Tenderness: There is no abdominal tenderness. There is no right CVA tenderness, left CVA tenderness, guarding or rebound.     Hernia: No hernia is present.  Skin:     General: Skin is warm and dry.     Coloration: Skin is not jaundiced.     Findings: No erythema.  Neurological:     Mental Status: She is alert.    Results for orders placed or performed in visit on 03/03/21  Bayer DCA Hb A1c Waived  Result Value Ref Range   HB A1C (BAYER DCA - WAIVED) 7.1 (H) 4.8 - 5.6 %  Comprehensive metabolic panel  Result Value Ref Range   Glucose 162 (H) 70 - 99 mg/dL   BUN 27 8 - 27 mg/dL   Creatinine, Ser 0.92 0.57 - 1.00 mg/dL   eGFR 66 >59 mL/min/1.73   BUN/Creatinine Ratio 29 (H) 12 - 28   Sodium 138 134 - 144 mmol/L   Potassium 4.4 3.5 - 5.2 mmol/L   Chloride 97 96 - 106 mmol/L   CO2 23 20 - 29 mmol/L   Calcium 9.7 8.7 - 10.3 mg/dL   Total Protein 7.0 6.0 - 8.5 g/dL   Albumin 4.1 3.7 - 4.7 g/dL   Globulin, Total 2.9 1.5 - 4.5 g/dL   Albumin/Globulin Ratio 1.4 1.2 - 2.2   Bilirubin Total 0.5 0.0 - 1.2 mg/dL   Alkaline Phosphatase 62 44 - 121 IU/L   AST 18 0 - 40  IU/L   ALT 25 0 - 32 IU/L  Lipid Panel w/o Chol/HDL Ratio  Result Value Ref Range   Cholesterol, Total 203 (H) 100 - 199 mg/dL   Triglycerides 193 (H) 0 - 149 mg/dL   HDL 48 >39 mg/dL   VLDL Cholesterol Cal 34 5 - 40 mg/dL   LDL Chol Calc (NIH) 121 (H) 0 - 99 mg/dL        Current Outpatient Medications:    acetaminophen (TYLENOL) 500 MG tablet, Take 500 mg by mouth in the morning and at bedtime., Disp: , Rfl:    aspirin 81 MG tablet, Take 81 mg by mouth daily., Disp: , Rfl:    Cholecalciferol (VITAMIN D3) 125 MCG (5000 UT) TABS, Take 5,000 Units by mouth daily., Disp: , Rfl:    Dermatological Products, Misc. (STRATA XRT) GEL, Apply 1 application topically 2 (two) times daily., Disp: 50 g, Rfl: 3   Glucosamine HCl 1500 MG TABS, Take 1,500 mg by mouth in the morning and at bedtime., Disp: , Rfl:    letrozole (FEMARA) 2.5 MG tablet, TAKE 1 TABLET BY MOUTH EVERY DAY, Disp: 30 tablet, Rfl: 0   losartan-hydrochlorothiazide (HYZAAR) 50-12.5 MG tablet, Take 1 tablet by mouth daily., Disp:  90 tablet, Rfl: 0   lovastatin (MEVACOR) 10 MG tablet, Take 1 tablet (10 mg total) by mouth daily., Disp: 90 tablet, Rfl: 1   Menthol, Topical Analgesic, (ICY HOT EX), Apply 1 application topically daily as needed (pain)., Disp: , Rfl:    metFORMIN (GLUCOPHAGE-XR) 500 MG 24 hr tablet, Take 1 tablet (500 mg total) by mouth 2 (two) times daily., Disp: 180 tablet, Rfl: 1   Omega-3 Fatty Acids (FISH OIL ULTRA) 1400 MG CAPS, Take 1,400 mg by mouth daily., Disp: , Rfl:    spironolactone (ALDACTONE) 50 MG tablet, Take 1 tablet (50 mg total) by mouth daily., Disp: 90 tablet, Rfl: 1    Assessment & Plan:   Problem List Items Addressed This Visit   None Visit Diagnoses     Frequency of urination    -  Primary   Relevant Orders   Urine Culture   Urinalysis, Routine w reflex microscopic   Pain with urination       Relevant Orders   Urine Culture   Urinalysis, Routine w reflex microscopic        Orders Placed This Encounter  Procedures   Urine Culture   Urinalysis, Routine w reflex microscopic     No orders of the defined types were placed in this encounter.    Follow up plan: No follow-ups on file.

## 2021-04-15 ENCOUNTER — Other Ambulatory Visit: Payer: Self-pay

## 2021-04-15 ENCOUNTER — Inpatient Hospital Stay: Payer: Medicare Other | Attending: Oncology

## 2021-04-15 ENCOUNTER — Inpatient Hospital Stay (HOSPITAL_BASED_OUTPATIENT_CLINIC_OR_DEPARTMENT_OTHER): Payer: Medicare Other | Admitting: Oncology

## 2021-04-15 ENCOUNTER — Encounter: Payer: Self-pay | Admitting: Oncology

## 2021-04-15 VITALS — BP 115/84 | HR 88 | Temp 98.9°F | Resp 20 | Ht 62.0 in | Wt 359.0 lb

## 2021-04-15 DIAGNOSIS — Z08 Encounter for follow-up examination after completed treatment for malignant neoplasm: Secondary | ICD-10-CM

## 2021-04-15 DIAGNOSIS — Z79811 Long term (current) use of aromatase inhibitors: Secondary | ICD-10-CM | POA: Diagnosis not present

## 2021-04-15 DIAGNOSIS — C50912 Malignant neoplasm of unspecified site of left female breast: Secondary | ICD-10-CM | POA: Diagnosis not present

## 2021-04-15 DIAGNOSIS — Z79899 Other long term (current) drug therapy: Secondary | ICD-10-CM | POA: Diagnosis not present

## 2021-04-15 DIAGNOSIS — C50212 Malignant neoplasm of upper-inner quadrant of left female breast: Secondary | ICD-10-CM

## 2021-04-15 DIAGNOSIS — Z170 Estrogen receptor positive status [ER+]: Secondary | ICD-10-CM | POA: Diagnosis not present

## 2021-04-15 DIAGNOSIS — Z853 Personal history of malignant neoplasm of breast: Secondary | ICD-10-CM | POA: Diagnosis not present

## 2021-04-15 DIAGNOSIS — C773 Secondary and unspecified malignant neoplasm of axilla and upper limb lymph nodes: Secondary | ICD-10-CM | POA: Insufficient documentation

## 2021-04-15 LAB — COMPREHENSIVE METABOLIC PANEL
ALT: 26 U/L (ref 0–44)
AST: 25 U/L (ref 15–41)
Albumin: 4.1 g/dL (ref 3.5–5.0)
Alkaline Phosphatase: 55 U/L (ref 38–126)
Anion gap: 10 (ref 5–15)
BUN: 32 mg/dL — ABNORMAL HIGH (ref 8–23)
CO2: 28 mmol/L (ref 22–32)
Calcium: 9.3 mg/dL (ref 8.9–10.3)
Chloride: 95 mmol/L — ABNORMAL LOW (ref 98–111)
Creatinine, Ser: 0.9 mg/dL (ref 0.44–1.00)
GFR, Estimated: >60 mL/min (ref >60)
Glucose, Bld: 193 mg/dL — ABNORMAL HIGH (ref 70–99)
Potassium: 4.3 mmol/L (ref 3.5–5.1)
Sodium: 133 mmol/L — ABNORMAL LOW (ref 135–145)
Total Bilirubin: 0.8 mg/dL (ref 0.3–1.2)
Total Protein: 7.9 g/dL (ref 6.5–8.1)

## 2021-04-15 NOTE — Progress Notes (Signed)
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  Ultrasound showed a 1.2 x 0.8 x 0.7 cm mass 8 cm from the nipple at the 10:30 position.  No suspicious left axillary adenopathy.  This was biopsied and was consistent with invasive mammary carcinoma with tubular features grade 1.  No lymphovascular invasion identified.  ER/PR greater than 90% positive HER2 negative   Final pathology from lumpectomy showed tubular carcinoma grade 10 mm.  Multifocal superior margin positive.  Mutual decision between surgery radiation oncology and patient was to forego reexcision and proceed with adjuvant radiation treatment with radiation boost to the superior margin.  Patient completed adjuvant radiation treatment and started letrozole in November 2022.  Baseline bone density scan normal  Interval history-patient reports tolerating letrozole well without any significant side effects.  She is also taking her calcium and vitamin D regularly.  She did have some radiation dermatitis which is gradually  healing well.  ECOG PS- 2 Pain scale- 0   Review of systems- Review of Systems  Constitutional:  Negative for chills, fever, malaise/fatigue and weight loss.  HENT:  Negative for congestion, ear discharge and nosebleeds.   Eyes:  Negative for blurred vision.  Respiratory:  Negative for cough, hemoptysis, sputum production, shortness of breath and wheezing.   Cardiovascular:  Negative for chest pain, palpitations, orthopnea and claudication.  Gastrointestinal:  Negative for abdominal pain, blood in stool, constipation, diarrhea, heartburn, melena, nausea and vomiting.  Genitourinary:  Negative for dysuria, flank pain, frequency, hematuria and urgency.  Musculoskeletal:  Negative for back pain, joint pain and myalgias.  Skin:  Negative for rash.  Neurological:  Negative for dizziness, tingling, focal weakness, seizures, weakness and headaches.  Endo/Heme/Allergies:  Does not bruise/bleed easily.  Psychiatric/Behavioral:  Negative for depression and suicidal ideas. The patient does not have insomnia.      Allergies  Allergen Reactions   Other Anaphylaxis    Honey Dew Melon   Wound Dressing Adhesive Rash   Morphine And Related Other (See Comments)    Hypotension   Latex Rash     Past Medical History:  Diagnosis Date   Arthritis    Breast cancer ([place])    Cataract    Diabetes mellitus without complication ([place])    Hyperlipidemia    Hypertension    Vitamin D deficiency      Past Surgical History:  Procedure Laterality Date   ABDOMINAL HYSTERECTOMY     x2   BREAST BIOPSY Right 1990s   neg   BREAST BIOPSY Right 2000s   neg   BREAST BIOPSY Left 11/24/2020   u/s bx 10:30/8 cmfn-"heart" marker   BREAST EXCISIONAL BIOPSY Right    neg  BREAST LUMPECTOMY WITH RADIOFREQUENCY TAG IDENTIFICATION Left 09/06/4032   Procedure: BREAST LUMPECTOMY WITH RADIOFREQUENCY TAG IDENTIFICATION;  Surgeon: Ronny Bacon, MD;  Location: [facility] ORS;  Service: General;  Laterality: Left;    DILATION AND CURETTAGE OF UTERUS     EYE SURGERY  2004   cataract surgery and laser surgery   Salpingo-oophorectomy      Social History   Socioeconomic History   Marital status: Married    Spouse name: Not on file   Number of children: Not on file   Years of education: Not on file   Highest education level: Not on file  Occupational History   Occupation: retired  Tobacco Use   Smoking status: Never   Smokeless tobacco: Never  Vaping Use   Vaping Use: Never used  Substance and Sexual Activity   Alcohol use: Not Currently   Drug use: Never   Sexual activity: Not Currently  Other Topics Concern   Not on file  Social History Narrative   Not on file   Social Determinants of Health   Financial Resource Strain: Low Risk    Difficulty of Paying Living Expenses: Not hard at all  Food Insecurity: No Food Insecurity   Worried About Charity fundraiser in the Last Year: Never true   [place] in the Last Year: Never true  Transportation Needs: No Transportation Needs   Lack of Transportation (Medical): No   Lack of Transportation (Non-Medical): No  Physical Activity: Inactive   Days of Exercise per Week: 0 days   Minutes of Exercise per Session: 0 min  Stress: No Stress Concern Present   Feeling of Stress : Not at all  Social Connections: Not on file  Intimate Partner Violence: Not on file    Family History  Problem Relation Age of Onset   Breast cancer Mother 58   Heart disease Father    Cancer Paternal Grandfather    Heart disease Paternal Grandfather      Current Outpatient Medications:    acetaminophen (TYLENOL) 500 MG tablet, Take 500 mg by mouth in the morning and at bedtime., Disp: , Rfl:    aspirin 81 MG tablet, Take 81 mg by mouth daily., Disp: , Rfl:    Cholecalciferol (VITAMIN D3) 125 MCG (5000 UT) TABS, Take 5,000 Units by mouth daily., Disp: , Rfl:    Dermatological Products, Misc. (STRATA XRT) GEL, Apply 1 application topically 2 (two) times  daily., Disp: 50 g, Rfl: 3   Glucosamine HCl 1500 MG TABS, Take 1,500 mg by mouth in the morning and at bedtime., Disp: , Rfl:    letrozole (FEMARA) 2.5 MG tablet, TAKE 1 TABLET BY MOUTH EVERY DAY, Disp: 30 tablet, Rfl: 0   losartan-hydrochlorothiazide (HYZAAR) 50-12.5 MG tablet, Take 1 tablet by mouth daily., Disp: 90 tablet, Rfl: 0   lovastatin (MEVACOR) 10 MG tablet, Take 1 tablet (10 mg total) by mouth daily., Disp: 90 tablet, Rfl: 1   Menthol, Topical Analgesic, (ICY HOT EX), Apply 1 application topically daily as needed (pain)., Disp: , Rfl:    metFORMIN (GLUCOPHAGE-XR) 500 MG 24 hr tablet, Take 1 tablet (500 mg total) by mouth 2 (two) times daily., Disp: 180 tablet, Rfl: 1   nitrofurantoin, macrocrystal-monohydrate, (MACROBID) 100 MG capsule, Take 1 capsule (100 mg total) by mouth 2 (two) times daily for 7 days., Disp: 14 capsule, Rfl: 0   Omega-3 Fatty Acids (FISH OIL ULTRA) 1400 MG CAPS, Take 1,400 mg by mouth daily., Disp: , Rfl:  spironolactone (ALDACTONE) 50 MG tablet, Take 1 tablet (50 mg total) by mouth daily., Disp: 90 tablet, Rfl: 1  Physical exam:  Vitals:   04/15/21 1057  BP: 115/84  Pulse: 88  Resp: 20  Temp: 98.9 F (37.2 C)  TempSrc: Tympanic  SpO2: 96%  Weight: (!) 359 lb (162.8 kg)  Height: _0  (1.575 m)   Physical Exam Constitutional:      General: She is not in acute distress.    Appearance: She is obese.     Comments: Sitting in a wheelchair.  Appears in no acute distress  Cardiovascular:     Rate and Rhythm: Normal rate and regular rhythm.     Heart sounds: Normal heart sounds.  Pulmonary:     Effort: Pulmonary effort is normal.     Breath sounds: Normal breath sounds.  Skin:    General: Skin is warm and dry.  Neurological:     Mental Status: She is alert and oriented to person, place, and time.    Breast exam: Patient could not sit up on the examination table.  With limited exam performed in sitting position no palpable masses in either breast.   No palpable bilateral axillary adenopathy..  Changes of left lumpectomy seen.  CMP Latest Ref Rng & Units 04/15/2021  Glucose 70 - 99 mg/dL 193(H)  BUN 8 - 23 mg/dL 32(H)  Creatinine 0.44 - 1.00 mg/dL 0.90  Sodium 135 - 145 mmol/L 133(L)  Potassium 3.5 - 5.1 mmol/L 4.3  Chloride 98 - 111 mmol/L 95(L)  CO2 22 - 32 mmol/L 28  Calcium 8.9 - 10.3 mg/dL 9.3  Total Protein 6.5 - 8.1 g/dL 7.9  Total Bilirubin 0.3 - 1.2 mg/dL 0.8  Alkaline Phos 38 - 126 U/L 55  AST 15 - 41 U/L 25  ALT 0 - 44 U/L 26   CBC Latest Ref Rng & Units 02/09/2021  WBC 4.0 - 10.5 K/uL 10.3  Hemoglobin 12.0 - 15.0 g/dL 15.7(H)  Hematocrit 36.0 - 46.0 % 47.4(H)  Platelets 150 - 400 K/uL 204    Assessment and plan- Patient is a 71 y.o. 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  She is here for routine follow-up of breast cancer on letrozole  Clinically patient is doing well with no concerning signs and symptoms of recurrence based on today's exam.  Tolerating letrozole well without any significant side effects.  She is also taking her calcium and vitamin D.  She will be due for a repeat mammogram in August 2023 which I will schedule.   Visit Diagnosis 1. Use of letrozole (Femara)   2. Encounter for follow-up surveillance of breast cancer      Dr. Randa Evens, MD, MPH [facility] at [facility] [phone] 04/15/2021 4:34 PM

## 2021-04-15 NOTE — Progress Notes (Signed)
Survivorship Care Plan visit completed.  Treatment summary reviewed and given to patient.  ASCO answers booklet reviewed and given to patient.  CARE program and Cancer Transitions discussed with patient along with other resources cancer center offers to patients and caregivers.  Patient verbalized understanding.    

## 2021-04-16 LAB — URINE CULTURE

## 2021-04-16 MED ORDER — SULFAMETHOXAZOLE-TRIMETHOPRIM 800-160 MG PO TABS: 1.0000 | ORAL_TABLET | Freq: Two times a day (BID) | ORAL | 0 refills | Status: AC

## 2021-04-16 NOTE — Progress Notes (Signed)
Please let pt know culture shows organisms covered by nitrofurantoin if shes better great otherwise can send in an additional 3 days of such thnx.

## 2021-04-16 NOTE — Addendum Note (Signed)
Addended by: Charlynne Cousins on: 04/16/2021 02:01 PM   Modules accepted: Orders

## 2021-04-18 ENCOUNTER — Other Ambulatory Visit: Payer: Self-pay | Admitting: Nurse Practitioner

## 2021-04-19 NOTE — Telephone Encounter (Signed)
Requested Prescriptions  Pending Prescriptions Disp Refills   losartan-hydrochlorothiazide (HYZAAR) 50-12.5 MG tablet [Pharmacy Med Name: LOSARTAN-HCTZ 50-12.5 MG TAB] 90 tablet 0    Sig: TAKE 1 TABLET BY MOUTH EVERY DAY     Cardiovascular: ARB + Diuretic Combos Failed - 04/18/2021  2:44 PM      Failed - Na in normal range and within 180 days    Sodium  Date Value Ref Range Status  04/15/2021 133 (L) 135 - 145 mmol/L Final  03/03/2021 138 134 - 144 mmol/L Final         Passed - K in normal range and within 180 days    Potassium  Date Value Ref Range Status  04/15/2021 4.3 3.5 - 5.1 mmol/L Final         Passed - Cr in normal range and within 180 days    Creatinine, Ser  Date Value Ref Range Status  04/15/2021 0.90 0.44 - 1.00 mg/dL Final         Passed - Ca in normal range and within 180 days    Calcium  Date Value Ref Range Status  04/15/2021 9.3 8.9 - 10.3 mg/dL Final         Passed - Patient is not pregnant      Passed - Last BP in normal range    BP Readings from Last 1 Encounters:  04/15/21 115/84         Passed - Valid encounter within last 6 months    Recent Outpatient Visits          6 days ago Frequency of urination   [place] Vigg, Avanti, MD   1 month ago Diabetes mellitus without complication ([place])   [place], Megan P, DO   2 months ago Hypertension associated with diabetes ([place])   [place], Lauren A, NP   3 months ago Acute cystitis with hematuria   [place], Lauren A, NP   4 months ago Need for influenza vaccination   [facility] [place], Barb Merino, DO      Future Appointments            In 1 month Johnson, Barb Merino, DO [place], [place]   In 6 months  [employer], [facility]

## 2021-05-27 ENCOUNTER — Other Ambulatory Visit: Payer: Self-pay | Admitting: Oncology

## 2021-06-03 ENCOUNTER — Encounter: Payer: Self-pay | Admitting: Family Medicine

## 2021-06-03 ENCOUNTER — Ambulatory Visit (INDEPENDENT_AMBULATORY_CARE_PROVIDER_SITE_OTHER): Payer: Medicare Other | Admitting: Family Medicine

## 2021-06-03 ENCOUNTER — Other Ambulatory Visit: Payer: Self-pay

## 2021-06-03 VITALS — BP 113/73 | HR 71 | Temp 98.7°F | Wt 359.0 lb

## 2021-06-03 DIAGNOSIS — E782 Mixed hyperlipidemia: Secondary | ICD-10-CM | POA: Diagnosis not present

## 2021-06-03 DIAGNOSIS — E119 Type 2 diabetes mellitus without complications: Secondary | ICD-10-CM

## 2021-06-03 LAB — BAYER DCA HB A1C WAIVED: HB A1C (BAYER DCA - WAIVED): 7.3 % — ABNORMAL HIGH (ref 4.8–5.6)

## 2021-06-03 MED ORDER — LOVASTATIN 20 MG PO TABS: 20.0000 mg | ORAL_TABLET | Freq: Every day | ORAL | 1 refills | Status: DC

## 2021-06-03 MED ORDER — SPIRONOLACTONE 50 MG PO TABS: 50.0000 mg | ORAL_TABLET | Freq: Every day | ORAL | 1 refills | Status: DC

## 2021-06-03 MED ORDER — LOSARTAN POTASSIUM-HCTZ 50-12.5 MG PO TABS: 1.0000 | ORAL_TABLET | Freq: Every day | ORAL | 1 refills | Status: DC

## 2021-06-03 MED ORDER — METFORMIN HCL ER 500 MG PO TB24: ORAL_TABLET | ORAL | 1 refills | Status: DC

## 2021-06-03 NOTE — Assessment & Plan Note (Signed)
Stable with A1c of 7.3- will increase her metformin to 1 in the AM and 2 in the PM. Recheck 3 months. Call with any concerns.  ?

## 2021-06-03 NOTE — Progress Notes (Signed)
? ?BP 113/73   Pulse 71   Temp 98.7 ?F (37.1 ?C) (Oral)   Wt (!) 359 lb (162.8 kg)   SpO2 94%   BMI 65.66 kg/m?   ? ?Subjective:  ? ? Patient ID: Sharon Ware, female    DOB: 05/19/1950, 71 y.o.   MRN: [id number] ? ?HPI: ?Sharon Ware is a 71 y.o. female ? ?Chief Complaint  ?Patient presents with  ? Diabetes  ? ?DIABETES ?Hypoglycemic episodes:no ?Polydipsia/polyuria: no ?Visual disturbance: no ?Chest pain: no ?Paresthesias: no ?Glucose Monitoring: no ? Accucheck frequency: Not Checking ?Taking Insulin?: no ?Blood Pressure Monitoring: rarely ?Retinal Examination: Up to Date ?Foot Exam: Up to Date ?Diabetic Education: Completed ?Pneumovax: Up to Date ?Influenza: Up to Date ?Aspirin: yes ? ? ?Relevant past medical, surgical, family and social history reviewed and updated as indicated. Interim medical history since our last visit reviewed. ?Allergies and medications reviewed and updated. ? ?Review of Systems  ?Constitutional: Negative.   ?Respiratory: Negative.    ?Cardiovascular: Negative.   ?Gastrointestinal: Negative.   ?Musculoskeletal: Negative.   ?Psychiatric/Behavioral: Negative.    ? ?Per HPI unless specifically indicated above ? ?   ?Objective:  ?  ?BP 113/73   Pulse 71   Temp 98.7 ?F (37.1 ?C) (Oral)   Wt (!) 359 lb (162.8 kg)   SpO2 94%   BMI 65.66 kg/m?   ?Wt Readings from Last 3 Encounters:  ?06/03/21 (!) 359 lb (162.8 kg)  ?04/15/21 (!) 359 lb (162.8 kg)  ?01/30/21 (!) 368 lb (166.9 kg)  ?  ?Physical Exam ?Vitals and nursing note reviewed.  ?Constitutional:   ?   General: She is not in acute distress. ?   Appearance: Normal appearance. She is obese. She is not ill-appearing, toxic-appearing or diaphoretic.  ?HENT:  ?   Head: Normocephalic and atraumatic.  ?   Right Ear: External ear normal.  ?   Left Ear: External ear normal.  ?   Nose: Nose normal.  ?   Mouth/Throat:  ?   Mouth: Mucous membranes are moist.  ?   Pharynx: Oropharynx is clear.  ?Eyes:  ?   General: No scleral icterus.    ?    Right eye: No discharge.     ?   Left eye: No discharge.  ?   Extraocular Movements: Extraocular movements intact.  ?   Conjunctiva/sclera: Conjunctivae normal.  ?   Pupils: Pupils are equal, round, and reactive to light.  ?Cardiovascular:  ?   Rate and Rhythm: Normal rate and regular rhythm.  ?   Pulses: Normal pulses.  ?   Heart sounds: Normal heart sounds. No murmur heard. ?  No friction rub. No gallop.  ?Pulmonary:  ?   Effort: Pulmonary effort is normal. No respiratory distress.  ?   Breath sounds: Normal breath sounds. No stridor. No wheezing, rhonchi or rales.  ?Chest:  ?   Chest wall: No tenderness.  ?Musculoskeletal:     ?   General: Normal range of motion.  ?   Cervical back: Normal range of motion and neck supple.  ?Skin: ?   General: Skin is warm and dry.  ?   Capillary Refill: Capillary refill takes less than 2 seconds.  ?   Coloration: Skin is not jaundiced or pale.  ?   Findings: No bruising, erythema, lesion or rash.  ?Neurological:  ?   General: No focal deficit present.  ?   Mental Status: She is alert and oriented to person, place,  and time. Mental status is at baseline.  ?Psychiatric:     ?   Mood and Affect: Mood normal.     ?   Behavior: Behavior normal.     ?   Thought Content: Thought content normal.     ?   Judgment: Judgment normal.  ? ? ?Results for orders placed or performed in visit on 04/15/21  ?Comprehensive metabolic panel  ?Result Value Ref Range  ? Sodium 133 (L) 135 - 145 mmol/L  ? Potassium 4.3 3.5 - 5.1 mmol/L  ? Chloride 95 (L) 98 - 111 mmol/L  ? CO2 28 22 - 32 mmol/L  ? Glucose, Bld 193 (H) 70 - 99 mg/dL  ? BUN 32 (H) 8 - 23 mg/dL  ? Creatinine, Ser 0.90 0.44 - 1.00 mg/dL  ? Calcium 9.3 8.9 - 10.3 mg/dL  ? Total Protein 7.9 6.5 - 8.1 g/dL  ? Albumin 4.1 3.5 - 5.0 g/dL  ? AST 25 15 - 41 U/L  ? ALT 26 0 - 44 U/L  ? Alkaline Phosphatase 55 38 - 126 U/L  ? Total Bilirubin 0.8 0.3 - 1.2 mg/dL  ? GFR, Estimated >60 >60 mL/min  ? Anion gap 10 5 - 15  ? ?   ?Assessment & Plan:   ? ?Problem List Items Addressed This Visit   ? ?  ? Endocrine  ? Diabetes mellitus without complication ([place]) - Primary  ?  Stable with A1c of 7.3- will increase her metformin to 1 in the AM and 2 in the PM. Recheck 3 months. Call with any concerns.  ?  ?  ? Relevant Medications  ? metFORMIN (GLUCOPHAGE-XR) 500 MG 24 hr tablet  ? losartan-hydrochlorothiazide (HYZAAR) 50-12.5 MG tablet  ? lovastatin (MEVACOR) 20 MG tablet  ? Other Relevant Orders  ? Bayer DCA Hb A1c Waived  ?  ? Other  ? Hyperlipidemia  ?  Will increase her lovastatin to 20mg . Recheck 3 months. Call with any concerns.  ?  ?  ? Relevant Medications  ? losartan-hydrochlorothiazide (HYZAAR) 50-12.5 MG tablet  ? spironolactone (ALDACTONE) 50 MG tablet  ? lovastatin (MEVACOR) 20 MG tablet  ?  ? ?Follow up plan: ?Return in about 3 months (around 09/03/2021). ? ? ? ? ? ?

## 2021-06-03 NOTE — Assessment & Plan Note (Signed)
Will increase her lovastatin to 20mg . Recheck 3 months. Call with any concerns.  ?

## 2021-06-21 ENCOUNTER — Other Ambulatory Visit: Payer: Self-pay | Admitting: Oncology

## 2021-07-02 ENCOUNTER — Other Ambulatory Visit: Payer: Self-pay | Admitting: Family Medicine

## 2021-07-02 NOTE — Telephone Encounter (Signed)
Pt is now taking lovastatin 20 mg.. ?Metformin refilled 06/03/2021 #270 1 refill. ?Requested Prescriptions  ?Pending Prescriptions Disp Refills  ?? lovastatin (MEVACOR) 10 MG tablet [Pharmacy Med Name: LOVASTATIN 10 MG TABLET] 90 tablet 1  ?  Sig: TAKE 1 TABLET BY MOUTH EVERY DAY  ?  ? Cardiovascular:  Antilipid - Statins 2 Failed - 07/02/2021  1:58 AM  ?  ?  Failed - Lipid Panel in normal range within the last 12 months  ?  Cholesterol, Total  ?Date Value Ref Range Status  ?03/03/2021 203 (H) 100 - 199 mg/dL Final  ? ?LDL Chol Calc (NIH)  ?Date Value Ref Range Status  ?03/03/2021 121 (H) 0 - 99 mg/dL Final  ? ?HDL  ?Date Value Ref Range Status  ?03/03/2021 48 >39 mg/dL Final  ? ?Triglycerides  ?Date Value Ref Range Status  ?03/03/2021 193 (H) 0 - 149 mg/dL Final  ? ?  ?  ?  Passed - Cr in normal range and within 360 days  ?  Creatinine, Ser  ?Date Value Ref Range Status  ?04/15/2021 0.90 0.44 - 1.00 mg/dL Final  ?   ?  ?  Passed - Patient is not pregnant  ?  ?  Passed - Valid encounter within last 12 months  ?  Recent Outpatient Visits   ?      ? 4 weeks ago Diabetes mellitus without complication ([place])  ? [place] P, DO  ? 2 months ago Frequency of urination  ? [facility] Vigg, Avanti, MD  ? 4 months ago Diabetes mellitus without complication ([place])  ? [place], DO  ? 5 months ago Hypertension associated with diabetes ([place])  ? [place], NP  ? 6 months ago Acute cystitis with hematuria  ? [facility], Scheryl Darter, NP  ?  ?  ?Future Appointments   ?        ? In 2 months Wynetta Emery, Barb Merino, DO [place], [facility]  ? In 3 months  [place], [facility]  ?  ? ?  ?  ?  ?? metFORMIN (GLUCOPHAGE-XR) 500 MG 24 hr tablet [Pharmacy Med Name: METFORMIN HCL ER 500 MG TABLET] 180 tablet 1  ?  Sig: TAKE 1 TABLET BY MOUTH TWICE A DAY  ?  ? Endocrinology:  Diabetes - Biguanides Failed -  07/02/2021  1:58 AM  ?  ?  Failed - B12 Level in normal range and within 720 days  ?  No results found for: VITAMINB12   ?  ?  Passed - Cr in normal range and within 360 days  ?  Creatinine, Ser  ?Date Value Ref Range Status  ?04/15/2021 0.90 0.44 - 1.00 mg/dL Final  ?   ?  ?  Passed - HBA1C is between 0 and 7.9 and within 180 days  ?  HB A1C (BAYER DCA - WAIVED)  ?Date Value Ref Range Status  ?06/03/2021 7.3 (H) 4.8 - 5.6 % Final  ?  Comment:  ?           Prediabetes: 5.7 - 6.4 ?         Diabetes: >6.4 ?         Glycemic control for adults with diabetes: <7.0 ?  ?   ?  ?  Passed - eGFR in normal range and within 360 days  ?  GFR calc Af Amer  ?Date Value Ref  Range Status  ?02/21/2020 90 >59 mL/min/1.73 Final  ?  Comment:  ?  **In accordance with recommendations from the NKF-ASN Task force,** ?  [facility] is in the process of updating its eGFR calculation to the ?  2021 CKD-EPI creatinine equation that estimates kidney function ?  without a race variable. ?  ? ?GFR, Estimated  ?Date Value Ref Range Status  ?04/15/2021 >60 >60 mL/min Final  ?  Comment:  ?  (NOTE) ?Calculated using the CKD-EPI Creatinine Equation (2021) ?  ? ?eGFR  ?Date Value Ref Range Status  ?03/03/2021 66 >59 mL/min/1.73 Final  ?   ?  ?  Passed - Valid encounter within last 6 months  ?  Recent Outpatient Visits   ?      ? 4 weeks ago Diabetes mellitus without complication ([place])  ? [place] P, DO  ? 2 months ago Frequency of urination  ? [facility] Vigg, Avanti, MD  ? 4 months ago Diabetes mellitus without complication ([place])  ? [place], DO  ? 5 months ago Hypertension associated with diabetes ([place])  ? [place], NP  ? 6 months ago Acute cystitis with hematuria  ? [facility], Scheryl Darter, NP  ?  ?  ?Future Appointments   ?        ? In 2 months Wynetta Emery, Barb Merino, DO [place], [facility]  ? In 3 months  [place], [facility]  ?  ? ?  ?  ?  Passed - CBC within normal limits and completed in the last 12 months  ?  WBC  ?Date Value Ref Range Status  ?02/09/2021 10.3 4.0 - 10.5 K/uL Final  ? ?RBC  ?Date Value Ref Range Status  ?02/09/2021 5.19 (H) 3.87 - 5.11 MIL/uL Final  ? ?Hemoglobin  ?Date Value Ref Range Status  ?02/09/2021 15.7 (H) 12.0 - 15.0 g/dL Final  ?08/22/2020 15.4 11.1 - 15.9 g/dL Final  ? ?HCT  ?Date Value Ref Range Status  ?02/09/2021 47.4 (H) 36.0 - 46.0 % Final  ? ?Hematocrit  ?Date Value Ref Range Status  ?08/22/2020 45.6 34.0 - 46.6 % Final  ? ?MCHC  ?Date Value Ref Range Status  ?02/09/2021 33.1 30.0 - 36.0 g/dL Final  ? ?MCH  ?Date Value Ref Range Status  ?02/09/2021 30.3 26.0 - 34.0 pg Final  ? ?MCV  ?Date Value Ref Range Status  ?02/09/2021 91.3 80.0 - 100.0 fL Final  ?08/22/2020 88 79 - 97 fL Final  ? ?No results found for: PLTCOUNTKUC, LABPLAT, [place] ?RDW  ?Date Value Ref Range Status  ?02/09/2021 13.0 11.5 - 15.5 % Final  ?08/22/2020 12.1 11.7 - 15.4 % Final  ? ?  ?  ?  ? ?

## 2021-07-14 ENCOUNTER — Encounter: Payer: Self-pay | Admitting: Oncology

## 2021-07-14 ENCOUNTER — Inpatient Hospital Stay: Payer: Medicare Other | Attending: Oncology | Admitting: Oncology

## 2021-07-14 VITALS — BP 119/81 | HR 93 | Temp 98.4°F | Resp 20 | Wt 360.0 lb

## 2021-07-14 DIAGNOSIS — Z853 Personal history of malignant neoplasm of breast: Secondary | ICD-10-CM | POA: Diagnosis not present

## 2021-07-14 DIAGNOSIS — Z79899 Other long term (current) drug therapy: Secondary | ICD-10-CM | POA: Diagnosis not present

## 2021-07-14 DIAGNOSIS — Z170 Estrogen receptor positive status [ER+]: Secondary | ICD-10-CM | POA: Insufficient documentation

## 2021-07-14 DIAGNOSIS — Z08 Encounter for follow-up examination after completed treatment for malignant neoplasm: Secondary | ICD-10-CM

## 2021-07-14 DIAGNOSIS — Z79811 Long term (current) use of aromatase inhibitors: Secondary | ICD-10-CM | POA: Insufficient documentation

## 2021-07-14 DIAGNOSIS — C50912 Malignant neoplasm of unspecified site of left female breast: Secondary | ICD-10-CM | POA: Diagnosis not present

## 2021-07-14 NOTE — Progress Notes (Signed)
Pt states it is still swollen under her left arm: also three small pin points on her nipple noticed since her last visit. Also, husband will like to know if letrozole can be sent for every 90 days vs 30 days.  ?

## 2021-07-14 NOTE — Progress Notes (Signed)
? ? ? ?Hematology/Oncology Consult note ?[facility]  ?[phone] [phone] ? ?Patient Care Team: ?Valerie Roys, DO as PCP - General (Family Medicine) ?Theodore Demark, RN as Oncology Nurse Navigator ?[name], MD as Consulting Physician (Radiation Oncology) ?Sindy Guadeloupe, MD as Consulting Physician (Oncology) ?Ronny Bacon, MD as Consulting Physician (General Surgery)  ? ?Name of the patient: Sharon Ware  ?[id number]  ?1950-05-26  ? ?Date of visit: 07/14/21 ? 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  ? ?Chief complaint/ Reason for visit-routine follow-up of breast cancer on letrozole ? ?Heme/Onc history: Patient is a 71 year old female with a past medical history significant for hypertension hyperlipidemia and diabetes who recently underwent a screening mammogram on 11/10/2020 which showed a possible distortion in the left breast.  Ultrasound showed a 1.2 x 0.8 x 0.7 cm mass 8 cm from the nipple at the 10:30 position.  No suspicious left axillary adenopathy.  This was biopsied and was consistent with invasive mammary carcinoma with tubular features grade 1.  No lymphovascular invasion identified.  ER/PR greater than 90% positive HER2 negative ?  ?Final pathology from lumpectomy showed tubular carcinoma grade 10 mm.  Multifocal superior margin positive.  Mutual decision between surgery radiation oncology and patient was to forego reexcision and proceed with adjuvant radiation treatment with radiation boost to the superior margin. ?  ?Patient completed adjuvant radiation treatment and started letrozole in November 2022.  Baseline bone density scan normal ?  ? ?Interval history-she is tolerating letrozole well without any significant side effects.  She has remained compliant with calcium and vitamin D as well.  Denies any specific breast concerns at this time. ? ?ECOG  PS- 2 ?Pain scale- 0 ? ?Review of systems- Review of Systems  ?Constitutional:  Negative for chills, fever, malaise/fatigue and weight loss.  ?HENT:  Negative for congestion, ear discharge and nosebleeds.   ?Eyes:  Negative for blurred vision.  ?Respiratory:  Negative for cough, hemoptysis, sputum production, shortness of breath and wheezing.   ?Cardiovascular:  Negative for chest pain, palpitations, orthopnea and claudication.  ?Gastrointestinal:  Negative for abdominal pain, blood in stool, constipation, diarrhea, heartburn, melena, nausea and vomiting.  ?Genitourinary:  Negative for dysuria, flank pain, frequency, hematuria and urgency.  ?Musculoskeletal:  Negative for back pain, joint pain and myalgias.  ?Skin:  Negative for rash.  ?Neurological:  Negative for dizziness, tingling, focal weakness, seizures, weakness and headaches.  ?Endo/Heme/Allergies:  Does not bruise/bleed easily.  ?Psychiatric/Behavioral:  Negative for depression and suicidal ideas. The patient does not have insomnia.    ? ? ?Allergies  ?Allergen Reactions  ? Other Anaphylaxis  ?  Honey Dew Melon  ? Wound Dressing Adhesive Rash  ? Morphine And Related Other (See Comments)  ?  Hypotension  ? Latex Rash  ? ? ? ?Past Medical History:  ?Diagnosis Date  ? Arthritis   ? Breast cancer ([place])   ? Cataract   ? Diabetes mellitus without complication ([place])   ? Hyperlipidemia   ? Hypertension   ? Vitamin D deficiency   ? ? ? ?Past Surgical History:  ?Procedure Laterality Date  ? ABDOMINAL HYSTERECTOMY    ? x2  ? BREAST BIOPSY Right 1990s  ? neg  ? BREAST BIOPSY Right 2000s  ? neg  ? BREAST BIOPSY Left 11/24/2020  ? u/s bx 10:30/8 cmfn-"heart" marker  ? BREAST EXCISIONAL BIOPSY Right   ? neg  ?  BREAST LUMPECTOMY WITH RADIOFREQUENCY TAG IDENTIFICATION Left 08/10/8500  ? Procedure: BREAST LUMPECTOMY WITH RADIOFREQUENCY TAG IDENTIFICATION;  Surgeon: Ronny Bacon, MD;  Location: [facility] ORS;  Service: General;  Laterality: Left;  ? DILATION AND CURETTAGE OF  UTERUS    ? EYE SURGERY  2004  ? cataract surgery and laser surgery  ? Salpingo-oophorectomy    ? ? ?Social History  ? ?Socioeconomic History  ? Marital status: Married  ?  Spouse name: Not on file  ? Number of children: Not on file  ? Years of education: Not on file  ? Highest education level: Not on file  ?Occupational History  ? Occupation: retired  ?Tobacco Use  ? Smoking status: Never  ? Smokeless tobacco: Never  ?Vaping Use  ? Vaping Use: Never used  ?Substance and Sexual Activity  ? Alcohol use: Not Currently  ? Drug use: Never  ? Sexual activity: Not Currently  ?Other Topics Concern  ? Not on file  ?Social History Narrative  ? Not on file  ? ?Social Determinants of Health  ? ?Financial Resource Strain: Low Risk   ? Difficulty of Paying Living Expenses: Not hard at all  ?Food Insecurity: No Food Insecurity  ? Worried About Charity fundraiser in the Last Year: Never true  ? Ran Out of Food in the Last Year: Never true  ?Transportation Needs: No Transportation Needs  ? Lack of Transportation (Medical): No  ? Lack of Transportation (Non-Medical): No  ?Physical Activity: Inactive  ? Days of Exercise per Week: 0 days  ? Minutes of Exercise per Session: 0 min  ?Stress: No Stress Concern Present  ? Feeling of Stress : Not at all  ?Social Connections: Not on file  ?Intimate Partner Violence: Not on file  ? ? ?Family History  ?Problem Relation Age of Onset  ? Breast cancer Mother 56  ? Heart disease Father   ? Cancer Paternal Grandfather   ? Heart disease Paternal Grandfather   ? ? ? ?Current Outpatient Medications:  ?  acetaminophen (TYLENOL) 500 MG tablet, Take 500 mg by mouth in the morning and at bedtime., Disp: , Rfl:  ?  aspirin 81 MG tablet, Take 81 mg by mouth daily., Disp: , Rfl:  ?  Calcium Carbonate-Vit D-Min (CALCIUM 1200 PO), Take 1,200 mg by mouth daily., Disp: , Rfl:  ?  Cholecalciferol (VITAMIN D3) 125 MCG (5000 UT) TABS, Take 5,000 Units by mouth daily., Disp: , Rfl:  ?  Glucosamine HCl 1500 MG TABS,  Take 1,500 mg by mouth in the morning and at bedtime., Disp: , Rfl:  ?  letrozole (FEMARA) 2.5 MG tablet, TAKE 1 TABLET BY MOUTH EVERY DAY, Disp: 30 tablet, Rfl: 0 ?  losartan-hydrochlorothiazide (HYZAAR) 50-12.5 MG tablet, Take 1 tablet by mouth daily., Disp: 90 tablet, Rfl: 1 ?  lovastatin (MEVACOR) 10 MG tablet, Take 1 tablet (10 mg total) by mouth daily., Disp: 90 tablet, Rfl: 1 ?  lovastatin (MEVACOR) 20 MG tablet, Take 1 tablet (20 mg total) by mouth at bedtime., Disp: 90 tablet, Rfl: 1 ?  Menthol, Topical Analgesic, (ICY HOT EX), Apply 1 application topically daily as needed (pain)., Disp: , Rfl:  ?  metFORMIN (GLUCOPHAGE-XR) 500 MG 24 hr tablet, Take 1 tab in the AM and 2 tabs in the PM, Disp: 270 tablet, Rfl: 1 ?  Omega-3 Fatty Acids (FISH OIL ULTRA) 1400 MG CAPS, Take 1,400 mg by mouth daily., Disp: , Rfl:  ?  spironolactone (ALDACTONE) 50 MG tablet, Take  1 tablet (50 mg total) by mouth daily., Disp: 90 tablet, Rfl: 1 ? ?Physical exam:  ?Vitals:  ? 07/14/21 1053  ?BP: 119/81  ?Pulse: 93  ?Resp: 20  ?Temp: 98.4 ?F (36.9 ?C)  ?SpO2: 94%  ?Weight: (!) 360 lb (163.3 kg)  ? ?Physical Exam ?Constitutional:   ?   Appearance: She is obese.  ?Cardiovascular:  ?   Rate and Rhythm: Normal rate and regular rhythm.  ?   Heart sounds: Normal heart sounds.  ?Pulmonary:  ?   Effort: Pulmonary effort is normal.  ?   Breath sounds: Normal breath sounds.  ?Abdominal:  ?   General: Bowel sounds are normal.  ?   Palpations: Abdomen is soft.  ?Skin: ?   General: Skin is warm and dry.  ?Neurological:  ?   Mental Status: She is alert and oriented to person, place, and time.  ? Breast exam was performed in seated and lying down position. ?Patient is status post left lumpectomy with a well-healed surgical scar. No evidence of any palpable masses. No evidence of axillary adenopathy. No evidence of any palpable masses or lumps in the right breast. No evidence of right axillary adenopathy ? ? ? ?  Latest Ref Rng & Units 04/15/2021  ?   9:55 AM  ?CMP  ?Glucose 70 - 99 mg/dL 193    ?BUN 8 - 23 mg/dL 32    ?Creatinine 0.44 - 1.00 mg/dL 0.90    ?Sodium 135 - 145 mmol/L 133    ?Potassium 3.5 - 5.1 mmol/L 4.3    ?Chloride 98 - 111 mmol/L 9

## 2021-07-21 ENCOUNTER — Other Ambulatory Visit: Payer: Self-pay | Admitting: Oncology

## 2021-07-22 MED ORDER — LETROZOLE 2.5 MG PO TABS: 2.5000 mg | ORAL_TABLET | Freq: Every day | ORAL | 0 refills | Status: DC

## 2021-07-23 ENCOUNTER — Other Ambulatory Visit: Payer: Self-pay

## 2021-07-31 LAB — HM DIABETES EYE EXAM

## 2021-08-20 ENCOUNTER — Other Ambulatory Visit: Payer: Self-pay | Admitting: Oncology

## 2021-08-27 ENCOUNTER — Ambulatory Visit
Admission: RE | Admit: 2021-08-27 | Discharge: 2021-08-27 | Disposition: A | Discharge status: Home / Self Care | Payer: Medicare Other | Source: Ambulatory Visit | Attending: Radiation Oncology | Admitting: Radiation Oncology

## 2021-08-27 VITALS — BP 139/80 | HR 78 | Temp 97.4°F | Resp 20

## 2021-08-27 DIAGNOSIS — Z170 Estrogen receptor positive status [ER+]: Secondary | ICD-10-CM | POA: Insufficient documentation

## 2021-08-27 DIAGNOSIS — Z923 Personal history of irradiation: Secondary | ICD-10-CM | POA: Diagnosis not present

## 2021-08-27 DIAGNOSIS — Z79811 Long term (current) use of aromatase inhibitors: Secondary | ICD-10-CM | POA: Insufficient documentation

## 2021-08-27 DIAGNOSIS — C50212 Malignant neoplasm of upper-inner quadrant of left female breast: Secondary | ICD-10-CM | POA: Diagnosis present

## 2021-08-27 NOTE — Progress Notes (Signed)
Radiation Oncology Follow up Note  Name: Sharon Ware   Date:   08/27/2021 MRN:  [id number] DOB: 1950/10/20    This 71 y.o. female presents to the clinic today 63-monthfollow-up status post whole breast radiation to her left breast for stage Ia ER/PR positive tubular carcinoma.  REFERRING PROVIDER: JValerie Roys DO  HPI: Patient is a 71year old female now out 6 months having pleated whole breast radiation to her left breast for stage Ia (T1 cN0 M0) ER/PR positive tubular carcinoma status post wide local excision with superior positive margin.  Seen today in routine follow-up she is doing well feels some lumpiness in her scar site which I assured her on exam is scar tissue.  She otherwise specifically denies breast tenderness cough or bone pain..  She has mammogram scheduled for August.  She is current on Femara tolerating it well without side effect.  COMPLICATIONS OF TREATMENT: none  FOLLOW UP COMPLIANCE: keeps appointments   PHYSICAL EXAM:  BP 139/80 (BP Location: Right Arm, Patient Position: Sitting, Cuff Size: Small)   Pulse 78   Temp (!) 97.4 F (36.3 C) (Tympanic)   Resp 20  Lungs are clear to A&P cardiac examination essentially unremarkable with regular rate and rhythm. No dominant mass or nodularity is noted in either breast in 2 positions examined. Incision is well-healed. No axillary or supraclavicular adenopathy is appreciated. Cosmetic result is excellent.  Lumpiness in her breast is consistent with scar tissue.  Well-developed well-nourished patient in NAD. HEENT reveals PERLA, EOMI, discs not visualized.  Oral cavity is clear. No oral mucosal lesions are identified. Neck is clear without evidence of cervical or supraclavicular adenopathy. Lungs are clear to A&P. Cardiac examination is essentially unremarkable with regular rate and rhythm without murmur rub or thrill. Abdomen is benign with no organomegaly or masses noted. Motor sensory and DTR levels are equal and  symmetric in the upper and lower extremities. Cranial nerves II through XII are grossly intact. Proprioception is intact. No peripheral adenopathy or edema is identified. No motor or sensory levels are noted. Crude visual fields are within normal range.  RADIOLOGY RESULTS: No current films to review  PLAN: Present time patient is doing well no evidence of disease now at 6 months from whole breast radiation and pleased with her overall progress.  I have asked to see her back in 6 months for follow-up.  She has mammogram scheduled for August.  Patient knows to call with any concerns.  She continues on Femara without side effect.  I would like to take this opportunity to thank you for allowing me to participate in the care of your patient..[name] MD

## 2021-09-03 ENCOUNTER — Ambulatory Visit: Payer: Medicare Other | Admitting: Family Medicine

## 2021-09-04 ENCOUNTER — Telehealth: Payer: Medicare Other | Admitting: Family Medicine

## 2021-09-04 ENCOUNTER — Ambulatory Visit: Payer: Self-pay | Admitting: *Deleted

## 2021-09-04 DIAGNOSIS — R300 Dysuria: Secondary | ICD-10-CM

## 2021-09-04 MED ORDER — SULFAMETHOXAZOLE-TRIMETHOPRIM 800-160 MG PO TABS: 1.0000 | ORAL_TABLET | Freq: Two times a day (BID) | ORAL | 0 refills | Status: AC

## 2021-09-04 NOTE — Telephone Encounter (Signed)
Summary: Cloudy urine and vaginal buring advice   Pt is calling for advice - Sx include cloudy urine and vaginal burning. No available appts today. Please advise     Reason for Disposition  Age > 50 years  Answer Assessment - Initial Assessment Questions 1. SEVERITY: "How bad is the pain?"  (e.g., Scale 1-10; mild, moderate, or severe)   - MILD (1-3): complains slightly about urination hurting   - MODERATE (4-7): interferes with normal activities     - SEVERE (8-10): excruciating, unwilling or unable to urinate because of the pain      mild 2. FREQUENCY: "How many times have you had painful urination today?"      none 3. PATTERN: "Is pain present every time you urinate or just sometimes?"      Not every time 4. ONSET: "When did the painful urination start?"      2 days 5. FEVER: "Do you have a fever?" If Yes, ask: "What is your temperature, how was it measured, and when did it start?"     no 6. PAST UTI: "Have you had a urine infection before?" If Yes, ask: "When was the last time?" and "What happened that time?"      Yes- March- patient was treated 7. CAUSE: "What do you think is causing the painful urination?"  (e.g., UTI, scratch, Herpes sore)     *No Answer* 8. OTHER SYMPTOMS: "Do you have any other symptoms?" (e.g., flank pain, vaginal discharge, genital sores, urgency, blood in urine)     *No Answer* 9. PREGNANCY: "Is there any chance you are pregnant?" "When was your last menstrual period?"     *No Answer*  Protocols used: Urination Pain - Female-A-AH

## 2021-09-04 NOTE — Patient Instructions (Signed)

## 2021-09-04 NOTE — Progress Notes (Signed)
Virtual Visit Consent   Sharon Ware, you are scheduled for a virtual visit with a [facility] provider today. Just as with appointments in the office, your consent must be obtained to participate. Your consent will be active for this visit and any virtual visit you may have with one of our providers in the next 365 days. If you have a MyChart account, a copy of this consent can be sent to you electronically.  As this is a virtual visit, video technology does not allow for your provider to perform a traditional examination. This may limit your provider's ability to fully assess your condition. If your provider identifies any concerns that need to be evaluated in person or the need to arrange testing (such as labs, EKG, etc.), we will make arrangements to do so. Although advances in technology are sophisticated, we cannot ensure that it will always work on either your end or our end. If the connection with a video visit is poor, the visit may have to be switched to a telephone visit. With either a video or telephone visit, we are not always able to ensure that we have a secure connection.  By engaging in this virtual visit, you consent to the provision of healthcare and authorize for your insurance to be billed (if applicable) for the services provided during this visit. Depending on your insurance coverage, you may receive a charge related to this service.  I need to obtain your verbal consent now. Are you willing to proceed with your visit today? ANDA SOBOTTA has provided verbal consent on 09/04/2021 for a virtual visit (video or telephone). [name], NP  Date: 09/04/2021 9:33 AM  Virtual Visit via Video Note   I, [name], connected with  [name]  ([id number], 12-08-1950) on 09/04/21 at  9:30 AM EDT by a video-enabled telemedicine application and verified that I am speaking with the correct person using two identifiers.  Location: Patient: Virtual Visit Location Patient:  Home Provider: Virtual Visit Location Provider: Home Office   I discussed the limitations of evaluation and management by telemedicine and the availability of in person appointments. The patient expressed understanding and agreed to proceed.    History of Present Illness: Sharon Ware is a 71 y.o. who identifies as a female who was assigned female at birth, and is being seen today for UTI symptoms. Dysuria- new problem, onset 2-3 days ago. Increased in hesitancy and frequency. Started with burning, pain level is zero. Denies fever and chills, n/v, discharge, back or flank pain.   DM history- A1c 71% well controlled Started on Letrozole and has had UTI like symptoms several times since.   Problems:  Patient Active Problem List   Diagnosis Date Noted   Edema 01/30/2021   Malignant neoplasm of upper-inner quadrant of left breast in female, estrogen receptor positive ([place]) 12/01/2020   Goals of care, counseling/discussion 12/01/2020   Morbid obesity ([place]) 08/08/2018   BMI 60.0-69.9, adult ([place]) 08/08/2018   Diabetes mellitus without complication ([place]) 40/81/4481   Hyperlipidemia 08/08/2018   Hypertension associated with diabetes ([place]) 08/08/2018   Arthritis of both knees 08/08/2018   Vitamin D deficiency 08/08/2018    Allergies:  Allergies  Allergen Reactions   Other Anaphylaxis    Honey Dew Melon   Wound Dressing Adhesive Rash   Morphine And Related Other (See Comments)    Hypotension   Latex Rash   Medications:  Current Outpatient Medications:    acetaminophen (TYLENOL) 500  MG tablet, Take 500 mg by mouth in the morning and at bedtime., Disp: , Rfl:    aspirin 81 MG tablet, Take 81 mg by mouth daily., Disp: , Rfl:    Calcium Carbonate-Vit D-Min (CALCIUM 1200 PO), Take 1,200 mg by mouth daily., Disp: , Rfl:    Cholecalciferol (VITAMIN D3) 125 MCG (5000 UT) TABS, Take 5,000 Units by mouth daily., Disp: , Rfl:    Glucosamine HCl 1500 MG TABS, Take 1,500 mg by mouth in the  morning and at bedtime., Disp: , Rfl:    letrozole (FEMARA) 2.5 MG tablet, TAKE 1 TABLET BY MOUTH EVERY DAY, Disp: 90 tablet, Rfl: 1   losartan-hydrochlorothiazide (HYZAAR) 50-12.5 MG tablet, Take 1 tablet by mouth daily., Disp: 90 tablet, Rfl: 1   lovastatin (MEVACOR) 10 MG tablet, Take 1 tablet (10 mg total) by mouth daily. (Patient not taking: Reported on 07/14/2021), Disp: 90 tablet, Rfl: 1   lovastatin (MEVACOR) 20 MG tablet, Take 1 tablet (20 mg total) by mouth at bedtime., Disp: 90 tablet, Rfl: 1   Menthol, Topical Analgesic, (ICY HOT EX), Apply 1 application topically daily as needed (pain)., Disp: , Rfl:    metFORMIN (GLUCOPHAGE-XR) 500 MG 24 hr tablet, Take 1 tab in the AM and 2 tabs in the PM, Disp: 270 tablet, Rfl: 1   Omega-3 Fatty Acids (FISH OIL ULTRA) 1400 MG CAPS, Take 1,400 mg by mouth daily., Disp: , Rfl:    spironolactone (ALDACTONE) 50 MG tablet, Take 1 tablet (50 mg total) by mouth daily., Disp: 90 tablet, Rfl: 1  Observations/Objective: Patient is well-developed, well-nourished in no acute distress.  Resting comfortably  at home.  Head is normocephalic, atraumatic.  No labored breathing.  Speech is clear and coherent with logical content.  Patient is alert and oriented at baseline.    Assessment and Plan:  1. Dysuria  - sulfamethoxazole-trimethoprim (BACTRIM DS) 800-160 MG tablet; Take 1 tablet by mouth 2 (two) times daily for 5 days.  Dispense: 10 tablet; Refill: 0  Started on Letrozole for breast Ca, reports having increased UTI since then.  Overall doing well outside of that.  Will treat at this time, advised in person if not better.  No red flags to warrant in person today.  Strict in person cautions reviewed   Reviewed side effects, risks and benefits of medication.     Patient acknowledged agreement and understanding of the plan.    Follow Up Instructions: I discussed the assessment and treatment plan with the patient. The patient was provided an  opportunity to ask questions and all were answered. The patient agreed with the plan and demonstrated an understanding of the instructions.  A copy of instructions were sent to the patient via MyChart unless otherwise noted below.   The patient was advised to call back or seek an in-person evaluation if the symptoms worsen or if the condition fails to improve as anticipated.  Time:  I spent 15 minutes with the patient via telehealth technology discussing the above problems/concerns.    [name], NP

## 2021-09-22 ENCOUNTER — Ambulatory Visit (INDEPENDENT_AMBULATORY_CARE_PROVIDER_SITE_OTHER): Payer: Medicare Other | Admitting: Family Medicine

## 2021-09-22 ENCOUNTER — Encounter: Payer: Self-pay | Admitting: Family Medicine

## 2021-09-22 VITALS — BP 113/73 | HR 78 | Temp 98.2°F | Wt 360.0 lb

## 2021-09-22 DIAGNOSIS — E118 Type 2 diabetes mellitus with unspecified complications: Secondary | ICD-10-CM

## 2021-09-22 DIAGNOSIS — R8281 Pyuria: Secondary | ICD-10-CM

## 2021-09-22 DIAGNOSIS — E782 Mixed hyperlipidemia: Secondary | ICD-10-CM

## 2021-09-22 DIAGNOSIS — E1159 Type 2 diabetes mellitus with other circulatory complications: Secondary | ICD-10-CM | POA: Diagnosis not present

## 2021-09-22 DIAGNOSIS — I152 Hypertension secondary to endocrine disorders: Secondary | ICD-10-CM

## 2021-09-22 DIAGNOSIS — E559 Vitamin D deficiency, unspecified: Secondary | ICD-10-CM | POA: Diagnosis not present

## 2021-09-22 LAB — BAYER DCA HB A1C WAIVED: HB A1C (BAYER DCA - WAIVED): 6.7 % — ABNORMAL HIGH (ref 4.8–5.6)

## 2021-09-22 NOTE — Assessment & Plan Note (Signed)
Rechecking labs today. Await results. Treat as needed.  °

## 2021-09-22 NOTE — Assessment & Plan Note (Signed)
Doing great with A1c of 6.7 down from 7.3. Continue current regimen. Continue to monitor. Call with any concerns. Recheck 3 months.

## 2021-09-22 NOTE — Assessment & Plan Note (Addendum)
Under good control on current regimen. Continue current regimen. Continue to monitor. Call with any concerns. Refills up to date. Labs drawn today.  

## 2021-09-22 NOTE — Progress Notes (Signed)
BP 113/73   Pulse 78   Temp 98.2 F (36.8 C)   Wt (!) 360 lb (163.3 kg)   SpO2 95%   BMI 65.84 kg/m    Subjective:    Patient ID: Sharon Ware, female    DOB: [birth year], 71 y.o.   MRN: [id number]  HPI: Sharon Ware is a 71 y.o. female  Chief Complaint  Patient presents with   Diabetes   Hyperlipidemia   Edema    Patient states she went to dermatology last week and was told she had edema in both legs, patient states she has a brownish/red color to the bottom of her legs.    DIABETES Hypoglycemic episodes:no Polydipsia/polyuria: no Visual disturbance: no Chest pain: no Paresthesias: no Glucose Monitoring: no  Accucheck frequency: Not Checking Taking Insulin?: no Blood Pressure Monitoring: not checking Retinal Examination: Up to Date Foot Exam: Not up to Date Diabetic Education: Completed Pneumovax: Up to Date Influenza: Up to Date Aspirin: yes  HYPERTENSION / HYPERLIPIDEMIA Satisfied with current treatment? yes Duration of hypertension: chronic BP monitoring frequency: not checking BP medication side effects: no Past BP meds: spironalactone, losartan-HCTZ Duration of hyperlipidemia: chronic Cholesterol medication side effects: no Cholesterol supplements: none Past cholesterol medications: lovastatin Medication compliance: excellent compliance Aspirin: yes Recent stressors: no Recurrent headaches: no Visual changes: no Palpitations: no Dyspnea: no Chest pain: no Lower extremity edema: no Dizzy/lightheaded: no  Relevant past medical, surgical, family and social history reviewed and updated as indicated. Interim medical history since our last visit reviewed. Allergies and medications reviewed and updated.  Review of Systems  Constitutional: Negative.   Respiratory: Negative.    Cardiovascular: Negative.   Gastrointestinal: Negative.   Musculoskeletal: Negative.   Neurological: Negative.   Psychiatric/Behavioral: Negative.      Per HPI  unless specifically indicated above     Objective:    BP 113/73   Pulse 78   Temp 98.2 F (36.8 C)   Wt (!) 360 lb (163.3 kg)   SpO2 95%   BMI 65.84 kg/m   Wt Readings from Last 3 Encounters:  09/22/21 (!) 360 lb (163.3 kg)  07/14/21 (!) 360 lb (163.3 kg)  06/03/21 (!) 359 lb (162.8 kg)    Physical Exam Vitals and nursing note reviewed.  Constitutional:      General: She is not in acute distress.    Appearance: Normal appearance. She is obese. She is not ill-appearing, toxic-appearing or diaphoretic.  HENT:     Head: Normocephalic and atraumatic.     Right Ear: External ear normal.     Left Ear: External ear normal.     Nose: Nose normal.     Mouth/Throat:     Mouth: Mucous membranes are moist.     Pharynx: Oropharynx is clear.  Eyes:     General: No scleral icterus.       Right eye: No discharge.        Left eye: No discharge.     Extraocular Movements: Extraocular movements intact.     Conjunctiva/sclera: Conjunctivae normal.     Pupils: Pupils are equal, round, and reactive to light.  Cardiovascular:     Rate and Rhythm: Normal rate and regular rhythm.     Pulses: Normal pulses.     Heart sounds: Normal heart sounds. No murmur heard.    No friction rub. No gallop.  Pulmonary:     Effort: Pulmonary effort is normal. No respiratory distress.     Breath sounds: Normal  breath sounds. No stridor. No wheezing, rhonchi or rales.  Chest:     Chest wall: No tenderness.  Musculoskeletal:        General: Normal range of motion.     Cervical back: Normal range of motion and neck supple.  Skin:    General: Skin is warm and dry.     Capillary Refill: Capillary refill takes less than 2 seconds.     Coloration: Skin is not jaundiced or pale.     Findings: No bruising, erythema, lesion or rash.  Neurological:     General: No focal deficit present.     Mental Status: She is alert and oriented to person, place, and time. Mental status is at baseline.  Psychiatric:         Mood and Affect: Mood normal.        Behavior: Behavior normal.        Thought Content: Thought content normal.        Judgment: Judgment normal.     Results for orders placed or performed in visit on 09/22/21  Bayer DCA Hb A1c Waived  Result Value Ref Range   HB A1C (BAYER DCA - WAIVED) 6.7 (H) 4.8 - 5.6 %      Assessment & Plan:   Problem List Items Addressed This Visit       Cardiovascular and Mediastinum   Hypertension associated with diabetes ([place])    Under good control on current regimen. Continue current regimen. Continue to monitor. Call with any concerns. Refills up to date. Labs drawn today.        Relevant Orders   CBC with Differential/Platelet   Comprehensive metabolic panel   TSH   Microalbumin, Urine Waived     Endocrine   Controlled diabetes mellitus type 2 with complications ([place]) - Primary    Doing great with A1c of 6.7 down from 7.3. Continue current regimen. Continue to monitor. Call with any concerns. Recheck 3 months.       Relevant Orders   Bayer DCA Hb A1c Waived (Completed)   CBC with Differential/Platelet   Comprehensive metabolic panel   Urinalysis, Routine w reflex microscopic   Microalbumin, Urine Waived     Other   Hyperlipidemia    Under good control on current regimen. Continue current regimen. Continue to monitor. Call with any concerns. Refills up to date. Labs drawn today.        Relevant Orders   CBC with Differential/Platelet   Comprehensive metabolic panel   Lipid Panel w/o Chol/HDL Ratio   Vitamin D deficiency    Rechecking labs today. Await results. Treat as needed.       Relevant Orders   CBC with Differential/Platelet   Comprehensive metabolic panel   VITAMIN D 25 Hydroxy (Vit-D Deficiency, Fractures)     Follow up plan: Return in about 3 months (around 12/23/2021).

## 2021-09-23 LAB — COMPREHENSIVE METABOLIC PANEL
ALT: 29 IU/L (ref 0–32)
AST: 18 IU/L (ref 0–40)
Albumin/Globulin Ratio: 1.5 (ref 1.2–2.2)
Albumin: 4.3 g/dL (ref 3.7–4.7)
Alkaline Phosphatase: 56 IU/L (ref 44–121)
BUN/Creatinine Ratio: 31 — ABNORMAL HIGH (ref 12–28)
BUN: 27 mg/dL (ref 8–27)
Bilirubin Total: 0.3 mg/dL (ref 0.0–1.2)
CO2: 22 mmol/L (ref 20–29)
Calcium: 10.1 mg/dL (ref 8.7–10.3)
Chloride: 96 mmol/L (ref 96–106)
Creatinine, Ser: 0.87 mg/dL (ref 0.57–1.00)
Globulin, Total: 2.9 g/dL (ref 1.5–4.5)
Glucose: 175 mg/dL — ABNORMAL HIGH (ref 70–99)
Potassium: 4.7 mmol/L (ref 3.5–5.2)
Sodium: 139 mmol/L (ref 134–144)
Total Protein: 7.2 g/dL (ref 6.0–8.5)
eGFR: 70 mL/min/{1.73_m2} (ref >59)

## 2021-09-23 LAB — CBC WITH DIFFERENTIAL/PLATELET
Basophils Absolute: 0 10*3/uL (ref 0.0–0.2)
Basos: 1 %
EOS (ABSOLUTE): 0.2 10*3/uL (ref 0.0–0.4)
Eos: 3 %
Hematocrit: 46.3 % (ref 34.0–46.6)
Hemoglobin: 15.4 g/dL (ref 11.1–15.9)
Immature Grans (Abs): 0 10*3/uL (ref 0.0–0.1)
Immature Granulocytes: 0 %
Lymphocytes Absolute: 1.9 10*3/uL (ref 0.7–3.1)
Lymphs: 31 %
MCH: 30.4 pg (ref 26.6–33.0)
MCHC: 33.3 g/dL (ref 31.5–35.7)
MCV: 91 fL (ref 79–97)
Monocytes Absolute: 0.5 10*3/uL (ref 0.1–0.9)
Monocytes: 8 %
Neutrophils Absolute: 3.6 10*3/uL (ref 1.4–7.0)
Neutrophils: 57 %
Platelets: 203 10*3/uL (ref 150–450)
RBC: 5.07 x10E6/uL (ref 3.77–5.28)
RDW: 12.9 % (ref 11.7–15.4)
WBC: 6.2 10*3/uL (ref 3.4–10.8)

## 2021-09-23 LAB — VITAMIN D 25 HYDROXY (VIT D DEFICIENCY, FRACTURES): Vit D, 25-Hydroxy: 64.4 ng/mL (ref 30.0–100.0)

## 2021-09-23 LAB — LIPID PANEL W/O CHOL/HDL RATIO
Cholesterol, Total: 157 mg/dL (ref 100–199)
HDL: 49 mg/dL (ref >39)
LDL Chol Calc (NIH): 81 mg/dL (ref 0–99)
Triglycerides: 155 mg/dL — ABNORMAL HIGH (ref 0–149)
VLDL Cholesterol Cal: 27 mg/dL (ref 5–40)

## 2021-09-23 LAB — TSH: TSH: 1.71 u[IU]/mL (ref 0.450–4.500)

## 2021-09-24 ENCOUNTER — Other Ambulatory Visit: Payer: Self-pay

## 2021-09-24 DIAGNOSIS — E118 Type 2 diabetes mellitus with unspecified complications: Secondary | ICD-10-CM

## 2021-09-24 LAB — URINALYSIS, ROUTINE W REFLEX MICROSCOPIC
Bilirubin, UA: NEGATIVE
Glucose, UA: NEGATIVE
Ketones, UA: NEGATIVE
Nitrite, UA: POSITIVE — AB
Protein,UA: NEGATIVE
Specific Gravity, UA: 1.025 (ref 1.005–1.030)
Urobilinogen, Ur: 0.2 mg/dL (ref 0.2–1.0)
pH, UA: 5.5 (ref 5.0–7.5)

## 2021-09-24 LAB — MICROALBUMIN, URINE WAIVED
Creatinine, Urine Waived: 100 mg/dL (ref 10–300)
Microalb, Ur Waived: 30 mg/L — ABNORMAL HIGH (ref 0–19)
Microalb/Creat Ratio: <30 mg/g (ref <30)

## 2021-09-24 LAB — MICROSCOPIC EXAMINATION: WBC, UA: >30 /hpf — ABNORMAL HIGH (ref 0–5)

## 2021-09-24 NOTE — Addendum Note (Signed)
Addended by: Valerie Roys on: 09/24/2021 11:59 AM   Modules accepted: Orders

## 2021-09-28 ENCOUNTER — Telehealth: Payer: Self-pay

## 2021-09-28 LAB — URINE CULTURE

## 2021-09-29 ENCOUNTER — Other Ambulatory Visit: Payer: Self-pay | Admitting: Family Medicine

## 2021-09-29 MED ORDER — NITROFURANTOIN MONOHYD MACRO 100 MG PO CAPS: 100.0000 mg | ORAL_CAPSULE | Freq: Two times a day (BID) | ORAL | 0 refills | Status: DC

## 2021-09-29 NOTE — Telephone Encounter (Signed)
Results sent in result note 

## 2021-10-21 ENCOUNTER — Other Ambulatory Visit: Payer: Self-pay | Admitting: Family Medicine

## 2021-10-22 NOTE — Telephone Encounter (Signed)
Requested Prescriptions  Pending Prescriptions Disp Refills  . losartan-hydrochlorothiazide (HYZAAR) 50-12.5 MG tablet [Pharmacy Med Name: LOSARTAN-HCTZ 50-12.5 MG TAB] 90 tablet 1    Sig: TAKE 1 TABLET BY MOUTH EVERY DAY     Cardiovascular: ARB + Diuretic Combos Passed - 10/21/2021  1:44 PM      Passed - K in normal range and within 180 days    Potassium  Date Value Ref Range Status  09/22/2021 4.7 3.5 - 5.2 mmol/L Final         Passed - Na in normal range and within 180 days    Sodium  Date Value Ref Range Status  09/22/2021 139 134 - 144 mmol/L Final         Passed - Cr in normal range and within 180 days    Creatinine, Ser  Date Value Ref Range Status  09/22/2021 0.87 0.57 - 1.00 mg/dL Final         Passed - eGFR is 10 or above and within 180 days    GFR calc Af Amer  Date Value Ref Range Status  02/21/2020 90 >59 mL/min/1.73 Final    Comment:    **In accordance with recommendations from the NKF-ASN Task force,**   [facility] is in the process of updating its eGFR calculation to the   2021 CKD-EPI creatinine equation that estimates kidney function   without a race variable.    GFR, Estimated  Date Value Ref Range Status  04/15/2021 >60 >60 mL/min Final    Comment:    (NOTE) Calculated using the CKD-EPI Creatinine Equation (2021)    eGFR  Date Value Ref Range Status  09/22/2021 70 >59 mL/min/1.73 Final         Passed - Patient is not pregnant      Passed - Last BP in normal range    BP Readings from Last 1 Encounters:  09/22/21 113/73         Passed - Valid encounter within last 6 months    Recent Outpatient Visits          1 month ago Controlled type 2 diabetes mellitus with complication, without long-term current use of insulin ([place])   [place], Megan P, DO   4 months ago Diabetes mellitus without complication ([place])   [place], Megan P, DO   6 months ago Frequency of urination   [place]  Vigg, Avanti, MD   7 months ago Diabetes mellitus without complication ([place])   [place], Megan P, DO   8 months ago Hypertension associated with diabetes ([place])   [place], Lauren A, NP      Future Appointments            In 4 days  [place]   In 2 months [place], Megan P, DO [place], [facility]           . metFORMIN (GLUCOPHAGE-XR) 500 MG 24 hr tablet [Pharmacy Med Name: METFORMIN HCL ER 500 MG TABLET] 270 tablet 1    Sig: TAKE 1 TABLET IN THE MORNING AND 2 TABLETS IN THE EVENING     Endocrinology:  Diabetes - Biguanides Failed - 10/21/2021  1:44 PM      Failed - B12 Level in normal range and within 720 days    No results found for: "VITAMINB12"       Passed - Cr in normal range and within 360 days  Creatinine, Ser  Date Value Ref Range Status  09/22/2021 0.87 0.57 - 1.00 mg/dL Final         Passed - HBA1C is between 0 and 7.9 and within 180 days    HB A1C (BAYER DCA - WAIVED)  Date Value Ref Range Status  09/22/2021 6.7 (H) 4.8 - 5.6 % Final    Comment:             Prediabetes: 5.7 - 6.4          Diabetes: >6.4          Glycemic control for adults with diabetes: <7.0          Passed - eGFR in normal range and within 360 days    GFR calc Af Amer  Date Value Ref Range Status  02/21/2020 90 >59 mL/min/1.73 Final    Comment:    **In accordance with recommendations from the NKF-ASN Task force,**   [facility] is in the process of updating its eGFR calculation to the   2021 CKD-EPI creatinine equation that estimates kidney function   without a race variable.    GFR, Estimated  Date Value Ref Range Status  04/15/2021 >60 >60 mL/min Final    Comment:    (NOTE) Calculated using the CKD-EPI Creatinine Equation (2021)    eGFR  Date Value Ref Range Status  09/22/2021 70 >59 mL/min/1.73 Final         Passed - Valid encounter within last 6 months    Recent Outpatient Visits          1  month ago Controlled type 2 diabetes mellitus with complication, without long-term current use of insulin ([place])   [place], Megan P, DO   4 months ago Diabetes mellitus without complication ([place])   [facility] Johnson, Megan P, DO   6 months ago Frequency of urination   [facility] Vigg, Avanti, MD   7 months ago Diabetes mellitus without complication ([place])   [place], Megan P, DO   8 months ago Hypertension associated with diabetes ([place])   [place], Lauren A, NP      Future Appointments            In 4 days  [place]   In 2 months Johnson, Megan P, DO [facility], [facility]           Passed - CBC within normal limits and completed in the last 12 months    WBC  Date Value Ref Range Status  09/22/2021 6.2 3.4 - 10.8 x10E3/uL Final  02/09/2021 10.3 4.0 - 10.5 K/uL Final   RBC  Date Value Ref Range Status  09/22/2021 5.07 3.77 - 5.28 x10E6/uL Final  02/09/2021 5.19 (H) 3.87 - 5.11 MIL/uL Final   Hemoglobin  Date Value Ref Range Status  09/22/2021 15.4 11.1 - 15.9 g/dL Final   Hematocrit  Date Value Ref Range Status  09/22/2021 46.3 34.0 - 46.6 % Final   MCHC  Date Value Ref Range Status  09/22/2021 33.3 31.5 - 35.7 g/dL Final  02/09/2021 33.1 30.0 - 36.0 g/dL Final   [facility]  Date Value Ref Range Status  09/22/2021 30.4 26.6 - 33.0 pg Final  02/09/2021 30.3 26.0 - 34.0 pg Final   MCV  Date Value Ref Range Status  09/22/2021 91 79 - 97 fL Final   No results found for: "PLTCOUNTKUC", "LABPLAT", "POCPLA" RDW  Date Value Ref Range  Status  09/22/2021 12.9 11.7 - 15.4 % Final         . spironolactone (ALDACTONE) 50 MG tablet [Pharmacy Med Name: SPIRONOLACTONE 50 MG TABLET] 90 tablet 1    Sig: TAKE 1 TABLET BY MOUTH EVERY DAY     Cardiovascular: Diuretics - Aldosterone Antagonist Passed - 10/21/2021  1:44 PM      Passed - Cr in normal range  and within 180 days    Creatinine, Ser  Date Value Ref Range Status  09/22/2021 0.87 0.57 - 1.00 mg/dL Final         Passed - K in normal range and within 180 days    Potassium  Date Value Ref Range Status  09/22/2021 4.7 3.5 - 5.2 mmol/L Final         Passed - Na in normal range and within 180 days    Sodium  Date Value Ref Range Status  09/22/2021 139 134 - 144 mmol/L Final         Passed - eGFR is 30 or above and within 180 days    GFR calc Af Amer  Date Value Ref Range Status  02/21/2020 90 >59 mL/min/1.73 Final    Comment:    **In accordance with recommendations from the NKF-ASN Task force,**   [facility] is in the process of updating its eGFR calculation to the   2021 CKD-EPI creatinine equation that estimates kidney function   without a race variable.    GFR, Estimated  Date Value Ref Range Status  04/15/2021 >60 >60 mL/min Final    Comment:    (NOTE) Calculated using the CKD-EPI Creatinine Equation (2021)    eGFR  Date Value Ref Range Status  09/22/2021 70 >59 mL/min/1.73 Final         Passed - Last BP in normal range    BP Readings from Last 1 Encounters:  09/22/21 113/73         Passed - Valid encounter within last 6 months    Recent Outpatient Visits          1 month ago Controlled type 2 diabetes mellitus with complication, without long-term current use of insulin ([place])   [place], Megan P, DO   4 months ago Diabetes mellitus without complication ([place])   [place], Megan P, DO   6 months ago Frequency of urination   [place], MD   7 months ago Diabetes mellitus without complication [facility])   [place], Megan P, DO   8 months ago Hypertension associated with diabetes ([place])   [place], Lauren A, NP      Future Appointments            In 4 days  [place], [place]   In 2 months Johnson, Megan P, DO [facility], [facility]

## 2021-10-26 ENCOUNTER — Ambulatory Visit (INDEPENDENT_AMBULATORY_CARE_PROVIDER_SITE_OTHER): Payer: Medicare Other | Admitting: *Deleted

## 2021-10-26 DIAGNOSIS — Z0000 Encounter for general adult medical examination without abnormal findings: Secondary | ICD-10-CM

## 2021-10-26 NOTE — Patient Instructions (Signed)
Sharon Ware , Thank you for taking time to come for your Medicare Wellness Visit. I appreciate your ongoing commitment to your health goals. Please review the following plan we discussed and let me know if I can assist you in the future.   Screening recommendations/referrals: Colonoscopy: up to date Mammogram: up to date Bone Density: up to date Recommended yearly ophthalmology/optometry visit for glaucoma screening and checkup Recommended yearly dental visit for hygiene and checkup  Vaccinations: Influenza vaccine: up to date Pneumococcal vaccine: up to date Tdap vaccine: up to date Shingles vaccine: Education provided    Advanced directives: yes not on file  Conditions/risks identified:   Next appointment: 12-23-2021 @ 9:20  Sharon Ware   Preventive Care 24 Years and Older, Female Preventive care refers to lifestyle choices and visits with your health care provider that can promote health and wellness. What does preventive care include? A yearly physical exam. This is also called an annual well check. Dental exams once or twice a year. Routine eye exams. Ask your health care provider how often you should have your eyes checked. Personal lifestyle choices, including: Daily care of your teeth and gums. Regular physical activity. Eating a healthy diet. Avoiding tobacco and drug use. Limiting alcohol use. Practicing safe sex. Taking low-dose aspirin every day. Taking vitamin and mineral supplements as recommended by your health care provider. What happens during an annual well check? The services and screenings done by your health care provider during your annual well check will depend on your age, overall health, lifestyle risk factors, and family history of disease. Counseling  Your health care provider may ask you questions about your: Alcohol use. Tobacco use. Drug use. Emotional well-being. Home and relationship well-being. Sexual activity. Eating habits. History of  falls. Memory and ability to understand (cognition). Work and work Statistician. Reproductive health. Screening  You may have the following tests or measurements: Height, weight, and BMI. Blood pressure. Lipid and cholesterol levels. These may be checked every 5 years, or more frequently if you are over 19 years old. Skin check. Lung cancer screening. You may have this screening every year starting at age 78 if you have a 30-pack-year history of smoking and currently smoke or have quit within the past 15 years. Fecal occult blood test (FOBT) of the stool. You may have this test every year starting at age 45. Flexible sigmoidoscopy or colonoscopy. You may have a sigmoidoscopy every 5 years or a colonoscopy every 10 years starting at age 59. Hepatitis C blood test. Hepatitis B blood test. Sexually transmitted disease (STD) testing. Diabetes screening. This is done by checking your blood sugar (glucose) after you have not eaten for a while (fasting). You may have this done every 1-3 years. Bone density scan. This is done to screen for osteoporosis. You may have this done starting at age 74. Mammogram. This may be done every 1-2 years. Talk to your health care provider about how often you should have regular mammograms. Talk with your health care provider about your test results, treatment options, and if necessary, the need for more tests. Vaccines  Your health care provider may recommend certain vaccines, such as: Influenza vaccine. This is recommended every year. Tetanus, diphtheria, and acellular pertussis (Tdap, Td) vaccine. You may need a Td booster every 10 years. Zoster vaccine. You may need this after age 49. Pneumococcal 13-valent conjugate (PCV13) vaccine. One dose is recommended after age 22. Pneumococcal polysaccharide (PPSV23) vaccine. One dose is recommended after age 59. Talk to your  health care provider about which screenings and vaccines you need and how often you need  them. This information is not intended to replace advice given to you by your health care provider. Make sure you discuss any questions you have with your health care provider. Document Released: 04/18/2015 Document Revised: 12/10/2015 Document Reviewed: 01/21/2015 Elsevier Interactive Patient Education  2017 [place] Prevention in the Home Falls can cause injuries. They can happen to people of all ages. There are many things you can do to make your home safe and to help prevent falls. What can I do on the outside of my home? Regularly fix the edges of walkways and driveways and fix any cracks. Remove anything that might make you trip as you walk through a door, such as a raised step or threshold. Trim any bushes or trees on the path to your home. Use bright outdoor lighting. Clear any walking paths of anything that might make someone trip, such as rocks or tools. Regularly check to see if handrails are loose or broken. Make sure that both sides of any steps have handrails. Any raised decks and porches should have guardrails on the edges. Have any leaves, snow, or ice cleared regularly. Use sand or salt on walking paths during winter. Clean up any spills in your garage right away. This includes oil or grease spills. What can I do in the bathroom? Use night lights. Install grab bars by the toilet and in the tub and shower. Do not use towel bars as grab bars. Use non-skid mats or decals in the tub or shower. If you need to sit down in the shower, use a plastic, non-slip stool. Keep the floor dry. Clean up any water that spills on the floor as soon as it happens. Remove soap buildup in the tub or shower regularly. Attach bath mats securely with double-sided non-slip rug tape. Do not have throw rugs and other things on the floor that can make you trip. What can I do in the bedroom? Use night lights. Make sure that you have a light by your bed that is easy to reach. Do not use  any sheets or blankets that are too big for your bed. They should not hang down onto the floor. Have a firm chair that has side arms. You can use this for support while you get dressed. Do not have throw rugs and other things on the floor that can make you trip. What can I do in the kitchen? Clean up any spills right away. Avoid walking on wet floors. Keep items that you use a lot in easy-to-reach places. If you need to reach something above you, use a strong step stool that has a grab bar. Keep electrical cords out of the way. Do not use floor polish or wax that makes floors slippery. If you must use wax, use non-skid floor wax. Do not have throw rugs and other things on the floor that can make you trip. What can I do with my stairs? Do not leave any items on the stairs. Make sure that there are handrails on both sides of the stairs and use them. Fix handrails that are broken or loose. Make sure that handrails are as long as the stairways. Check any carpeting to make sure that it is firmly attached to the stairs. Fix any carpet that is loose or worn. Avoid having throw rugs at the top or bottom of the stairs. If you do have throw rugs, attach them  to the floor with carpet tape. Make sure that you have a light switch at the top of the stairs and the bottom of the stairs. If you do not have them, ask someone to add them for you. What else can I do to help prevent falls? Wear shoes that: Do not have high heels. Have rubber bottoms. Are comfortable and fit you well. Are closed at the toe. Do not wear sandals. If you use a stepladder: Make sure that it is fully opened. Do not climb a closed stepladder. Make sure that both sides of the stepladder are locked into place. Ask someone to hold it for you, if possible. Clearly mark and make sure that you can see: Any grab bars or handrails. First and last steps. Where the edge of each step is. Use tools that help you move around (mobility aids)  if they are needed. These include: Canes. Walkers. Scooters. Crutches. Turn on the lights when you go into a dark area. Replace any light bulbs as soon as they burn out. Set up your furniture so you have a clear path. Avoid moving your furniture around. If any of your floors are uneven, fix them. If there are any pets around you, be aware of where they are. Review your medicines with your doctor. Some medicines can make you feel dizzy. This can increase your chance of falling. Ask your doctor what other things that you can do to help prevent falls. This information is not intended to replace advice given to you by your health care provider. Make sure you discuss any questions you have with your health care provider. Document Released: 01/16/2009 Document Revised: 08/28/2015 Document Reviewed: 04/26/2014 Elsevier Interactive Patient Education  2017 [employer].

## 2021-10-26 NOTE — Progress Notes (Signed)
Subjective:   Sharon Ware is a 71 y.o. female who presents for Medicare Annual (Subsequent) preventive examination.  I connected with  [name] on 10/26/21 by a telephone enabled telemedicine application and verified that I am speaking with the correct person using two identifiers.   I discussed the limitations of evaluation and management by telemedicine. The patient expressed understanding and agreed to proceed.  Patient location: home  Provider location: Tele-Health-home    Review of Systems     Cardiac Risk Factors include: advanced age (>30mn, >>21women);diabetes mellitus;sedentary lifestyle;obesity (BMI >30kg/m2);family history of premature cardiovascular disease;hypertension     Objective:    Today's Vitals   There is no height or weight on file to calculate BMI.     10/26/2021    9:34 AM 08/27/2021   10:40 AM 07/14/2021   10:44 AM 03/20/2021   10:11 AM 01/19/2021   10:35 AM 12/22/2020   10:46 AM 12/10/2020   12:31 PM  Advanced Directives  Does Patient Have a Medical Advance Directive? Yes Yes No No No Yes Yes  Type of AIndustrial/product designerof [place] will    Living will Living will  Does patient want to make changes to medical advance directive?  No - Patient declined    No - Patient declined No - Patient declined  Copy of HIndianain Chart? No - copy requested No - copy requested       Would patient like information on creating a medical advance directive? No - Patient declined  No - Patient declined No - Patient declined No - Patient declined      Current Medications (verified) Outpatient Encounter Medications as of 10/26/2021  Medication Sig   acetaminophen (TYLENOL) 500 MG tablet Take 500 mg by mouth in the morning and at bedtime.   aspirin 81 MG tablet Take 81 mg by mouth daily.   Calcium Citrate-Vitamin D (CALCIUM CITRATE + D3 PO) Take 1,200 mg by mouth daily.    Cholecalciferol (VITAMIN D3) 125 MCG (5000 UT) TABS Take 5,000 Units by mouth daily.   Glucosamine HCl 1500 MG TABS Take 1,500 mg by mouth in the morning and at bedtime.   letrozole (FEMARA) 2.5 MG tablet TAKE 1 TABLET BY MOUTH EVERY DAY   losartan-hydrochlorothiazide (HYZAAR) 50-12.5 MG tablet TAKE 1 TABLET BY MOUTH EVERY DAY   lovastatin (MEVACOR) 20 MG tablet Take 1 tablet (20 mg total) by mouth at bedtime.   Menthol, Topical Analgesic, (ICY HOT EX) Apply 1 application topically daily as needed (pain).   metFORMIN (GLUCOPHAGE-XR) 500 MG 24 hr tablet TAKE 1 TABLET IN THE MORNING AND 2 TABLETS IN THE EVENING   nitrofurantoin, macrocrystal-monohydrate, (MACROBID) 100 MG capsule Take 1 capsule (100 mg total) by mouth 2 (two) times daily.   Omega-3 Fatty Acids (FISH OIL ULTRA) 1400 MG CAPS Take 1,400 mg by mouth daily.   spironolactone (ALDACTONE) 50 MG tablet TAKE 1 TABLET BY MOUTH EVERY DAY   UNABLE TO FIND Med Name: TAC 0.025/CERAVE   No facility-administered encounter medications on file as of 10/26/2021.    Allergies (verified) Other, Wound dressing adhesive, Morphine and related, and Latex   History: Past Medical History:  Diagnosis Date   Arthritis    [place]ast cancer ([place]    Cataract    Diabetes mellitus without complication ([place]    Hyperlipidemia    Hypertension    Vitamin D deficiency    Past Surgical History:  Procedure Laterality Date   ABDOMINAL HYSTERECTOMY     x2   BREAST BIOPSY Right 1990s   neg   BREAST BIOPSY Right 2000s   neg   BREAST BIOPSY Left 11/24/2020   u/s bx 10:30/8 cmfn-"heart" marker   BREAST EXCISIONAL BIOPSY Right    neg   BREAST LUMPECTOMY WITH RADIOFREQUENCY TAG IDENTIFICATION Left 04/10/1094   Procedure: BREAST LUMPECTOMY WITH RADIOFREQUENCY TAG IDENTIFICATION;  Surgeon: Ronny Bacon, MD;  Location: [facility] ORS;  Service: General;  Laterality: Left;   DILATION AND CURETTAGE OF UTERUS     EYE SURGERY  2004   cataract surgery and laser surgery    Salpingo-oophorectomy     Family History  Problem Relation Age of Onset   Breast cancer Mother 24   Heart disease Father    Cancer Paternal Grandfather    Heart disease Paternal Grandfather    Social History   Socioeconomic History   Marital status: Married    Spouse name: Not on file   Number of children: Not on file   Years of education: Not on file   Highest education level: Not on file  Occupational History   Occupation: retired  Tobacco Use   Smoking status: Never   Smokeless tobacco: Never  Vaping Use   Vaping Use: Never used  Substance and Sexual Activity   Alcohol use: Not Currently   Drug use: Never   Sexual activity: Not Currently  Other Topics Concern   Not on file  Social History Narrative   Not on file   Social Determinants of Health   Financial Resource Strain: Low Risk  (10/26/2021)   Overall Financial Resource Strain (CARDIA)    Difficulty of Paying Living Expenses: Not hard at all  Food Insecurity: No Food Insecurity (10/26/2021)   Hunger Vital Sign    Worried About Running Out of Food in the Last Year: Never true    [place] in the Last Year: Never true  Transportation Needs: No Transportation Needs (10/26/2021)   PRAPARE - Hydrologist (Medical): No    Lack of Transportation (Non-Medical): No  Physical Activity: Inactive (10/26/2021)   Exercise Vital Sign    Days of Exercise per Week: 0 days    Minutes of Exercise per Session: 0 min  Stress: No Stress Concern Present (10/26/2021)   [place]    Feeling of Stress : Not at all  Social Connections: Moderately Isolated (10/26/2021)   Social Connection and Isolation Panel [NHANES]    Frequency of Communication with Friends and Family: More than three times a week    Frequency of Social Gatherings with Friends and Family: More than three times a week    Attends Religious Services: Never    Building surveyor or Organizations: No    Attends Music therapist: Never    Marital Status: Married    Tobacco Counseling Counseling given: Not Answered   Clinical Intake:  Pre-visit preparation completed: Yes  Pain : No/denies pain     Nutritional Risks: None Diabetes: Yes CBG done?: No Did pt. bring in CBG monitor from home?: No  How often do you need to have someone help you when you read instructions, pamphlets, or other written materials from your doctor or pharmacy?: 1 - Never  Diabetic?  Yes  Nutrition Risk Assessment:  Has the patient had any N/V/D within the last 2 months?  No  Does  the patient have any non-healing wounds?  No  Has the patient had any unintentional weight loss or weight gain?  No   Diabetes:  Is the patient diabetic?  Yes  If diabetic, was a CBG obtained today?  No  Did the patient bring in their glucometer from home?  No  How often do you monitor your CBG's? Does not check.   Financial Strains and Diabetes Management:  Are you having any financial strains with the device, your supplies or your medication? No .  Does the patient want to be seen by Chronic Care Management for management of their diabetes?  No  Would the patient like to be referred to a Nutritionist or for Diabetic Management?  No   Diabetic Exams:  Diabetic Eye Exam: Pt has been advised about the importance in completing this exam Diabetic Foot Exam: Pt has been advised about the importance in completing this exam.  Interpreter Needed?: No  Information entered by :: Leroy Kennedy LPN   Activities of Daily Living    10/26/2021    9:40 AM 12/05/2020   10:59 AM  In your present state of health, do you have any difficulty performing the following activities:  Hearing? 0   Vision? 0   Difficulty concentrating or making decisions? 0   Comment patient uses of walker but is able to go up to stairs   Dressing or bathing? 1   Doing errands, shopping? 0 0   Preparing Food and eating ? N   Using the Toilet? N   In the past six months, have you accidently leaked urine? Y   Do you have problems with loss of bowel control? N   Managing your Medications? N   Managing your Finances? N   Housekeeping or managing your Housekeeping? N     Patient Care Team: Valerie Roys, DO as PCP - General (Family Medicine) Theodore Demark, RN (Inactive) as Oncology Nurse Navigator [name], MD as Consulting Physician (Radiation Oncology) Sindy Guadeloupe, MD as Consulting Physician (Oncology) Ronny Bacon, MD as Consulting Physician (General Surgery)  Indicate any recent Medical Services you may have received from other than [facility] providers in the past year (date may be approximate).     Assessment:   This is a routine wellness examination for [facility].  Hearing/Vision screen Hearing Screening - Comments:: No trouble hearing Vision Screening - Comments:: Up to date Dr. Edison Pace  Dietary issues and exercise activities discussed: Current Exercise Habits: The patient does not participate in regular exercise at present, Exercise limited by: orthopedic condition(s)   Goals Addressed             This Visit's Progress    Patient Stated       Maintain A1C       Depression Screen    10/26/2021    9:39 AM 06/03/2021    9:59 AM 01/30/2021    1:15 PM 10/24/2020   10:33 AM 06/24/2020   10:57 AM 08/20/2019    9:13 AM 08/08/2018    9:18 AM  PHQ 2/9 Scores  PHQ - 2 Score 0 0 0 0 0 0 0  PHQ- 9 Score  0 0   0 0    Fall Risk    10/26/2021    9:33 AM 06/03/2021    9:58 AM 04/13/2021    9:12 AM 01/30/2021    1:15 PM 12/04/2020    9:24 AM  Fall Risk   Falls in the past year? 0 0  0 0 0  Number falls in past yr: 0 0 0 0 0  Injury with Fall? 0 0 0 0 0  Risk for fall due to : Impaired balance/gait Impaired mobility Impaired mobility No Fall Risks No Fall Risks  Follow up Falls evaluation completed;Education provided;Falls prevention discussed Falls  evaluation completed Falls evaluation completed  Falls evaluation completed    FALL RISK PREVENTION PERTAINING TO THE HOME:  Any stairs in or around the home? Yes  If so, are there any without handrails? No  Home free of loose throw rugs in walkways, pet beds, electrical cords, etc? Yes  Adequate lighting in your home to reduce risk of falls? Yes   ASSISTIVE DEVICES UTILIZED TO PREVENT FALLS:  Life alert? No  Use of a cane, walker or w/c? Yes  Grab bars in the bathroom? Yes  Shower chair or bench in shower? Yes  Elevated toilet seat or a handicapped toilet? No   TIMED UP AND GO:  Was the test performed? No .    Cognitive Function:        10/24/2020   10:34 AM  6CIT Screen  What Year? 0 points  What month? 0 points  What time? 0 points  Count back from 20 0 points  Months in reverse 0 points  Repeat phrase 0 points  Total Score 0 points    Immunizations Immunization History  Administered Date(s) Administered   Fluad Quad(high Dose 65+) 01/09/2020, 12/19/2020   Influenza-Unspecified 12/09/2018   Moderna Sars-Covid-2 Vaccination 05/08/2019, 06/05/2019, 02/15/2020, 10/28/2020, 03/16/2021   Pneumococcal Conjugate-13 02/03/2014   Pneumococcal Polysaccharide-23 04/05/2009, 04/05/2014   Td 08/09/2018    TDAP status: Up to date  Flu Vaccine status: Up to date  Pneumococcal vaccine status: Up to date  Covid-19 vaccine status: Information provided on how to obtain vaccines.   Qualifies for Shingles Vaccine? Yes   Zostavax completed No   Shingrix Completed?: No.    Education has been provided regarding the importance of this vaccine. Patient has been advised to call insurance company to determine out of pocket expense if they have not yet received this vaccine. Advised may also receive vaccine at local pharmacy or Health Dept. Verbalized acceptance and understanding.  Screening Tests Health Maintenance  Topic Date Due   FOOT EXAM  08/22/2021   COVID-19 Vaccine (6  - Booster for Moderna series) 11/11/2021 (Originally 05/11/2021)   Hepatitis C Screening  12/04/2021 (Originally 09/08/1968)   Zoster Vaccines- Shingrix (1 of 2) 01/26/2022 (Originally 09/08/1969)   INFLUENZA VACCINE  11/03/2021   HEMOGLOBIN A1C  03/24/2022   OPHTHALMOLOGY EXAM  08/01/2022   MAMMOGRAM  11/11/2022   Fecal DNA (Cologuard)  09/19/2023   TETANUS/TDAP  08/08/2028   Pneumonia Vaccine 53+ Years old  Completed   DEXA SCAN  Completed   HPV VACCINES  Aged Out    Health Maintenance  Health Maintenance Due  Topic Date Due   FOOT EXAM  08/22/2021    Colorectal cancer screening: Type of screening: Cologuard. Completed 2022. Repeat every 3 years   Mammogram is scheduled  Bone Density status: Completed 2022. Results reflect: Bone density results: NORMAL. Repeat every 2 years.  Lung Cancer Screening: (Low Dose CT Chest recommended if Age 50-80 years, 30 pack-year currently smoking OR have quit w/in 15years.) does not qualify.   Lung Cancer Screening Referral:   Additional Screening:  Hepatitis C Screening: does qualify  Vision Screening: Recommended annual ophthalmology exams for early detection of glaucoma and other disorders of the  eye. Is the patient up to date with their annual eye exam?  Yes  Who is the provider or what is the name of the office in which the patient attends annual eye exams? Dr. Edison Pace If pt is not established with a provider, would they like to be referred to a provider to establish care? No .   Dental Screening: Recommended annual dental exams for proper oral hygiene  Community Resource Referral / Chronic Care Management: CRR required this visit?  No   CCM required this visit?  No      Plan:     I have personally reviewed and noted the following in the patient's chart:   Medical and social history Use of alcohol, tobacco or illicit drugs  Current medications and supplements including opioid prescriptions.  Functional ability and  status Nutritional status Physical activity Advanced directives List of other physicians Hospitalizations, surgeries, and ER visits in previous 12 months Vitals Screenings to include cognitive, depression, and falls Referrals and appointments  In addition, I have reviewed and discussed with patient certain preventive protocols, quality metrics, and best practice recommendations. A written personalized care plan for preventive services as well as general preventive health recommendations were provided to patient.     Leroy Kennedy, LPN   09/16/4313   Nurse Notes:

## 2021-11-09 ENCOUNTER — Ambulatory Visit
Admission: RE | Admit: 2021-11-09 | Discharge: 2021-11-09 | Disposition: A | Discharge status: Home / Self Care | Payer: Medicare Other | Source: Ambulatory Visit | Attending: Oncology | Admitting: Oncology

## 2021-11-09 DIAGNOSIS — Z08 Encounter for follow-up examination after completed treatment for malignant neoplasm: Secondary | ICD-10-CM | POA: Insufficient documentation

## 2021-11-09 DIAGNOSIS — Z853 Personal history of malignant neoplasm of breast: Secondary | ICD-10-CM | POA: Insufficient documentation

## 2021-11-28 ENCOUNTER — Other Ambulatory Visit: Payer: Self-pay | Admitting: Family Medicine

## 2021-11-30 NOTE — Telephone Encounter (Signed)
Requested Prescriptions  Pending Prescriptions Disp Refills  . lovastatin (MEVACOR) 20 MG tablet [Pharmacy Med Name: LOVASTATIN 20 MG TABLET] 90 tablet 3    Sig: TAKE 1 TABLET BY MOUTH EVERYDAY AT BEDTIME     Cardiovascular:  Antilipid - Statins 2 Failed - 11/28/2021  9:53 AM      Failed - Lipid Panel in normal range within the last 12 months    Cholesterol, Total  Date Value Ref Range Status  09/22/2021 157 100 - 199 mg/dL Final   LDL Chol Calc (NIH)  Date Value Ref Range Status  09/22/2021 81 0 - 99 mg/dL Final   HDL  Date Value Ref Range Status  09/22/2021 49 >39 mg/dL Final   Triglycerides  Date Value Ref Range Status  09/22/2021 155 (H) 0 - 149 mg/dL Final         Passed - Cr in normal range and within 360 days    Creatinine, Ser  Date Value Ref Range Status  09/22/2021 0.87 0.57 - 1.00 mg/dL Final         Passed - Patient is not pregnant      Passed - Valid encounter within last 12 months    Recent Outpatient Visits          2 months ago Controlled type 2 diabetes mellitus with complication, without long-term current use of insulin ([place])   [place], Megan P, DO   6 months ago Diabetes mellitus without complication ([place])   [place], Megan P, DO   7 months ago Frequency of urination   [facility] Vigg, Avanti, MD   9 months ago Diabetes mellitus without complication ([place])   [place], Megan P, DO   10 months ago Hypertension associated with diabetes ([place])   [place], Lauren A, NP      Future Appointments            In 3 weeks Johnson, Barb Merino, DO [place], [facility]

## 2021-12-23 ENCOUNTER — Encounter: Payer: Self-pay | Admitting: Family Medicine

## 2021-12-23 ENCOUNTER — Ambulatory Visit (INDEPENDENT_AMBULATORY_CARE_PROVIDER_SITE_OTHER): Payer: Medicare Other | Admitting: Family Medicine

## 2021-12-23 VITALS — BP 117/76 | HR 82 | Temp 98.4°F | Wt 355.0 lb

## 2021-12-23 DIAGNOSIS — Z23 Encounter for immunization: Secondary | ICD-10-CM | POA: Diagnosis not present

## 2021-12-23 DIAGNOSIS — Z1159 Encounter for screening for other viral diseases: Secondary | ICD-10-CM

## 2021-12-23 DIAGNOSIS — R609 Edema, unspecified: Secondary | ICD-10-CM | POA: Diagnosis not present

## 2021-12-23 DIAGNOSIS — E118 Type 2 diabetes mellitus with unspecified complications: Secondary | ICD-10-CM

## 2021-12-23 DIAGNOSIS — R8281 Pyuria: Secondary | ICD-10-CM | POA: Diagnosis not present

## 2021-12-23 LAB — URINALYSIS, ROUTINE W REFLEX MICROSCOPIC
Bilirubin, UA: NEGATIVE
Glucose, UA: NEGATIVE
Ketones, UA: NEGATIVE
Nitrite, UA: POSITIVE — AB
Specific Gravity, UA: 1.025 (ref 1.005–1.030)
Urobilinogen, Ur: 0.2 mg/dL (ref 0.2–1.0)
pH, UA: 5.5 (ref 5.0–7.5)

## 2021-12-23 LAB — MICROSCOPIC EXAMINATION

## 2021-12-23 LAB — BAYER DCA HB A1C WAIVED: HB A1C (BAYER DCA - WAIVED): 7.1 % — ABNORMAL HIGH (ref 4.8–5.6)

## 2021-12-23 MED ORDER — METFORMIN HCL ER 500 MG PO TB24: 1000.0000 mg | ORAL_TABLET | Freq: Two times a day (BID) | ORAL | 1 refills | Status: DC

## 2021-12-23 NOTE — Assessment & Plan Note (Signed)
Doing well with A1c of 7.1- will increase her metformin to '1000mg'$  BID and recheck 3 months. Call with any concerns.

## 2021-12-23 NOTE — Assessment & Plan Note (Signed)
Encouraged elevation and compression hose. Call with any concerns. Lungs clear.

## 2021-12-23 NOTE — Progress Notes (Signed)
BP 117/76   Pulse 82   Temp 98.4 F (36.9 C)   Wt (!) 355 lb (161 kg)   SpO2 93%   BMI 64.93 kg/m    Subjective:    Patient ID: Sharon Ware, female    DOB: 17-Dec-1950, 71 y.o.   MRN: [id number]  HPI: Sharon Ware is a 71 y.o. female  Chief Complaint  Patient presents with   Diabetes   Leg Swelling    Patient states she has been having swelling in her legs and ankle, was prescribed cream by dermatologist that seems to be helping a little but still has ankle swelling.    Urinary Tract Infection    Patient states some days she has cloudy urine, some days its clear.    URINARY SYMPTOMS Duration: 2 weeks Dysuria: no Urinary frequency: no Urgency: no Small volume voids: no Symptom severity:  mild Urinary incontinence: no Foul odor: no Hematuria: no Abdominal pain: no Back pain: no Suprapubic pain/pressure: no Flank pain: no Fever:  no Vomiting: no Relief with cranberry juice: no Relief with pyridium: no Status: stable Previous urinary tract infection: yes Recurrent urinary tract infection: no History of sexually transmitted disease: no Vaginal discharge: no Treatments attempted: increasing fluids   DIABETES Hypoglycemic episodes:no Polydipsia/polyuria: no Visual disturbance: no Chest pain: no Paresthesias: no Glucose Monitoring: no  Accucheck frequency: Not Checking Taking Insulin?: no Blood Pressure Monitoring: not checking Retinal Examination: Up to Date Foot Exam: Up to Date Diabetic Education: Completed Pneumovax: Up to Date Influenza: Up to Date  Relevant past medical, surgical, family and social history reviewed and updated as indicated. Interim medical history since our last visit reviewed. Allergies and medications reviewed and updated.  Review of Systems  Constitutional: Negative.   HENT: Negative.    Respiratory: Negative.    Cardiovascular:  Positive for leg swelling. Negative for chest pain and palpitations.  Gastrointestinal:  Negative.   Musculoskeletal: Negative.   Skin: Negative.   Psychiatric/Behavioral: Negative.      Per HPI unless specifically indicated above     Objective:    BP 117/76   Pulse 82   Temp 98.4 F (36.9 C)   Wt (!) 355 lb (161 kg)   SpO2 93%   BMI 64.93 kg/m   Wt Readings from Last 3 Encounters:  12/23/21 (!) 355 lb (161 kg)  09/22/21 (!) 360 lb (163.3 kg)  07/14/21 (!) 360 lb (163.3 kg)    Physical Exam Vitals and nursing note reviewed.  Constitutional:      General: She is not in acute distress.    Appearance: Normal appearance. She is obese. She is not ill-appearing, toxic-appearing or diaphoretic.  HENT:     Head: Normocephalic and atraumatic.     Right Ear: External ear normal.     Left Ear: External ear normal.     Nose: Nose normal.     Mouth/Throat:     Mouth: Mucous membranes are moist.     Pharynx: Oropharynx is clear.  Eyes:     General: No scleral icterus.       Right eye: No discharge.        Left eye: No discharge.     Extraocular Movements: Extraocular movements intact.     Conjunctiva/sclera: Conjunctivae normal.     Pupils: Pupils are equal, round, and reactive to light.  Cardiovascular:     Rate and Rhythm: Normal rate and regular rhythm.     Pulses: Normal pulses.  Heart sounds: Normal heart sounds. No murmur heard.    No friction rub. No gallop.  Pulmonary:     Effort: Pulmonary effort is normal. No respiratory distress.     Breath sounds: Normal breath sounds. No stridor. No wheezing, rhonchi or rales.  Chest:     Chest wall: No tenderness.  Musculoskeletal:        General: Normal range of motion.     Cervical back: Normal range of motion and neck supple.     Right lower leg: Edema (trace) present.     Left lower leg: Edema (trace) present.  Skin:    General: Skin is warm and dry.     Capillary Refill: Capillary refill takes less than 2 seconds.     Coloration: Skin is not jaundiced or pale.     Findings: No bruising, erythema,  lesion or rash.  Neurological:     General: No focal deficit present.     Mental Status: She is alert and oriented to person, place, and time. Mental status is at baseline.  Psychiatric:        Mood and Affect: Mood normal.        Behavior: Behavior normal.        Thought Content: Thought content normal.        Judgment: Judgment normal.     Results for orders placed or performed in visit on 12/23/21  Microscopic Examination   Urine  Result Value Ref Range   WBC, UA >30W 0 - 5 /hpf   RBC, Urine 3-10 (A) 0 - 2 /hpf   Epithelial Cells (non renal) 0-10 0 - 10 /hpf   Bacteria, UA Moderate (A) None seen/Few   Yeast, UA Present (A) None seen  Urinalysis, Routine w reflex microscopic  Result Value Ref Range   Specific Gravity, UA 1.025 1.005 - 1.030   pH, UA 5.5 5.0 - 7.5   Color, UA Yellow Yellow   Appearance Ur Turbid (A) Clear   Leukocytes,UA 3+ (A) Negative   Protein,UA Trace (A) Negative/Trace   Glucose, UA Negative Negative   Ketones, UA Negative Negative   RBC, UA 3+ (A) Negative   Bilirubin, UA Negative Negative   Urobilinogen, Ur 0.2 0.2 - 1.0 mg/dL   Nitrite, UA Positive (A) Negative   Microscopic Examination See below:   Bayer DCA Hb A1c Waived  Result Value Ref Range   HB A1C (BAYER DCA - WAIVED) 7.1 (H) 4.8 - 5.6 %      Assessment & Plan:   Problem List Items Addressed This Visit       Endocrine   Controlled diabetes mellitus type 2 with complications ([place]) - Primary    Doing well with A1c of 7.1- will increase her metformin to '1000mg'$  BID and recheck 3 months. Call with any concerns.       Relevant Medications   metFORMIN (GLUCOPHAGE-XR) 500 MG 24 hr tablet   Other Relevant Orders   Urinalysis, Routine w reflex microscopic (Completed)   Bayer DCA Hb A1c Waived (Completed)     Other   Edema    Encouraged elevation and compression hose. Call with any concerns. Lungs clear.       Other Visit Diagnoses     Pyuria       No symptoms. Will check  culture and treat as needed.    Relevant Orders   Urine Culture   Need for hepatitis C screening test       Labs drawn today.  Await results.    Relevant Orders   Hepatitis C Antibody   Need for influenza vaccination       Relevant Orders   Flu Vaccine QUAD High Dose(Fluad) (Completed)        Follow up plan: Return in about 3 months (around 03/24/2022).

## 2021-12-24 LAB — HEPATITIS C ANTIBODY: Hep C Virus Ab: NONREACTIVE

## 2021-12-28 ENCOUNTER — Other Ambulatory Visit: Payer: Self-pay | Admitting: Family Medicine

## 2021-12-28 LAB — URINE CULTURE

## 2021-12-28 MED ORDER — NITROFURANTOIN MONOHYD MACRO 100 MG PO CAPS: 100.0000 mg | ORAL_CAPSULE | Freq: Two times a day (BID) | ORAL | 0 refills | Status: DC

## 2021-12-29 ENCOUNTER — Telehealth: Payer: Self-pay | Admitting: Family Medicine

## 2021-12-29 MED ORDER — NITROFURANTOIN MONOHYD MACRO 100 MG PO CAPS: 100.0000 mg | ORAL_CAPSULE | Freq: Two times a day (BID) | ORAL | 0 refills | Status: DC

## 2021-12-29 NOTE — Telephone Encounter (Signed)
Patient called in about med ordered from appt on 09/25, nitrofurantoin, macrocrystal-monohydrate, (MACROBID) 100 MG. He stats its not at the pharmacy. Please callback, it says there was a power outage in [place] and that may be whats holding it up. [id number] [place]:  [phone] Fax:  [phone]

## 2021-12-29 NOTE — Telephone Encounter (Signed)
Please resend medication, pharmacy did not receive rx yesterday due to power outage.

## 2021-12-29 NOTE — Telephone Encounter (Signed)
Rx sent in

## 2022-01-13 ENCOUNTER — Ambulatory Visit: Payer: Medicare Other | Admitting: Oncology

## 2022-01-17 ENCOUNTER — Ambulatory Visit
Admission: EM | Admit: 2022-01-17 | Discharge: 2022-01-17 | Disposition: A | Discharge status: Home / Self Care | Payer: Medicare Other

## 2022-01-17 ENCOUNTER — Ambulatory Visit: Admit: 2022-01-17 | Payer: Medicare Other | Source: Home / Self Care

## 2022-01-17 ENCOUNTER — Encounter: Payer: Self-pay | Admitting: Emergency Medicine

## 2022-01-17 DIAGNOSIS — S81812A Laceration without foreign body, left lower leg, initial encounter: Secondary | ICD-10-CM | POA: Diagnosis not present

## 2022-01-17 NOTE — ED Provider Notes (Signed)
[facility]    CSN: [id number] Arrival date & time: 01/17/22  1134      History   Chief Complaint Chief Complaint  Patient presents with   Extremity Laceration    Left leg    HPI NOVA SCHMUHL is a 71 y.o. female.   Patient presents with laceration to the left lower extremity occurring yesterday at approximately 6 PM.  Endorses that leg was hit by the car door as she was trying to get out.  Was able to control bleeding and has been covered with bandage.  Able to bear weight to the lower extremity.  History of lymphedema.  Past Medical History:  Diagnosis Date   Arthritis    Breast cancer ([place])    Cataract    Diabetes mellitus without complication ([place])    Hyperlipidemia    Hypertension    Vitamin D deficiency     Patient Active Problem List   Diagnosis Date Noted   Edema 01/30/2021   Malignant neoplasm of upper-inner quadrant of left breast in female, estrogen receptor positive ([place]) 12/01/2020   Goals of care, counseling/discussion 12/01/2020   Morbid obesity ([place]) 08/08/2018   BMI 60.0-69.9, adult ([place]) 08/08/2018   Controlled diabetes mellitus type 2 with complications ([place]) 19/50/9326   Hyperlipidemia 08/08/2018   Hypertension associated with diabetes ([place]) 08/08/2018   Arthritis of both knees 08/08/2018   Vitamin D deficiency 08/08/2018    Past Surgical History:  Procedure Laterality Date   ABDOMINAL HYSTERECTOMY     x2   BREAST BIOPSY Right 1990s   neg   BREAST BIOPSY Right 2000s   neg   BREAST BIOPSY Left 11/24/2020   u/s bx 10:30/8 cmfn-"heart" marker   BREAST EXCISIONAL BIOPSY Right    neg   BREAST LUMPECTOMY     BREAST LUMPECTOMY WITH RADIOFREQUENCY TAG IDENTIFICATION Left 71/24/5809   Procedure: BREAST LUMPECTOMY WITH RADIOFREQUENCY TAG IDENTIFICATION;  Surgeon: Ronny Bacon, MD;  Location: [facility] ORS;  Service: General;  Laterality: Left;   [place] OF UTERUS     EYE SURGERY  2004   cataract surgery and laser  surgery   Salpingo-oophorectomy      OB History   No obstetric history on file.      Home Medications    Prior to Admission medications   Medication Sig Start Date End Date Taking? Authorizing Provider  aspirin 81 MG tablet Take 81 mg by mouth daily.   Yes [provider]  Calcium Citrate-Vitamin D (CALCIUM CITRATE + D3 PO) Take 1,200 mg by mouth daily.   Yes [provider]  Cholecalciferol (VITAMIN D3) 125 MCG (5000 UT) TABS Take 5,000 Units by mouth daily.   Yes [provider]  Glucosamine HCl 1500 MG TABS Take 1,500 mg by mouth in the morning and at bedtime.   Yes [provider]  letrozole (FEMARA) 2.5 MG tablet TAKE 1 TABLET BY MOUTH EVERY DAY 08/20/21  Yes Sindy Guadeloupe, MD  losartan-hydrochlorothiazide (HYZAAR) 50-12.5 MG tablet TAKE 1 TABLET BY MOUTH EVERY DAY 10/22/21  Yes Johnson, Megan P, DO  lovastatin (MEVACOR) 20 MG tablet TAKE 1 TABLET BY MOUTH EVERYDAY AT BEDTIME 11/30/21  Yes Johnson, Megan P, DO  metFORMIN (GLUCOPHAGE-XR) 500 MG 24 hr tablet Take 2 tablets (1,000 mg total) by mouth 2 (two) times daily with a meal. 12/23/21  Yes Johnson, Megan P, DO  Omega-3 Fatty Acids (FISH OIL ULTRA) 1400 MG CAPS Take 1,400 mg by mouth daily.   Yes  [provider]  spironolactone (ALDACTONE) 50 MG tablet TAKE 1 TABLET BY MOUTH EVERY DAY 10/22/21  Yes Johnson, Megan P, DO  acetaminophen (TYLENOL) 500 MG tablet Take 500 mg by mouth in the morning and at bedtime.    [provider]  Menthol, Topical Analgesic, (ICY HOT EX) Apply 1 application topically daily as needed (pain).    [provider]  nitrofurantoin, macrocrystal-monohydrate, (MACROBID) 100 MG capsule Take 1 capsule (100 mg total) by mouth 2 (two) times daily. 12/29/21   Valerie Roys, DO  UNABLE TO FIND Med Name: TAC 0.025/CERAVE    [provider]    Family History Family History  Problem Relation Age of Onset   Breast cancer Mother 90   Heart  disease Father    Cancer Paternal Grandfather    Heart disease Paternal Grandfather     Social History Social History   Tobacco Use   Smoking status: Never   Smokeless tobacco: Never  Vaping Use   Vaping Use: Never used  Substance Use Topics   Alcohol use: Not Currently   Drug use: Never     Allergies   Other, Wound dressing adhesive, Morphine and related, and Latex   Review of Systems Review of Systems  Constitutional: Negative.   Respiratory: Negative.    Cardiovascular: Negative.   Skin:  Positive for wound. Negative for color change, pallor and rash.     Physical Exam Triage Vital Signs ED Triage Vitals  Enc Vitals Group     BP 01/17/22 1229 (!) 156/77     Pulse Rate 01/17/22 1229 81     Resp 01/17/22 1229 16     Temp 01/17/22 1229 98.2 F (36.8 C)     Temp Source 01/17/22 1229 Oral     SpO2 01/17/22 1229 91 %     Weight 01/17/22 1226 (!) 354 lb 15.1 oz (161 kg)     Height 01/17/22 1226 '5\' 2"'$  (1.575 m)     Head Circumference --      Peak Flow --      Pain Score 01/17/22 1226 0     Pain Loc --      Pain Edu? --      Excl. in [place]? --    No data found.  Updated Vital Signs BP (!) 156/77 (BP Location: Left Arm)   Pulse 81   Temp 98.2 F (36.8 C) (Oral)   Resp 16   Ht '5\' 2"'$  (1.575 m)   Wt (!) 354 lb 15.1 oz (161 kg)   SpO2 91%   BMI 64.92 kg/m   Visual Acuity Right Eye Distance:   Left Eye Distance:   Bilateral Distance:    Right Eye Near:   Left Eye Near:    Bilateral Near:     Physical Exam Constitutional:      Appearance: Normal appearance.  HENT:     Head: Normocephalic.  Eyes:     Extraocular Movements: Extraocular movements intact.  Pulmonary:     Effort: Pulmonary effort is normal.  Skin:    Comments: 1 cm skin tear present to the lateral aspect of the left lower extremity, bleeding has subsided, serosanguineous fluid present at the site  Neurological:     Mental Status: She is alert and oriented to person, place, and time.  Mental status is at baseline.  Psychiatric:        Mood and Affect: Mood normal.        Behavior: Behavior normal.  UC Treatments / Results  Labs (all labs ordered are listed, but only abnormal results are displayed) Labs Reviewed - No data to display  EKG   Radiology No results found.  Procedures Procedures (including critical care time)  Medications Ordered in UC Medications - No data to display  Initial Impression / Assessment and Plan / UC Course  I have reviewed the triage vital signs and the nursing notes.  Pertinent labs & imaging results that were available during my care of the patient were reviewed by me and considered in my medical decision making (see chart for details).  Skin tear of the left lower leg without complication, initial encounter  Able to recover with skin, skin glue applied, tolerated well, advised to cleanse daily during normal pat dry apply topical antibiotic cream until healed advised to allow for naturally, given short precautions for signs of infection to follow-up with urgent care as needed for reevaluation Final Clinical Impressions(s) / UC Diagnoses   Final diagnoses:  Skin tear of left lower leg without complication, initial encounter     Discharge Instructions      Area like appears to be a skin tear, he has been cleansed here in the urgent care in glue applied to keep skin covering wound which will make it easier for it to heal  At this time the bleeding has stopped and your Surgicel was helpful, the drainage that you are saying is fluid from your edema  You may cleanse area daily with diluted soapy water, pat dry and apply topical antibiotic cream once a day  Do not pull off glue and allow it to fall off naturally  At any point if you begin to see increased pain, increased swelling, redness or puslike drainage please return for reevaluation for infection   ED Prescriptions   None    PDMP not reviewed this encounter.    Hans Eden, NP 01/17/22 1300

## 2022-01-17 NOTE — ED Triage Notes (Signed)
Patient states that she hit her left lower leg on the car door yesterday.  Paient states that she has a laceration to her left lower leg.

## 2022-01-17 NOTE — Discharge Instructions (Signed)
Area like appears to be a skin tear, he has been cleansed here in the urgent care in glue applied to keep skin covering wound which will make it easier for it to heal  At this time the bleeding has stopped and your Surgicel was helpful, the drainage that you are saying is fluid from your edema  You may cleanse area daily with diluted soapy water, pat dry and apply topical antibiotic cream once a day  Do not pull off glue and allow it to fall off naturally  At any point if you begin to see increased pain, increased swelling, redness or puslike drainage please return for reevaluation for infection

## 2022-01-18 ENCOUNTER — Encounter: Payer: Self-pay | Admitting: Oncology

## 2022-01-18 ENCOUNTER — Inpatient Hospital Stay: Payer: Medicare Other | Attending: Oncology | Admitting: Oncology

## 2022-01-18 VITALS — BP 136/88 | HR 88 | Temp 98.7°F | Resp 20 | Wt 364.0 lb

## 2022-01-18 DIAGNOSIS — Z79811 Long term (current) use of aromatase inhibitors: Secondary | ICD-10-CM | POA: Insufficient documentation

## 2022-01-18 DIAGNOSIS — Z170 Estrogen receptor positive status [ER+]: Secondary | ICD-10-CM | POA: Diagnosis not present

## 2022-01-18 DIAGNOSIS — Z853 Personal history of malignant neoplasm of breast: Secondary | ICD-10-CM | POA: Diagnosis not present

## 2022-01-18 DIAGNOSIS — C50912 Malignant neoplasm of unspecified site of left female breast: Secondary | ICD-10-CM | POA: Insufficient documentation

## 2022-01-18 DIAGNOSIS — Z08 Encounter for follow-up examination after completed treatment for malignant neoplasm: Secondary | ICD-10-CM

## 2022-01-18 NOTE — Progress Notes (Signed)
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  Ultrasound showed a 1.2 x 0.8 x 0.7 cm mass 8 cm from the nipple at the 10:30 position.  No suspicious left axillary adenopathy.  This was biopsied and was consistent with invasive mammary carcinoma with tubular features grade 1.  No lymphovascular invasion identified.  ER/PR greater than 90% positive HER2 negative   Final pathology from lumpectomy showed tubular carcinoma grade 10 mm.  Multifocal superior margin positive.  Mutual decision between surgery radiation oncology and patient was to forego reexcision and proceed with adjuvant radiation treatment with radiation boost to the superior margin.   Patient completed adjuvant radiation treatment and started letrozole in November 2022.  Baseline bone density scan normal    Interval history-she is doing well overall.  Tolerating letrozole well without any significant side effects.  She is also compliant with her calcium and vitamin D.  Denies any breast concerns  today  ECOG PS- 2 Pain scale- 0   Review of systems- Review of Systems  Constitutional:  Negative for chills, fever, malaise/fatigue and weight loss.  HENT:  Negative for congestion, ear discharge and nosebleeds.   Eyes:  Negative for blurred vision.  Respiratory:  Negative for cough, hemoptysis, sputum production, shortness of breath and wheezing.   Cardiovascular:  Negative for chest pain, palpitations, orthopnea and claudication.  Gastrointestinal:  Negative for abdominal pain, blood in stool, constipation, diarrhea, heartburn, melena, nausea and vomiting.  Genitourinary:  Negative for dysuria, flank pain, frequency, hematuria and urgency.  Musculoskeletal:  Negative for back pain, joint pain and myalgias.  Skin:  Negative for rash.  Neurological:  Negative for dizziness, tingling, focal weakness, seizures, weakness and headaches.  Endo/Heme/Allergies:  Does not bruise/bleed easily.  Psychiatric/Behavioral:  Negative for depression and suicidal ideas. The patient does not have insomnia.       Allergies  Allergen Reactions   Other Anaphylaxis    Honey Dew Melon   Wound Dressing Adhesive Rash   Morphine And Related Other (See Comments)    Hypotension   Latex Rash     Past Medical History:  Diagnosis Date   Arthritis    Breast cancer ([place])    Cataract    Diabetes mellitus without complication ([place])    Hyperlipidemia    Hypertension    Vitamin D deficiency      Past Surgical History:  Procedure Laterality Date   ABDOMINAL HYSTERECTOMY     x2   BREAST BIOPSY Right 1990s   neg   BREAST BIOPSY Right 2000s   neg   BREAST BIOPSY Left 11/24/2020   u/s bx 10:30/8 cmfn-"heart" marker   BREAST EXCISIONAL BIOPSY Right  neg   BREAST LUMPECTOMY     BREAST LUMPECTOMY WITH RADIOFREQUENCY TAG IDENTIFICATION Left 38/93/7342   Procedure: BREAST LUMPECTOMY WITH RADIOFREQUENCY TAG IDENTIFICATION;  Surgeon: Ronny Bacon, MD;  Location: [facility] ORS;  Service: General;   Laterality: Left;   DILATION AND CURETTAGE OF UTERUS     EYE SURGERY  2004   cataract surgery and laser surgery   Salpingo-oophorectomy      Social History   Socioeconomic History   Marital status: Married    Spouse name: Not on file   Number of children: Not on file   Years of education: Not on file   Highest education level: Not on file  Occupational History   Occupation: retired  Tobacco Use   Smoking status: Never   Smokeless tobacco: Never  Vaping Use   Vaping Use: Never used  Substance and Sexual Activity   Alcohol use: Not Currently   Drug use: Never   Sexual activity: Not Currently  Other Topics Concern   Not on file  Social History Narrative   Not on file   Social Determinants of Health   Financial Resource Strain: Low Risk  (10/26/2021)   Overall Financial Resource Strain (CARDIA)    Difficulty of Paying Living Expenses: Not hard at all  Food Insecurity: No Food Insecurity (10/26/2021)   Hunger Vital Sign    Worried About Running Out of Food in the Last Year: Never true    [place] in the Last Year: Never true  Transportation Needs: No Transportation Needs (10/26/2021)   PRAPARE - Hydrologist (Medical): No    Lack of Transportation (Non-Medical): No  Physical Activity: Inactive (10/26/2021)   Exercise Vital Sign    Days of Exercise per Week: 0 days    Minutes of Exercise per Session: 0 min  Stress: No Stress Concern Present (10/26/2021)   [place]    Feeling of Stress : Not at all  Social Connections: Moderately Isolated (10/26/2021)   Social Connection and Isolation Panel [NHANES]    Frequency of Communication with Friends and Family: More than three times a week    Frequency of Social Gatherings with Friends and Family: More than three times a week    Attends Religious Services: Never    Marine scientist or Organizations: No    Attends Theatre manager Meetings: Never    Marital Status: Married  Human resources officer Violence: Not At Risk (10/26/2021)   Humiliation, Afraid, Rape, and Kick questionnaire    Fear of Current or Ex-Partner: No    Emotionally Abused: No    Physically Abused: No    Sexually Abused: No    Family History  Problem Relation Age of Onset   Breast cancer Mother 76   Heart disease Father    Cancer Paternal Grandfather    Heart disease Paternal Grandfather      Current Outpatient Medications:    acetaminophen (TYLENOL) 500 MG tablet, Take 500 mg by mouth in the morning and at bedtime., Disp: , Rfl:    aspirin 81 MG tablet, Take 81 mg by mouth daily., Disp: , Rfl:    Calcium Citrate-Vitamin D (CALCIUM CITRATE + D3 PO), Take 1,200 mg by mouth daily., Disp: , Rfl:    Cholecalciferol (VITAMIN D3) 125 MCG (5000 UT) TABS, Take 5,000 Units by mouth daily., Disp: , Rfl:    Glucosamine HCl 1500 MG TABS, Take 1,500 mg by  mouth in the morning and at bedtime., Disp: , Rfl:    letrozole (FEMARA) 2.5 MG tablet, TAKE 1 TABLET BY MOUTH EVERY DAY, Disp: 90 tablet, Rfl: 1   losartan-hydrochlorothiazide (HYZAAR) 50-12.5 MG tablet, TAKE 1 TABLET BY MOUTH EVERY DAY, Disp: 90 tablet, Rfl: 1   lovastatin (MEVACOR) 20 MG tablet, TAKE 1 TABLET BY MOUTH EVERYDAY AT BEDTIME, Disp: 90 tablet, Rfl: 3   metFORMIN (GLUCOPHAGE-XR) 500 MG 24 hr tablet, Take 2 tablets (1,000 mg total) by mouth 2 (two) times daily with a meal., Disp: 360 tablet, Rfl: 1   Omega-3 Fatty Acids (FISH OIL ULTRA) 1400 MG CAPS, Take 1,400 mg by mouth daily., Disp: , Rfl:    spironolactone (ALDACTONE) 50 MG tablet, TAKE 1 TABLET BY MOUTH EVERY DAY, Disp: 90 tablet, Rfl: 1   UNABLE TO FIND, Med Name: TAC 0.025/CERAVE, Disp: , Rfl:    Menthol, Topical Analgesic, (ICY HOT EX), Apply 1 application topically daily as needed (pain)., Disp: , Rfl:   Physical exam:  Vitals:   01/18/22 0948  BP: 136/88  Pulse: 88  Resp: 20  Temp: 98.7 F (37.1 C)  SpO2: 95%   Weight: (!) 364 lb (165.1 kg)   Physical Exam Constitutional:      General: She is not in acute distress. Cardiovascular:     Rate and Rhythm: Normal rate and regular rhythm.     Heart sounds: Normal heart sounds.  Pulmonary:     Effort: Pulmonary effort is normal.     Breath sounds: Normal breath sounds.  Musculoskeletal:     Cervical back: Normal range of motion.  Skin:    General: Skin is warm and dry.  Neurological:     Mental Status: She is alert and oriented to person, place, and time.    Breast exam was performed in seated and lying down position. Patient is status post left lumpectomy with a well-healed surgical scar. No evidence of any palpable masses. No evidence of axillary adenopathy. No evidence of any palpable masses or lumps in the right breast. No evidence of right axillary adenopathy      Latest Ref Rng & Units 09/22/2021    9:23 AM  CMP  Glucose 70 - 99 mg/dL 175   BUN 8 - 27 mg/dL 27   Creatinine 0.57 - 1.00 mg/dL 0.87   Sodium 134 - 144 mmol/L 139   Potassium 3.5 - 5.2 mmol/L 4.7   Chloride 96 - 106 mmol/L 96   CO2 20 - 29 mmol/L 22   Calcium 8.7 - 10.3 mg/dL 10.1   Total Protein 6.0 - 8.5 g/dL 7.2   Total Bilirubin 0.0 - 1.2 mg/dL 0.3   Alkaline Phos 44 - 121 IU/L 56   AST 0 - 40 IU/L 18   ALT 0 - 32 IU/L 29       Latest Ref Rng & Units 09/22/2021    9:23 AM  CBC  WBC 3.4 - 10.8 x10E3/uL 6.2   Hemoglobin 11.1 - 15.9 g/dL 15.4   Hematocrit 34.0 - 46.6 % 46.3   Platelets 150 - 450 x10E3/uL 203      Assessment and plan- Patient is a 71 y.o. female with history of stage I ER/PR positive HER2 negative left breast cancer currently on letrozole.  She is here for routine follow-up  Clinically patient is doing well with no concerning signs and symptoms of recurrence based on today's exam.  She is tolerating letrozole along with calcium and vitamin D well.  No significant side effects.  Baseline bone density scan was normal.  I will see her back in 6  months no labs.  Her recent mammogram from August 2023 was within normal limits   Visit Diagnosis 1. Encounter for follow-up surveillance of breast cancer   2. Use of letrozole (Femara)      Dr. Randa Evens, MD, MPH [facility] [phone] 01/18/2022 1:15 PM

## 2022-02-01 ENCOUNTER — Ambulatory Visit
Admission: RE | Admit: 2022-02-01 | Discharge: 2022-02-01 | Disposition: A | Discharge status: Home / Self Care | Payer: Medicare Other | Source: Ambulatory Visit | Attending: Urgent Care | Admitting: Urgent Care

## 2022-02-01 VITALS — BP 149/87 | HR 88 | Temp 99.2°F | Resp 18

## 2022-02-01 DIAGNOSIS — L03116 Cellulitis of left lower limb: Secondary | ICD-10-CM | POA: Diagnosis not present

## 2022-02-01 DIAGNOSIS — I890 Lymphedema, not elsewhere classified: Secondary | ICD-10-CM

## 2022-02-01 DIAGNOSIS — S81802D Unspecified open wound, left lower leg, subsequent encounter: Secondary | ICD-10-CM

## 2022-02-01 MED ORDER — CEPHALEXIN 500 MG PO CAPS: 500.0000 mg | ORAL_CAPSULE | Freq: Four times a day (QID) | ORAL | 0 refills | Status: AC

## 2022-02-01 NOTE — ED Triage Notes (Signed)
Pt was seen 10/15 for a laceration to her calf. Yesterday the patient states the area started to hurt and turn red. She only has pain when she touches the wound.

## 2022-02-01 NOTE — ED Provider Notes (Signed)
[facility]    CSN: [id number] Arrival date & time: 02/01/22  1243      History   Chief Complaint Chief Complaint  Patient presents with   Laceration    Left calf. Came in previously cut was glued.  Seems to be becoming infected.  (Spreading redness, warm )area. - Entered by patient    HPI Sharon Ware is a 71 y.o. female.   Pleasant 71yo female presents today   Laceration   Past Medical History:  Diagnosis Date   Arthritis    Breast cancer ([place])    Cataract    Diabetes mellitus without complication ([place])    Hyperlipidemia    Hypertension    Vitamin D deficiency     Patient Active Problem List   Diagnosis Date Noted   Edema 01/30/2021   Malignant neoplasm of upper-inner quadrant of left breast in female, estrogen receptor positive ([place]) 12/01/2020   Goals of care, counseling/discussion 12/01/2020   Morbid obesity ([place]) 08/08/2018   BMI 60.0-69.9, adult ([place]) 08/08/2018   Controlled diabetes mellitus type 2 with complications ([place]) 69/62/9528   Hyperlipidemia 08/08/2018   Hypertension associated with diabetes ([place]) 08/08/2018   Arthritis of both knees 08/08/2018   Vitamin D deficiency 08/08/2018    Past Surgical History:  Procedure Laterality Date   ABDOMINAL HYSTERECTOMY     x2   BREAST BIOPSY Right 1990s   neg   BREAST BIOPSY Right 2000s   neg   BREAST BIOPSY Left 11/24/2020   u/s bx 10:30/8 cmfn-"heart" marker   BREAST EXCISIONAL BIOPSY Right    neg   BREAST LUMPECTOMY     BREAST LUMPECTOMY WITH RADIOFREQUENCY TAG IDENTIFICATION Left 41/32/4401   Procedure: BREAST LUMPECTOMY WITH RADIOFREQUENCY TAG IDENTIFICATION;  Surgeon: Ronny Bacon, MD;  Location: [facility] ORS;  Service: General;  Laterality: Left;   [place] OF UTERUS     EYE SURGERY  2004   cataract surgery and laser surgery   Salpingo-oophorectomy      OB History   No obstetric history on file.      Home Medications    Prior to Admission  medications   Medication Sig Start Date End Date Taking? Authorizing Provider  cephALEXin (KEFLEX) 500 MG capsule Take 1 capsule (500 mg total) by mouth 4 (four) times daily for 7 days. 02/01/22 02/08/22 Yes Zahria Ding L, PA  acetaminophen (TYLENOL) 500 MG tablet Take 500 mg by mouth in the morning and at bedtime.    [provider]  aspirin 81 MG tablet Take 81 mg by mouth daily.    [provider]  Calcium Citrate-Vitamin D (CALCIUM CITRATE + D3 PO) Take 1,200 mg by mouth daily.    [provider]  Cholecalciferol (VITAMIN D3) 125 MCG (5000 UT) TABS Take 5,000 Units by mouth daily.    [provider]  Glucosamine HCl 1500 MG TABS Take 1,500 mg by mouth in the morning and at bedtime.    [provider]  letrozole (FEMARA) 2.5 MG tablet TAKE 1 TABLET BY MOUTH EVERY DAY 08/20/21   Sindy Guadeloupe, MD  losartan-hydrochlorothiazide (HYZAAR) 50-12.5 MG tablet TAKE 1 TABLET BY MOUTH EVERY DAY 10/22/21   Johnson, Megan P, DO  lovastatin (MEVACOR) 20 MG tablet TAKE 1 TABLET BY MOUTH EVERYDAY AT BEDTIME 11/30/21   Johnson, Megan P, DO  Menthol, Topical Analgesic, (ICY HOT EX) Apply 1 application topically daily as needed (pain).    [provider]  metFORMIN (GLUCOPHAGE-XR) 500 MG  24 hr tablet Take 2 tablets (1,000 mg total) by mouth 2 (two) times daily with a meal. 12/23/21   Johnson, Megan P, DO  Omega-3 Fatty Acids (FISH OIL ULTRA) 1400 MG CAPS Take 1,400 mg by mouth daily.    [provider]  spironolactone (ALDACTONE) 50 MG tablet TAKE 1 TABLET BY MOUTH EVERY DAY 10/22/21   Park Liter P, DO  UNABLE TO FIND Med Name: TAC 0.025/CERAVE    [provider]    Family History Family History  Problem Relation Age of Onset   Breast cancer Mother 4   Heart disease Father    Cancer Paternal Grandfather    Heart disease Paternal Grandfather     Social History Social History   Tobacco Use   Smoking status: Never   Smokeless  tobacco: Never  Vaping Use   Vaping Use: Never used  Substance Use Topics   Alcohol use: Not Currently   Drug use: Never     Allergies   Other, Wound dressing adhesive, Morphine and related, and Latex   Review of Systems Review of Systems   Physical Exam Triage Vital Signs ED Triage Vitals  Enc Vitals Group     BP 02/01/22 1258 (!) 149/87     Pulse Rate 02/01/22 1258 88     Resp 02/01/22 1258 18     Temp 02/01/22 1258 99.2 F (37.3 C)     Temp Source 02/01/22 1258 Oral     SpO2 02/01/22 1258 93 %     Weight --      Height --      Head Circumference --      Peak Flow --      Pain Score 02/01/22 1256 0     Pain Loc --      Pain Edu? --      Excl. in [place]? --    No data found.  Updated Vital Signs BP (!) 149/87 (BP Location: Right Wrist)   Pulse 88   Temp 99.2 F (37.3 C) (Oral)   Resp 18   SpO2 93%   Visual Acuity Right Eye Distance:   Left Eye Distance:   Bilateral Distance:    Right Eye Near:   Left Eye Near:    Bilateral Near:     Physical Exam   UC Treatments / Results  Labs (all labs ordered are listed, but only abnormal results are displayed) Labs Reviewed - No data to display  EKG   Radiology No results found.  Procedures Procedures (including critical care time)  Medications Ordered in UC Medications - No data to display  Initial Impression / Assessment and Plan / UC Course  I have reviewed the triage vital signs and the nursing notes.  Pertinent labs & imaging results that were available during my care of the patient were reviewed by me and considered in my medical decision making (see chart for details).     *** Final Clinical Impressions(s) / UC Diagnoses   Final diagnoses:  Cellulitis of left lower extremity  Open wound of left lower leg, subsequent encounter  Lymphedema     Discharge Instructions      You have cellulitis, which is an infection of the skin of your left lower extremity. Please take the Keflex 4  times daily for the next week. Please apply Xeroform petroleum dressing in the morning, take off at night. Use a Telfa pad or a nonstick pad over top of the dressing.  Leave off for 12  hours daily. If your wound remains open > 14 days from now, please follow-up with wound care center. If the redness extends outside of the line drawn today, please return to clinic If you develop severe fatigue, lethargy or fever, please head to ER   ED Prescriptions     Medication Sig Dispense Auth. Provider   cephALEXin (KEFLEX) 500 MG capsule Take 1 capsule (500 mg total) by mouth 4 (four) times daily for 7 days. 28 capsule Yuriana Gaal L, PA      PDMP not reviewed this encounter.

## 2022-02-01 NOTE — Discharge Instructions (Addendum)
You have cellulitis, which is an infection of the skin of your left lower extremity. Please take the Keflex 4 times daily for the next week. Please apply Xeroform petroleum dressing in the morning, take off at night. Use a Telfa pad or a nonstick pad over top of the dressing.  Leave off for 12 hours daily. If your wound remains open > 14 days from now, please follow-up with wound care center. If the redness extends outside of the line drawn today, please return to clinic If you develop severe fatigue, lethargy or fever, please head to ER

## 2022-02-17 ENCOUNTER — Ambulatory Visit (INDEPENDENT_AMBULATORY_CARE_PROVIDER_SITE_OTHER): Payer: Medicare Other | Admitting: Family Medicine

## 2022-02-17 ENCOUNTER — Encounter: Payer: Self-pay | Admitting: Family Medicine

## 2022-02-17 VITALS — BP 112/73 | HR 99 | Temp 99.5°F | Wt 364.0 lb

## 2022-02-17 DIAGNOSIS — L03119 Cellulitis of unspecified part of limb: Secondary | ICD-10-CM

## 2022-02-17 DIAGNOSIS — S81812D Laceration without foreign body, left lower leg, subsequent encounter: Secondary | ICD-10-CM

## 2022-02-17 NOTE — Progress Notes (Signed)
BP 112/73   Pulse 99   Temp 99.5 F (37.5 C) (Oral)   Wt (!) 364 lb (165.1 kg)   SpO2 90%   BMI 66.58 kg/m    Subjective:    Patient ID: Sharon Ware, female    DOB: 06/19/1950, 71 y.o.   MRN: [id number]  HPI: Sharon Ware is a 71 y.o. female  Chief Complaint  Patient presents with   Laceration    Patient is here for Laceration on L Leg. Patient says she was closing the car door and hit it against the car door. Patient says that was around 11th of October. Patient says she was seen at [facility] and was prescribed an antibiotic and Zero-Flex. Patient was also Cellulitis.    LACERATION Mechanism of injury: caught on door Time since injury: about a month Pain: no Severity: mild Quality: N/A Bleeding: no Redness: no Oozing: no Pus: no Fevers: no Nausea/vomiting: no Status: better Treatments attempted: abx and zero-flex Tetanus: Up to Date  Relevant past medical, surgical, family and social history reviewed and updated as indicated. Interim medical history since our last visit reviewed. Allergies and medications reviewed and updated.  Review of Systems  Constitutional: Negative.   Respiratory: Negative.    Cardiovascular: Negative.   Gastrointestinal: Negative.   Musculoskeletal: Negative.   Skin:  Positive for wound. Negative for color change, pallor and rash.  Psychiatric/Behavioral: Negative.      Per HPI unless specifically indicated above     Objective:    BP 112/73   Pulse 99   Temp 99.5 F (37.5 C) (Oral)   Wt (!) 364 lb (165.1 kg)   SpO2 90%   BMI 66.58 kg/m   Wt Readings from Last 3 Encounters:  02/17/22 (!) 364 lb (165.1 kg)  01/18/22 (!) 364 lb (165.1 kg)  01/17/22 (!) 354 lb 15.1 oz (161 kg)    Physical Exam Vitals and nursing note reviewed.  Constitutional:      General: She is not in acute distress.    Appearance: Normal appearance. She is obese. She is not ill-appearing, toxic-appearing or diaphoretic.  HENT:     Head:  Normocephalic and atraumatic.     Right Ear: External ear normal.     Left Ear: External ear normal.     Nose: Nose normal.     Mouth/Throat:     Mouth: Mucous membranes are moist.     Pharynx: Oropharynx is clear.  Eyes:     General: No scleral icterus.       Right eye: No discharge.        Left eye: No discharge.     Extraocular Movements: Extraocular movements intact.     Conjunctiva/sclera: Conjunctivae normal.     Pupils: Pupils are equal, round, and reactive to light.  Cardiovascular:     Rate and Rhythm: Normal rate and regular rhythm.     Pulses: Normal pulses.     Heart sounds: Normal heart sounds. No murmur heard.    No friction rub. No gallop.  Pulmonary:     Effort: Pulmonary effort is normal. No respiratory distress.     Breath sounds: Normal breath sounds. No stridor. No wheezing, rhonchi or rales.  Chest:     Chest wall: No tenderness.  Musculoskeletal:        General: Normal range of motion.     Cervical back: Normal range of motion and neck supple.  Skin:    General: Skin is warm and dry.  Capillary Refill: Capillary refill takes less than 2 seconds.     Coloration: Skin is not jaundiced or pale.     Findings: No bruising, erythema, lesion or rash.     Comments: 1 inch shallow well healing wound on anterior L leg, no redness, heat or tenderness  Neurological:     General: No focal deficit present.     Mental Status: She is alert and oriented to person, place, and time. Mental status is at baseline.  Psychiatric:        Mood and Affect: Mood normal.        Behavior: Behavior normal.        Thought Content: Thought content normal.        Judgment: Judgment normal.     Results for orders placed or performed in visit on 12/23/21  Urine Culture   Specimen: Urine   UR  Result Value Ref Range   Urine Culture, Routine Final report (A)    Organism ID, Bacteria Escherichia coli (A)    ORGANISM ID, BACTERIA Comment    Antimicrobial Susceptibility Comment    Microscopic Examination   Urine  Result Value Ref Range   WBC, UA >30W 0 - 5 /hpf   RBC, Urine 3-10 (A) 0 - 2 /hpf   Epithelial Cells (non renal) 0-10 0 - 10 /hpf   Bacteria, UA Moderate (A) None seen/Few   Yeast, UA Present (A) None seen  Urinalysis, Routine w reflex microscopic  Result Value Ref Range   Specific Gravity, UA 1.025 1.005 - 1.030   pH, UA 5.5 5.0 - 7.5   Color, UA Yellow Yellow   Appearance Ur Turbid (A) Clear   Leukocytes,UA 3+ (A) Negative   Protein,UA Trace (A) Negative/Trace   Glucose, UA Negative Negative   Ketones, UA Negative Negative   RBC, UA 3+ (A) Negative   Bilirubin, UA Negative Negative   Urobilinogen, Ur 0.2 0.2 - 1.0 mg/dL   Nitrite, UA Positive (A) Negative   Microscopic Examination See below:   Bayer DCA Hb A1c Waived  Result Value Ref Range   HB A1C (BAYER DCA - WAIVED) 7.1 (H) 4.8 - 5.6 %  Hepatitis C Antibody  Result Value Ref Range   Hep C Virus Ab Non Reactive Non Reactive      Assessment & Plan:   Problem List Items Addressed This Visit   None Visit Diagnoses     Cellulitis of anterior lower leg    -  Primary   Resolved. No more redness or heat   Laceration of left lower extremity, subsequent encounter       Healing well. Almost healed. Will get her back in 1 month to recheck        Follow up plan: Return as scheduled.

## 2022-02-28 IMAGING — MG MM DIGITAL DIAGNOSTIC UNILAT*L* W/ TOMO W/ CAD
4 series · 4 of 12 positions shown · non-contrast
Comparison: Previous exam(s).

CLINICAL DATA: Postprocedure mammogram

EXAM:
DIAGNOSTIC LEFT MAMMOGRAM POST ULTRASOUND-GUIDED RADIOFREQUENCY TAG
PLACEMENT

[L CC synth-2D]
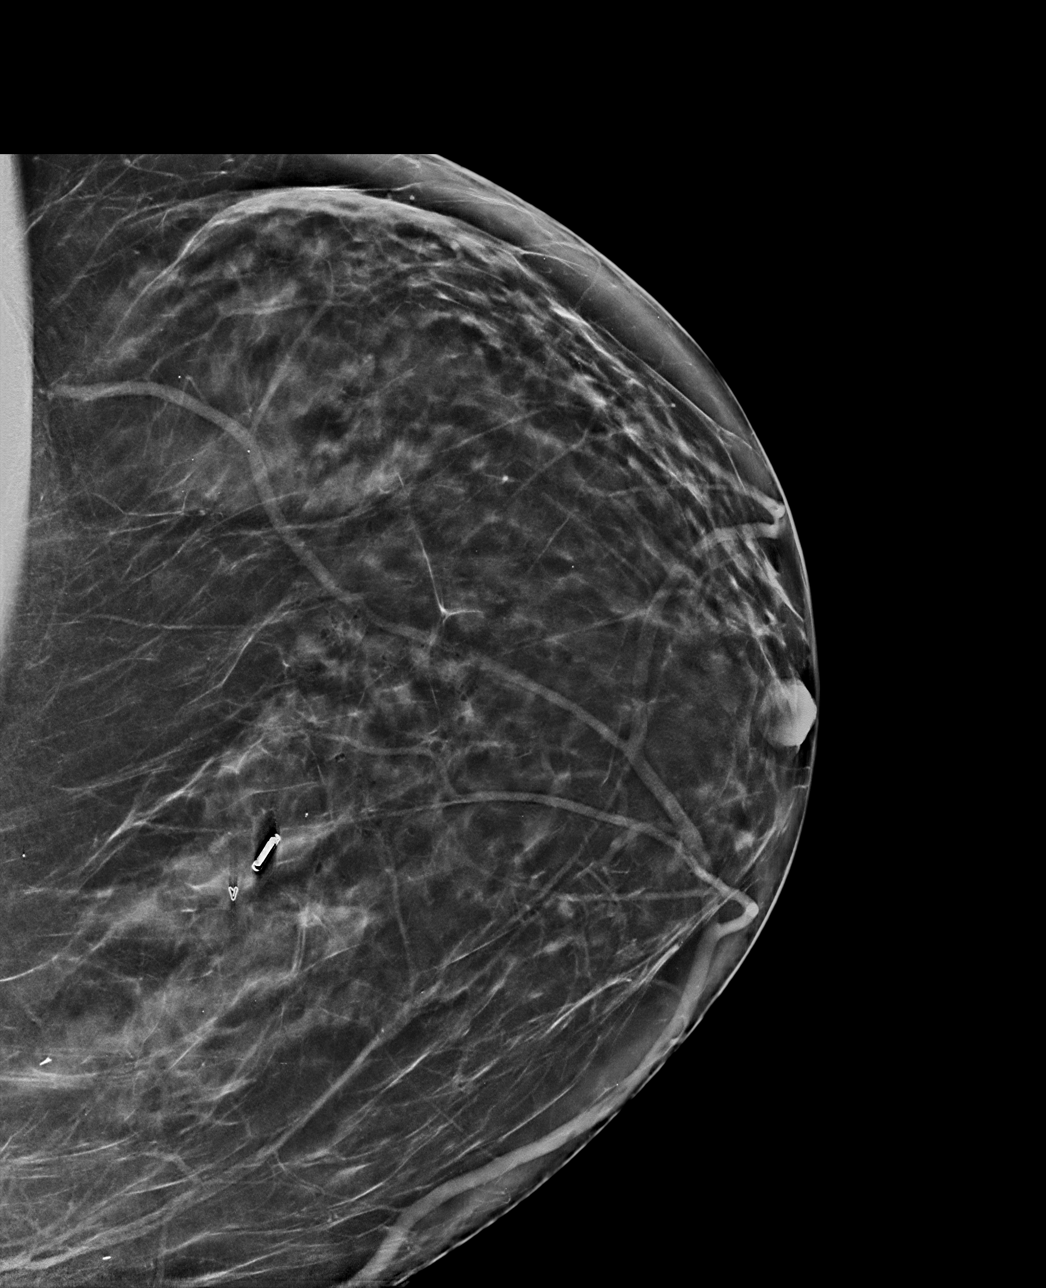

[L ML synth-2D]
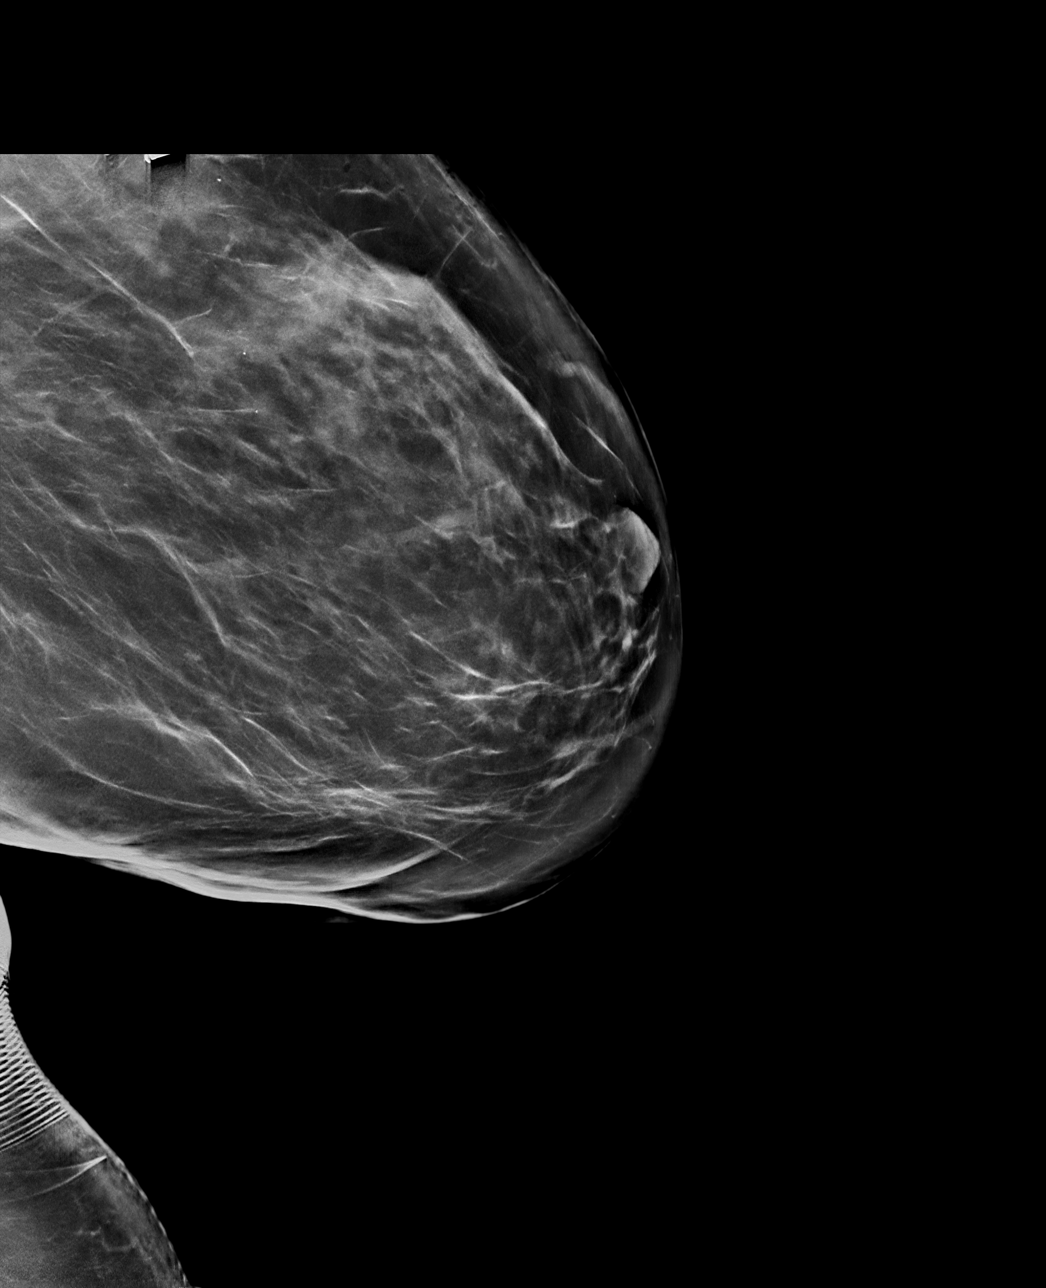

[L ML tomo · tomo slice 55/109.0]
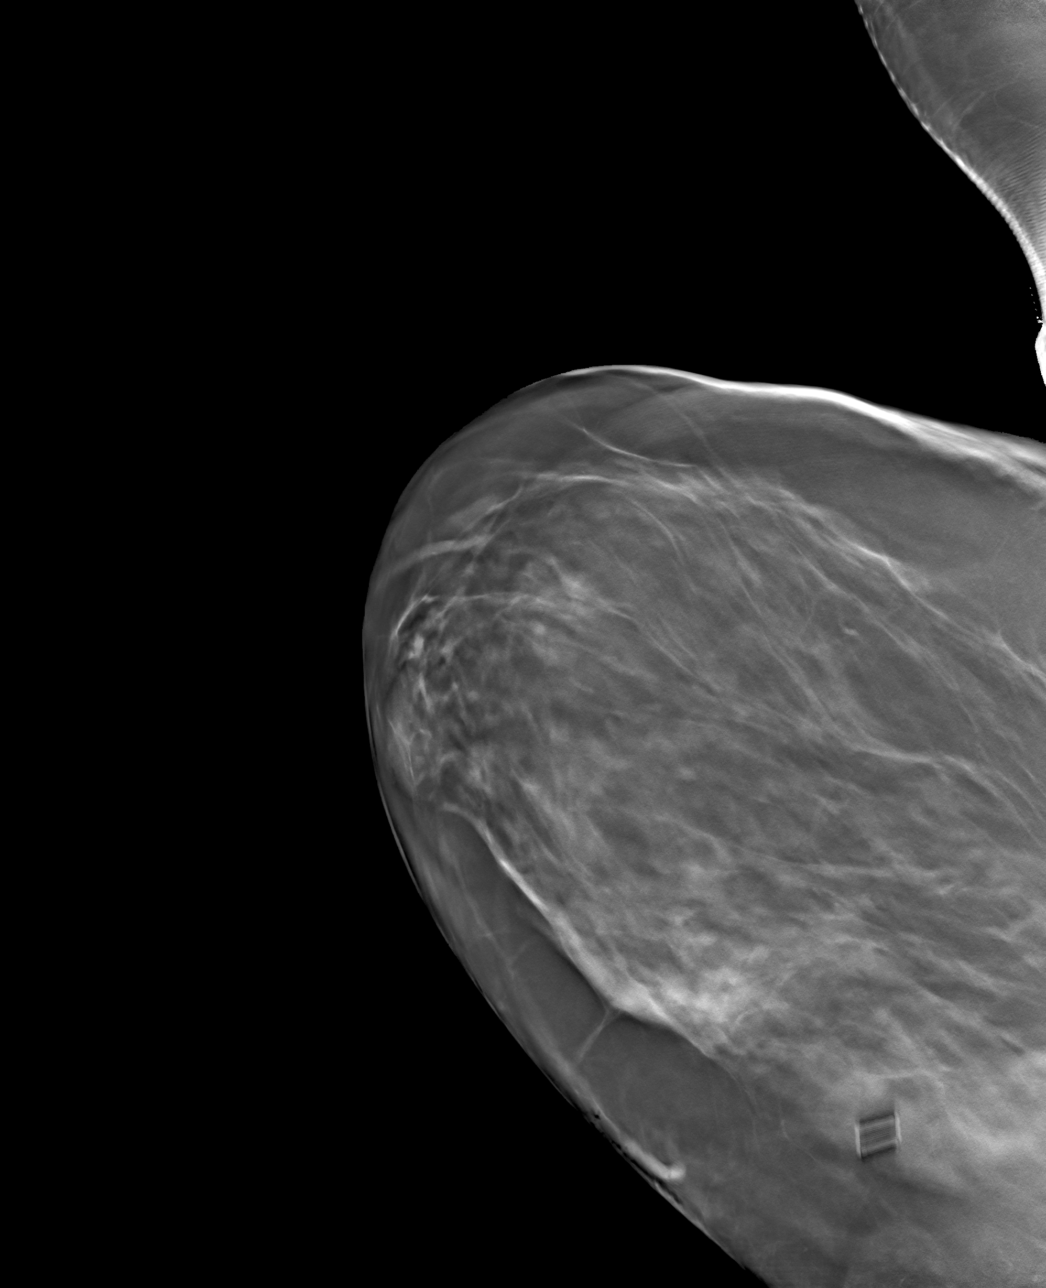

[L CC tomo · tomo slice 39/78.0]
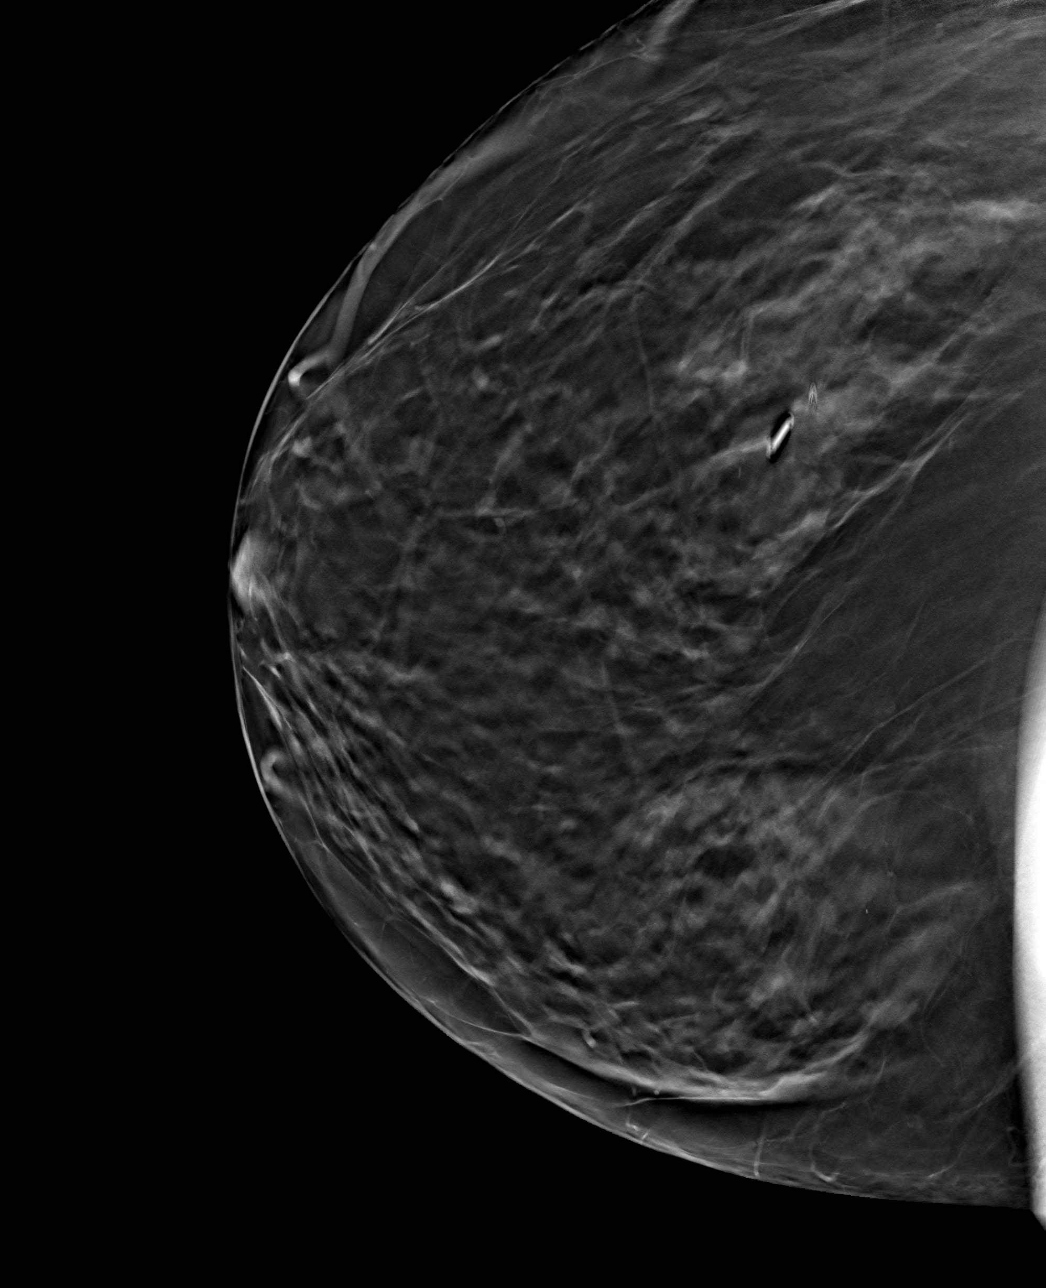

[4 of 12 positions shown; findings below may reference images not displayed]

FINDINGS: Mammographic images were obtained following ultrasound-guided
radiofrequency tag placement. These demonstrate the RF tag in
appropriate location, approximately 5 mm anterolateral to the heart
shaped biopsy clip, in the upper inner left breast at middle depth.
IMPRESSION: Appropriate location of the radiofrequency tag.

Final Assessment: Post Procedure Mammograms for RF tag placement

## 2022-03-01 IMAGING — MG MM BREAST SURGICAL SPECIMEN
1 series · 1 of 1 positions shown · non-contrast
Comparison: Previous exam(s).

CLINICAL DATA: Status post radiofrequency tag localized LEFT breast
lumpectomy.

EXAM:
SPECIMEN RADIOGRAPH OF THE LEFT BREAST

[L]
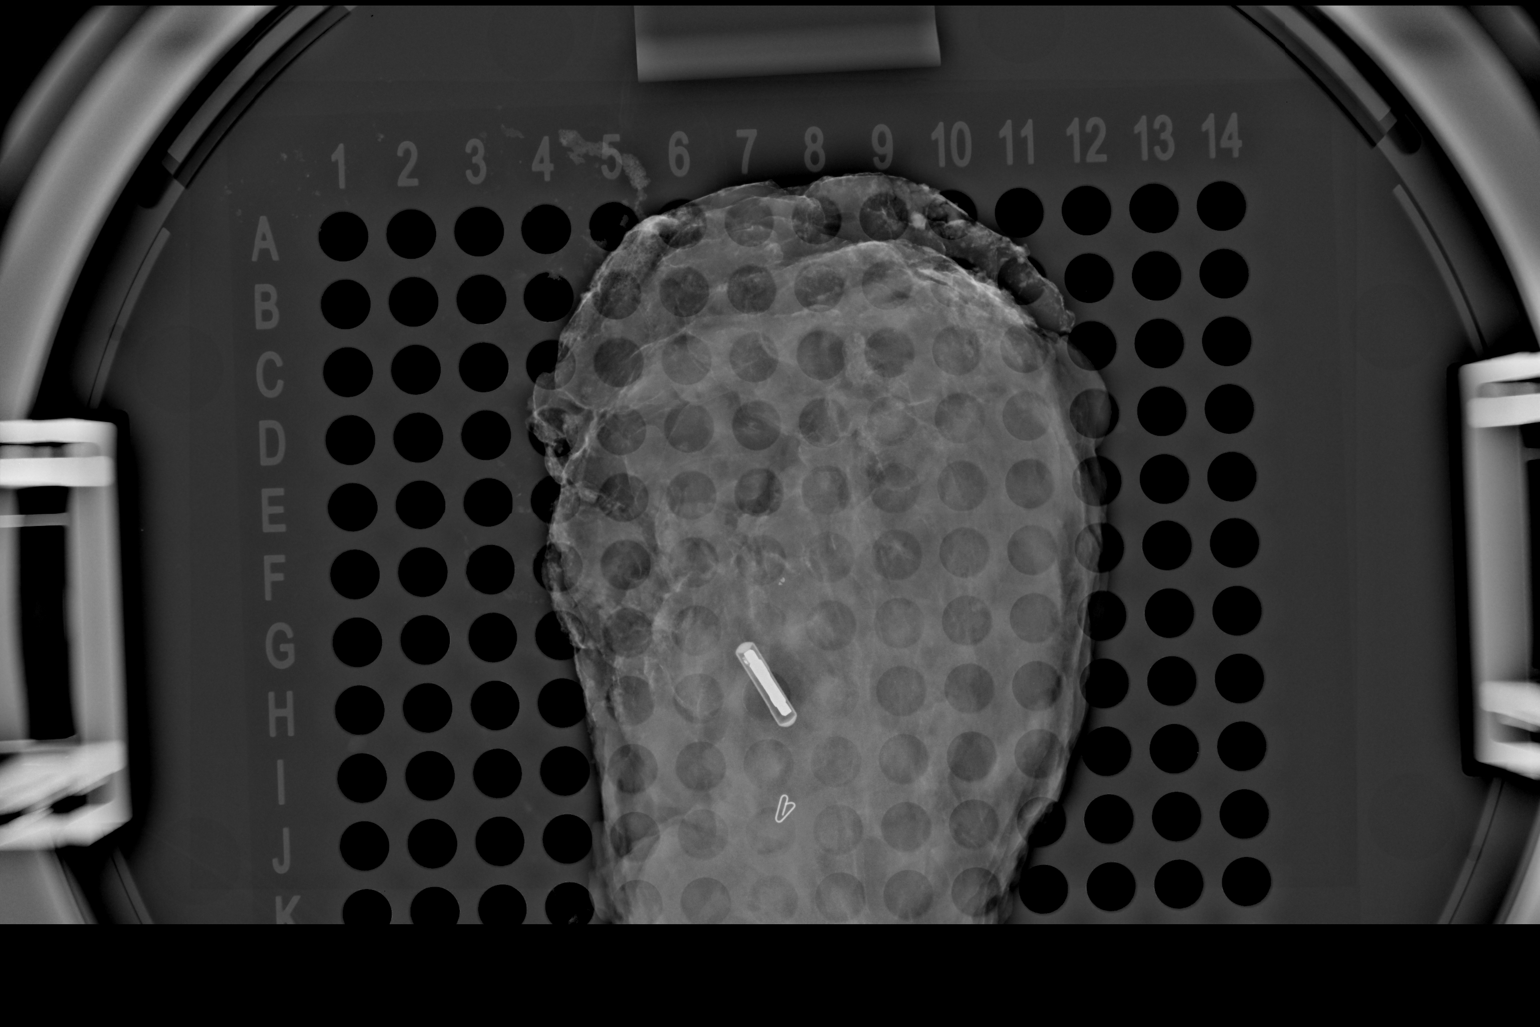

[1 of 1 positions shown; findings below may reference images not displayed]

FINDINGS: Status post excision of the LEFT breast. The radiofrequency tag and
heart shaped clip are present within the specimen.
IMPRESSION: Specimen radiograph of the LEFT breast.

## 2022-03-02 ENCOUNTER — Other Ambulatory Visit: Payer: Self-pay | Admitting: Oncology

## 2022-03-04 ENCOUNTER — Encounter: Payer: Self-pay | Admitting: Radiation Oncology

## 2022-03-04 ENCOUNTER — Ambulatory Visit
Admission: RE | Admit: 2022-03-04 | Discharge: 2022-03-04 | Disposition: A | Discharge status: Home / Self Care | Payer: Medicare Other | Source: Ambulatory Visit | Attending: Radiation Oncology | Admitting: Radiation Oncology

## 2022-03-04 VITALS — BP 126/73 | HR 82 | Temp 99.5°F | Resp 16

## 2022-03-04 DIAGNOSIS — Z923 Personal history of irradiation: Secondary | ICD-10-CM | POA: Diagnosis not present

## 2022-03-04 DIAGNOSIS — C50212 Malignant neoplasm of upper-inner quadrant of left female breast: Secondary | ICD-10-CM | POA: Insufficient documentation

## 2022-03-04 DIAGNOSIS — Z79811 Long term (current) use of aromatase inhibitors: Secondary | ICD-10-CM | POA: Insufficient documentation

## 2022-03-04 DIAGNOSIS — Z170 Estrogen receptor positive status [ER+]: Secondary | ICD-10-CM | POA: Diagnosis not present

## 2022-03-04 NOTE — Progress Notes (Signed)
Radiation Oncology Follow up Note  Name: Sharon Ware   Date:   03/04/2022 MRN:  [id number] DOB: Jan 12, 1951    This 71 y.o. female presents to the clinic today for 1 year follow-up status post whole breast radiation to her left breast for stage Ia ER/PR positive carcinoma.  REFERRING PROVIDER: Valerie Roys, DO  HPI: Patient is a 71 year old female now out 1 year having completed whole breast radiation to her left breast for stage Ia ER/PR positive tubular carcinoma.  Seen today in routine follow-up she is doing well.  She specifically denies breast tenderness cough or bone pain..  She had mammograms back in August which I have reviewed were BI-RADS 2 benign.  She is currently on letrozole tolerating it well without side effect.  COMPLICATIONS OF TREATMENT: none  FOLLOW UP COMPLIANCE: keeps appointments   PHYSICAL EXAM:  BP 126/73   Pulse 82   Temp 99.5 F (37.5 C)   Resp 16  Lungs are clear to A&P cardiac examination essentially unremarkable with regular rate and rhythm. No dominant mass or nodularity is noted in either breast in 2 positions examined. Incision is well-healed. No axillary or supraclavicular adenopathy is appreciated. Cosmetic result is excellent.  Well-developed well-nourished patient in NAD. HEENT reveals PERLA, EOMI, discs not visualized.  Oral cavity is clear. No oral mucosal lesions are identified. Neck is clear without evidence of cervical or supraclavicular adenopathy. Lungs are clear to A&P. Cardiac examination is essentially unremarkable with regular rate and rhythm without murmur rub or thrill. Abdomen is benign with no organomegaly or masses noted. Motor sensory and DTR levels are equal and symmetric in the upper and lower extremities. Cranial nerves II through XII are grossly intact. Proprioception is intact. No peripheral adenopathy or edema is identified. No motor or sensory levels are noted. Crude visual fields are within normal range.  RADIOLOGY  RESULTS: Mammograms reviewed compatible with above-stated findings  PLAN: Present time patient is doing well with no evidence of disease now out 1 year.  I am pleased with her overall progress.  I have asked to see her back in 1 year for follow-up.  Patient is to call sooner with any concerns.  I would like to take this opportunity to thank you for allowing me to participate in the care of your patient.[name], MD

## 2022-03-25 ENCOUNTER — Ambulatory Visit (INDEPENDENT_AMBULATORY_CARE_PROVIDER_SITE_OTHER): Payer: Medicare Other | Admitting: Family Medicine

## 2022-03-25 ENCOUNTER — Encounter: Payer: Self-pay | Admitting: Family Medicine

## 2022-03-25 VITALS — BP 100/68 | HR 90 | Temp 98.8°F | Wt 364.0 lb

## 2022-03-25 DIAGNOSIS — E1159 Type 2 diabetes mellitus with other circulatory complications: Secondary | ICD-10-CM

## 2022-03-25 DIAGNOSIS — E782 Mixed hyperlipidemia: Secondary | ICD-10-CM

## 2022-03-25 DIAGNOSIS — E118 Type 2 diabetes mellitus with unspecified complications: Secondary | ICD-10-CM

## 2022-03-25 DIAGNOSIS — I152 Hypertension secondary to endocrine disorders: Secondary | ICD-10-CM | POA: Diagnosis not present

## 2022-03-25 LAB — BAYER DCA HB A1C WAIVED: HB A1C (BAYER DCA - WAIVED): 7.5 % — ABNORMAL HIGH (ref 4.8–5.6)

## 2022-03-25 MED ORDER — SPIRONOLACTONE 50 MG PO TABS: 50.0000 mg | ORAL_TABLET | Freq: Every day | ORAL | 1 refills | Status: DC

## 2022-03-25 MED ORDER — LOSARTAN POTASSIUM-HCTZ 50-12.5 MG PO TABS: 1.0000 | ORAL_TABLET | Freq: Every day | ORAL | 1 refills | Status: DC

## 2022-03-25 MED ORDER — DAPAGLIFLOZIN PROPANEDIOL 10 MG PO TABS: 10.0000 mg | ORAL_TABLET | Freq: Every day | ORAL | 3 refills | Status: DC

## 2022-03-25 NOTE — Assessment & Plan Note (Signed)
Under good control on current regimen. Continue current regimen. Continue to monitor. Call with any concerns. Refills given. Labs drawn today.   

## 2022-03-25 NOTE — Progress Notes (Signed)
BP 100/68   Pulse 90   Temp 98.8 F (37.1 C) (Oral)   Wt (!) 364 lb (165.1 kg)   SpO2 91%   BMI 66.58 kg/m    Subjective:    Patient ID: Sharon Ware, female    DOB: Jul 18, 1950, 71 y.o.   MRN: [id number]  HPI: Sharon Ware is a 71 y.o. female  Chief Complaint  Patient presents with   Diabetes   Leg Problem    Patient says she would like the provider to take a look at her leg.    HYPERTENSION / HYPERLIPIDEMIA Satisfied with current treatment? yes Duration of hypertension: chronic BP monitoring frequency: not checking BP medication side effects: no Past BP meds: spironalactone, losartan, HCTZ Duration of hyperlipidemia: chronic Cholesterol medication side effects: no Cholesterol supplements: none Past cholesterol medications: lovastatin Medication compliance: excellent compliance Aspirin: yes Recent stressors: no Recurrent headaches: no Visual changes: no Palpitations: no Dyspnea: no Chest pain: no Lower extremity edema: no Dizzy/lightheaded: no  DIABETES Hypoglycemic episodes:no Polydipsia/polyuria: no Visual disturbance: no Chest pain: no Paresthesias: no Glucose Monitoring: no  Accucheck frequency: Not Checking Taking Insulin?: no Blood Pressure Monitoring: not checking Retinal Examination: Up to Date Foot Exam: Up to Date Diabetic Education: Not Completed Pneumovax: Up to Date Influenza: Up to Date Aspirin: yes   Relevant past medical, surgical, family and social history reviewed and updated as indicated. Interim medical history since our last visit reviewed. Allergies and medications reviewed and updated.  Review of Systems  Respiratory: Negative.    Cardiovascular: Negative.   Gastrointestinal: Negative.   Musculoskeletal: Negative.   Psychiatric/Behavioral: Negative.      Per HPI unless specifically indicated above     Objective:    BP 100/68   Pulse 90   Temp 98.8 F (37.1 C) (Oral)   Wt (!) 364 lb (165.1 kg)   SpO2  91%   BMI 66.58 kg/m   Wt Readings from Last 3 Encounters:  03/25/22 (!) 364 lb (165.1 kg)  02/17/22 (!) 364 lb (165.1 kg)  01/18/22 (!) 364 lb (165.1 kg)    Physical Exam Vitals and nursing note reviewed.  Constitutional:      General: She is not in acute distress.    Appearance: Normal appearance. She is not ill-appearing, toxic-appearing or diaphoretic.  HENT:     Head: Normocephalic and atraumatic.     Right Ear: External ear normal.     Left Ear: External ear normal.     Nose: Nose normal.     Mouth/Throat:     Mouth: Mucous membranes are moist.     Pharynx: Oropharynx is clear.  Eyes:     General: No scleral icterus.       Right eye: No discharge.        Left eye: No discharge.     Extraocular Movements: Extraocular movements intact.     Conjunctiva/sclera: Conjunctivae normal.     Pupils: Pupils are equal, round, and reactive to light.  Cardiovascular:     Rate and Rhythm: Normal rate and regular rhythm.     Pulses: Normal pulses.     Heart sounds: Normal heart sounds. No murmur heard.    No friction rub. No gallop.  Pulmonary:     Effort: Pulmonary effort is normal. No respiratory distress.     Breath sounds: Normal breath sounds. No stridor. No wheezing, rhonchi or rales.  Chest:     Chest wall: No tenderness.  Musculoskeletal:  General: Normal range of motion.     Cervical back: Normal range of motion and neck supple.  Skin:    General: Skin is warm and dry.     Capillary Refill: Capillary refill takes less than 2 seconds.     Coloration: Skin is not jaundiced or pale.     Findings: No bruising, erythema, lesion or rash.  Neurological:     General: No focal deficit present.     Mental Status: She is alert and oriented to person, place, and time. Mental status is at baseline.  Psychiatric:        Mood and Affect: Mood normal.        Behavior: Behavior normal.        Thought Content: Thought content normal.        Judgment: Judgment normal.      Results for orders placed or performed in visit on 03/25/22  Bayer DCA Hb A1c Waived  Result Value Ref Range   HB A1C (BAYER DCA - WAIVED) 7.5 (H) 4.8 - 5.6 %      Assessment & Plan:   Problem List Items Addressed This Visit       Cardiovascular and Mediastinum   Hypertension associated with diabetes ([place])    Under good control on current regimen. Continue current regimen. Continue to monitor. Call with any concerns. Refills given. Labs drawn today.       Relevant Medications   losartan-hydrochlorothiazide (HYZAAR) 50-12.5 MG tablet   spironolactone (ALDACTONE) 50 MG tablet   dapagliflozin propanediol (FARXIGA) 10 MG TABS tablet     Endocrine   Controlled diabetes mellitus type 2 with complications ([place]) - Primary    Up slightly with A1c of 7.1 from 6.7. Will start farxiga and recheck 3 months. Call with any concerns. Continue to monitor.      Relevant Medications   losartan-hydrochlorothiazide (HYZAAR) 50-12.5 MG tablet   dapagliflozin propanediol (FARXIGA) 10 MG TABS tablet   Other Relevant Orders   Bayer DCA Hb A1c Waived (Completed)   CBC with Differential/Platelet   Comprehensive metabolic panel   Lipid Panel w/o Chol/HDL Ratio     Other   Hyperlipidemia    Under good control on current regimen. Continue current regimen. Continue to monitor. Call with any concerns. Refills given. Labs drawn today.      Relevant Medications   losartan-hydrochlorothiazide (HYZAAR) 50-12.5 MG tablet   spironolactone (ALDACTONE) 50 MG tablet     Follow up plan: Return in about 3 months (around 06/24/2022).

## 2022-03-25 NOTE — Assessment & Plan Note (Signed)
Up slightly with A1c of 7.1 from 6.7. Will start farxiga and recheck 3 months. Call with any concerns. Continue to monitor.

## 2022-03-26 LAB — COMPREHENSIVE METABOLIC PANEL
ALT: 28 IU/L (ref 0–32)
AST: 19 IU/L (ref 0–40)
Albumin/Globulin Ratio: 1.4 (ref 1.2–2.2)
Albumin: 4.1 g/dL (ref 3.8–4.8)
Alkaline Phosphatase: 60 IU/L (ref 44–121)
BUN/Creatinine Ratio: 30 — ABNORMAL HIGH (ref 12–28)
BUN: 23 mg/dL (ref 8–27)
Bilirubin Total: 0.4 mg/dL (ref 0.0–1.2)
CO2: 26 mmol/L (ref 20–29)
Calcium: 10.2 mg/dL (ref 8.7–10.3)
Chloride: 97 mmol/L (ref 96–106)
Creatinine, Ser: 0.76 mg/dL (ref 0.57–1.00)
Globulin, Total: 2.9 g/dL (ref 1.5–4.5)
Glucose: 148 mg/dL — ABNORMAL HIGH (ref 70–99)
Potassium: 4.4 mmol/L (ref 3.5–5.2)
Sodium: 139 mmol/L (ref 134–144)
Total Protein: 7 g/dL (ref 6.0–8.5)
eGFR: 83 mL/min/{1.73_m2} (ref >59)

## 2022-03-26 LAB — CBC WITH DIFFERENTIAL/PLATELET
Basophils Absolute: 0 10*3/uL (ref 0.0–0.2)
Basos: 0 %
EOS (ABSOLUTE): 0.2 10*3/uL (ref 0.0–0.4)
Eos: 3 %
Hematocrit: 42.5 % (ref 34.0–46.6)
Hemoglobin: 14.2 g/dL (ref 11.1–15.9)
Immature Grans (Abs): 0 10*3/uL (ref 0.0–0.1)
Immature Granulocytes: 1 %
Lymphocytes Absolute: 2.7 10*3/uL (ref 0.7–3.1)
Lymphs: 43 %
MCH: 30.1 pg (ref 26.6–33.0)
MCHC: 33.4 g/dL (ref 31.5–35.7)
MCV: 90 fL (ref 79–97)
Monocytes Absolute: 0.5 10*3/uL (ref 0.1–0.9)
Monocytes: 7 %
Neutrophils Absolute: 2.8 10*3/uL (ref 1.4–7.0)
Neutrophils: 46 %
Platelets: 212 10*3/uL (ref 150–450)
RBC: 4.71 x10E6/uL (ref 3.77–5.28)
RDW: 12.2 % (ref 11.7–15.4)
WBC: 6.2 10*3/uL (ref 3.4–10.8)

## 2022-03-26 LAB — LIPID PANEL W/O CHOL/HDL RATIO
Cholesterol, Total: 146 mg/dL (ref 100–199)
HDL: 49 mg/dL (ref >39)
LDL Chol Calc (NIH): 69 mg/dL (ref 0–99)
Triglycerides: 166 mg/dL — ABNORMAL HIGH (ref 0–149)
VLDL Cholesterol Cal: 28 mg/dL (ref 5–40)

## 2022-04-07 ENCOUNTER — Encounter: Payer: Self-pay | Admitting: Family Medicine

## 2022-04-10 ENCOUNTER — Encounter: Payer: Self-pay | Admitting: Oncology

## 2022-04-12 ENCOUNTER — Other Ambulatory Visit: Payer: Self-pay

## 2022-04-12 MED ORDER — LETROZOLE 2.5 MG PO TABS: 2.5000 mg | ORAL_TABLET | Freq: Every day | ORAL | 1 refills | Status: DC

## 2022-04-13 MED ORDER — LOSARTAN POTASSIUM-HCTZ 50-12.5 MG PO TABS: 1.0000 | ORAL_TABLET | Freq: Every day | ORAL | 1 refills | Status: DC

## 2022-04-13 MED ORDER — DAPAGLIFLOZIN PROPANEDIOL 10 MG PO TABS: 10.0000 mg | ORAL_TABLET | Freq: Every day | ORAL | 3 refills | Status: DC

## 2022-04-13 MED ORDER — LOVASTATIN 20 MG PO TABS: ORAL_TABLET | ORAL | 3 refills | Status: DC

## 2022-04-13 MED ORDER — METFORMIN HCL ER 500 MG PO TB24: 1000.0000 mg | ORAL_TABLET | Freq: Two times a day (BID) | ORAL | 1 refills | Status: DC

## 2022-04-13 MED ORDER — SPIRONOLACTONE 50 MG PO TABS: 50.0000 mg | ORAL_TABLET | Freq: Every day | ORAL | 1 refills | Status: DC

## 2022-05-18 ENCOUNTER — Other Ambulatory Visit: Payer: Self-pay | Admitting: Family Medicine

## 2022-05-19 NOTE — Telephone Encounter (Signed)
Requested Prescriptions  Pending Prescriptions Disp Refills   nitrofurantoin, macrocrystal-monohydrate, (MACROBID) 100 MG capsule [Pharmacy Med Name: NITROFURANTOIN MONO-MCR 100 MG] 14 capsule 0    Sig: TAKE 1 CAPSULE BY MOUTH TWICE A DAY     Off-Protocol Failed - 05/18/2022  2:33 PM      Failed - Medication not assigned to a protocol, review manually.      Passed - Valid encounter within last 12 months    Recent Outpatient Visits           1 month ago Controlled type 2 diabetes mellitus with complication, without long-term current use of insulin ([place])   [place], Megan P, DO   3 months ago Cellulitis of anterior lower leg   [place] P, DO   4 months ago Controlled type 2 diabetes mellitus with complication, without long-term current use of insulin ([place])   [place], Megan P, DO   7 months ago Controlled type 2 diabetes mellitus with complication, without long-term current use of insulin [facility])   [place], Megan P, DO   11 months ago Diabetes mellitus without complication [facility])   [place], Megan P, DO       Future Appointments             In 1 month Johnson, Barb Merino, DO [place], [facility]

## 2022-06-24 ENCOUNTER — Ambulatory Visit: Payer: Medicare Other | Admitting: Family Medicine

## 2022-07-13 ENCOUNTER — Encounter: Payer: Self-pay | Admitting: Family Medicine

## 2022-07-13 ENCOUNTER — Ambulatory Visit (INDEPENDENT_AMBULATORY_CARE_PROVIDER_SITE_OTHER): Payer: Medicare Other | Admitting: Family Medicine

## 2022-07-13 VITALS — BP 130/80 | HR 87 | Temp 99.0°F

## 2022-07-13 DIAGNOSIS — E118 Type 2 diabetes mellitus with unspecified complications: Secondary | ICD-10-CM

## 2022-07-13 LAB — BAYER DCA HB A1C WAIVED: HB A1C (BAYER DCA - WAIVED): 7.4 % — ABNORMAL HIGH (ref 4.8–5.6)

## 2022-07-13 MED ORDER — DAPAGLIFLOZIN PROPANEDIOL 10 MG PO TABS: 10.0000 mg | ORAL_TABLET | Freq: Every day | ORAL | 1 refills | Status: DC

## 2022-07-13 NOTE — Progress Notes (Signed)
BP 130/80   Pulse 87   Temp 99 F (37.2 C) (Oral)   SpO2 90%    Subjective:    Patient ID: Sharon Ware, female    DOB: 02/15/1951, 72 y.o.   MRN: [id number]  HPI: Sharon Ware is a 72 y.o. female  Chief Complaint  Patient presents with   Diabetes   DIABETES Hypoglycemic episodes:no Polydipsia/polyuria: no Visual disturbance: no Chest pain: no Paresthesias: no Glucose Monitoring: yes  Accucheck frequency: Not Checking Taking Insulin?: no Blood Pressure Monitoring: not checking Retinal Examination: Up to Date Foot Exam: Up to Date Diabetic Education: Completed Pneumovax: Up to Date Influenza: Up to Date Aspirin: no  Relevant past medical, surgical, family and social history reviewed and updated as indicated. Interim medical history since our last visit reviewed. Allergies and medications reviewed and updated.  Review of Systems  Constitutional: Negative.   Respiratory: Negative.    Cardiovascular: Negative.   Gastrointestinal: Negative.   Musculoskeletal: Negative.   Psychiatric/Behavioral: Negative.      Per HPI unless specifically indicated above     Objective:    BP 130/80   Pulse 87   Temp 99 F (37.2 C) (Oral)   SpO2 90%   Wt Readings from Last 3 Encounters:  03/25/22 (!) 364 lb (165.1 kg)  02/17/22 (!) 364 lb (165.1 kg)  01/18/22 (!) 364 lb (165.1 kg)    Physical Exam Vitals and nursing note reviewed.  Constitutional:      General: She is not in acute distress.    Appearance: Normal appearance. She is obese. She is not ill-appearing, toxic-appearing or diaphoretic.  HENT:     Head: Normocephalic and atraumatic.     Right Ear: External ear normal.     Left Ear: External ear normal.     Nose: Nose normal.     Mouth/Throat:     Mouth: Mucous membranes are moist.     Pharynx: Oropharynx is clear.  Eyes:     General: No scleral icterus.       Right eye: No discharge.        Left eye: No discharge.     Extraocular Movements:  Extraocular movements intact.     Conjunctiva/sclera: Conjunctivae normal.     Pupils: Pupils are equal, round, and reactive to light.  Cardiovascular:     Rate and Rhythm: Normal rate and regular rhythm.     Pulses: Normal pulses.     Heart sounds: Normal heart sounds. No murmur heard.    No friction rub. No gallop.  Pulmonary:     Effort: Pulmonary effort is normal. No respiratory distress.     Breath sounds: Normal breath sounds. No stridor. No wheezing, rhonchi or rales.  Chest:     Chest wall: No tenderness.  Musculoskeletal:        General: Normal range of motion.     Cervical back: Normal range of motion and neck supple.  Skin:    General: Skin is warm and dry.     Capillary Refill: Capillary refill takes less than 2 seconds.     Coloration: Skin is not jaundiced or pale.     Findings: No bruising, erythema, lesion or rash.  Neurological:     General: No focal deficit present.     Mental Status: She is alert and oriented to person, place, and time. Mental status is at baseline.  Psychiatric:        Mood and Affect: Mood normal.  Behavior: Behavior normal.        Thought Content: Thought content normal.        Judgment: Judgment normal.     Results for orders placed or performed in visit on 03/25/22  Bayer DCA Hb A1c Waived  Result Value Ref Range   HB A1C (BAYER DCA - WAIVED) 7.5 (H) 4.8 - 5.6 %  CBC with Differential/Platelet  Result Value Ref Range   WBC 6.2 3.4 - 10.8 x10E3/uL   RBC 4.71 3.77 - 5.28 x10E6/uL   Hemoglobin 14.2 11.1 - 15.9 g/dL   Hematocrit 51.7 61.6 - 46.6 %   MCV 90 79 - 97 fL   MCH 30.1 26.6 - 33.0 pg   MCHC 33.4 31.5 - 35.7 g/dL   RDW 07.3 71.0 - 62.6 %   Platelets 212 150 - 450 x10E3/uL   Neutrophils 46 Not Estab. %   Lymphs 43 Not Estab. %   Monocytes 7 Not Estab. %   Eos 3 Not Estab. %   Basos 0 Not Estab. %   Neutrophils Absolute 2.8 1.4 - 7.0 x10E3/uL   Lymphocytes Absolute 2.7 0.7 - 3.1 x10E3/uL   Monocytes Absolute 0.5  0.1 - 0.9 x10E3/uL   EOS (ABSOLUTE) 0.2 0.0 - 0.4 x10E3/uL   Basophils Absolute 0.0 0.0 - 0.2 x10E3/uL   Immature Granulocytes 1 Not Estab. %   Immature Grans (Abs) 0.0 0.0 - 0.1 x10E3/uL  Comprehensive metabolic panel  Result Value Ref Range   Glucose 148 (H) 70 - 99 mg/dL   BUN 23 8 - 27 mg/dL   Creatinine, Ser 9.48 0.57 - 1.00 mg/dL   eGFR 83 >54 OE/VOJ/5.00   BUN/Creatinine Ratio 30 (H) 12 - 28   Sodium 139 134 - 144 mmol/L   Potassium 4.4 3.5 - 5.2 mmol/L   Chloride 97 96 - 106 mmol/L   CO2 26 20 - 29 mmol/L   Calcium 10.2 8.7 - 10.3 mg/dL   Total Protein 7.0 6.0 - 8.5 g/dL   Albumin 4.1 3.8 - 4.8 g/dL   Globulin, Total 2.9 1.5 - 4.5 g/dL   Albumin/Globulin Ratio 1.4 1.2 - 2.2   Bilirubin Total 0.4 0.0 - 1.2 mg/dL   Alkaline Phosphatase 60 44 - 121 IU/L   AST 19 0 - 40 IU/L   ALT 28 0 - 32 IU/L  Lipid Panel w/o Chol/HDL Ratio  Result Value Ref Range   Cholesterol, Total 146 100 - 199 mg/dL   Triglycerides 938 (H) 0 - 149 mg/dL   HDL 49 >18 mg/dL   VLDL Cholesterol Cal 28 5 - 40 mg/dL   LDL Chol Calc (NIH) 69 0 - 99 mg/dL      Assessment & Plan:   Problem List Items Addressed This Visit       Endocrine   Controlled diabetes mellitus type 2 with complications - Primary    Improved with A1c down to 7.4 from 7.5. Had a lot of diarrhea with the start of farxiga, now resolved. Will continue current regimen and recheck 3 months. Call with any concerns. Continue to monitor.       Relevant Medications   dapagliflozin propanediol (FARXIGA) 10 MG TABS tablet   Other Relevant Orders   Bayer DCA Hb A1c Waived     Follow up plan: Return in about 3 months (around 10/12/2022).

## 2022-07-13 NOTE — Assessment & Plan Note (Signed)
Improved with A1c down to 7.4 from 7.5. Had a lot of diarrhea with the start of farxiga, now resolved. Will continue current regimen and recheck 3 months. Call with any concerns. Continue to monitor.

## 2022-07-20 ENCOUNTER — Inpatient Hospital Stay: Payer: Medicare Other | Attending: Oncology | Admitting: Oncology

## 2022-07-20 ENCOUNTER — Ambulatory Visit: Payer: Medicare Other | Admitting: Oncology

## 2022-07-20 ENCOUNTER — Encounter: Payer: Self-pay | Admitting: Oncology

## 2022-07-20 VITALS — BP 130/79 | HR 86 | Temp 96.3°F | Resp 18 | Ht 63.0 in | Wt 355.0 lb

## 2022-07-20 DIAGNOSIS — Z08 Encounter for follow-up examination after completed treatment for malignant neoplasm: Secondary | ICD-10-CM

## 2022-07-20 DIAGNOSIS — C50212 Malignant neoplasm of upper-inner quadrant of left female breast: Secondary | ICD-10-CM | POA: Diagnosis present

## 2022-07-20 DIAGNOSIS — Z170 Estrogen receptor positive status [ER+]: Secondary | ICD-10-CM | POA: Insufficient documentation

## 2022-07-20 DIAGNOSIS — Z853 Personal history of malignant neoplasm of breast: Secondary | ICD-10-CM

## 2022-07-20 DIAGNOSIS — Z79811 Long term (current) use of aromatase inhibitors: Secondary | ICD-10-CM | POA: Diagnosis not present

## 2022-07-20 NOTE — Progress Notes (Signed)
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  Ultrasound showed a 1.2 x 0.8 x 0.7 cm mass 8 cm from the nipple at the 10:30 position.  No suspicious left axillary adenopathy.  This was biopsied and was consistent with invasive mammary carcinoma with tubular features grade 1.  No lymphovascular invasion identified.  ER/PR greater than 90% positive HER2 negative   Final pathology from lumpectomy showed tubular carcinoma grade 10 mm.  Multifocal superior margin positive.  Mutual decision between surgery radiation oncology and patient was to forego reexcision and proceed with adjuvant radiation treatment with radiation boost to the superior margin.   Patient completed adjuvant radiation treatment and started letrozole in November 2022.  Baseline bone density scan normal    Interval history-she is doing well on letrozole and reports no significant side effects.  Denies any breast concerns  ECOG PS- 2 Pain scale- 0  Review of systems- Review of Systems  Constitutional:   Negative for chills, fever, malaise/fatigue and weight loss.  HENT:  Negative for congestion, ear discharge and nosebleeds.   Eyes:  Negative for blurred vision.  Respiratory:  Negative for cough, hemoptysis, sputum production, shortness of breath and wheezing.   Cardiovascular:  Negative for chest pain, palpitations, orthopnea and claudication.  Gastrointestinal:  Negative for abdominal pain, blood in stool, constipation, diarrhea, heartburn, melena, nausea and vomiting.  Genitourinary:  Negative for dysuria, flank pain, frequency, hematuria and urgency.  Musculoskeletal:  Negative for back pain, joint pain and myalgias.  Skin:  Negative for rash.  Neurological:  Negative for dizziness, tingling, focal weakness, seizures, weakness and headaches.  Endo/Heme/Allergies:  Does not bruise/bleed easily.  Psychiatric/Behavioral:  Negative for depression and suicidal ideas. The patient does not have insomnia.       Allergies  Allergen Reactions   Other Anaphylaxis    Honey Dew Melon   Wound Dressing Adhesive Rash   Morphine And Related Other (See Comments)    Hypotension   Latex Rash     Past Medical History:  Diagnosis Date   Arthritis    Breast cancer    Cataract    Diabetes mellitus without complication    Hyperlipidemia    Hypertension    Vitamin D deficiency      Past Surgical History:  Procedure Laterality Date   ABDOMINAL HYSTERECTOMY     x2   BREAST BIOPSY Right 1990s   neg   BREAST BIOPSY Right 2000s   neg   BREAST BIOPSY Left 11/24/2020   u/s bx 10:30/8 cmfn-"heart" marker   BREAST EXCISIONAL BIOPSY Right    neg   BREAST LUMPECTOMY     BREAST LUMPECTOMY WITH RADIOFREQUENCY  TAG IDENTIFICATION Left 12/10/2020   Procedure: BREAST LUMPECTOMY WITH RADIOFREQUENCY TAG IDENTIFICATION;  Surgeon: Campbell Lerner, MD;  Location: [facility] ORS;  Service: General;  Laterality: Left;   DILATION AND CURETTAGE OF UTERUS     EYE SURGERY  2004   cataract surgery and laser surgery    Salpingo-oophorectomy      Social History   Socioeconomic History   Marital status: Married    Spouse name: Not on file   Number of children: Not on file   Years of education: Not on file   Highest education level: Not on file  Occupational History   Occupation: retired  Tobacco Use   Smoking status: Never   Smokeless tobacco: Never  Vaping Use   Vaping Use: Never used  Substance and Sexual Activity   Alcohol use: Not Currently   Drug use: Never   Sexual activity: Not Currently  Other Topics Concern   Not on file  Social History Narrative   Not on file   Social Determinants of Health   Financial Resource Strain: Low Risk  (10/26/2021)   Overall Financial Resource Strain (CARDIA)    Difficulty of Paying Living Expenses: Not hard at all  Food Insecurity: No Food Insecurity (10/26/2021)   Hunger Vital Sign    Worried About Running Out of Food in the Last Year: Never true    Ran Out of Food in the Last Year: Never true  Transportation Needs: No Transportation Needs (10/26/2021)   PRAPARE - Administrator, Civil Service (Medical): No    Lack of Transportation (Non-Medical): No  Physical Activity: Inactive (10/26/2021)   Exercise Vital Sign    Days of Exercise per Week: 0 days    Minutes of Exercise per Session: 0 min  Stress: No Stress Concern Present (10/26/2021)   [employer] of Occupational Health - Occupational Stress Questionnaire    Feeling of Stress : Not at all  Social Connections: Moderately Isolated (10/26/2021)   Social Connection and Isolation Panel [NHANES]    Frequency of Communication with Friends and Family: More than three times a week    Frequency of Social Gatherings with Friends and Family: More than three times a week    Attends Religious Services: Never    Database administrator or Organizations: No    Attends Banker Meetings: Never    Marital Status: Married  Catering manager Violence: Not At Risk (10/26/2021)    Humiliation, Afraid, Rape, and Kick questionnaire    Fear of Current or Ex-Partner: No    Emotionally Abused: No    Physically Abused: No    Sexually Abused: No    Family History  Problem Relation Age of Onset   Breast cancer Mother 75   Heart disease Father    Cancer Paternal Grandfather    Heart disease Paternal Grandfather      Current Outpatient Medications:    acetaminophen (TYLENOL) 500 MG tablet, Take 500 mg by mouth in the morning and at bedtime., Disp: , Rfl:    aspirin 81 MG tablet, Take 81 mg by mouth daily., Disp: , Rfl:    Calcium Citrate-Vitamin D (CALCIUM CITRATE + D3 PO), Take 1,200 mg by mouth daily., Disp: , Rfl:    Cholecalciferol (VITAMIN D3) 125 MCG (5000 UT) TABS, Take 5,000 Units by mouth daily., Disp: , Rfl:    dapagliflozin propanediol (FARXIGA) 10 MG TABS tablet, Take 1 tablet (10 mg total) by mouth daily before breakfast., Disp: 90 tablet, Rfl:  1   Glucosamine HCl 1500 MG TABS, Take 1,500 mg by mouth in the morning and at bedtime., Disp: , Rfl:    letrozole (FEMARA) 2.5 MG tablet, Take 1 tablet (2.5 mg total) by mouth daily., Disp: 90 tablet, Rfl: 1   losartan-hydrochlorothiazide (HYZAAR) 50-12.5 MG tablet, Take 1 tablet by mouth daily., Disp: 90 tablet, Rfl: 1   lovastatin (MEVACOR) 20 MG tablet, TAKE 1 TABLET BY MOUTH EVERYDAY AT BEDTIME, Disp: 90 tablet, Rfl: 3   Menthol, Topical Analgesic, (ICY HOT EX), Apply 1 application topically daily as needed (pain)., Disp: , Rfl:    metFORMIN (GLUCOPHAGE-XR) 500 MG 24 hr tablet, Take 2 tablets (1,000 mg total) by mouth 2 (two) times daily with a meal., Disp: 360 tablet, Rfl: 1   Omega-3 Fatty Acids (FISH OIL ULTRA) 1400 MG CAPS, Take 1,400 mg by mouth daily., Disp: , Rfl:    spironolactone (ALDACTONE) 50 MG tablet, Take 1 tablet (50 mg total) by mouth daily., Disp: 90 tablet, Rfl: 1  Physical exam:  Vitals:   07/20/22 1029  BP: 130/79  Pulse: 86  Resp: 18  Temp: (!) 96.3 F (35.7 C)  TempSrc: Tympanic   Weight: (!) 355 lb (161 kg)  Height:  (1.6 m)   Physical Exam Cardiovascular:     Rate and Rhythm: Normal rate and regular rhythm.     Heart sounds: Normal heart sounds.  Pulmonary:     Effort: Pulmonary effort is normal.     Breath sounds: Normal breath sounds.  Abdominal:     General: Bowel sounds are normal.     Palpations: Abdomen is soft.  Skin:    General: Skin is warm and dry.  Neurological:     Mental Status: She is alert and oriented to person, place, and time.   Breast exam was performed in seated and lying down position. Patient is status post left lumpectomy with a well-healed surgical scar. No evidence of any palpable masses. No evidence of axillary adenopathy. No evidence of any palpable masses or lumps in the right breast. No evidence of right axillary adenopathy      Latest Ref Rng & Units 03/25/2022    9:23 AM  CMP  Glucose 70 - 99 mg/dL 161   BUN 8 - 27 mg/dL 23   Creatinine 0.96 - 1.00 mg/dL 0.45   Sodium 409 - 811 mmol/L 139   Potassium 3.5 - 5.2 mmol/L 4.4   Chloride 96 - 106 mmol/L 97   CO2 20 - 29 mmol/L 26   Calcium 8.7 - 10.3 mg/dL 91.4   Total Protein 6.0 - 8.5 g/dL 7.0   Total Bilirubin 0.0 - 1.2 mg/dL 0.4   Alkaline Phos 44 - 121 IU/L 60   AST 0 - 40 IU/L 19   ALT 0 - 32 IU/L 28       Latest Ref Rng & Units 03/25/2022    9:23 AM  CBC  WBC 3.4 - 10.8 x10E3/uL 6.2   Hemoglobin 11.1 - 15.9 g/dL 78.2   Hematocrit 95.6 - 46.6 % 42.5   Platelets 150 - 450 x10E3/uL 212      Assessment and plan- Patient is a 72 y.o. female  with history of stage I ER/PR positive HER2 negative left breast cancer currently on letrozole.  She is here for routine follow-up  Patient is tolerating letrozole well without any significant side effects.  Her breast exam is unremarkable at this time.  Her baseline bone density is normal as  well.  She will be due for her mammogram in August 2024 which we will schedule.  We will also schedule her bone density for  November this year.  I will see her back in 6 months no labs  Patient does report that her blood sugars and cholesterol was worse and medications are being adjusted by Dr. Laural Benes.  Her hypercholesterolemia certainly can worsen with letrozole I have counseled her about the cardiovascular side effects of letrozole   Visit Diagnosis 1. Encounter for follow-up surveillance of breast cancer   2. Use of letrozole (Femara)      Dr. Owens Shark, MD, MPH [facility] [phone] 07/20/2022 10:45 AM

## 2022-07-31 ENCOUNTER — Ambulatory Visit
Admission: RE | Admit: 2022-07-31 | Discharge: 2022-07-31 | Disposition: A | Discharge status: Home / Self Care | Payer: Medicare Other | Source: Ambulatory Visit | Attending: Physician Assistant | Admitting: Physician Assistant

## 2022-07-31 VITALS — BP 125/75 | HR 91 | Temp 98.4°F | Resp 18 | Ht 63.0 in | Wt 355.0 lb

## 2022-07-31 DIAGNOSIS — L03116 Cellulitis of left lower limb: Secondary | ICD-10-CM

## 2022-07-31 DIAGNOSIS — I872 Venous insufficiency (chronic) (peripheral): Secondary | ICD-10-CM | POA: Diagnosis not present

## 2022-07-31 DIAGNOSIS — R600 Localized edema: Secondary | ICD-10-CM

## 2022-07-31 MED ORDER — MUPIROCIN 2 % EX OINT: 1.0000 | TOPICAL_OINTMENT | Freq: Two times a day (BID) | CUTANEOUS | 0 refills | Status: DC

## 2022-07-31 MED ORDER — CEPHALEXIN 500 MG PO CAPS: 500.0000 mg | ORAL_CAPSULE | Freq: Four times a day (QID) | ORAL | 0 refills | Status: DC

## 2022-07-31 NOTE — Discharge Instructions (Signed)
-  Mild infection.  I sent antibiotics to pharmacy for you to take orally and also use topically. -Area should be improving in the next couple of days.  If it worsens or is not improving he should follow-up with your primary care provider. - If at any point you develop a fever or significantly worse swelling or pain go to emergency department.

## 2022-07-31 NOTE — ED Provider Notes (Signed)
[facility]    CSN: [id number] Arrival date & time: 07/31/22  1018      History   Chief Complaint Chief Complaint  Patient presents with   Abscess    Sore on left lower leg. - Entered by patient    HPI Sharon Ware is a 72 y.o. female with history of breast cancer, diabetes, hypertension, hyperlipidemia, and obesity.  Patient presents today for evaluation of suspected infection of left lower leg x 1 week.  Symptoms have recently worsened.  She reports that the area has oozed a little bit of clear to light yellowish fluid.  She has been cleaning the area and applying topical bacitracin without any improvement.  Reports a history of cellulitis in the past.  Denies fever, fatigue.  States she has a history of lymphedema and peripheral edema related to breast cancer.  States that she tries to elevate her legs and sometimes that helps little bit with swelling but she always has swelling in her lower extremities.  Denies it being any worse than normal.  Also reporting a history of arthritis in her knees.  Recently started taking Tylenol using IcyHot.  HPI  Past Medical History:  Diagnosis Date   Arthritis    Breast cancer (HCC)    Cataract    Diabetes mellitus without complication (HCC)    Hyperlipidemia    Hypertension    Vitamin D deficiency     Patient Active Problem List   Diagnosis Date Noted   Edema 01/30/2021   Malignant neoplasm of upper-inner quadrant of left breast in female, estrogen receptor positive (HCC) 12/01/2020   Goals of care, counseling/discussion 12/01/2020   Morbid obesity (HCC) 08/08/2018   BMI 60.0-69.9, adult (HCC) 08/08/2018   Controlled diabetes mellitus type 2 with complications (HCC) 08/08/2018   Hyperlipidemia 08/08/2018   Hypertension associated with diabetes (HCC) 08/08/2018   Arthritis of both knees 08/08/2018   Vitamin D deficiency 08/08/2018    Past Surgical History:  Procedure Laterality Date   ABDOMINAL HYSTERECTOMY      x2   BREAST BIOPSY Right 1990s   neg   BREAST BIOPSY Right 2000s   neg   BREAST BIOPSY Left 11/24/2020   u/s bx 10:30/8 cmfn-"heart" marker   BREAST EXCISIONAL BIOPSY Right    neg   BREAST LUMPECTOMY     BREAST LUMPECTOMY WITH RADIOFREQUENCY TAG IDENTIFICATION Left 12/10/2020   Procedure: BREAST LUMPECTOMY WITH RADIOFREQUENCY TAG IDENTIFICATION;  Surgeon: Campbell Lerner, MD;  Location: [facility] ORS;  Service: General;  Laterality: Left;   DILATION AND CURETTAGE OF UTERUS     EYE SURGERY  2004   cataract surgery and laser surgery   Salpingo-oophorectomy      OB History   No obstetric history on file.      Home Medications    Prior to Admission medications   Medication Sig Start Date End Date Taking? Authorizing Provider  acetaminophen (TYLENOL) 500 MG tablet Take 500 mg by mouth in the morning and at bedtime.    [provider]  aspirin 81 MG tablet Take 81 mg by mouth daily.    [provider]  Calcium Citrate-Vitamin D (CALCIUM CITRATE + D3 PO) Take 1,200 mg by mouth daily.    [provider]  cephALEXin (KEFLEX) 500 MG capsule Take 1 capsule (500 mg total) by mouth 4 (four) times daily for 7 days. 07/31/22 08/07/22 Yes Shirlee Latch, PA-C  Cholecalciferol (VITAMIN D3) 125 MCG (5000 UT) TABS Take 5,000 Units by  mouth daily.    [provider]  dapagliflozin propanediol (FARXIGA) 10 MG TABS tablet Take 1 tablet (10 mg total) by mouth daily before breakfast. 07/13/22   Laural Benes, Megan P, DO  Glucosamine HCl 1500 MG TABS Take 1,500 mg by mouth in the morning and at bedtime.    [provider]  letrozole (FEMARA) 2.5 MG tablet Take 1 tablet (2.5 mg total) by mouth daily. 04/12/22   Creig Hines, MD  losartan-hydrochlorothiazide (HYZAAR) 50-12.5 MG tablet Take 1 tablet by mouth daily. 04/13/22   Johnson, Megan P, DO  lovastatin (MEVACOR) 20 MG tablet TAKE 1 TABLET BY MOUTH EVERYDAY AT BEDTIME 04/13/22   Johnson, Megan P, DO  Menthol, Topical  Analgesic, (ICY HOT EX) Apply 1 application topically daily as needed (pain).    [provider]  metFORMIN (GLUCOPHAGE-XR) 500 MG 24 hr tablet Take 2 tablets (1,000 mg total) by mouth 2 (two) times daily with a meal. 04/13/22   Johnson, Megan P, DO  mupirocin ointment (BACTROBAN) 2 % Apply 1 Application topically 2 (two) times daily. 07/31/22  Yes Eusebio Friendly B, PA-C  Omega-3 Fatty Acids (FISH OIL ULTRA) 1400 MG CAPS Take 1,400 mg by mouth daily.    [provider]  spironolactone (ALDACTONE) 50 MG tablet Take 1 tablet (50 mg total) by mouth daily. 04/13/22   Dorcas Carrow, DO    Family History Family History  Problem Relation Age of Onset   Breast cancer Mother 30   Heart disease Father    Cancer Paternal Grandfather    Heart disease Paternal Grandfather     Social History Social History   Tobacco Use   Smoking status: Never   Smokeless tobacco: Never  Vaping Use   Vaping Use: Never used  Substance Use Topics   Alcohol use: Not Currently   Drug use: Never     Allergies   Other, Wound dressing adhesive, Morphine and related, and Latex   Review of Systems Review of Systems  Constitutional:  Negative for fatigue and fever.  Respiratory:  Negative for shortness of breath.   Cardiovascular:  Positive for leg swelling.  Musculoskeletal:  Positive for arthralgias and joint swelling. Negative for gait problem.  Skin:  Positive for color change and wound.  Neurological:  Negative for weakness.     Physical Exam Triage Vital Signs ED Triage Vitals  Enc Vitals Group     BP      Pulse      Resp      Temp      Temp src      SpO2      Weight      Height      Head Circumference      Peak Flow      Pain Score      Pain Loc      Pain Edu?      Excl. in GC?    No data found.  Updated Vital Signs BP 125/75 (BP Location: Right Arm)   Pulse 91   Temp 98.4 F (36.9 C) (Oral)   Resp 18   Ht 5\' 3"  (1.6 m)   Wt (!) 355 lb (161 kg)   SpO2 91%   BMI  62.89 kg/m      Physical Exam Vitals and nursing note reviewed.  Constitutional:      General: She is not in acute distress.    Appearance: Normal appearance. She is obese. She is not ill-appearing or toxic-appearing.  HENT:     Head: Normocephalic and atraumatic.  Eyes:     General: No scleral icterus.       Right eye: No discharge.        Left eye: No discharge.     Conjunctiva/sclera: Conjunctivae normal.  Cardiovascular:     Rate and Rhythm: Normal rate and regular rhythm.     Heart sounds: Normal heart sounds.  Pulmonary:     Effort: Pulmonary effort is normal. No respiratory distress.     Breath sounds: Normal breath sounds.  Musculoskeletal:     Cervical back: Neck supple.     Right lower leg: Edema present.     Left lower leg: Edema present.     Comments: Significant edema of bilateral lower extremities.  Skin:    General: Skin is dry.     Comments: Left lower extremity: Of the anterior leg distally there is an area of erythema and slightly increased warmth.  Small fluid-filled sac which is draining serosanguineous fluid.  Area is mildly tender to palpation.  Dry and cracked skin of both lower legs.  Neurological:     General: No focal deficit present.     Mental Status: She is alert. Mental status is at baseline.     Motor: No weakness.     Gait: Gait normal.  Psychiatric:        Mood and Affect: Mood normal.        Behavior: Behavior normal.        Thought Content: Thought content normal.      UC Treatments / Results  Labs (all labs ordered are listed, but only abnormal results are displayed) Labs Reviewed - No data to display  EKG   Radiology No results found.  Procedures Procedures (including critical care time)  Medications Ordered in UC Medications - No data to display  Initial Impression / Assessment and Plan / UC Course  I have reviewed the triage vital signs and the nursing notes.  Pertinent labs & imaging results that were available  during my care of the patient were reviewed by me and considered in my medical decision making (see chart for details).   72 y/o female with history of breast cancer, DM, HTN, and obesity presents for abscess of lower leg.   Patient is afebrile.  I included an image in the chart of her left lower extremity which is consistent with stasis dermatitis and cellulitis.  She has significant swelling of both lower extremities.  She says this is not unusual for her.  History of peripheral edema and lymphedema which she says was large related to breast cancer.  Will treat patient at this time with Keflex.  Also sent mupirocin ointment.  Discussed wound care guidelines with her husband.  Continue Tylenol for discomfort.  Elevate lower extremities to help with swelling.  Advised following up with PCP if no improvement or if symptoms are worsening next week.  ED precautions discussed.   Final Clinical Impressions(s) / UC Diagnoses   Final diagnoses:  Cellulitis of left lower extremity  Stasis dermatitis of both legs  Peripheral edema     Discharge Instructions      -Mild infection.  I sent antibiotics to pharmacy for you to take orally and also use topically. -Area should be improving in the next couple of days.  If it worsens or is not improving he should follow-up with your primary care provider. - If at any point you develop a fever or significantly worse  swelling or pain go to emergency department.     ED Prescriptions     Medication Sig Dispense Auth. Provider   cephALEXin (KEFLEX) 500 MG capsule Take 1 capsule (500 mg total) by mouth 4 (four) times daily for 7 days. 28 capsule Eusebio Friendly B, PA-C   mupirocin ointment (BACTROBAN) 2 % Apply 1 Application topically 2 (two) times daily. 22 g Shirlee Latch, PA-C      I have reviewed the PDMP during this encounter.   Shirlee Latch, PA-C 07/31/22 1101

## 2022-07-31 NOTE — ED Triage Notes (Addendum)
Patient presents with sore on right leg x 1 week. Treating with Bacitracin without change. Patient also reports an increase pain in bilateral knees. Pt had Tylenol and ICYhot this morning.

## 2022-08-04 ENCOUNTER — Ambulatory Visit (INDEPENDENT_AMBULATORY_CARE_PROVIDER_SITE_OTHER): Payer: Medicare Other | Admitting: Nurse Practitioner

## 2022-08-04 ENCOUNTER — Encounter: Payer: Self-pay | Admitting: Nurse Practitioner

## 2022-08-04 VITALS — BP 127/69 | HR 96 | Temp 98.0°F

## 2022-08-04 DIAGNOSIS — L03116 Cellulitis of left lower limb: Secondary | ICD-10-CM

## 2022-08-04 DIAGNOSIS — L0390 Cellulitis, unspecified: Secondary | ICD-10-CM | POA: Insufficient documentation

## 2022-08-04 MED ORDER — CEPHALEXIN 500 MG PO CAPS: 500.0000 mg | ORAL_CAPSULE | Freq: Four times a day (QID) | ORAL | 0 refills | Status: AC

## 2022-08-04 NOTE — Progress Notes (Signed)
BP 127/69   Pulse 96   Temp 98 F (36.7 C) (Oral)   SpO2 (!) 88%    Subjective:    Patient ID: Sharon Ware, female    DOB: [birth year], 72 y.o.   MRN: [id number]  HPI: Sharon Ware is a 72 y.o. female  Chief Complaint  Patient presents with   Cellulitis    Pt states she went to UC last Saturday and was diganosed with cellulitis in the left lower leg. currently taking keflex and mupirocin. Has got bigger redder and purple ,swelling hasn't gone down   Patient states she was seen in UC for cellulitis on Saturday.  The area had been there for about a week but had gotten a good bit bigger on Thursday/Friday.  She was started on Keflex at the UC.  Feels like the swelling hasn't improved.  States the area has gotten larger and more red.     Relevant past medical, surgical, family and social history reviewed and updated as indicated. Interim medical history since our last visit reviewed. Allergies and medications reviewed and updated.  Review of Systems  Musculoskeletal:        Swelling LLE    Per HPI unless specifically indicated above     Objective:    BP 127/69   Pulse 96   Temp 98 F (36.7 C) (Oral)   SpO2 (!) 88%   Wt Readings from Last 3 Encounters:  07/31/22 (!) 355 lb (161 kg)  07/20/22 (!) 355 lb (161 kg)  03/25/22 (!) 364 lb (165.1 kg)    Physical Exam Vitals and nursing note reviewed.  Constitutional:      General: She is not in acute distress.    Appearance: Normal appearance. She is obese. She is not ill-appearing, toxic-appearing or diaphoretic.  HENT:     Head: Normocephalic.     Right Ear: External ear normal.     Left Ear: External ear normal.     Nose: Nose normal.     Mouth/Throat:     Mouth: Mucous membranes are moist.     Pharynx: Oropharynx is clear.  Eyes:     General:        Right eye: No discharge.        Left eye: No discharge.     Extraocular Movements: Extraocular movements intact.     Conjunctiva/sclera: Conjunctivae  normal.     Pupils: Pupils are equal, round, and reactive to light.  Cardiovascular:     Rate and Rhythm: Normal rate and regular rhythm.     Heart sounds: No murmur heard. Pulmonary:     Effort: Pulmonary effort is normal. No respiratory distress.     Breath sounds: Normal breath sounds. No wheezing or rales.  Musculoskeletal:     Cervical back: Normal range of motion and neck supple.  Skin:    General: Skin is warm and dry.     Capillary Refill: Capillary refill takes less than 2 seconds.       Neurological:     General: No focal deficit present.     Mental Status: She is alert and oriented to person, place, and time. Mental status is at baseline.  Psychiatric:        Mood and Affect: Mood normal.        Behavior: Behavior normal.        Thought Content: Thought content normal.        Judgment: Judgment normal.     Results for  orders placed or performed in visit on 07/13/22  Bayer DCA Hb A1c Waived  Result Value Ref Range   HB A1C (BAYER DCA - WAIVED) 7.4 (H) 4.8 - 5.6 %      Assessment & Plan:   Problem List Items Addressed This Visit       Other   Cellulitis - Primary    Ongoing.  Will extend antibiotics for 10 days.  Redness has not extended beyond marking by UC on Saturday.  Redness is present with dry flaky skin.  Complete course of antibiotics.  Follow up in 6 days.  Call sooner if concerns arise.         Follow up plan: Return in about 6 days (around 08/10/2022).

## 2022-08-04 NOTE — Assessment & Plan Note (Signed)
Ongoing.  Will extend antibiotics for 10 days.  Redness has not extended beyond marking by UC on Saturday.  Redness is present with dry flaky skin.  Complete course of antibiotics.  Follow up in 6 days.  Call sooner if concerns arise.

## 2022-08-09 ENCOUNTER — Encounter: Payer: Self-pay | Admitting: Family Medicine

## 2022-08-09 ENCOUNTER — Other Ambulatory Visit: Payer: Self-pay | Admitting: Family Medicine

## 2022-08-09 ENCOUNTER — Ambulatory Visit: Payer: Self-pay

## 2022-08-09 ENCOUNTER — Ambulatory Visit (INDEPENDENT_AMBULATORY_CARE_PROVIDER_SITE_OTHER): Payer: Medicare Other | Admitting: Family Medicine

## 2022-08-09 ENCOUNTER — Telehealth: Payer: Self-pay | Admitting: Family Medicine

## 2022-08-09 VITALS — BP 119/73 | HR 86 | Temp 98.7°F

## 2022-08-09 DIAGNOSIS — L03116 Cellulitis of left lower limb: Secondary | ICD-10-CM

## 2022-08-09 MED ORDER — SULFAMETHOXAZOLE-TRIMETHOPRIM 800-160 MG PO TABS: 1.0000 | ORAL_TABLET | Freq: Two times a day (BID) | ORAL | 0 refills | Status: DC

## 2022-08-09 NOTE — Telephone Encounter (Signed)
Pt is calling report cellulitis of the left leg. Pt has appt for tomorrow. Calling to see if any appt for today. Pt reports no pain with redness and swelling of the left leg     Chief Complaint: Cellulitis left leg, looks worse today. Requests to be seen today. Symptoms: Redness, swelling Frequency: 07/2722 Pertinent Negatives: Patient denies fever Disposition: [] ED /[] Urgent Care (no appt availability in office) / [x] Appointment(In office/virtual)/ []  [facility]/ [] Home Care/ [] Refused Recommended Disposition /[] [facility]/ []  Follow-up with PCP Additional Notes:   Reason for Disposition  [1] Taking antibiotic > 24 hours AND [2] cellulitis symptoms are WORSE (e.g., spreading redness, pain, swelling)  Answer Assessment - Initial Assessment Questions 1. SYMPTOM: "What's the main symptom you're concerned about?" (e.g., redness, swelling, pain, fever, weakness)     Redness, swelling 2. CELLULITIS LOCATION: "Where is the cellulitis located?" (e.g., hand, arm, foot, leg, face)     Left leg 3. CELLULITIS SIZE: "What is the size of the red area?" (e.g., inches, centimeters; compare to size of a coin) .     Not better 4. BETTER-SAME-WORSE: "Are you getting better, staying the same, or getting worse compared to the day you started the antibiotics?"      Worse 5. PAIN: Do you have any pain?"  If Yes, ask: "How bad is the pain?"  (e.g., Scale 1-10; mild, moderate, or severe)    - MILD (1-3): Doesn't interfere with normal activities.     - MODERATE (4-7): Interferes with normal activities or awakens from sleep.    - SEVERE (8-10): Excruciating pain, unable to do any normal activities.       No 6. FEVER: "Do you have a fever?" If Yes, ask: "What is it, how was it measured and when did it start?"     No 7. OTHER SYMPTOMS: "Do you have any other symptoms?" (e.g., pus coming from a wound, red streaks, weakness)     No 8. DIAGNOSIS DATE: "When was the cellulitis diagnosed?" "By  whom?"      07/31/22 9. ANTIBIOTIC NAME: "What antibiotic(s) are you taking?"  "How many times per day?" (Be sure the patient is receiving the antibiotic as directed).      Keflex 10. ANTIBIOTIC DATE: "When was the antibiotic started?"       07/31/22 11. FOLLOW-UP APPOINTMENT: "Do you have follow-up appointment with your doctor?"       Yes  Protocols used: Cellulitis on Antibiotic Follow-up Call-A-AH

## 2022-08-09 NOTE — Progress Notes (Signed)
BP 119/73   Pulse 86   Temp 98.7 F (37.1 C) (Oral)   SpO2 (!) 89%    Subjective:    Patient ID: Sharon Ware, female    DOB: [birth year], 72 y.o.   MRN: [id number]  HPI: Sharon Ware is a 72 y.o. female  Chief Complaint  Patient presents with   Cellulitis    Patient says she has been seen this will be visit #3 and she says she is not getting any better. Patient says she was prescribed medications at both visits and says she is not getting any better actually getting worse.    SKIN INFECTION Duration: 2 weeks Location: L lower leg History of trauma in area: no Pain: no Severity: Ware Redness: yes Swelling: yes Oozing: no Pus: no Fevers: no Nausea/vomiting: no Status: worse Treatments attempted: Keflex Tetanus: UTD   Relevant past medical, surgical, family and social history reviewed and updated as indicated. Interim medical history since our last visit reviewed. Allergies and medications reviewed and updated.  Review of Systems  Constitutional: Negative.   Respiratory: Negative.    Cardiovascular: Negative.   Gastrointestinal: Negative.   Musculoskeletal: Negative.   Skin:  Positive for color change. Negative for pallor, rash and wound.  Psychiatric/Behavioral: Negative.      Per HPI unless specifically indicated above     Objective:    BP 119/73   Pulse 86   Temp 98.7 F (37.1 C) (Oral)   SpO2 (!) 89%   Wt Readings from Last 3 Encounters:  07/31/22 (!) 355 lb (161 kg)  07/20/22 (!) 355 lb (161 kg)  03/25/22 (!) 364 lb (165.1 kg)    Physical Exam Vitals and nursing note reviewed.  Constitutional:      General: She is not in acute distress.    Appearance: Normal appearance. She is obese. She is not ill-appearing, toxic-appearing or diaphoretic.  HENT:     Head: Normocephalic and atraumatic.     Right Ear: External ear normal.     Left Ear: External ear normal.     Nose: Nose normal.     Mouth/Throat:     Mouth: Mucous membranes are  moist.     Pharynx: Oropharynx is clear.  Eyes:     General: No scleral icterus.       Right eye: No discharge.        Left eye: No discharge.     Extraocular Movements: Extraocular movements intact.     Conjunctiva/sclera: Conjunctivae normal.     Pupils: Pupils are equal, round, and reactive to light.  Cardiovascular:     Rate and Rhythm: Normal rate and regular rhythm.     Pulses: Normal pulses.     Heart sounds: Normal heart sounds. No murmur heard.    No friction rub. No gallop.  Pulmonary:     Effort: Pulmonary effort is normal. No respiratory distress.     Breath sounds: Normal breath sounds. No stridor. No wheezing, rhonchi or rales.  Chest:     Chest wall: No tenderness.  Musculoskeletal:        General: Normal range of motion.     Cervical back: Normal range of motion and neck supple.     Right lower leg: Edema present.     Left lower leg: Edema present.  Skin:    General: Skin is warm and dry.     Capillary Refill: Capillary refill takes less than 2 seconds.     Coloration: Skin is  not jaundiced or pale.     Findings: Erythema present. No bruising, lesion or rash.  Neurological:     General: No focal deficit present.     Mental Status: She is alert and oriented to person, place, and time. Mental status is at baseline.  Psychiatric:        Mood and Affect: Mood normal.        Behavior: Behavior normal.        Thought Content: Thought content normal.        Judgment: Judgment normal.     Results for orders placed or performed in visit on 07/13/22  Bayer DCA Hb A1c Waived  Result Value Ref Range   HB A1C (BAYER DCA - WAIVED) 7.4 (H) 4.8 - 5.6 %      Assessment & Plan:   Problem List Items Addressed This Visit       Other   Cellulitis - Primary    Worsening. Will change antbiotic to bactrim and recheck in 3 days. Call with any concerns.         Follow up plan: Return Thursday or Friday.

## 2022-08-09 NOTE — Telephone Encounter (Signed)
Pt is calling in because she says Dr. Laural Benes was supposed to send in a prescription for Bactrim, but the pharmacy hasn't received it. Pt would like it to go to the [facility] in [place] on [address]. Pt would like someone to call her when the medication is sent.

## 2022-08-09 NOTE — Assessment & Plan Note (Signed)
Worsening. Will change antbiotic to bactrim and recheck in 3 days. Call with any concerns.

## 2022-08-10 ENCOUNTER — Ambulatory Visit: Payer: Medicare Other | Admitting: Family Medicine

## 2022-08-13 ENCOUNTER — Encounter: Payer: Self-pay | Admitting: Family Medicine

## 2022-08-13 ENCOUNTER — Ambulatory Visit (INDEPENDENT_AMBULATORY_CARE_PROVIDER_SITE_OTHER): Payer: Medicare Other | Admitting: Family Medicine

## 2022-08-13 VITALS — BP 121/72 | HR 90 | Temp 98.7°F

## 2022-08-13 DIAGNOSIS — L03116 Cellulitis of left lower limb: Secondary | ICD-10-CM

## 2022-08-13 MED ORDER — SULFAMETHOXAZOLE-TRIMETHOPRIM 800-160 MG PO TABS: 1.0000 | ORAL_TABLET | Freq: Two times a day (BID) | ORAL | 0 refills | Status: DC

## 2022-08-13 NOTE — Progress Notes (Signed)
BP 121/72   Pulse 90   Temp 98.7 F (37.1 C) (Oral)   SpO2 90%    Subjective:    Patient ID: Sharon Ware, female    DOB: Jun 23, 1950, 72 y.o.   MRN: [id number]  HPI: Sharon Ware is a 72 y.o. female  Chief Complaint  Patient presents with   Cellulitis   SKIN INFECTION Duration: 3 weeks Location: Left lower extremity History of trauma in area: no Pain: yes Quality: sore Severity: moderate Redness: yes Swelling: yes Oozing: no Pus: no Fevers: no Nausea/vomiting: no Status: stable Treatments attempted:antibiotics- has been on keflex and bactrim without resolution  Tetanus: UTD  Relevant past medical, surgical, family and social history reviewed and updated as indicated. Interim medical history since our last visit reviewed. Allergies and medications reviewed and updated.  Review of Systems  Constitutional: Negative.   HENT: Negative.    Respiratory: Negative.    Cardiovascular: Negative.   Gastrointestinal: Negative.   Musculoskeletal: Negative.   Skin:  Positive for color change and rash. Negative for pallor and wound.  Neurological: Negative.   Psychiatric/Behavioral: Negative.      Per HPI unless specifically indicated above     Objective:    BP 121/72   Pulse 90   Temp 98.7 F (37.1 C) (Oral)   SpO2 90%   Wt Readings from Last 3 Encounters:  07/31/22 (!) 355 lb (161 kg)  07/20/22 (!) 355 lb (161 kg)  03/25/22 (!) 364 lb (165.1 kg)    Physical Exam Vitals and nursing note reviewed.  Constitutional:      General: She is not in acute distress.    Appearance: Normal appearance. She is obese. She is not ill-appearing, toxic-appearing or diaphoretic.  HENT:     Head: Normocephalic and atraumatic.     Right Ear: External ear normal.     Left Ear: External ear normal.     Nose: Nose normal.     Mouth/Throat:     Mouth: Mucous membranes are moist.     Pharynx: Oropharynx is clear.  Eyes:     General: No scleral icterus.       Right  eye: No discharge.        Left eye: No discharge.     Extraocular Movements: Extraocular movements intact.     Conjunctiva/sclera: Conjunctivae normal.     Pupils: Pupils are equal, round, and reactive to light.  Cardiovascular:     Rate and Rhythm: Normal rate and regular rhythm.     Pulses: Normal pulses.     Heart sounds: Normal heart sounds. No murmur heard.    No friction rub. No gallop.  Pulmonary:     Effort: Pulmonary effort is normal. No respiratory distress.     Breath sounds: Normal breath sounds. No stridor. No wheezing, rhonchi or rales.  Chest:     Chest wall: No tenderness.  Musculoskeletal:        General: Normal range of motion.     Cervical back: Normal range of motion and neck supple.  Skin:    General: Skin is warm and dry.     Capillary Refill: Capillary refill takes less than 2 seconds.     Coloration: Skin is not jaundiced or pale.     Findings: Erythema (Slightly less red, still hot, not any smaller area of erythema on L LLE) present. No bruising, lesion or rash.  Neurological:     General: No focal deficit present.  Mental Status: She is alert and oriented to person, place, and time. Mental status is at baseline.  Psychiatric:        Mood and Affect: Mood normal.        Behavior: Behavior normal.        Thought Content: Thought content normal.        Judgment: Judgment normal.     Results for orders placed or performed in visit on 07/13/22  Bayer DCA Hb A1c Waived  Result Value Ref Range   HB A1C (BAYER DCA - WAIVED) 7.4 (H) 4.8 - 5.6 %      Assessment & Plan:   Problem List Items Addressed This Visit       Other   Cellulitis - Primary    Not better. No systemic symptoms. Will try to get her in with ID- referral generated today. Will continue bactrim for 10 days. Continue to monitor. Call with any concerns.      Relevant Orders   Ambulatory referral to Infectious Disease     Follow up plan: Return if symptoms worsen or fail to  improve.

## 2022-08-13 NOTE — Assessment & Plan Note (Signed)
Not better. No systemic symptoms. Will try to get her in with ID- referral generated today. Will continue bactrim for 10 days. Continue to monitor. Call with any concerns.

## 2022-08-16 ENCOUNTER — Encounter: Payer: Self-pay | Admitting: Internal Medicine

## 2022-08-16 ENCOUNTER — Ambulatory Visit (INDEPENDENT_AMBULATORY_CARE_PROVIDER_SITE_OTHER): Payer: Medicare Other | Admitting: Internal Medicine

## 2022-08-16 ENCOUNTER — Other Ambulatory Visit: Payer: Self-pay

## 2022-08-16 VITALS — BP 138/69 | HR 90 | Temp 98.2°F | Resp 16

## 2022-08-16 DIAGNOSIS — R609 Edema, unspecified: Secondary | ICD-10-CM | POA: Diagnosis not present

## 2022-08-16 DIAGNOSIS — L03116 Cellulitis of left lower limb: Secondary | ICD-10-CM | POA: Diagnosis present

## 2022-08-16 MED ORDER — SULFAMETHOXAZOLE-TRIMETHOPRIM 800-160 MG PO TABS
1.0000 | ORAL_TABLET | Freq: Two times a day (BID) | ORAL | 0 refills | Status: AC
Start: 2022-08-16 — End: 2022-08-20

## 2022-08-16 NOTE — Assessment & Plan Note (Signed)
  Patient presents with slow to resolve left leg cellulitis initially treated with Keflex (4/27-5/6) and now Bactrim (5/6-present).  She seems improved since switching to Bactrim so it is possible that she needed MRSA coverage given the initial purulent drainage reported.  Alternatively, this could have just been slow to resolve but either way difficult to know for sure.  Discussed with her body habitus and chronic edema that her cellulitis is going to be slower to resolve than normal but she seems to be trending in the right direction.  Will extend Bactrim x 4 more days to complete 14 days of total MRSA coverage.  Recommend continued elevation and at this time will also refer to the lymphedema clinic for further assistance with decreasing the amount of swelling in her lower extremities.  Will have her follow up in about 3-4 weeks to ensure she is doing well off antibiotics for clinical re-evaluation.  She prefers to do so in [place] so will arrange follow up with Dr Rivka Safer if possible.

## 2022-08-16 NOTE — Progress Notes (Signed)
[facility]  Reason for Consult: Cellulitis  Referring Provider: Dr Laural Benes   HPI:    Sharon Ware is a 72 y.o. female with PMHx as below who presents to the clinic for cellulitis.   Patient seen in the ED on 4/27 for cellulitis of left leg on background of lymphedema with chronic venous stasis.  No labs obtained.  Rx for Keflex.  5/1 f/u with PCP and keflex extended.  5/6 f/u with PCP.  Worsening noted and changed to bactrim.  5/10 f/u with PCP.  Not better.  She is not having systemic symptoms.  Bactrim extended x 10 days.    She has now been on antibiotics for about 2.5 weeks.  Reports today some improvement since starting Bactrim about 7 days ago.  The redness seems improved.  No fevers, chills, nausea, vomiting.  She is tolerating antibiotics "okay".  She does elevate her legs but has not done compression therapy as of yet.   Patient's Medications  New Prescriptions   No medications on file  Previous Medications   ACETAMINOPHEN (TYLENOL) 650 MG CR TABLET    Take 650 mg by mouth every 8 (eight) hours as needed for pain.   ASPIRIN 81 MG TABLET    Take 81 mg by mouth daily.   CALCIUM CITRATE-VITAMIN D (CALCIUM CITRATE + D3 PO)    Take 1,200 mg by mouth daily.   CHOLECALCIFEROL (VITAMIN D3) 125 MCG (5000 UT) TABS    Take 5,000 Units by mouth daily.   DAPAGLIFLOZIN PROPANEDIOL (FARXIGA) 10 MG TABS TABLET    Take 1 tablet (10 mg total) by mouth daily before breakfast.   GLUCOSAMINE HCL 1500 MG TABS    Take 1,500 mg by mouth in the morning and at bedtime.   LETROZOLE (FEMARA) 2.5 MG TABLET    Take 1 tablet (2.5 mg total) by mouth daily.   LOSARTAN-HYDROCHLOROTHIAZIDE (HYZAAR) 50-12.5 MG TABLET    Take 1 tablet by mouth daily.   LOVASTATIN (MEVACOR) 20 MG TABLET    TAKE 1 TABLET BY MOUTH EVERYDAY AT BEDTIME   MENTHOL, TOPICAL ANALGESIC, (ICY HOT EX)    Apply 1 application topically daily as needed (pain).   METFORMIN (GLUCOPHAGE-XR) 500 MG 24  HR TABLET    Take 2 tablets (1,000 mg total) by mouth 2 (two) times daily with a meal.   MUPIROCIN OINTMENT (BACTROBAN) 2 %    Apply 1 Application topically 2 (two) times daily.   OMEGA-3 FATTY ACIDS (FISH OIL ULTRA) 1400 MG CAPS    Take 1,400 mg by mouth daily.   SPIRONOLACTONE (ALDACTONE) 50 MG TABLET    Take 1 tablet (50 mg total) by mouth daily.  Modified Medications   Modified Medication Previous Medication   SULFAMETHOXAZOLE-TRIMETHOPRIM (BACTRIM DS) 800-160 MG TABLET sulfamethoxazole-trimethoprim (BACTRIM DS) 800-160 MG tablet      Take 1 tablet by mouth 2 (two) times daily for 4 days.    Take 1 tablet by mouth 2 (two) times daily.  Discontinued Medications   No medications on file      Past Medical History:  Diagnosis Date   Arthritis    Breast cancer (HCC)    Cataract    Diabetes mellitus without complication (HCC)    Hyperlipidemia    Hypertension    Vitamin D deficiency     Social History   Tobacco Use   Smoking status: Never   Smokeless tobacco: Never  Vaping Use   Vaping Use:  Never used  Substance Use Topics   Alcohol use: Not Currently   Drug use: Never    Family History  Problem Relation Age of Onset   Breast cancer Mother 19   Heart disease Father    Cancer Paternal Grandfather    Heart disease Paternal Grandfather     Allergies  Allergen Reactions   Other Anaphylaxis    Honey Dew Melon   Wound Dressing Adhesive Rash   Morphine And Related Other (See Comments)    Hypotension   Latex Rash    ROS    OBJECTIVE:    Vitals:   08/16/22 1356 08/16/22 1357  BP: (!) 155/101 138/69  Pulse: 90   Resp: 16   Temp: 98.2 F (36.8 C)   TempSrc: Oral   SpO2: 90%      There is no height or weight on file to calculate BMI.  Physical Exam Constitutional:      General: She is not in acute distress.    Appearance: Normal appearance.  HENT:     Head: Normocephalic and atraumatic.  Eyes:     Extraocular Movements: Extraocular movements intact.      Conjunctiva/sclera: Conjunctivae normal.  Musculoskeletal:     Right lower leg: Edema present.     Left lower leg: Edema present.  Skin:    General: Skin is warm and dry.     Comments: Left leg with mild skin changes of anterior shin with chronic stasis dermatitis changes noted as well.  No significant warmth, erythema, drainage.   Neurological:     General: No focal deficit present.     Mental Status: She is alert and oriented to person, place, and time.  Psychiatric:        Mood and Affect: Mood normal.        Behavior: Behavior normal.      Labs and Microbiology:     Latest Ref Rng & Units 03/25/2022    9:23 AM 09/22/2021    9:23 AM 02/09/2021    9:56 AM  CBC  WBC 3.4 - 10.8 x10E3/uL 6.2  6.2  10.3   Hemoglobin 11.1 - 15.9 g/dL 16.1  09.6  04.5   Hematocrit 34.0 - 46.6 % 42.5  46.3  47.4   Platelets 150 - 450 x10E3/uL 212  203  204       Latest Ref Rng & Units 03/25/2022    9:23 AM 09/22/2021    9:23 AM 04/15/2021    9:55 AM  CMP  Glucose 70 - 99 mg/dL 409  811  914   BUN 8 - 27 mg/dL 23  27  32   Creatinine 0.57 - 1.00 mg/dL 7.82  9.56  2.13   Sodium 134 - 144 mmol/L 139  139  133   Potassium 3.5 - 5.2 mmol/L 4.4  4.7  4.3   Chloride 96 - 106 mmol/L 97  96  95   CO2 20 - 29 mmol/L 26  22  28    Calcium 8.7 - 10.3 mg/dL 08.6  57.8  9.3   Total Protein 6.0 - 8.5 g/dL 7.0  7.2  7.9   Total Bilirubin 0.0 - 1.2 mg/dL 0.4  0.3  0.8   Alkaline Phos 44 - 121 IU/L 60  56  55   AST 0 - 40 IU/L 19  18  25    ALT 0 - 32 IU/L 28  29  26        ASSESSMENT & PLAN:  Cellulitis  Patient presents with slow to resolve left leg cellulitis initially treated with Keflex (4/27-5/6) and now Bactrim (5/6-present).  She seems improved since switching to Bactrim so it is possible that she needed MRSA coverage given the initial purulent drainage reported.  Alternatively, this could have just been slow to resolve but either way difficult to know for sure.  Discussed with her body habitus  and chronic edema that her cellulitis is going to be slower to resolve than normal but she seems to be trending in the right direction.  Will extend Bactrim x 4 more days to complete 14 days of total MRSA coverage.  Recommend continued elevation and at this time will also refer to the lymphedema clinic for further assistance with decreasing the amount of swelling in her lower extremities.  Will have her follow up in about 3-4 weeks to ensure she is doing well off antibiotics for clinical re-evaluation.  She prefers to do so in [place] so will arrange follow up with Dr Rivka Safer if possible.   Orders Placed This Encounter  Procedures   Ambulatory referral to Occupational Therapy    Referral Priority:   Routine    Referral Type:   Occupational Therapy    Referral Reason:   Specialty Services Required    Requested Specialty:   Occupational Therapy    Number of Visits Requested:   1       Lady Deutscher [facility] 08/16/2022, 2:29 PM

## 2022-08-16 NOTE — Patient Instructions (Addendum)
For Lymphedema management:  [facility] [address],  [place]  [address] Phone number: [phone]   Thank you for coming to see me today. It was a pleasure seeing you.  To Do: Continue Bactrim x 2 weeks total.  I sent in 4 more days of medication Follow up in about 3-4 weeks for clinical recheck with [place] or at [facility] Referral placed for lymphedema management to help with swelling  If you have any questions or concerns, please do not hesitate to call the office at [phone].  Take Care,   Gwynn Burly, DO

## 2022-08-28 ENCOUNTER — Ambulatory Visit
Admission: RE | Admit: 2022-08-28 | Discharge: 2022-08-28 | Disposition: A | Discharge status: Home / Self Care | Payer: Medicare Other | Source: Ambulatory Visit | Attending: Physician Assistant | Admitting: Physician Assistant

## 2022-08-28 VITALS — BP 123/75 | HR 88 | Temp 98.4°F | Resp 24

## 2022-08-28 DIAGNOSIS — L03116 Cellulitis of left lower limb: Secondary | ICD-10-CM

## 2022-08-28 DIAGNOSIS — I872 Venous insufficiency (chronic) (peripheral): Secondary | ICD-10-CM | POA: Diagnosis not present

## 2022-08-28 DIAGNOSIS — I890 Lymphedema, not elsewhere classified: Secondary | ICD-10-CM

## 2022-08-28 MED ORDER — MUPIROCIN 2 % EX OINT: 1.0000 | TOPICAL_OINTMENT | Freq: Two times a day (BID) | CUTANEOUS | 0 refills | Status: DC

## 2022-08-28 MED ORDER — DOXYCYCLINE HYCLATE 100 MG PO CAPS: 100.0000 mg | ORAL_CAPSULE | Freq: Two times a day (BID) | ORAL | 0 refills | Status: AC

## 2022-08-28 NOTE — ED Triage Notes (Signed)
Pt states that she's had cellulites since March. Left leg.

## 2022-08-28 NOTE — ED Provider Notes (Signed)
[facility]    CSN: [id number] Arrival date & time: 08/28/22  1004      History   Chief Complaint Chief Complaint  Patient presents with   Leg Injury    Persistent cellulitis. - Entered by patient    HPI Sharon Ware is a 72 y.o. female with history of breast cancer, diabetes, hypertension, hyperlipidemia, and obesity.  Patient presents today for evaluation of suspected infection of left lower leg x 5 weeks.  She was initially seen by me on 07/31/22 and treated for cellulitis with Keflex.  She was then seen by PCP on 08/04/2022 and prescribed more Keflex.  Followed up with PCP on 08/09/2022 with worsening symptoms and was switched to Bactrim DS.  Followed up on 08/13/2022 with PCP without improvement and the Bactrim was extended for 10 days.  Patient followed up with infectious disease specialist on 08/16/2022.  She was advised that she would receive a referral to lymphedema clinic but says she is not able to get in there until the beginning of August.  She was given 4 more days of Bactrim DS to complete a 2-week course.  Patient says she has been off the antibiotics for 6 days and symptoms do not seem any better.  She thinks they might potentially have gotten a little worse.   Denies fever, fatigue.  States she has a history of lymphedema and peripheral edema related to breast cancer.  States that she tries to elevate her legs and sometimes that helps little bit with swelling but she always has swelling in her lower extremities.  Denies it being any worse than normal.  Also reporting a history of arthritis in her knees.  Patient is concerned that she cannot wait to follow-up with infectious disease on 09/09/2022.  She would like to consider more antibiotics.  HPI  Past Medical History:  Diagnosis Date   Arthritis    Breast cancer (HCC)    Cataract    Diabetes mellitus without complication (HCC)    Hyperlipidemia    Hypertension    Vitamin D deficiency     Patient Active  Problem List   Diagnosis Date Noted   Cellulitis 08/04/2022   Edema 01/30/2021   Malignant neoplasm of upper-inner quadrant of left breast in female, estrogen receptor positive (HCC) 12/01/2020   Goals of care, counseling/discussion 12/01/2020   Morbid obesity (HCC) 08/08/2018   BMI 60.0-69.9, adult (HCC) 08/08/2018   Controlled diabetes mellitus type 2 with complications (HCC) 08/08/2018   Hyperlipidemia 08/08/2018   Hypertension associated with diabetes (HCC) 08/08/2018   Arthritis of both knees 08/08/2018   Vitamin D deficiency 08/08/2018    Past Surgical History:  Procedure Laterality Date   ABDOMINAL HYSTERECTOMY     x2   BREAST BIOPSY Right 1990s   neg   BREAST BIOPSY Right 2000s   neg   BREAST BIOPSY Left 11/24/2020   u/s bx 10:30/8 cmfn-"heart" marker   BREAST EXCISIONAL BIOPSY Right    neg   BREAST LUMPECTOMY     BREAST LUMPECTOMY WITH RADIOFREQUENCY TAG IDENTIFICATION Left 12/10/2020   Procedure: BREAST LUMPECTOMY WITH RADIOFREQUENCY TAG IDENTIFICATION;  Surgeon: Campbell Lerner, MD;  Location: [facility] ORS;  Service: General;  Laterality: Left;   DILATION AND CURETTAGE OF UTERUS     EYE SURGERY  2004   cataract surgery and laser surgery   Salpingo-oophorectomy      OB History   No obstetric history on file.      Home Medications  Prior to Admission medications   Medication Sig Start Date End Date Taking? Authorizing Provider  acetaminophen (TYLENOL) 650 MG CR tablet Take 650 mg by mouth every 8 (eight) hours as needed for pain.   Yes [provider]  aspirin 81 MG tablet Take 81 mg by mouth daily.   Yes [provider]  Calcium Citrate-Vitamin D (CALCIUM CITRATE + D3 PO) Take 1,200 mg by mouth daily.   Yes [provider]  Cholecalciferol (VITAMIN D3) 125 MCG (5000 UT) TABS Take 5,000 Units by mouth daily.   Yes [provider]  dapagliflozin propanediol (FARXIGA) 10 MG TABS tablet Take 1 tablet (10 mg total) by mouth  daily before breakfast. 07/13/22  Yes Johnson, Megan P, DO  doxycycline (VIBRAMYCIN) 100 MG capsule Take 1 capsule (100 mg total) by mouth 2 (two) times daily for 10 days. 08/28/22 09/07/22 Yes Shirlee Latch, PA-C  Glucosamine HCl 1500 MG TABS Take 1,500 mg by mouth in the morning and at bedtime.   Yes [provider]  letrozole (FEMARA) 2.5 MG tablet Take 1 tablet (2.5 mg total) by mouth daily. 04/12/22  Yes Creig Hines, MD  losartan-hydrochlorothiazide (HYZAAR) 50-12.5 MG tablet Take 1 tablet by mouth daily. 04/13/22  Yes Johnson, Megan P, DO  lovastatin (MEVACOR) 20 MG tablet TAKE 1 TABLET BY MOUTH EVERYDAY AT BEDTIME 04/13/22  Yes Johnson, Megan P, DO  Menthol, Topical Analgesic, (ICY HOT EX) Apply 1 application topically daily as needed (pain).   Yes [provider]  metFORMIN (GLUCOPHAGE-XR) 500 MG 24 hr tablet Take 2 tablets (1,000 mg total) by mouth 2 (two) times daily with a meal. 04/13/22  Yes Johnson, Megan P, DO  mupirocin ointment (BACTROBAN) 2 % Apply 1 Application topically 2 (two) times daily. 07/31/22  Yes Eusebio Friendly B, PA-C  Omega-3 Fatty Acids (FISH OIL ULTRA) 1400 MG CAPS Take 1,400 mg by mouth daily.   Yes [provider]  spironolactone (ALDACTONE) 50 MG tablet Take 1 tablet (50 mg total) by mouth daily. 04/13/22  Yes Dorcas Carrow, DO    Family History Family History  Problem Relation Age of Onset   Breast cancer Mother 9   Heart disease Father    Cancer Paternal Grandfather    Heart disease Paternal Grandfather     Social History Social History   Tobacco Use   Smoking status: Never   Smokeless tobacco: Never  Vaping Use   Vaping Use: Never used  Substance Use Topics   Alcohol use: Not Currently   Drug use: Never     Allergies   Other, Wound dressing adhesive, Morphine and codeine, and Latex   Review of Systems Review of Systems  Constitutional:  Negative for fatigue and fever.  Respiratory:  Negative for shortness of breath.    Cardiovascular:  Positive for leg swelling.  Musculoskeletal:  Positive for arthralgias and joint swelling. Negative for gait problem.  Skin:  Positive for color change. Negative for wound.  Neurological:  Negative for weakness.     Physical Exam Triage Vital Signs ED Triage Vitals  Enc Vitals Group     BP      Pulse      Resp      Temp      Temp src      SpO2      Weight      Height      Head Circumference      Peak Flow  Pain Score      Pain Loc      Pain Edu?      Excl. in GC?    No data found.  Updated Vital Signs BP 123/75   Pulse 88   Temp 98.4 F (36.9 C) (Oral)   Resp (!) 24   SpO2 90%      Physical Exam Vitals and nursing note reviewed.  Constitutional:      General: She is not in acute distress.    Appearance: Normal appearance. She is obese. She is not ill-appearing or toxic-appearing.  HENT:     Head: Normocephalic and atraumatic.  Eyes:     General: No scleral icterus.       Right eye: No discharge.        Left eye: No discharge.     Conjunctiva/sclera: Conjunctivae normal.  Cardiovascular:     Rate and Rhythm: Normal rate and regular rhythm.     Heart sounds: Normal heart sounds.  Pulmonary:     Effort: Pulmonary effort is normal. No respiratory distress.     Breath sounds: Normal breath sounds.  Musculoskeletal:     Cervical back: Neck supple.     Right lower leg: Edema present.     Left lower leg: Edema present.     Comments: Significant edema of bilateral lower extremities.  Skin:    General: Skin is dry.     Comments: Left lower extremity: Of the anterior leg distally there is an area of erythema and slightly increased warmth.  No weeping.  Area is mildly tender to palpation.  Dry and cracked skin of both lower legs.  Neurological:     General: No focal deficit present.     Mental Status: She is alert. Mental status is at baseline.     Motor: No weakness.     Gait: Gait normal.  Psychiatric:        Mood and Affect: Mood  normal.        Behavior: Behavior normal.        Thought Content: Thought content normal.      UC Treatments / Results  Labs (all labs ordered are listed, but only abnormal results are displayed) Labs Reviewed - No data to display  EKG   Radiology No results found.  Procedures Procedures (including critical care time)  Medications Ordered in UC Medications - No data to display  Initial Impression / Assessment and Plan / UC Course  I have reviewed the triage vital signs and the nursing notes.  Pertinent labs & imaging results that were available during my care of the patient were reviewed by me and considered in my medical decision making (see chart for details).   72 y/o female with history of breast cancer, DM, HTN, and obesity presents for cellulitis of lower leg x 5 weeks.  With Keflex and Bactrim already.  Noted some improvement with Bactrim.  Follows up with infectious disease and has upcoming appointment on 09/09/2022.  Awaiting appointment with lymphedema clinic in August.  Patient is afebrile.  I included an image in the chart of her left lower extremity which is consistent with stasis dermatitis and cellulitis.  She has significant swelling of both lower extremities.  She says this is not unusual for her.  History of peripheral edema and lymphedema which she says was large related to breast cancer.  Will treat patient at this time with doxycycline.  Discussed wound care guidelines with her husband.  Continue Tylenol for  discomfort.  Elevate lower extremities to help with swelling.  Advised following up with PCP if no improvement or if symptoms are worsening next week.  ED precautions discussed.   Final Clinical Impressions(s) / UC Diagnoses   Final diagnoses:  Cellulitis of leg, left  Stasis dermatitis  Lymphedema of left leg     Discharge Instructions      -Take course of antibiotics. - Elevate extremities. - Continue to follow-up with infectious disease  specialist.  Call the lymphedema clinic back and see if they can keep you on the list in case there are any cancellations and contact you sooner. - Go to ER if any acute worsening of the swelling, redness or if you develop fever.    ED Prescriptions     Medication Sig Dispense Auth. Provider   doxycycline (VIBRAMYCIN) 100 MG capsule Take 1 capsule (100 mg total) by mouth 2 (two) times daily for 10 days. 20 capsule Shirlee Latch, PA-C      PDMP not reviewed this encounter.      Shirlee Latch, PA-C 08/28/22 1056

## 2022-08-28 NOTE — Discharge Instructions (Addendum)
-  Take course of antibiotics. - Elevate extremities. - Continue to follow-up with infectious disease specialist.  Call the lymphedema clinic back and see if they can keep you on the list in case there are any cancellations and contact you sooner. - Go to ER if any acute worsening of the swelling, redness or if you develop fever.

## 2022-08-28 NOTE — ED Triage Notes (Signed)
She's been taking bactrim. Finished 14 days on Sunday

## 2022-09-07 ENCOUNTER — Ambulatory Visit: Payer: Medicare Other | Attending: Internal Medicine | Admitting: Occupational Therapy

## 2022-09-07 ENCOUNTER — Encounter: Payer: Self-pay | Admitting: Occupational Therapy

## 2022-09-07 DIAGNOSIS — R609 Edema, unspecified: Secondary | ICD-10-CM | POA: Insufficient documentation

## 2022-09-07 DIAGNOSIS — L03116 Cellulitis of left lower limb: Secondary | ICD-10-CM | POA: Insufficient documentation

## 2022-09-07 DIAGNOSIS — I890 Lymphedema, not elsewhere classified: Secondary | ICD-10-CM | POA: Diagnosis present

## 2022-09-07 NOTE — Therapy (Unsigned)
OUTPATIENT PHYSICAL THERAPY LOWER EXTREMITY LYMPHEDEMA EVALUATION  Patient Name: Sharon Ware MRN: [id number] DOB:11-Aug-1950, 72 y.o., female Today's Date: 09/07/2022  END OF SESSION:        Past Medical History:  Diagnosis Date   Arthritis    Breast cancer (HCC)    Cataract    Diabetes mellitus without complication (HCC)    Hyperlipidemia    Hypertension    Vitamin D deficiency    Past Surgical History:  Procedure Laterality Date   ABDOMINAL HYSTERECTOMY     x2   BREAST BIOPSY Right 1990s   neg   BREAST BIOPSY Right 2000s   neg   BREAST BIOPSY Left 11/24/2020   u/s bx 10:30/8 cmfn-"heart" marker   BREAST EXCISIONAL BIOPSY Right    neg   BREAST LUMPECTOMY     BREAST LUMPECTOMY WITH RADIOFREQUENCY TAG IDENTIFICATION Left 12/10/2020   Procedure: BREAST LUMPECTOMY WITH RADIOFREQUENCY TAG IDENTIFICATION;  Surgeon: Campbell Lerner, MD;  Location: [facility] ORS;  Service: General;  Laterality: Left;   DILATION AND CURETTAGE OF UTERUS     EYE SURGERY  2004   cataract surgery and laser surgery   Salpingo-oophorectomy     Patient Active Problem List   Diagnosis Date Noted   Cellulitis 08/04/2022   Edema 01/30/2021   Malignant neoplasm of upper-inner quadrant of left breast in female, estrogen receptor positive (HCC) 12/01/2020   Goals of care, counseling/discussion 12/01/2020   Morbid obesity (HCC) 08/08/2018   BMI 60.0-69.9, adult (HCC) 08/08/2018   Controlled diabetes mellitus type 2 with complications (HCC) 08/08/2018   Hyperlipidemia 08/08/2018   Hypertension associated with diabetes (HCC) 08/08/2018   Arthritis of both knees 08/08/2018   Vitamin D deficiency 08/08/2018    PCP: Olevia Perches, DO  REFERRING PROVIDER: Gwynn Burly, DO  REFERRING DIAG: I89.0  THERAPY DIAG:  Lymphedema, not elsewhere classified  Rationale for Evaluation and Treatment: Rehabilitation  ONSET DATE: 1 yr ago without known preceipitationg event  SUBJECTIVE:                                                                                                                                                                                            SUBJECTIVE STATEMENT:  PERTINENT HISTORY:   PAIN:  Are you having pain? No  PRECAUTIONS: {Therapy precautions:24002}  WEIGHT BEARING RESTRICTIONS: No  FALLS:  Has patient fallen in last 6 months? No  LIVING ENVIRONMENT: Lives with: lives with their family and lives with their spouse Lives in: House/apartment Stairs: Yes; {Stairs:24000} Has following equipment at home: Wheelchair (power), Wheelchair (manual), Grab bars, and Ramped entry  OCCUPATION: ***  LEISURE: grandchildren  HAND  DOMINANCE: right   PRIOR LEVEL OF FUNCTION: {PLOF:24004}  PATIENT GOALS: ***   OBJECTIVE:  COGNITION:  Overall cognitive status: Within functional limits for tasks assessed   PALPATION: ***  OBSERVATIONS / OTHER ASSESSMENTS: ***  SENSATION: {sensation:27233}  POSTURE: ***  LE ROM:   {AROM/PROM:27142}  Right 09/07/2022 LEFT 09/07/2022  Hip flexion    Hip extension    Hip abduction    Hip adduction    Hip internal rotation    Hip external rotation    Knee flexion    Knee extension    Ankle dorsiflexion    Ankle plantarflexion    Ankle inversion    Ankle eversion     (Blank rows = not tested)  LE MMT:   MMT Right 09/07/2022 Left 09/07/2022  Hip flexion    Hip extension    Hip abduction    Hip adduction    Hip internal rotation    Hip external rotation    Knee flexion    Knee extension    Ankle dorsiflexion    Ankle plantarflexion    Ankle inversion    Ankle eversion     (Blank rows = not tested)  LYMPHEDEMA ASSESSMENTS:   SURGERY TYPE/DATE: ***  NUMBER OF LYMPH NODES REMOVED: ***  CHEMOTHERAPY: ***  RADIATION:***  HORMONE TREATMENT: ***  INFECTIONS: ***   LYMPHEDEMA ASSESSMENTS:   LANDMARK RIGHT  09/07/2022  10 cm proximal to olecranon process   Olecranon process   10 cm proximal to  ulnar styloid process   Just proximal to ulnar styloid process   Across hand at thumb web space   At base of 2nd digit   (Blank rows = not tested)  LANDMARK LEFT  09/07/2022  10 cm proximal to olecranon process   Olecranon process   10 cm proximal to ulnar styloid process   Just proximal to ulnar styloid process   Across hand at thumb web space   At base of 2nd digit   (Blank rows = not tested)   LE LANDMARK RIGHT 09/07/2022  At groin   30 cm proximal to suprapatella   20 cm proximal to suprapatella   10 cm proximal to suprapatella   At midpatella / popliteal crease   30 cm proximal to floor at lateral plantar foot   20 cm proximal to floor at lateral plantar foot   10 cm proximal to floor at lateral plantar foot   Circumference of ankle/heel   5 cm proximal to 1st MTP joint   Across MTP joint   Around proximal great toe   (Blank rows = not tested)  LE LANDMARK LEFT 09/07/2022  At groin   30 cm proximal to suprapatella   20 cm proximal to suprapatella   10 cm proximal to suprapatella   At midpatella / popliteal crease   30 cm proximal to floor at lateral plantar foot   20 cm proximal to floor at lateral plantar foot   10 cm proximal to floor at lateral plantar foot   Circumference of ankle/heel   5 cm proximal to 1st MTP joint   Across MTP joint   Around proximal great toe   (Blank rows = not tested)  FUNCTIONAL TESTS:  {Functional tests:24029}  GAIT: Distance walked: *** Assistive device utilized: {Assistive devices:23999} Level of assistance: {Levels of assistance:24026} Comments: ***  LYMPHEDEMA LIFE IMPACT SCALE (LLIS):  I. Physical Concerns The amount of pain associated with my lymphedema is: {LLISSELECTION:27229} The amount of limb heaviness associated with  my lymphedema is: {LLISSELECTION:27229} The amount of skin tightness associated with my lymphedema is: {LLISSELECTION:27229} In comparison to my unaffected limb, the size of my swollen limb seems:  {LLISSELECTION:27229} In comparison to my unaffected limb, the texture of my swollen limb feels: {LLISSELECTION:27229} Lymphedema affects movement of my swollen limb: {LLISSELECTION:27229} The strength in my swollen limb compared with the unaffected limb is: {LLISSELECTION:27229} How often have you become ill wit an infection in your swollen limb requiring oral antibiotics or hospitalization in the past 2 YEARS? {LLISSELECTION:27229}  II. Psychosocial Concerns 9.   Lymphedema affects my body image (ie. "How I think I look"): {LLISSELECTION:27229} 10. Lymphedema affects my socializing with others: {LLISSELECTION:27229} 11. Lymphedema affects my intimate relations: {LLISSELECTION:27229} 12. Lymphedema "gets me down" (ie. I have feelings of depression, frustration, or anger due to the lymphedema): {LLISSELECTION:27229}  III. Functional Concerns 13. Lymphedema affects my ability to perform duties at home: {LLISSELECTION:27229} 14. Lymphedema affects my ability to perform dueites at work (if applicable): {LLISSELECTION:27229} 15. Lymphedema affects my performance of preferred recreational activities: {LLISSELECTION:27229} 16. Lymphedema affects the proper fit of clothing/shoes: {LLISSELECTION:27229} 17. Lymphedema affects my sleep: {LLISSELECTION:27229} 18. I must rely on others for help due to my lymphedema: {LLISSELECTION:27229}  TODAY'S TREATMENT:                                                                                                                                         DATE: ***  PATIENT EDUCATION:  Education details: *** Person educated: {Person educated:25204} Education method: {Education Method:25205} Education comprehension: {Education Comprehension:25206}  HOME EXERCISE PROGRAM: ***  ASSESSMENT:  CLINICAL IMPRESSION: Patient is a *** y.o. *** who was seen today for physical therapy evaluation and treatment for ***.   OBJECTIVE IMPAIRMENTS: {opptimpairments:25111}.    ACTIVITY LIMITATIONS: {activitylimitations:27494}  PARTICIPATION LIMITATIONS: {participationrestrictions:25113}  PERSONAL FACTORS: {Personal factors:25162} are also affecting patient's functional outcome.   REHAB POTENTIAL: {rehabpotential:25112}  CLINICAL DECISION MAKING: {clinical decision making:25114}  EVALUATION COMPLEXITY: {Evaluation complexity:25115}   GOALS: Goals reviewed with patient? {yes/no:20286}  SHORT TERM GOALS: Target date: ***  *** Baseline: Goal status: {GOALSTATUS:25110}  2.  *** Baseline:  Goal status: {GOALSTATUS:25110}  3.  *** Baseline:  Goal status: {GOALSTATUS:25110}  4.  *** Baseline:  Goal status: {GOALSTATUS:25110}  5.  *** Baseline:  Goal status: {GOALSTATUS:25110}  6.  *** Baseline:  Goal status: {GOALSTATUS:25110}  LONG TERM GOALS: Target date: ***  *** Baseline:  Goal status: {GOALSTATUS:25110}  2.  *** Baseline:  Goal status: {GOALSTATUS:25110}  3.  *** Baseline:  Goal status: {GOALSTATUS:25110}  4.  *** Baseline:  Goal status: {GOALSTATUS:25110}  5.  *** Baseline:  Goal status: {GOALSTATUS:25110}  6.  *** Baseline:  Goal status: {GOALSTATUS:25110}   PLAN:  PT FREQUENCY: {rehab frequency:25116}  PT DURATION: {rehab duration:25117}  PLANNED INTERVENTIONS: {rehab planned interventions:25118::"Therapeutic exercises","Therapeutic activity","Neuromuscular re-education","Balance training","Gait training","Patient/Family education","Self Care","Joint mobilization"}  PLAN FOR NEXT SESSION: ***   Aggie Cosier  Akshath Mccarey, MS, OTR/L, CLT-LANA 09/07/22 1:22 PM

## 2022-09-08 ENCOUNTER — Encounter: Payer: Self-pay | Admitting: Occupational Therapy

## 2022-09-09 ENCOUNTER — Ambulatory Visit: Payer: Medicare Other | Attending: Infectious Diseases | Admitting: Infectious Diseases

## 2022-09-09 ENCOUNTER — Encounter: Payer: Self-pay | Admitting: Infectious Diseases

## 2022-09-09 VITALS — BP 138/89 | HR 89 | Temp 99.2°F

## 2022-09-09 DIAGNOSIS — I890 Lymphedema, not elsewhere classified: Secondary | ICD-10-CM | POA: Diagnosis present

## 2022-09-09 DIAGNOSIS — E785 Hyperlipidemia, unspecified: Secondary | ICD-10-CM | POA: Diagnosis not present

## 2022-09-09 DIAGNOSIS — E119 Type 2 diabetes mellitus without complications: Secondary | ICD-10-CM | POA: Insufficient documentation

## 2022-09-09 DIAGNOSIS — I10 Essential (primary) hypertension: Secondary | ICD-10-CM | POA: Diagnosis not present

## 2022-09-09 DIAGNOSIS — Z7984 Long term (current) use of oral hypoglycemic drugs: Secondary | ICD-10-CM

## 2022-09-09 DIAGNOSIS — L03119 Cellulitis of unspecified part of limb: Secondary | ICD-10-CM

## 2022-09-09 DIAGNOSIS — C50919 Malignant neoplasm of unspecified site of unspecified female breast: Secondary | ICD-10-CM | POA: Diagnosis not present

## 2022-09-09 DIAGNOSIS — L03116 Cellulitis of left lower limb: Secondary | ICD-10-CM | POA: Diagnosis not present

## 2022-09-09 NOTE — Patient Instructions (Addendum)
You are here for cellulitis-left leg- thisis apatch of skin in the lower leg that intermittently gets red but no fever or traveling upwards or sever epain There may be an element of contact dermatitis /allergic dermatitis and infection intermittently causing cellulitis Please do the following to void infections 1) Do not use cerave+ triamcinolone cream 2) Use Eucerin Moisture cream or Aveeno moisturizer 3) Do not wear tight foot wear 4) Ankle and toe exercises 5) Avoid antibiotics for minimal redness  6) Follow up lymphedema clinic 7) follow up as needed

## 2022-09-09 NOTE — Progress Notes (Signed)
NAME: Sharon Ware  DOB: [birth year]  MRN: [id number]  Date/Time: 09/09/2022 11:27 AM   Subjective:   ?recurrent cellulitis [name] is a 72 y.o. female with a history of Lymphedema, ca breast S/p surgery/radiation and now on letrazole, HTN, DM, HLD has had recurrent red patch on the left lower extremity for which she has been treated wth multiple course of antibiotics. Pt saw her dermatologist for her annual check up in June 2023 And was prescribed a compounding topical cream which was cerave + triamcinaolone which she has been using every day since then on her legs. In oct 2023 she sustained an injury on a chair causing a tear on the post part of the left leg and it took a long time to heal and needed antibiotics. Then she developed a patch of red skin  on the anterior aspect of the leg which was red and oozy and she was using bacitracin and as it was not getting better [facility] and [facility]elfe[facility]    She saw her PCP on 5/1 and keflex extended  and again on 5/6 and was switched to bactrim as it was worse  and saw Dr.Wallace on 5/13 and was given 3 more days of bactrim. Picture taken that time   She then went to [facility] again on 08/28/22 With some redness, but no pain, no fever , no extension and was given doxycycline for 10 days She is here as a follow up to see me instead of going to [place] to see Dr.Wallace  as she lives here. She has been applying mupirocin topically and also using cerave with triamcinolone She went to lymphedema clinic and has an appt next week  Past Medical History:  Diagnosis Date   Arthritis    Breast cancer (HCC)    Cataract    Diabetes mellitus without complication (HCC)    Hyperlipidemia    Hypertension    Vitamin D deficiency     Past Surgical History:  Procedure Laterality Date   ABDOMINAL HYSTERECTOMY     x2   BREAST BIOPSY Right 1990s   neg   BREAST BIOPSY Right 2000s   neg   BREAST BIOPSY Left 11/24/2020   u/s bx 10:30/8  cmfn-"heart" marker   BREAST EXCISIONAL BIOPSY Right    neg   BREAST LUMPECTOMY     BREAST LUMPECTOMY WITH RADIOFREQUENCY TAG IDENTIFICATION Left 12/10/2020   Procedure: BREAST LUMPECTOMY WITH RADIOFREQUENCY TAG IDENTIFICATION;  Surgeon: Campbell Lerner, MD;  Location: [facility];  Service: General;  Laterality: Left;   DILATION AND CURETTAGE OF UTERUS     EYE SURGERY  2004   cataract surgery and laser surgery   Salpingo-oophorectomy      Social History   Socioeconomic History   Marital status: Married    Spouse name: Not on file   Number of children: Not on file   Years of education: Not on file   Highest education level: Not on file  Occupational History   Occupation: retired  Tobacco Use   Smoking status: Never   Smokeless tobacco: Never  Vaping Use   Vaping Use: Never used  Substance and Sexual Activity   Alcohol use: Not Currently   Drug use: Never   Sexual activity: Not Currently  Other Topics Concern   Not on file  Social History Narrative   Not on file   Social Determinants of Health   Financial Resource Strain: Low Risk  (10/26/2021)   Overall Financial Resource Strain (CARDIA)  Difficulty of Paying Living Expenses: Not hard at all  Food Insecurity: No Food Insecurity (10/26/2021)   Hunger Vital Sign    Worried About Running Out of Food in the Last Year: Never true    Ran Out of Food in the Last Year: Never true  Transportation Needs: No Transportation Needs (10/26/2021)   PRAPARE - Administrator, Civil Service (Medical): No    Lack of Transportation (Non-Medical): No  Physical Activity: Inactive (10/26/2021)   Exercise Vital Sign    Days of Exercise per Week: 0 days    Minutes of Exercise per Session: 0 min  Stress: No Stress Concern Present (10/26/2021)   [facility] [employer] - Occupational Stress Questionnaire    Feeling of Stress : Not at all  Social Connections: Moderately Isolated (10/26/2021)   Social Connection and  Isolation Panel [NHANES]    Frequency of Communication with Friends and Family: More than three times a week    Frequency of Social Gatherings with Friends and Family: More than three times a week    Attends Religious Services: Never    Database administrator or Organizations: No    Attends Banker Meetings: Never    Marital Status: Married  Catering manager Violence: Not At Risk (10/26/2021)   Humiliation, Afraid, Rape, and Kick questionnaire    Fear of Current or Ex-Partner: No    Emotionally Abused: No    Physically Abused: No    Sexually Abused: No    Family History  Problem Relation Age of Onset   Breast cancer Mother 41   Heart disease Father    Cancer Paternal Grandfather    Heart disease Paternal Grandfather    Allergies  Allergen Reactions   Other Anaphylaxis    Honey Dew Melon   Wound Dressing Adhesive Rash   Morphine And Codeine Other (See Comments)    Hypotension   Latex Rash   I? Current Outpatient Medications  Medication Sig Dispense Refill   acetaminophen (TYLENOL) 650 MG CR tablet Take 650 mg by mouth every 8 (eight) hours as needed for pain.     aspirin 81 MG tablet Take 81 mg by mouth daily.     Calcium Citrate-Vitamin D (CALCIUM CITRATE + D3 PO) Take 1,200 mg by mouth daily.     Cholecalciferol (VITAMIN D3) 125 MCG (5000 UT) TABS Take 5,000 Units by mouth daily.     dapagliflozin propanediol (FARXIGA) 10 MG TABS tablet Take 1 tablet (10 mg total) by mouth daily before breakfast. 90 tablet 1   Glucosamine HCl 1500 MG TABS Take 1,500 mg by mouth in the morning and at bedtime.     letrozole (FEMARA) 2.5 MG tablet Take 1 tablet (2.5 mg total) by mouth daily. 90 tablet 1   losartan-hydrochlorothiazide (HYZAAR) 50-12.5 MG tablet Take 1 tablet by mouth daily. 90 tablet 1   lovastatin (MEVACOR) 20 MG tablet TAKE 1 TABLET BY MOUTH EVERYDAY AT BEDTIME 90 tablet 3   Menthol, Topical Analgesic, (ICY HOT EX) Apply 1 application topically daily as needed  (pain).     metFORMIN (GLUCOPHAGE-XR) 500 MG 24 hr tablet Take 2 tablets (1,000 mg total) by mouth 2 (two) times daily with a meal. 360 tablet 1   mupirocin ointment (BACTROBAN) 2 % Apply 1 Application topically 2 (two) times daily. 22 g 0   Omega-3 Fatty Acids (FISH OIL ULTRA) 1400 MG CAPS Take 1,400 mg by mouth daily.     spironolactone (ALDACTONE) 50  MG tablet Take 1 tablet (50 mg total) by mouth daily. 90 tablet 1   No current facility-administered medications for this visit.     Abtx:  Anti-infectives (From admission, onward)    None       REVIEW OF SYSTEMS:  Const: negative fever, negative chills, negative weight loss Eyes: negative diplopia or visual changes, negative eye pain ENT: negative coryza, negative sore throat Resp: negative cough, hemoptysis, dyspnea Cards: negative for chest pain, palpitations, lower extremity edema GU: negative for frequency, dysuria and hematuria GI: Negative for abdominal pain, diarrhea, bleeding, constipation Skin: negative for rash and pruritus Heme: negative for easy bruising and gum/nose bleeding MS: negative for myalgias, arthralgias, back pain and muscle weakness Neurolo:negative for headaches, dizziness, vertigo, memory problems  Psych: negative for feelings of anxiety, depression  Endocrine: diabetes Allergy/Immunology- rash to some adhesives, latex Poor mobility Wheel chair bound Objective:  VITALS:  BP 138/89   Pulse 89   Temp 99.2 F (37.3 C) (Oral)   SpO2 (!) 89%   PHYSICAL EXAM:  General: Alert, cooperative, no distress, Increased BMI, in wheel chair Head: Normocephalic, without obvious abnormality, atraumatic. Eyes: Conjunctivae clear, anicteric sclerae. Pupils are equal ENT Nares normal. No drainage or sinus tenderness.  Neck:  symmetrical, no adenopathy, thyroid: non tender no carotid bruit and no JVD.  Lungs: Clear to auscultation bilaterally. No Wheezing or Rhonchi. No rales. Heart: Regular rate and rhythm, no  murmur, rub or gallop. Abdomen: Soft, non-tender,not distended. Bowel sounds normal. No masses Extremities: b/l severe lymphedema of lower extremities Dry skin both legs- small discoid area of minimal discoloration- no heat or redness  Skin: No rashes or lesions. Or bruising Lymph: Cervical, supraclavicular normal. Neurologic: Grossly non-focal Pertinent Labs No recent labs  Microbiology: none  IMAGING RESULTS:no relevant imaging ? Impression/Recommendation ? ?B/l lower extremities chronic lymphedema  She has been treated for ceollitis of the left leg- it is interesting to note that ot was a discoid patch of redness with not much of an extension or lymphangitis There could be an element of contact dermatitis to the antibioitc ointment she had ben using Also she has been using moisturizer with triamcinalone in it for a year Chronic topical steroid use can thin the skin and risk for infection increases- even though the triamcinalone was 0.25% I asked her to stop using the compouned cream and just use moisturizer like eucerin Also to not use bacitracin or any topical antibiotic as risk for contact dermatitis increases Asked her not to wear tight slip ons Toe and ankle exercises to improve mobility To avoid antibiotics for minimal discoloration  Follow up with lymphedema clinic Follow up with me at the next flare up  Ca breast s/p surgery, radiation and on letrazole  DM- on farxiga and metformin  HLD on lovastatin  HTN On spirinolactone   Discussed the management with patient and her husband in detail Follow PRN ? ___________________________________________________ Discussed with patient, requesting provider Note:  This document was prepared using Dragon voice recognition software and may include unintentional dictation errors.

## 2022-09-14 ENCOUNTER — Ambulatory Visit: Payer: Medicare Other | Admitting: Occupational Therapy

## 2022-09-14 DIAGNOSIS — I890 Lymphedema, not elsewhere classified: Secondary | ICD-10-CM

## 2022-09-14 NOTE — Therapy (Signed)
OUTPATIENT OCCUPATIONAL THERAPY TREATMENT  BILATERAL LOWER EXTREMITY LYMPHEDEMA  Patient Name: Sharon Ware MRN: [id number] DOB:May 01, 1950, 72 y.o., female Today's Date: 09/14/2022  END OF SESSION:   OT End of Session - 09/14/22 1325     Visit Number 2    Number of Visits 36    Date for OT Re-Evaluation 12/06/22    OT Start Time 0100    OT Stop Time 0215    OT Time Calculation (min) 75 min    Activity Tolerance Patient tolerated treatment well;No increased pain    Behavior During Therapy WFL for tasks assessed/performed              Past Medical History:  Diagnosis Date   Arthritis    Breast cancer (HCC)    Cataract    Diabetes mellitus without complication (HCC)    Hyperlipidemia    Hypertension    Vitamin D deficiency    Past Surgical History:  Procedure Laterality Date   ABDOMINAL HYSTERECTOMY     x2   BREAST BIOPSY Right 1990s   neg   BREAST BIOPSY Right 2000s   neg   BREAST BIOPSY Left 11/24/2020   u/s bx 10:30/8 cmfn-"heart" marker   BREAST EXCISIONAL BIOPSY Right    neg   BREAST LUMPECTOMY     BREAST LUMPECTOMY WITH RADIOFREQUENCY TAG IDENTIFICATION Left 12/10/2020   Procedure: BREAST LUMPECTOMY WITH RADIOFREQUENCY TAG IDENTIFICATION;  Surgeon: Campbell Lerner, MD;  Location: [facility] ORS;  Service: General;  Laterality: Left;   DILATION AND CURETTAGE OF UTERUS     EYE SURGERY  2004   cataract surgery and laser surgery   Salpingo-oophorectomy     Patient Active Problem List   Diagnosis Date Noted   Cellulitis 08/04/2022   Edema 01/30/2021   Malignant neoplasm of upper-inner quadrant of left breast in female, estrogen receptor positive (HCC) 12/01/2020   Goals of care, counseling/discussion 12/01/2020   Morbid obesity (HCC) 08/08/2018   BMI 60.0-69.9, adult (HCC) 08/08/2018   Controlled diabetes mellitus type 2 with complications (HCC) 08/08/2018   Hyperlipidemia 08/08/2018   Hypertension associated with diabetes (HCC) 08/08/2018   Arthritis  of both knees 08/08/2018   Vitamin D deficiency 08/08/2018   PCP: Olevia Perches, DO  REFERRING PROVIDER: Gwynn Burly, DO  REFERRING DIAG: I89.0  THERAPY DIAG:  Lymphedema, not elsewhere classified  Rationale for Evaluation and Treatment: Rehabilitation  ONSET DATE: 1 yr ago without known precipitating event- by report  SUBJECTIVE:  SUBJECTIVE STATEMENT:Sharon Ware presents for OT treatment to address BLE lymphedema, L>R. Pt is accompanied by her spouse, Sharon Ware, today, who assisted her with transfer onto the Rx bed and who will learn to assist with compression wrapping. Pt has no new complaints.    Tobacco Use: Low Risk  (09/09/2022)   Patient History    Smoking Tobacco Use: Never    Smokeless Tobacco Use: Never    Passive Exposure: Not on file   PERTINENT HISTORY: HTN, Obesity w BMI 60-69.9, DM, B knee arthritis, Hx L breast cancer, recurrent L leg cellulitis. MRSA test results pending  PAIN: Are you having pain? Denies LE related pain  PRECAUTIONS: Fall and Other: lymphedema precautions, DM. Hx L Br Ca  PRIOR LEVEL OF FUNCTION: Independent with household mobility with device, Requires assistive device for independence, Needs assistance with homemaking, Needs assistance with gait, and Needs assistance with transfers  PATIENT GOALS: ... Get improvement for my condition...., keep lymphedema from getting worse, and reduce infection risk"  LYMPHEDEMA ASSESSMENTS:  FOTO Functional Outcomes Measure: Initial 33%  Lymphedema Life Impact Scale (LLIS): 22.06%  BLE COMPARATIVE LIMB VOLUMETRICS Initial 09/14/22  LANDMARK RIGHT  11/25/21  R LEG (A-D) 7717.1 ml  R THIGH (E-G) ml  R FULL LIMB (A-G) ml  Limb Volume differential (LVD)  %  Volume change since initial %  Volume change overall V  (Blank  rows = not tested)  LANDMARK LEFT  11/25/21  R LEG (A-D) 7752.1 ml  R THIGH (E-G) ml  R FULL LIMB (A-G) ml  Limb Volume differential (LVD)  Leg LVD measures 0.5%,   Volume change since initial %  Volume change overall %  (Blank rows = not tested)   TODAY'S TREATMENT:                                                                                                                                         BLE comparative limb volumetrics Pt and family edu for LE self-management  PATIENT EDUCATION:  Continued Pt/ CG edu for lymphedema self care home program throughout session. Topics include outcome of comparative limb volumetrics- starting limb volume differentials (LVDs), technology and gradient techniques used for short stretch, multilayer compression wrapping, simple self-MLD, therapeutic lymphatic pumping exercises, skin/nail care, LE precautions,. compression garment recommendations and specifications, wear and care schedule and compression garment donning / doffing w assistive devices. Discussed progress towards all OT goals since commencing CDT. All questions answered to the Pt's satisfaction. Good return. Person educated: Patient and Spouse Education method: Explanation, Demonstration, and Handouts Education comprehension: verbalized understanding, returned demonstration, verbal cues required, and needs further education  Lymphedema HOME PROGRAM:Complete Decongestive Therapy (CDT) Self-Management Phase Daily simple self-MLD Daily BLE skin care and inspection Daily lymphatic pumping there ex Appropriate daily compression- knee length, will require max caregiver A to  and doff Leg elevation when seated  ASSESSMENT: CLINICAL IMPRESSION:Comparative limb volumetrics  reveal symmetrical swelling in legs bilaterally , despite recent cellulitis infection in the L leg. Legs are equally swollen below the knee and including feet. Redness and pebbly texture at distal anterior leg persists with  flaking , dry skin. Pt encouraged to carefully wash and dry skin and apply low ph lotion daily to improve hydration limit infection risk. Unfortunately time constraints limited ability to apply initial compression wraps today. We'll commence wrapping LLE and teaching gradient techniques next session. Cont as per POC.  OBJECTIVE IMPAIRMENTS: Abnormal gait, decreased activity tolerance, decreased balance, decreased endurance, decreased knowledge of condition, decreased knowledge of use of DME, decreased mobility, difficulty walking, decreased ROM, decreased strength, increased edema, impaired perceived functional ability, postural dysfunction, obesity, pain, and chronic progressive limb swelling with elevated infection risk and risk of non-healing wounds .   ACTIVITY LIMITATIONS: carrying, lifting, bending, sitting, standing, squatting, sleeping, stairs, transfers, bed mobility, bathing, toileting, dressing, hygiene/grooming, and caring for others  PARTICIPATION LIMITATIONS: meal prep, cleaning, laundry, driving, shopping, community activity, occupation, yard work, and church  PERSONAL FACTORS: 3+ comorbidities: HTN, Stage III Obesity, B knee arthritis pain and inflammation and impaired AROM 2/2 body habitus  are also affecting patient's functional outcome.   REHAB POTENTIAL:  Fair with daily CG assistance throughout Intensive Phase CDT.  Poor without daily CG assistance with all LE self care home program components  GOALS: Goals reviewed with patient? Yes  SHORT TERM GOALS: Target date: 4th OT Rx visit   Pt will demonstrate understanding of lymphedema precautions and prevention strategies with modified independence using a printed reference to identify at least 5 precautions and discussing how s/he may implement them into daily life to reduce risk of progression with modified assistance ( printed reference). Baseline: Max A Goal status: Initial  2.  Pt will be able to apply multilayer, thigh  length, compression wraps using gradient techniques with Max caregiver assistance to decrease limb volume, to limit infection risk, and to limit lymphedema progression.  Baseline: Dependent Goal status: Initial  LONG TERM GOALS: Target date: 12/06/22  Given this patient's Intake score of 22.06 % on the Lymphedema Life Impact Scale (LLIS), patient will experience a reduction of at least 5 points in her perceived level of functional impairment resulting from lymphedema to improve functional performance and quality of life (QOL). Baseline: 22.06 % Goal status:Initial  2.  Given this patient's Intake score of TBA/100% on the functional outcomes FOTO tool, patient will experience an increase in function of 3 points to improve basic and instrumental ADLs performance, including lymphedema self-care.  Baseline: TBA% Goal status: INITIAL  3.  During Intensive phase CDT Pt will achieve at least 85% compliance with all lymphedema self-care home program components, including  daily skin care, multilayer , gradient compression wraps with daily changes, daily simple self MLD and daily lymphatic pumping therex to achieve optimal clinical outcome and to habituate self care regime for optimal LE self-management over time. Baseline: Dependent Goal status:INITIAL  4.  Pt will achieve at least a 10% volume reductions bilaterally below the knees to return limb to more typical size and shape, to limit infection risk and LE progression, to decrease pain, to improve function, and to improve body image and QOL. Baseline: Dependent Goal status:INITIAL  5.  Pt will be able to don and doff appropriate compression garments and devices using assistive devices and extra time and Max CG assistance within 1 week of issue date for optimal lymphedema self-care. Baseline: Dependent Goal status: INITIAL PLAN:  PT FREQUENCY: 2 x/week  PT DURATION: 12  weeks other: and PRN  PLANNED INTERVENTIONS: Therapeutic exercises,  Therapeutic activity, Patient/Family education, Self Care, DME instructions, Manual lymph drainage, Compression bandaging, Manual therapy, and skin care during MLD, fit with appropriate custom compression garments and devices, fit with appropriate compression device  which  follows lymphatic pathways and anatomic distribution  PLAN FOR NEXT SESSION:  LLE bandages Pt/family edu for self-bandaging  Loel Dubonnet, MS, OTR/L, CLT-LANA 09/14/22 3:41 PM

## 2022-09-16 ENCOUNTER — Ambulatory Visit: Payer: Medicare Other | Admitting: Occupational Therapy

## 2022-09-16 DIAGNOSIS — I890 Lymphedema, not elsewhere classified: Secondary | ICD-10-CM | POA: Diagnosis not present

## 2022-09-16 NOTE — Therapy (Signed)
OUTPATIENT OCCUPATIONAL THERAPY TREATMENT  BILATERAL LOWER EXTREMITY LYMPHEDEMA  Patient Name: Sharon Ware MRN: [id number] DOB:12/20/1950, 72 y.o., female Today's Date: 09/16/2022  END OF SESSION:   OT End of Session - 09/16/22 0902     Visit Number 3    Number of Visits 36    Date for OT Re-Evaluation 12/06/22    OT Start Time 0900    OT Stop Time 1008    OT Time Calculation (min) 68 min    Activity Tolerance Patient tolerated treatment well;No increased pain    Behavior During Therapy WFL for tasks assessed/performed              Past Medical History:  Diagnosis Date   Arthritis    Breast cancer (HCC)    Cataract    Diabetes mellitus without complication (HCC)    Hyperlipidemia    Hypertension    Vitamin D deficiency    Past Surgical History:  Procedure Laterality Date   ABDOMINAL HYSTERECTOMY     x2   BREAST BIOPSY Right 1990s   neg   BREAST BIOPSY Right 2000s   neg   BREAST BIOPSY Left 11/24/2020   u/s bx 10:30/8 cmfn-"heart" marker   BREAST EXCISIONAL BIOPSY Right    neg   BREAST LUMPECTOMY     BREAST LUMPECTOMY WITH RADIOFREQUENCY TAG IDENTIFICATION Left 12/10/2020   Procedure: BREAST LUMPECTOMY WITH RADIOFREQUENCY TAG IDENTIFICATION;  Surgeon: Campbell Lerner, MD;  Location: [facility] ORS;  Service: General;  Laterality: Left;   DILATION AND CURETTAGE OF UTERUS     EYE SURGERY  2004   cataract surgery and laser surgery   Salpingo-oophorectomy     Patient Active Problem List   Diagnosis Date Noted   Cellulitis 08/04/2022   Edema 01/30/2021   Malignant neoplasm of upper-inner quadrant of left breast in female, estrogen receptor positive (HCC) 12/01/2020   Goals of care, counseling/discussion 12/01/2020   Morbid obesity (HCC) 08/08/2018   BMI 60.0-69.9, adult (HCC) 08/08/2018   Controlled diabetes mellitus type 2 with complications (HCC) 08/08/2018   Hyperlipidemia 08/08/2018   Hypertension associated with diabetes (HCC) 08/08/2018   Arthritis  of both knees 08/08/2018   Vitamin D deficiency 08/08/2018   PCP: Olevia Perches, DO  REFERRING PROVIDER: Gwynn Burly, DO  REFERRING DIAG: I89.0  THERAPY DIAG:  Lymphedema, not elsewhere classified  Rationale for Evaluation and Treatment: Rehabilitation  ONSET DATE: 1 yr ago without known precipitating event- by report  SUBJECTIVE:  SUBJECTIVE STATEMENT:Sharon Ware presents for OT treatment to address BLE lymphedema, L>R. Pt is accompanied by her spouse, Sharon Ware, who assisted her with transfer onto the Rx bed and who will learn to assist with compression wrapping. Pt has no new complaints.    Tobacco Use: Low Risk  (09/09/2022)   Patient History    Smoking Tobacco Use: Never    Smokeless Tobacco Use: Never    Passive Exposure: Not on file   PERTINENT HISTORY: HTN, Obesity w BMI 60-69.9, DM, B knee arthritis, Hx L breast cancer, recurrent L leg cellulitis. MRSA test results pending  PAIN: Are you having pain? Denies LE related pain  PRECAUTIONS: Fall and Other: lymphedema precautions, DM. Hx L Br Ca  PRIOR LEVEL OF FUNCTION: Independent with household mobility with device, Requires assistive device for independence, Needs assistance with homemaking, Needs assistance with gait, and Needs assistance with transfers  PATIENT GOALS: ... Get improvement for my condition...., keep lymphedema from getting worse, and reduce infection risk"  LYMPHEDEMA ASSESSMENTS:  FOTO Functional Outcomes Measure: Initial 33%  Lymphedema Life Impact Scale (LLIS): 22.06%  BLE COMPARATIVE LIMB VOLUMETRICS Initial 09/14/22  LANDMARK RIGHT  11/25/21  R LEG (A-D) 7717.1 ml  R THIGH (E-G) ml  R FULL LIMB (A-G) ml  Limb Volume differential (LVD)  %  Volume change since initial %  Volume change overall V  (Blank rows =  not tested)  LANDMARK LEFT  11/25/21  R LEG (A-D) 7752.1 ml  R THIGH (E-G) ml  R FULL LIMB (A-G) ml  Limb Volume differential (LVD)  Leg LVD measures 0.5%,   Volume change since initial %  Volume change overall %  (Blank rows = not tested)   TODAY'S TREATMENT:                                                                                                                                         BLE comparative limb volumetrics Pt and family edu for LE self-management  PATIENT EDUCATION:  Continued Pt/ CG edu for lymphedema self care home program throughout session. Topics include outcome of comparative limb volumetrics- starting limb volume differentials (LVDs), technology and gradient techniques used for short stretch, multilayer compression wrapping, simple self-MLD, therapeutic lymphatic pumping exercises, skin/nail care, LE precautions,. compression garment recommendations and specifications, wear and care schedule and compression garment donning / doffing w assistive devices. Discussed progress towards all OT goals since commencing CDT. All questions answered to the Pt's satisfaction. Good return. Person educated: Patient and Spouse Education method: Explanation, Demonstration, and Handouts Education comprehension: verbalized understanding, returned demonstration, verbal cues required, and needs further education  Lymphedema HOME PROGRAM:Complete Decongestive Therapy (CDT) Self-Management Phase Daily simple self-MLD Daily BLE skin care and inspection Daily lymphatic pumping there ex Appropriate daily compression- knee length, will require max caregiver A to  and doff Leg elevation when seated  ASSESSMENT: CLINICAL IMPRESSION: Emphasis of visit  on teaching Pt and spouse how to apply short stretch , multilayer compression bandages using gradient techniques for limb volume reduction. Pt required assistive device and some manual assist to re-position herself correctly on the treatment  bed. By end of session spouse is able to apply wraps with max assist, including tactile and verbal cues. Cont edu  for compression bandaging next session. Cont as per POC.  OBJECTIVE IMPAIRMENTS: Abnormal gait, decreased activity tolerance, decreased balance, decreased endurance, decreased knowledge of condition, decreased knowledge of use of DME, decreased mobility, difficulty walking, decreased ROM, decreased strength, increased edema, impaired perceived functional ability, postural dysfunction, obesity, pain, and chronic progressive limb swelling with elevated infection risk and risk of non-healing wounds .   ACTIVITY LIMITATIONS: carrying, lifting, bending, sitting, standing, squatting, sleeping, stairs, transfers, bed mobility, bathing, toileting, dressing, hygiene/grooming, and caring for others  PARTICIPATION LIMITATIONS: meal prep, cleaning, laundry, driving, shopping, community activity, occupation, yard work, and church  PERSONAL FACTORS: 3+ comorbidities: HTN, Stage III Obesity, B knee arthritis pain and inflammation and impaired AROM 2/2 body habitus  are also affecting patient's functional outcome.   REHAB POTENTIAL:  Fair with daily CG assistance throughout Intensive Phase CDT.  Poor without daily CG assistance with all LE self care home program components  GOALS: Goals reviewed with patient? Yes  SHORT TERM GOALS: Target date: 4th OT Rx visit   Pt will demonstrate understanding of lymphedema precautions and prevention strategies with modified independence using a printed reference to identify at least 5 precautions and discussing how s/he may implement them into daily life to reduce risk of progression with modified assistance ( printed reference). Baseline: Max A Goal status: Initial  2.  Pt will be able to apply multilayer, thigh length, compression wraps using gradient techniques with Max caregiver assistance to decrease limb volume, to limit infection risk, and to limit  lymphedema progression.  Baseline: Dependent Goal status: Initial  LONG TERM GOALS: Target date: 12/06/22  Given this patient's Intake score of 22.06 % on the Lymphedema Life Impact Scale (LLIS), patient will experience a reduction of at least 5 points in her perceived level of functional impairment resulting from lymphedema to improve functional performance and quality of life (QOL). Baseline: 22.06 % Goal status:Initial  2.  Given this patient's Intake score of TBA/100% on the functional outcomes FOTO tool, patient will experience an increase in function of 3 points to improve basic and instrumental ADLs performance, including lymphedema self-care.  Baseline: TBA% Goal status: INITIAL  3.  During Intensive phase CDT Pt will achieve at least 85% compliance with all lymphedema self-care home program components, including  daily skin care, multilayer , gradient compression wraps with daily changes, daily simple self MLD and daily lymphatic pumping therex to achieve optimal clinical outcome and to habituate self care regime for optimal LE self-management over time. Baseline: Dependent Goal status:INITIAL  4.  Pt will achieve at least a 10% volume reductions bilaterally below the knees to return limb to more typical size and shape, to limit infection risk and LE progression, to decrease pain, to improve function, and to improve body image and QOL. Baseline: Dependent Goal status:INITIAL  5.  Pt will be able to don and doff appropriate compression garments and devices using assistive devices and extra time and Max CG assistance within 1 week of issue date for optimal lymphedema self-care. Baseline: Dependent Goal status: INITIAL PLAN:  PT FREQUENCY: 2 x/week  PT DURATION: 12  weeks other: and PRN  PLANNED INTERVENTIONS: Therapeutic  exercises, Therapeutic activity, Patient/Family education, Self Care, DME instructions, Manual lymph drainage, Compression bandaging, Manual therapy, and skin  care during MLD, fit with appropriate custom compression garments and devices, fit with appropriate compression device  which  follows lymphatic pathways and anatomic distribution  PLAN FOR NEXT SESSION:  LLE bandages Pt/family edu for self-bandaging  Loel Dubonnet, MS, OTR/L, CLT-LANA 09/16/22 12:19 PM

## 2022-09-18 ENCOUNTER — Ambulatory Visit
Admission: RE | Admit: 2022-09-18 | Discharge: 2022-09-18 | Disposition: A | Discharge status: Home / Self Care | Payer: Medicare Other | Source: Ambulatory Visit | Attending: Emergency Medicine | Admitting: Emergency Medicine

## 2022-09-18 VITALS — BP 120/64 | HR 91 | Temp 98.5°F | Resp 22 | Ht 63.0 in | Wt 354.9 lb

## 2022-09-18 DIAGNOSIS — N390 Urinary tract infection, site not specified: Secondary | ICD-10-CM

## 2022-09-18 DIAGNOSIS — B3731 Acute candidiasis of vulva and vagina: Secondary | ICD-10-CM

## 2022-09-18 HISTORY — DX: Lymphedema, not elsewhere classified: I89.0

## 2022-09-18 LAB — URINALYSIS, W/ REFLEX TO CULTURE (INFECTION SUSPECTED)
Bilirubin Urine: NEGATIVE
Glucose, UA: 500 mg/dL — AB
Nitrite: POSITIVE — AB
Protein, ur: 100 mg/dL — AB
Specific Gravity, Urine: 1.025 (ref 1.005–1.030)
WBC, UA: >50 WBC/hpf (ref 0–5)
pH: 5.5 (ref 5.0–8.0)

## 2022-09-18 MED ORDER — CEPHALEXIN 500 MG PO CAPS: 500.0000 mg | ORAL_CAPSULE | Freq: Two times a day (BID) | ORAL | 0 refills | Status: AC

## 2022-09-18 MED ORDER — FLUCONAZOLE 150 MG PO TABS: 150.0000 mg | ORAL_TABLET | ORAL | 0 refills | Status: DC

## 2022-09-18 MED ORDER — PHENAZOPYRIDINE HCL 200 MG PO TABS: 200.0000 mg | ORAL_TABLET | Freq: Three times a day (TID) | ORAL | 0 refills | Status: DC

## 2022-09-18 NOTE — ED Notes (Signed)
Husband at bedside.  

## 2022-09-18 NOTE — ED Provider Notes (Signed)
[facility]    CSN: [id number] Arrival date & time: 09/18/22  4098      History   Chief Complaint Chief Complaint  Patient presents with   Dysuria    HPI Sharon Ware is a 72 y.o. female.   HPI  72 year old female with a past medical history significant for breast cancer, diabetes, hyperlipidemia, hypertension, lymphedema, and vitamin D deficiency presents for evaluation of intermittent discomfort with urination has been going on for the past week.  She developed urgency and frequency of urination 2 days ago and cloudy urine yesterday.  She denies any fever, nausea, vomiting, or low back pain.  Patient did bring a specimen from home that had been collected 2 hours prior to her arrival that was very cloudy in appearance.  We did advise the patient that we cannot except that specimen and asked her to provide one here in clinic.  Past Medical History:  Diagnosis Date   Arthritis    Breast cancer (HCC)    Cataract    Diabetes mellitus without complication (HCC)    Hyperlipidemia    Hypertension    Lymphedema    Vitamin D deficiency     Patient Active Problem List   Diagnosis Date Noted   Cellulitis 08/04/2022   Edema 01/30/2021   Malignant neoplasm of upper-inner quadrant of left breast in female, estrogen receptor positive (HCC) 12/01/2020   Goals of care, counseling/discussion 12/01/2020   Morbid obesity (HCC) 08/08/2018   BMI 60.0-69.9, adult (HCC) 08/08/2018   Controlled diabetes mellitus type 2 with complications (HCC) 08/08/2018   Hyperlipidemia 08/08/2018   Hypertension associated with diabetes (HCC) 08/08/2018   Arthritis of both knees 08/08/2018   Vitamin D deficiency 08/08/2018    Past Surgical History:  Procedure Laterality Date   ABDOMINAL HYSTERECTOMY     x2   BREAST BIOPSY Right 1990s   neg   BREAST BIOPSY Right 2000s   neg   BREAST BIOPSY Left 11/24/2020   u/s bx 10:30/8 cmfn-"heart" marker   BREAST EXCISIONAL BIOPSY Right     neg   BREAST LUMPECTOMY     BREAST LUMPECTOMY WITH RADIOFREQUENCY TAG IDENTIFICATION Left 12/10/2020   Procedure: BREAST LUMPECTOMY WITH RADIOFREQUENCY TAG IDENTIFICATION;  Surgeon: Campbell Lerner, MD;  Location: [facility] ORS;  Service: General;  Laterality: Left;   DILATION AND CURETTAGE OF UTERUS     EYE SURGERY  2004   cataract surgery and laser surgery   Salpingo-oophorectomy      OB History   No obstetric history on file.      Home Medications    Prior to Admission medications   Medication Sig Start Date End Date Taking? Authorizing Provider  cephALEXin (KEFLEX) 500 MG capsule Take 1 capsule (500 mg total) by mouth 2 (two) times daily for 5 days. 09/18/22 09/23/22 Yes Becky Augusta, NP  fluconazole (DIFLUCAN) 150 MG tablet Take 1 tablet (150 mg total) by mouth every 3 (three) days for 3 doses. 09/18/22 09/25/22 Yes Becky Augusta, NP  phenazopyridine (PYRIDIUM) 200 MG tablet Take 1 tablet (200 mg total) by mouth 3 (three) times daily. 09/18/22  Yes Becky Augusta, NP  acetaminophen (TYLENOL) 650 MG CR tablet Take 650 mg by mouth every 8 (eight) hours as needed for pain.    [provider]  aspirin 81 MG tablet Take 81 mg by mouth daily.    [provider]  Calcium Citrate-Vitamin D (CALCIUM CITRATE + D3 PO) Take 1,200 mg by mouth daily.  [provider]  Cholecalciferol (VITAMIN D3) 125 MCG (5000 UT) TABS Take 5,000 Units by mouth daily.    [provider]  dapagliflozin propanediol (FARXIGA) 10 MG TABS tablet Take 1 tablet (10 mg total) by mouth daily before breakfast. 07/13/22   Laural Benes, Megan P, DO  Glucosamine HCl 1500 MG TABS Take 1,500 mg by mouth in the morning and at bedtime.    [provider]  letrozole (FEMARA) 2.5 MG tablet Take 1 tablet (2.5 mg total) by mouth daily. 04/12/22   Creig Hines, MD  losartan-hydrochlorothiazide (HYZAAR) 50-12.5 MG tablet Take 1 tablet by mouth daily. 04/13/22   Johnson, Megan P, DO  lovastatin (MEVACOR) 20  MG tablet TAKE 1 TABLET BY MOUTH EVERYDAY AT BEDTIME 04/13/22   Johnson, Megan P, DO  Menthol, Topical Analgesic, (ICY HOT EX) Apply 1 application topically daily as needed (pain).    [provider]  metFORMIN (GLUCOPHAGE-XR) 500 MG 24 hr tablet Take 2 tablets (1,000 mg total) by mouth 2 (two) times daily with a meal. 04/13/22   Johnson, Megan P, DO  Omega-3 Fatty Acids (FISH OIL ULTRA) 1400 MG CAPS Take 1,400 mg by mouth daily.    [provider]  spironolactone (ALDACTONE) 50 MG tablet Take 1 tablet (50 mg total) by mouth daily. 04/13/22   Dorcas Carrow, DO    Family History Family History  Problem Relation Age of Onset   Breast cancer Mother 80   Heart disease Father    Cancer Paternal Grandfather    Heart disease Paternal Grandfather     Social History Social History   Tobacco Use   Smoking status: Never   Smokeless tobacco: Never  Vaping Use   Vaping Use: Never used  Substance Use Topics   Alcohol use: Not Currently   Drug use: Never     Allergies   Other, Wound dressing adhesive, Morphine and codeine, and Latex   Review of Systems Review of Systems  Constitutional:  Negative for fever.  Genitourinary:  Positive for dysuria, frequency and urgency. Negative for hematuria.  Musculoskeletal:  Negative for back pain.     Physical Exam Triage Vital Signs ED Triage Vitals [09/18/22 1018]  Enc Vitals Group     BP      Pulse      Resp      Temp      Temp src      SpO2      Weight (!) 354 lb 15.1 oz (161 kg)     Height 5\' 3"  (1.6 m)     Head Circumference      Peak Flow      Pain Score 0     Pain Loc      Pain Edu?      Excl. in GC?    No data found.  Updated Vital Signs BP 120/64 (BP Location: Right Arm)   Pulse 91   Temp 98.5 F (36.9 C) (Oral)   Resp (!) 22   Ht 5\' 3"  (1.6 m)   Wt (!) 354 lb 15.1 oz (161 kg)   SpO2 93%   BMI 62.87 kg/m   Visual Acuity Right Eye Distance:   Left Eye Distance:   Bilateral Distance:    Right  Eye Near:   Left Eye Near:    Bilateral Near:     Physical Exam Vitals and nursing note reviewed.  Constitutional:      Appearance: Normal appearance. She is obese.  HENT:  Head: Normocephalic and atraumatic.  Skin:    General: Skin is warm and dry.     Capillary Refill: Capillary refill takes less than 2 seconds.  Neurological:     General: No focal deficit present.     Mental Status: She is alert and oriented to person, place, and time.      UC Treatments / Results  Labs (all labs ordered are listed, but only abnormal results are displayed) Labs Reviewed  URINALYSIS, W/ REFLEX TO CULTURE (INFECTION SUSPECTED) - Abnormal; Notable for the following components:      Result Value   APPearance CLOUDY (*)    Glucose, UA 500 (*)    Hgb urine dipstick MODERATE (*)    Ketones, ur TRACE (*)    Protein, ur 100 (*)    Nitrite POSITIVE (*)    Leukocytes,Ua SMALL (*)    Non Squamous Epithelial PRESENT (*)    Bacteria, UA MANY (*)    All other components within normal limits  URINE CULTURE    EKG   Radiology No results found.  Procedures Procedures (including critical care time)  Medications Ordered in UC Medications - No data to display  Initial Impression / Assessment and Plan / UC Course  I have reviewed the triage vital signs and the nursing notes.  Pertinent labs & imaging results that were available during my care of the patient were reviewed by me and considered in my medical decision making (see chart for details).   Patient is a nontoxic-appearing 72 year old female who is wheelchair-bound presenting for evaluation of UTI symptoms as outlined in HPI above.  Patient was able to provide a specimen in the clinic which was leukocyte esterase and nitrite positive.  Patient also showed moderate hemoglobin, trace ketones, and 100 protein.  Glucose level is 500 and the patient is on [place].  Reflex microscopy shows non-squamous epithelials, >50 WBCs, 11-20 RBCs, many  bacteria, and budding yeast.  The patient has recently been on 3 separate rounds of antibiotics in the last 36 days for cellulitis so I anticipate that she is developing a vaginal yeast infection which I will treat with Diflucan 150 mg tablets, 1 tablet now and repeat dosing every 3 days.  Her urine will reflex to culture and I will start her on Keflex 500 mg twice daily for 5 days for her UTI.  I have also prescribed Pyridium to help with urinary discomfort.  If her symptoms worsen she is to return for reevaluation.  ER precautions also reviewed.   Final Clinical Impressions(s) / UC Diagnoses   Final diagnoses:  Lower urinary tract infectious disease  Vaginal yeast infection     Discharge Instructions      Take the Keflex twice daily for 5 days with food for treatment of urinary tract infection.  Use the Pyridium every 8 hours as needed for urinary discomfort.  This will turn your urine a bright red-orange.  Increase your oral fluid intake so that you increase your urine production and or flushing your urinary system.  Take an over-the-counter probiotic, such as Culturelle-Align-Activia, 1 hour after each dose of antibiotic to prevent diarrhea or yeast infections from forming.  We will culture urine and change the antibiotics if necessary.  Take one Diflucan 150 mg now and repeat dosing every 3 days for 3 doses.  Return for reevaluation, or see your primary care provider, for any new or worsening symptoms.      ED Prescriptions     Medication Sig Dispense  Auth. Provider   cephALEXin (KEFLEX) 500 MG capsule Take 1 capsule (500 mg total) by mouth 2 (two) times daily for 5 days. 10 capsule Becky Augusta, NP   phenazopyridine (PYRIDIUM) 200 MG tablet Take 1 tablet (200 mg total) by mouth 3 (three) times daily. 6 tablet Becky Augusta, NP   fluconazole (DIFLUCAN) 150 MG tablet Take 1 tablet (150 mg total) by mouth every 3 (three) days for 3 doses. 3 tablet Becky Augusta, NP      PDMP  not reviewed this encounter.   Becky Augusta, NP 09/18/22 1106

## 2022-09-18 NOTE — Discharge Instructions (Signed)
Take the Keflex twice daily for 5 days with food for treatment of urinary tract infection.  Use the Pyridium every 8 hours as needed for urinary discomfort.  This will turn your urine a bright red-orange.  Increase your oral fluid intake so that you increase your urine production and or flushing your urinary system.  Take an over-the-counter probiotic, such as Culturelle-Align-Activia, 1 hour after each dose of antibiotic to prevent diarrhea or yeast infections from forming.  We will culture urine and change the antibiotics if necessary.  Take one Diflucan 150 mg now and repeat dosing every 3 days for 3 doses.  Return for reevaluation, or see your primary care provider, for any new or worsening symptoms.

## 2022-09-18 NOTE — ED Triage Notes (Signed)
One week of urinary symptoms including dysuria and now has cloudy urine.

## 2022-09-19 LAB — URINE CULTURE: Culture: >=100000 — AB

## 2022-09-20 ENCOUNTER — Telehealth (HOSPITAL_COMMUNITY): Payer: Self-pay | Admitting: Emergency Medicine

## 2022-09-20 ENCOUNTER — Ambulatory Visit: Payer: Medicare Other | Admitting: Occupational Therapy

## 2022-09-20 ENCOUNTER — Encounter: Payer: Self-pay | Admitting: Occupational Therapy

## 2022-09-20 DIAGNOSIS — I890 Lymphedema, not elsewhere classified: Secondary | ICD-10-CM

## 2022-09-20 LAB — URINE CULTURE

## 2022-09-20 MED ORDER — FOSFOMYCIN TROMETHAMINE 3 G PO PACK: 3.0000 g | PACK | Freq: Once | ORAL | 0 refills | Status: AC

## 2022-09-20 NOTE — Therapy (Signed)
OUTPATIENT OCCUPATIONAL THERAPY TREATMENT  BILATERAL LOWER EXTREMITY LYMPHEDEMA  Patient Name: Sharon Ware MRN: [id number] DOB:[birth year], 72 y.o., female Today's Date: 09/20/2022  END OF SESSION:   OT End of Session - 09/20/22 1602     Visit Number 4    Number of Visits 36    Date for OT Re-Evaluation 12/06/22    OT Start Time 0100    OT Stop Time 0205    OT Time Calculation (min) 65 min    Activity Tolerance Patient tolerated treatment well;No increased pain    Behavior During Therapy WFL for tasks assessed/performed              Past Medical History:  Diagnosis Date   Arthritis    Breast cancer (HCC)    Cataract    Diabetes mellitus without complication (HCC)    Hyperlipidemia    Hypertension    Lymphedema    Vitamin D deficiency    Past Surgical History:  Procedure Laterality Date   ABDOMINAL HYSTERECTOMY     x2   BREAST BIOPSY Right 1990s   neg   BREAST BIOPSY Right 2000s   neg   BREAST BIOPSY Left 11/24/2020   u/s bx 10:30/8 cmfn-"heart" marker   BREAST EXCISIONAL BIOPSY Right    neg   BREAST LUMPECTOMY     BREAST LUMPECTOMY WITH RADIOFREQUENCY TAG IDENTIFICATION Left 12/10/2020   Procedure: BREAST LUMPECTOMY WITH RADIOFREQUENCY TAG IDENTIFICATION;  Surgeon: Campbell Lerner, MD;  Location: [facility] ORS;  Service: General;  Laterality: Left;   DILATION AND CURETTAGE OF UTERUS     EYE SURGERY  2004   cataract surgery and laser surgery   Salpingo-oophorectomy     Patient Active Problem List   Diagnosis Date Noted   Cellulitis 08/04/2022   Edema 01/30/2021   Malignant neoplasm of upper-inner quadrant of left breast in female, estrogen receptor positive (HCC) 12/01/2020   Goals of care, counseling/discussion 12/01/2020   Morbid obesity (HCC) 08/08/2018   BMI 60.0-69.9, adult (HCC) 08/08/2018   Controlled diabetes mellitus type 2 with complications (HCC) 08/08/2018   Hyperlipidemia 08/08/2018   Hypertension associated with diabetes (HCC)  08/08/2018   Arthritis of both knees 08/08/2018   Vitamin D deficiency 08/08/2018   PCP: Olevia Perches, DO  REFERRING PROVIDER: Gwynn Burly, DO  REFERRING DIAG: I89.0  THERAPY DIAG:  Lymphedema, not elsewhere classified  Rationale for Evaluation and Treatment: Rehabilitation  ONSET DATE: 1 yr ago without known precipitating event- by report  SUBJECTIVE:  SUBJECTIVE STATEMENT:Sharon Ware presents for OT treatment to address BLE lymphedema, L>R. Pt is accompanied by her spouse, [name], who assisted her with transfer onto the Rx bed and who is learning to assist with compression wrapping. Pt has no new complaints. She states she tolerated LLE knee length compression wraps for 24 hours after being wrapped at last session without difficulty. [name] states he has no practiced wrapping over the visit interval.    Tobacco Use: Low Risk  (09/20/2022)   Patient History    Smoking Tobacco Use: Never    Smokeless Tobacco Use: Never    Passive Exposure: Not on file   PERTINENT HISTORY: HTN, Obesity w BMI 60-69.9, DM, B knee arthritis, Hx L breast cancer, recurrent L leg cellulitis. MRSA test results pending  PAIN: Are you having pain? Denies LE related pain  PRECAUTIONS: Fall and Other: lymphedema precautions, DM. Hx L Br Ca  PRIOR LEVEL OF FUNCTION: Independent with household mobility with device, Requires assistive device for independence, Needs assistance with homemaking, Needs assistance with gait, and Needs assistance with transfers  PATIENT GOALS: ... Get improvement for my condition...., keep lymphedema from getting worse, and reduce infection risk"  LYMPHEDEMA ASSESSMENTS:  FOTO Functional Outcomes Measure: Initial 33%  Lymphedema Life Impact Scale (LLIS): 22.06%  BLE COMPARATIVE LIMB VOLUMETRICS  Initial 09/14/22  LANDMARK RIGHT  11/25/21  R LEG (A-D) 7717.1 ml  R THIGH (E-G) ml  R FULL LIMB (A-G) ml  Limb Volume differential (LVD)  %  Volume change since initial %  Volume change overall V  (Blank rows = not tested)  LANDMARK LEFT  11/25/21  R LEG (A-D) 7752.1 ml  R THIGH (E-G) ml  R FULL LIMB (A-G) ml  Limb Volume differential (LVD)  Leg LVD measures 0.5%,   Volume change since initial %  Volume change overall %  (Blank rows = not tested)   TODAY'S TREATMENT:                                                                                                                                         BLE comparative limb volumetrics Pt and family edu for LE self-management  PATIENT EDUCATION:  Continued Pt/ CG edu for lymphedema self care home program throughout session. Topics include outcome of comparative limb volumetrics- starting limb volume differentials (LVDs), technology and gradient techniques used for short stretch, multilayer compression wrapping, simple self-MLD, therapeutic lymphatic pumping exercises, skin/nail care, LE precautions,. compression garment recommendations and specifications, wear and care schedule and compression garment donning / doffing w assistive devices. Discussed progress towards all OT goals since commencing CDT. All questions answered to the Pt's satisfaction. Good return. Person educated: Patient and Spouse Education method: Explanation, Demonstration, and Handouts Education comprehension: verbalized understanding, returned demonstration, verbal cues required, and needs further education  Lymphedema HOME PROGRAM:Complete Decongestive Therapy (CDT) Self-Management Phase Daily simple self-MLD Daily BLE skin care and  inspection Daily lymphatic pumping there ex Appropriate daily compression- knee length, will require max caregiver A to  and doff Leg elevation when seated  ASSESSMENT: CLINICAL IMPRESSION: Commenced LLE/LLQ MLD with simultaneous skin  care utilizing functional inguinal LN, deep abdominal lymphatics, thoracic duct to hear. Pt tolerated will without dizziness, nausea, SOB. Emphasis of LT edu today on short stretch , multilayer compression bandaging again today, with intro level lymphatic structure and function related to MLD.  Pt required assistive device and some manual assist to re-position herself correctly on the treatment bed. By end of session spouse is able to apply wraps with max assist, including tactile and verbal cues. Cont edu  for compression bandaging next session. Cont as per POC.  OBJECTIVE IMPAIRMENTS: Abnormal gait, decreased activity tolerance, decreased balance, decreased endurance, decreased knowledge of condition, decreased knowledge of use of DME, decreased mobility, difficulty walking, decreased ROM, decreased strength, increased edema, impaired perceived functional ability, postural dysfunction, obesity, pain, and chronic progressive limb swelling with elevated infection risk and risk of non-healing wounds .   ACTIVITY LIMITATIONS: carrying, lifting, bending, sitting, standing, squatting, sleeping, stairs, transfers, bed mobility, bathing, toileting, dressing, hygiene/grooming, and caring for others  PARTICIPATION LIMITATIONS: meal prep, cleaning, laundry, driving, shopping, community activity, occupation, yard work, and church  PERSONAL FACTORS: 3+ comorbidities: HTN, Stage III Obesity, B knee arthritis pain and inflammation and impaired AROM 2/2 body habitus  are also affecting patient's functional outcome.   REHAB POTENTIAL:  Fair with daily CG assistance throughout Intensive Phase CDT.  Poor without daily CG assistance with all LE self care home program components  GOALS: Goals reviewed with patient? Yes  SHORT TERM GOALS: Target date: 4th OT Rx visit   Pt will demonstrate understanding of lymphedema precautions and prevention strategies with modified independence using a printed reference to identify  at least 5 precautions and discussing how s/he may implement them into daily life to reduce risk of progression with modified assistance ( printed reference). Baseline: Max A Goal status: Initial  2.  Pt will be able to apply multilayer, thigh length, compression wraps using gradient techniques with Max caregiver assistance to decrease limb volume, to limit infection risk, and to limit lymphedema progression.  Baseline: Dependent Goal status: Initial  LONG TERM GOALS: Target date: 12/06/22  Given this patient's Intake score of 22.06 % on the Lymphedema Life Impact Scale (LLIS), patient will experience a reduction of at least 5 points in her perceived level of functional impairment resulting from lymphedema to improve functional performance and quality of life (QOL). Baseline: 22.06 % Goal status:Initial  2.  Given this patient's Intake score of TBA/100% on the functional outcomes FOTO tool, patient will experience an increase in function of 3 points to improve basic and instrumental ADLs performance, including lymphedema self-care.  Baseline: TBA% Goal status: INITIAL  3.  During Intensive phase CDT Pt will achieve at least 85% compliance with all lymphedema self-care home program components, including  daily skin care, multilayer , gradient compression wraps with daily changes, daily simple self MLD and daily lymphatic pumping therex to achieve optimal clinical outcome and to habituate self care regime for optimal LE self-management over time. Baseline: Dependent Goal status:INITIAL  4.  Pt will achieve at least a 10% volume reductions bilaterally below the knees to return limb to more typical size and shape, to limit infection risk and LE progression, to decrease pain, to improve function, and to improve body image and QOL. Baseline: Dependent Goal status:INITIAL  5.  Pt will be able to don and doff appropriate compression garments and devices using assistive devices and extra time and  Max CG assistance within 1 week of issue date for optimal lymphedema self-care. Baseline: Dependent Goal status: INITIAL PLAN:  PT FREQUENCY: 2 x/week  PT DURATION: 12  weeks other: and PRN  PLANNED INTERVENTIONS: Therapeutic exercises, Therapeutic activity, Patient/Family education, Self Care, DME instructions, Manual lymph drainage, Compression bandaging, Manual therapy, and skin care during MLD, fit with appropriate custom compression garments and devices, fit with appropriate compression device  which  follows lymphatic pathways and anatomic distribution  PLAN FOR NEXT SESSION:  LLE bandages Pt/family edu for self-bandaging  Loel Dubonnet, MS, OTR/L, CLT-LANA 09/20/22 4:04 PM

## 2022-09-25 ENCOUNTER — Encounter: Payer: Self-pay | Admitting: Radiology

## 2022-09-25 ENCOUNTER — Inpatient Hospital Stay
Admission: EM | Admit: 2022-09-25 | Discharge: 2022-09-29 | DRG: 690 | Disposition: A | Discharge status: Home / Self Care | Payer: Medicare Other | Attending: Internal Medicine | Admitting: Internal Medicine

## 2022-09-25 ENCOUNTER — Inpatient Hospital Stay: Payer: Medicare Other

## 2022-09-25 ENCOUNTER — Other Ambulatory Visit: Payer: Self-pay

## 2022-09-25 DIAGNOSIS — Z91048 Other nonmedicinal substance allergy status: Secondary | ICD-10-CM

## 2022-09-25 DIAGNOSIS — Z79811 Long term (current) use of aromatase inhibitors: Secondary | ICD-10-CM

## 2022-09-25 DIAGNOSIS — E1159 Type 2 diabetes mellitus with other circulatory complications: Secondary | ICD-10-CM | POA: Diagnosis present

## 2022-09-25 DIAGNOSIS — C50212 Malignant neoplasm of upper-inner quadrant of left female breast: Secondary | ICD-10-CM | POA: Diagnosis present

## 2022-09-25 DIAGNOSIS — Z8249 Family history of ischemic heart disease and other diseases of the circulatory system: Secondary | ICD-10-CM

## 2022-09-25 DIAGNOSIS — Z803 Family history of malignant neoplasm of breast: Secondary | ICD-10-CM

## 2022-09-25 DIAGNOSIS — Z91018 Allergy to other foods: Secondary | ICD-10-CM

## 2022-09-25 DIAGNOSIS — B9689 Other specified bacterial agents as the cause of diseases classified elsewhere: Secondary | ICD-10-CM | POA: Diagnosis not present

## 2022-09-25 DIAGNOSIS — N179 Acute kidney failure, unspecified: Secondary | ICD-10-CM

## 2022-09-25 DIAGNOSIS — I152 Hypertension secondary to endocrine disorders: Secondary | ICD-10-CM | POA: Diagnosis present

## 2022-09-25 DIAGNOSIS — E662 Morbid (severe) obesity with alveolar hypoventilation: Secondary | ICD-10-CM | POA: Diagnosis present

## 2022-09-25 DIAGNOSIS — Z7984 Long term (current) use of oral hypoglycemic drugs: Secondary | ICD-10-CM

## 2022-09-25 DIAGNOSIS — E118 Type 2 diabetes mellitus with unspecified complications: Secondary | ICD-10-CM | POA: Diagnosis present

## 2022-09-25 DIAGNOSIS — E785 Hyperlipidemia, unspecified: Secondary | ICD-10-CM | POA: Diagnosis present

## 2022-09-25 DIAGNOSIS — I890 Lymphedema, not elsewhere classified: Secondary | ICD-10-CM | POA: Diagnosis present

## 2022-09-25 DIAGNOSIS — N390 Urinary tract infection, site not specified: Secondary | ICD-10-CM | POA: Diagnosis present

## 2022-09-25 DIAGNOSIS — E1169 Type 2 diabetes mellitus with other specified complication: Secondary | ICD-10-CM | POA: Diagnosis present

## 2022-09-25 DIAGNOSIS — B961 Klebsiella pneumoniae [K. pneumoniae] as the cause of diseases classified elsewhere: Secondary | ICD-10-CM | POA: Diagnosis present

## 2022-09-25 DIAGNOSIS — Z79899 Other long term (current) drug therapy: Secondary | ICD-10-CM | POA: Diagnosis not present

## 2022-09-25 DIAGNOSIS — Z923 Personal history of irradiation: Secondary | ICD-10-CM | POA: Diagnosis not present

## 2022-09-25 DIAGNOSIS — Z7982 Long term (current) use of aspirin: Secondary | ICD-10-CM

## 2022-09-25 DIAGNOSIS — Z90710 Acquired absence of both cervix and uterus: Secondary | ICD-10-CM | POA: Diagnosis not present

## 2022-09-25 DIAGNOSIS — R0902 Hypoxemia: Secondary | ICD-10-CM

## 2022-09-25 DIAGNOSIS — Z6841 Body Mass Index (BMI) 40.0 and over, adult: Secondary | ICD-10-CM | POA: Diagnosis not present

## 2022-09-25 DIAGNOSIS — N3000 Acute cystitis without hematuria: Secondary | ICD-10-CM | POA: Diagnosis present

## 2022-09-25 DIAGNOSIS — Z91040 Latex allergy status: Secondary | ICD-10-CM

## 2022-09-25 DIAGNOSIS — N200 Calculus of kidney: Secondary | ICD-10-CM | POA: Diagnosis present

## 2022-09-25 DIAGNOSIS — Z1612 Extended spectrum beta lactamase (ESBL) resistance: Secondary | ICD-10-CM | POA: Diagnosis present

## 2022-09-25 DIAGNOSIS — Z881 Allergy status to other antibiotic agents status: Secondary | ICD-10-CM

## 2022-09-25 DIAGNOSIS — N10 Acute tubulo-interstitial nephritis: Secondary | ICD-10-CM | POA: Diagnosis present

## 2022-09-25 DIAGNOSIS — I7121 Aneurysm of the ascending aorta, without rupture: Secondary | ICD-10-CM

## 2022-09-25 DIAGNOSIS — Z170 Estrogen receptor positive status [ER+]: Secondary | ICD-10-CM

## 2022-09-25 HISTORY — DX: Aneurysm of the ascending aorta, without rupture: I71.21

## 2022-09-25 LAB — CBC WITH DIFFERENTIAL/PLATELET
Abs Immature Granulocytes: 0.07 10*3/uL (ref 0.00–0.07)
Basophils Absolute: 0.1 10*3/uL (ref 0.0–0.1)
Basophils Relative: 1 %
Eosinophils Absolute: 0.2 10*3/uL (ref 0.0–0.5)
Eosinophils Relative: 2 %
HCT: 50.4 % — ABNORMAL HIGH (ref 36.0–46.0)
Hemoglobin: 16.1 g/dL — ABNORMAL HIGH (ref 12.0–15.0)
Immature Granulocytes: 1 %
Lymphocytes Relative: 26 %
Lymphs Abs: 3 10*3/uL (ref 0.7–4.0)
MCH: 29.9 pg (ref 26.0–34.0)
MCHC: 31.9 g/dL (ref 30.0–36.0)
MCV: 93.7 fL (ref 80.0–100.0)
Monocytes Absolute: 0.8 10*3/uL (ref 0.1–1.0)
Monocytes Relative: 7 %
Neutro Abs: 7.4 10*3/uL (ref 1.7–7.7)
Neutrophils Relative %: 63 %
Platelets: 222 10*3/uL (ref 150–400)
RBC: 5.38 MIL/uL — ABNORMAL HIGH (ref 3.87–5.11)
RDW: 13.8 % (ref 11.5–15.5)
WBC: 11.6 10*3/uL — ABNORMAL HIGH (ref 4.0–10.5)
nRBC: 0 % (ref 0.0–0.2)

## 2022-09-25 LAB — COMPREHENSIVE METABOLIC PANEL
ALT: 28 U/L (ref 0–44)
AST: 31 U/L (ref 15–41)
Albumin: 3.8 g/dL (ref 3.5–5.0)
Alkaline Phosphatase: 52 U/L (ref 38–126)
Anion gap: 15 (ref 5–15)
BUN: 30 mg/dL — ABNORMAL HIGH (ref 8–23)
CO2: 25 mmol/L (ref 22–32)
Calcium: 10 mg/dL (ref 8.9–10.3)
Chloride: 99 mmol/L (ref 98–111)
Creatinine, Ser: 1.09 mg/dL — ABNORMAL HIGH (ref 0.44–1.00)
GFR, Estimated: 53 mL/min — ABNORMAL LOW (ref >60)
Glucose, Bld: 144 mg/dL — ABNORMAL HIGH (ref 70–99)
Potassium: 4.4 mmol/L (ref 3.5–5.1)
Sodium: 139 mmol/L (ref 135–145)
Total Bilirubin: 0.7 mg/dL (ref 0.3–1.2)
Total Protein: 8.1 g/dL (ref 6.5–8.1)

## 2022-09-25 LAB — URINALYSIS, ROUTINE W REFLEX MICROSCOPIC
Bacteria, UA: NONE SEEN
Bilirubin Urine: NEGATIVE
Glucose, UA: >=500 mg/dL — AB
Hgb urine dipstick: NEGATIVE
Ketones, ur: NEGATIVE mg/dL
Nitrite: NEGATIVE
Protein, ur: 30 mg/dL — AB
Specific Gravity, Urine: 1.022 (ref 1.005–1.030)
WBC, UA: >50 WBC/hpf (ref 0–5)
pH: 6 (ref 5.0–8.0)

## 2022-09-25 LAB — GLUCOSE, CAPILLARY
Glucose-Capillary: 144 mg/dL — ABNORMAL HIGH (ref 70–99)
Glucose-Capillary: 169 mg/dL — ABNORMAL HIGH (ref 70–99)

## 2022-09-25 MED ORDER — ACETAMINOPHEN 650 MG RE SUPP: 650.0000 mg | Freq: Four times a day (QID) | RECTAL | Status: DC | PRN

## 2022-09-25 MED ORDER — SODIUM CHLORIDE 0.9% FLUSH
3.0000 mL | Freq: Two times a day (BID) | INTRAVENOUS | Status: DC
  Administered 2022-09-25 – 2022-09-29 (×7): 3 mL via INTRAVENOUS

## 2022-09-25 MED ORDER — PRAVASTATIN SODIUM 20 MG PO TABS
20.0000 mg | ORAL_TABLET | Freq: Every day | ORAL | Status: DC
  Administered 2022-09-25 – 2022-09-28 (×4): 20 mg via ORAL
  Filled 2022-09-25 (×4): qty 1

## 2022-09-25 MED ORDER — ASPIRIN 81 MG PO TBEC
81.0000 mg | DELAYED_RELEASE_TABLET | Freq: Every day | ORAL | Status: DC
  Administered 2022-09-26 – 2022-09-29 (×4): 81 mg via ORAL
  Filled 2022-09-25 (×4): qty 1

## 2022-09-25 MED ORDER — ACETAMINOPHEN 325 MG PO TABS: 650.0000 mg | ORAL_TABLET | Freq: Four times a day (QID) | ORAL | Status: DC | PRN

## 2022-09-25 MED ORDER — ONDANSETRON HCL 4 MG/2ML IJ SOLN: 4.0000 mg | Freq: Four times a day (QID) | INTRAMUSCULAR | Status: DC | PRN

## 2022-09-25 MED ORDER — SODIUM CHLORIDE 0.9 % IV SOLN
1.0000 g | Freq: Once | INTRAVENOUS | Status: AC
  Administered 2022-09-25: 1 g via INTRAVENOUS
  Filled 2022-09-25: qty 20

## 2022-09-25 MED ORDER — INSULIN ASPART 100 UNIT/ML IJ SOLN
0.0000 [IU] | Freq: Three times a day (TID) | INTRAMUSCULAR | Status: DC
  Administered 2022-09-25 – 2022-09-26 (×2): 2 [IU] via SUBCUTANEOUS
  Administered 2022-09-26: 3 [IU] via SUBCUTANEOUS
  Administered 2022-09-26: 2 [IU] via SUBCUTANEOUS
  Administered 2022-09-27 (×3): 3 [IU] via SUBCUTANEOUS
  Administered 2022-09-28: 5 [IU] via SUBCUTANEOUS
  Administered 2022-09-28 (×2): 3 [IU] via SUBCUTANEOUS
  Administered 2022-09-29: 2 [IU] via SUBCUTANEOUS
  Filled 2022-09-25 (×11): qty 1

## 2022-09-25 MED ORDER — LETROZOLE 2.5 MG PO TABS
2.5000 mg | ORAL_TABLET | Freq: Every day | ORAL | Status: DC
  Administered 2022-09-26 – 2022-09-29 (×4): 2.5 mg via ORAL
  Filled 2022-09-25 (×4): qty 1

## 2022-09-25 MED ORDER — ENOXAPARIN SODIUM 80 MG/0.8ML IJ SOSY
0.5000 mg/kg | PREFILLED_SYRINGE | INTRAMUSCULAR | Status: DC
  Administered 2022-09-25 – 2022-09-28 (×4): 80 mg via SUBCUTANEOUS
  Filled 2022-09-25 (×4): qty 0.8

## 2022-09-25 MED ORDER — SODIUM CHLORIDE 0.9 % IV SOLN
1.0000 g | Freq: Three times a day (TID) | INTRAVENOUS | Status: AC
  Administered 2022-09-25 – 2022-09-27 (×7): 1 g via INTRAVENOUS
  Filled 2022-09-25 (×7): qty 20

## 2022-09-25 MED ORDER — ONDANSETRON HCL 4 MG PO TABS: 4.0000 mg | ORAL_TABLET | Freq: Four times a day (QID) | ORAL | Status: DC | PRN

## 2022-09-25 MED ORDER — PRAVASTATIN SODIUM 20 MG PO TABS: 20.0000 mg | ORAL_TABLET | Freq: Every day | ORAL | Status: DC

## 2022-09-25 MED ORDER — ENOXAPARIN SODIUM 40 MG/0.4ML IJ SOSY: 40.0000 mg | PREFILLED_SYRINGE | INTRAMUSCULAR | Status: DC

## 2022-09-25 MED ORDER — POLYETHYLENE GLYCOL 3350 17 G PO PACK: 17.0000 g | PACK | Freq: Every day | ORAL | Status: DC | PRN

## 2022-09-25 MED ORDER — LACTATED RINGERS IV BOLUS
1000.0000 mL | Freq: Once | INTRAVENOUS | Status: AC
  Administered 2022-09-25: 1000 mL via INTRAVENOUS

## 2022-09-25 NOTE — Assessment & Plan Note (Addendum)
Patient is presenting with approximately ~14 day history of dysuria, urgency and frequency with previous urine culture growing multidrug-resistant Klebsiella.  Patient has not had any improvement after dose of fosfomycin.  - Meropenem per pharmacy dosing - Tylenol as needed for fever

## 2022-09-25 NOTE — ED Notes (Signed)
Advised nurse that patient has ready bed 

## 2022-09-25 NOTE — Progress Notes (Signed)
PHARMACIST - PHYSICIAN COMMUNICATION  CONCERNING:  Enoxaparin (Lovenox) for DVT Prophylaxis    RECOMMENDATION: Patient was prescribed enoxaprin 40mg  q24 hours for VTE prophylaxis.   Filed Weights   09/25/22 1248  Weight: (!) 160.6 kg (354 lb)    Body mass index is 62.71 kg/m.  Estimated Creatinine Clearance: 68.4 mL/min (A) (by C-G formula based on SCr of 1.09 mg/dL (H)).   Based on [facility] policy patient is candidate for enoxaparin 0.5mg /kg TBW SQ every 24 hours based on BMI being >30.  DESCRIPTION: Pharmacy has adjusted enoxaparin dose per [facility] policy.  Patient is now receiving enoxaparin 80 mg every 24 hours    Foye Deer, PharmD Clinical Pharmacist  09/25/2022 3:51 PM

## 2022-09-25 NOTE — ED Triage Notes (Signed)
Pt states she still has a UTI. Pt was given fosfomycin on Monday. Pt endorses continued burning while urinating as well as bladder spasms.

## 2022-09-25 NOTE — ED Triage Notes (Signed)
Pt also endorses having a yeast infection and is unsure if it has resolved. Pt states she didn't know she had one from the beginning.

## 2022-09-25 NOTE — Assessment & Plan Note (Addendum)
Unclear etiology, potentially due to underlying infection. Patient reports normal PO intake. Initially considered renal ultrasound, however would like to also evaluate for any obstruction, stone.   - Renal stone CT pending - IV fluids - Avoid nephrotoxic agents - Repeat BMP in the a.m.

## 2022-09-25 NOTE — Assessment & Plan Note (Signed)
Patient has a history of stage Ia breast cancer s/p radiation.  -Continue home letrozole

## 2022-09-25 NOTE — H&P (Signed)
History and Physical    Patient: Sharon Ware PIR:[id number] DOB: May 05, 1950 DOA: 09/25/2022 DOS: the patient was seen and examined on 09/25/2022 PCP: Dorcas Carrow, DO  Patient coming from: Home  Chief Complaint:  Chief Complaint  Patient presents with   Urinary Tract Infection   HPI: Sharon Ware is a 72 y.o. female with medical history significant of hypertension, hyperlipidemia, type 2 diabetes, stage Ia breast cancer s/p radiation on letrozole, lymphedema, who presents to the ED due to UTI.  [name] states that for approximately 2 weeks now, she has been experiencing dysuria, urinary frequency and urgency that has continued to progress despite treatment with Keflex and fosfomycin.  She denies any fevers, chills, nausea, vomiting, abdominal pain.  She states that otherwise, she feels well.   She and her husband note that they have known about her low oxygen levels for approximately 1 year now since they have gotten a pulse oximeter at home.  No workup has been done to identify the underlying etiology.  ED course: On arrival to the ED, patient was normotensive at 123/71 with heart rate of 85.  She was saturating at 94% on room air.  She was afebrile at 98.4.Initial workup demonstrates WBC of 11.6, hemoglobin 16.1, bicarb 25, glucose 144, BUN 30, creatinine 1.09 with GFR 53.  Urinalysis obtained today demonstrates large leukocytes, and over 50 WBC/hpf.  Patient started on meropenem due to known ESBL Klebsiella UTI.  [facility] contacted for admission.  Review of Systems: As mentioned in the history of present illness. All other systems reviewed and are negative.  Past Medical History:  Diagnosis Date   Arthritis    Breast cancer (HCC)    Cataract    Diabetes mellitus without complication (HCC)    Hyperlipidemia    Hypertension    Lymphedema    Vitamin D deficiency    Past Surgical History:  Procedure Laterality Date   ABDOMINAL HYSTERECTOMY     x2   BREAST BIOPSY  Right 1990s   neg   BREAST BIOPSY Right 2000s   neg   BREAST BIOPSY Left 11/24/2020   u/s bx 10:30/8 cmfn-"heart" marker   BREAST EXCISIONAL BIOPSY Right    neg   BREAST LUMPECTOMY     BREAST LUMPECTOMY WITH RADIOFREQUENCY TAG IDENTIFICATION Left 12/10/2020   Procedure: BREAST LUMPECTOMY WITH RADIOFREQUENCY TAG IDENTIFICATION;  Surgeon: Campbell Lerner, MD;  Location: [facility] ORS;  Service: General;  Laterality: Left;   DILATION AND CURETTAGE OF UTERUS     EYE SURGERY  2004   cataract surgery and laser surgery   Salpingo-oophorectomy     Social History:  reports that she has never smoked. She has never used smokeless tobacco. She reports that she does not currently use alcohol. She reports that she does not use drugs.  Allergies  Allergen Reactions   Other Anaphylaxis    Honey Dew Melon   Wound Dressing Adhesive Rash   Ciprofloxacin     Back spasms   Morphine And Codeine Other (See Comments)    Hypotension   Latex Rash    Family History  Problem Relation Age of Onset   Breast cancer Mother 5   Heart disease Father    Cancer Paternal Grandfather    Heart disease Paternal Grandfather     Prior to Admission medications   Medication Sig Start Date End Date Taking? Authorizing Provider  acetaminophen (TYLENOL) 650 MG CR tablet Take 650 mg by mouth every 8 (eight) hours as needed for  pain.    [provider]  aspirin 81 MG tablet Take 81 mg by mouth daily.    [provider]  Calcium Citrate-Vitamin D (CALCIUM CITRATE + D3 PO) Take 1,200 mg by mouth daily.    [provider]  Cholecalciferol (VITAMIN D3) 125 MCG (5000 UT) TABS Take 5,000 Units by mouth daily.    [provider]  dapagliflozin propanediol (FARXIGA) 10 MG TABS tablet Take 1 tablet (10 mg total) by mouth daily before breakfast. 07/13/22   Laural Benes, Megan P, DO  fluconazole (DIFLUCAN) 150 MG tablet Take 1 tablet (150 mg total) by mouth every 3 (three) days for 3 doses. 09/18/22  09/25/22  Becky Augusta, NP  Glucosamine HCl 1500 MG TABS Take 1,500 mg by mouth in the morning and at bedtime.    [provider]  letrozole (FEMARA) 2.5 MG tablet Take 1 tablet (2.5 mg total) by mouth daily. 04/12/22   Creig Hines, MD  losartan-hydrochlorothiazide (HYZAAR) 50-12.5 MG tablet Take 1 tablet by mouth daily. 04/13/22   Johnson, Megan P, DO  lovastatin (MEVACOR) 20 MG tablet TAKE 1 TABLET BY MOUTH EVERYDAY AT BEDTIME 04/13/22   Johnson, Megan P, DO  Menthol, Topical Analgesic, (ICY HOT EX) Apply 1 application topically daily as needed (pain).    [provider]  metFORMIN (GLUCOPHAGE-XR) 500 MG 24 hr tablet Take 2 tablets (1,000 mg total) by mouth 2 (two) times daily with a meal. 04/13/22   Johnson, Megan P, DO  Omega-3 Fatty Acids (FISH OIL ULTRA) 1400 MG CAPS Take 1,400 mg by mouth daily.    [provider]  phenazopyridine (PYRIDIUM) 200 MG tablet Take 1 tablet (200 mg total) by mouth 3 (three) times daily. 09/18/22   Becky Augusta, NP  spironolactone (ALDACTONE) 50 MG tablet Take 1 tablet (50 mg total) by mouth daily. 04/13/22   Dorcas Carrow, DO    Physical Exam: Vitals:   09/25/22 1248 09/25/22 1250  BP:  123/71  Pulse:  85  Resp:  18  Temp:  98.4 F (36.9 C)  TempSrc:  Oral  SpO2:  94%  Weight: (!) 160.6 kg   Height: 5\' 3"  (1.6 m)    Physical Exam Vitals and nursing note reviewed.  Constitutional:      General: She is not in acute distress.    Appearance: She is obese. She is not toxic-appearing.  HENT:     Head: Normocephalic and atraumatic.     Mouth/Throat:     Mouth: Mucous membranes are moist.  Cardiovascular:     Rate and Rhythm: Normal rate and regular rhythm.  Pulmonary:     Effort: Pulmonary effort is normal. No respiratory distress.     Breath sounds: Normal breath sounds. No wheezing, rhonchi or rales.  Abdominal:     General: Bowel sounds are normal. There is no distension.     Tenderness: There is no abdominal tenderness.  There is no right CVA tenderness or left CVA tenderness.  Musculoskeletal:     Comments: Bilateral lymphedema noted  Skin:    General: Skin is warm and dry.     Comments: Some erythema above the ankle of the right lower extremity, nontender  Neurological:     General: No focal deficit present.     Mental Status: She is alert and oriented to person, place, and time. Mental status is at baseline.  Psychiatric:        Mood and Affect: Mood normal.  Behavior: Behavior normal.    Data Reviewed: CBC with WBC of 11.6, hemoglobin 16.1, MCV of 93, platelets of 222 CMP with sodium of 139, potassium 4.4, bicarb 25, glucose 144, BUN 30, creatinine 1.09, calcium 10.0, AST 31, ALT 28, GFR 53 Urinalysis with glucosuria, leukocytes, proteinuria, no bacteria but over 50 WBC/hpf.  Urine culture obtained on 6/15 with Klebsiella with notable multidrug resistance, sensitive to meropenem  There are no new results to review at this time.  Assessment and Plan:  * Urinary tract infection Patient is presenting with approximately ~14 day history of dysuria, urgency and frequency with previous urine culture growing multidrug-resistant Klebsiella.  Patient has not had any improvement after dose of fosfomycin.  - Meropenem per pharmacy dosing - Tylenol as needed for fever  AKI (acute kidney injury) (HCC) Unclear etiology, potentially due to underlying infection. Patient reports normal PO intake. Initially considered renal ultrasound, however would like to also evaluate for any obstruction, stone.   - Renal stone CT pending - IV fluids - Avoid nephrotoxic agents - Repeat BMP in the a.m.  Hypoxia Patient states she has noted that her oxygen levels are usually around 88 to 90% for approximately 1 year now.  She denies any shortness of breath or known sleep apnea.  She denies any smoking history, known COPD or asthma.  Will obtain a CT of the chest to rule out interstitial lung disease; if normal,  primary differential includes obesity hypoventilation syndrome.   - Continuous pulse oximetry - Discussed with patient need for oxygen should her oxygen levels go below 88% - CT of the chest pending  Controlled diabetes mellitus type 2 with complications (HCC) - Hold home antiglycemic agents - SSI, moderate  Hypertension associated with diabetes (HCC) - Hold home losartan-HCTZ and spironolactone in the setting of AKI  Malignant neoplasm of upper-inner quadrant of left breast in female, estrogen receptor positive (HCC) Patient has a history of stage Ia breast cancer s/p radiation.  -Continue home letrozole  Lymphedema Stable at this time.  Advance Care Planning:   Code Status: Full Code   Consults: None  Family Communication: Patient's husband updated at bedside  Severity of Illness: The appropriate patient status for this patient is INPATIENT. Inpatient status is judged to be reasonable and necessary in order to provide the required intensity of service to ensure the patient's safety. The patient's presenting symptoms, physical exam findings, and initial radiographic and laboratory data in the context of their chronic comorbidities is felt to place them at high risk for further clinical deterioration. Furthermore, it is not anticipated that the patient will be medically stable for discharge from the hospital within 2 midnights of admission.   * I certify that at the point of admission it is my clinical judgment that the patient will require inpatient hospital care spanning beyond 2 midnights from the point of admission due to high intensity of service, high risk for further deterioration and high frequency of surveillance required.*  Author: Verdene Lennert, MD 09/25/2022 3:53 PM  For on call review [email or web address].

## 2022-09-25 NOTE — Assessment & Plan Note (Addendum)
Stable at this time 

## 2022-09-25 NOTE — Consult Note (Signed)
Pharmacy Antibiotic Note  PATRCIA Ware is a 72 y.o. female admitted on 09/25/2022 with UTI. PMH significant for T2DM, HTN, obesity. She presented to the ED with ongoing dysuria and urinary frequency despite adherence to course of Keflex. Patient's urine culture from Urgent care visit on 6/15 is growing MDR Klebsiella (S- gentamicin, imipenem, Zosyn). Pharmacy has been consulted for meropenem dosing.  Plan: Day 1 of antibiotics Start meropenem 1 g IV Q8H Continue to monitor renal function and follow culture results  Height: 5\' 3"  (160 cm) Weight: (!) 160.6 kg (354 lb) IBW/kg (Calculated) : 52.4  Temp (24hrs), Avg:98.4 F (36.9 C), Min:98.4 F (36.9 C), Max:98.4 F (36.9 C)  Recent Labs  Lab 09/25/22 1428  WBC 11.6*  CREATININE 1.09*    Estimated Creatinine Clearance: 68.4 mL/min (A) (by C-G formula based on SCr of 1.09 mg/dL (H)).    Allergies  Allergen Reactions   Other Anaphylaxis    Honey Dew Melon   Wound Dressing Adhesive Rash   Ciprofloxacin     Back spasms   Morphine And Codeine Other (See Comments)    Hypotension   Latex Rash    Antimicrobials this admission: 6/22 Meropenem >>   Dose adjustments this admission: N/A  Microbiology results: 6/15 UCx: Klebsiella pneumoniae (S- gentamicin, imipenem, Zosyn)  Thank you for allowing pharmacy to be a part of this patient's care.  Celene Squibb, PharmD Clinical Pharmacist 09/25/2022 4:01 PM

## 2022-09-25 NOTE — ED Notes (Signed)
Pt complains of urethral pain and dysuria x 2 weeks. States she was diagnosed with UTI about 2 weeks ago and was prescribed abx, but she is not getting any better. States she is going to the bathroom more often and often times cannot hold her urine, which is unusual for her.

## 2022-09-25 NOTE — ED Provider Notes (Signed)
[facility] Provider Note    Event Date/Time   First MD Initiated Contact with Patient 09/25/22 1306     (approximate)   History   Urinary Tract Infection   HPI  Sharon Ware is a 72 y.o. female with a history of lymphedema recent diagnosis of UTI previously on a course of Keflex found to have MDR Klebsiella given a dose of fosfomycin few days ago but having persistent dysuria urinary frequency as well.  No fevers or chills.  Was told to come to the ER for IV antibiotics if symptoms persisted.     Physical Exam   Triage Vital Signs: ED Triage Vitals  Enc Vitals Group     BP 09/25/22 1250 123/71     Pulse Rate 09/25/22 1250 85     Resp 09/25/22 1250 18     Temp 09/25/22 1250 98.4 F (36.9 C)     Temp Source 09/25/22 1250 Oral     SpO2 09/25/22 1250 94 %     Weight 09/25/22 1248 (!) 354 lb (160.6 kg)     Height 09/25/22 1248 5\' 3"  (1.6 m)     Head Circumference --      Peak Flow --      Pain Score --      Pain Loc --      Pain Edu? --      Excl. in GC? --     Most recent vital signs: Vitals:   09/25/22 1250  BP: 123/71  Pulse: 85  Resp: 18  Temp: 98.4 F (36.9 C)  SpO2: 94%     Constitutional: Alert  Eyes: Conjunctivae are normal.  Head: Atraumatic. Nose: No congestion/rhinnorhea. Mouth/Throat: Mucous membranes are moist.   Neck: Painless ROM.  Cardiovascular:   Good peripheral circulation. Respiratory: Normal respiratory effort.  No retractions.  Gastrointestinal: Soft and nontender.  Musculoskeletal:  no deformity Neurologic:  MAE spontaneously. No gross focal neurologic deficits are appreciated.  Skin:  Skin is warm, dry and intact. No rash noted. Psychiatric: Mood and affect are normal. Speech and behavior are normal.    ED Results / Procedures / Treatments   Labs (all labs ordered are listed, but only abnormal results are displayed) Labs Reviewed  URINALYSIS, ROUTINE W REFLEX MICROSCOPIC - Abnormal; Notable  for the following components:      Result Value   Color, Urine YELLOW (*)    APPearance CLOUDY (*)    Glucose, UA >=500 (*)    Protein, ur 30 (*)    Leukocytes,Ua LARGE (*)    All other components within normal limits  CBC WITH DIFFERENTIAL/PLATELET - Abnormal; Notable for the following components:   WBC 11.6 (*)    RBC 5.38 (*)    Hemoglobin 16.1 (*)    HCT 50.4 (*)    All other components within normal limits  COMPREHENSIVE METABOLIC PANEL - Abnormal; Notable for the following components:   Glucose, Bld 144 (*)    BUN 30 (*)    Creatinine, Ser 1.09 (*)    GFR, Estimated 53 (*)    All other components within normal limits     EKG     RADIOLOGY    PROCEDURES:  Critical Care performed:   Procedures   MEDICATIONS ORDERED IN ED: Medications  meropenem (MERREM) 1 g in sodium chloride 0.9 % 100 mL IVPB (has no administration in time range)     IMPRESSION / MDM / ASSESSMENT AND PLAN / ED COURSE  I  reviewed the triage vital signs and the nursing notes.                              Differential diagnosis includes, but is not limited to, UTI, cystitis, pyelonephritis, bladder spasms  Patient presenting to the ER for evaluation of symptoms as described above.  Based on symptoms, risk factors and considered above differential, this presenting complaint could reflect a potentially life-threatening illness therefore the patient will be placed on continuous pulse oximetry and telemetry for monitoring.  Laboratory evaluation will be sent to evaluate for the above complaints.  Review of her chart does have evidence of multidrug-resistant Klebsiella that is susceptible to Merrem.  She is not septic currently will order Merrem.  Will consult hospitalist for admission.       FINAL CLINICAL IMPRESSION(S) / ED DIAGNOSES   Final diagnoses:  Acute cystitis without hematuria     Rx / DC Orders   ED Discharge Orders     None        Note:  This document was prepared  using Dragon voice recognition software and may include unintentional dictation errors.    Willy Eddy, MD 09/25/22 [phone]

## 2022-09-25 NOTE — Assessment & Plan Note (Signed)
-   Hold home losartan-HCTZ and spironolactone in the setting of AKI

## 2022-09-25 NOTE — Assessment & Plan Note (Signed)
Patient states she has noted that her oxygen levels are usually around 88 to 90% for approximately 1 year now.  She denies any shortness of breath or known sleep apnea.  She denies any smoking history, known COPD or asthma.  Will obtain a CT of the chest to rule out interstitial lung disease; if normal, primary differential includes obesity hypoventilation syndrome.   - Continuous pulse oximetry - Discussed with patient need for oxygen should her oxygen levels go below 88% - CT of the chest pending

## 2022-09-25 NOTE — Assessment & Plan Note (Signed)
-   Hold home antiglycemic agents - SSI, moderate 

## 2022-09-25 NOTE — Progress Notes (Signed)
PHARMACY -  BRIEF ANTIBIOTIC NOTE   Pharmacy has received consult(s) for meropenem from an ED provider.  The patient's profile has been reviewed for ht/wt/allergies/indication/available labs.    One time order(s) placed for meropenem 1 gram IV x 1  Further antibiotics/pharmacy consults should be ordered by admitting physician if indicated.                       Thank you, Lowella Bandy 09/25/2022  2:18 PM

## 2022-09-26 DIAGNOSIS — N10 Acute tubulo-interstitial nephritis: Secondary | ICD-10-CM | POA: Diagnosis not present

## 2022-09-26 LAB — CBC
HCT: 44.3 % (ref 36.0–46.0)
Hemoglobin: 14.1 g/dL (ref 12.0–15.0)
MCH: 30.1 pg (ref 26.0–34.0)
MCHC: 31.8 g/dL (ref 30.0–36.0)
MCV: 94.5 fL (ref 80.0–100.0)
Platelets: 189 10*3/uL (ref 150–400)
RBC: 4.69 MIL/uL (ref 3.87–5.11)
RDW: 14.2 % (ref 11.5–15.5)
WBC: 7.5 10*3/uL (ref 4.0–10.5)
nRBC: 0 % (ref 0.0–0.2)

## 2022-09-26 LAB — GLUCOSE, CAPILLARY
Glucose-Capillary: 140 mg/dL — ABNORMAL HIGH (ref 70–99)
Glucose-Capillary: 156 mg/dL — ABNORMAL HIGH (ref 70–99)
Glucose-Capillary: 146 mg/dL — ABNORMAL HIGH (ref 70–99)
Glucose-Capillary: 161 mg/dL — ABNORMAL HIGH (ref 70–99)

## 2022-09-26 LAB — BASIC METABOLIC PANEL
Anion gap: 10 (ref 5–15)
BUN: 26 mg/dL — ABNORMAL HIGH (ref 8–23)
CO2: 27 mmol/L (ref 22–32)
Calcium: 9.2 mg/dL (ref 8.9–10.3)
Chloride: 102 mmol/L (ref 98–111)
Creatinine, Ser: 0.71 mg/dL (ref 0.44–1.00)
GFR, Estimated: >60 mL/min (ref >60)
Glucose, Bld: 151 mg/dL — ABNORMAL HIGH (ref 70–99)
Potassium: 3.9 mmol/L (ref 3.5–5.1)
Sodium: 139 mmol/L (ref 135–145)

## 2022-09-26 NOTE — Progress Notes (Addendum)
Progress Note   Patient: Sharon Ware GNF:[id number] DOB: 04/15/1950 DOA: 09/25/2022     1 DOS: the patient was seen and examined on 09/26/2022     Subjective:  Patient seen and examined at bedside this morning Denies nausea vomiting abdominal pain or chest pain Discussed the findings of patient's abdominal and chest CT scan results with her  Brief hospital course: From HPI "Sharon Ware is a 72 y.o. female with medical history significant of hypertension, hyperlipidemia, type 2 diabetes, stage Ia breast cancer s/p radiation on letrozole, lymphedema, who presents to the ED due to UTI.   Sharon Ware states that for approximately 2 weeks now, she has been experiencing dysuria, urinary frequency and urgency that has continued to progress despite treatment with Keflex and fosfomycin.  She and her husband note that they have known about her low oxygen levels for approximately 1 year now since they have gotten a pulse oximeter at home.  No workup has been done to identify the underlying etiology.   ED course: On arrival to the ED, patient was normotensive at 123/71 with heart rate of 85.  She was saturating at 94% on room air.  She was afebrile at 98.4.Initial workup demonstrates WBC of 11.6, hemoglobin 16.1, bicarb 25, glucose 144, BUN 30, creatinine 1.09 with GFR 53.  Urinalysis obtained today demonstrates large leukocytes, and over 50 WBC/hpf.  Patient started on meropenem due to known ESBL Klebsiella UTI.  TRH contacted for admission."  Assessment and Plan: Urinary tract infection with right-sided acute pyelonephritis Patient is presenting with approximately ~14 day history of dysuria, urgency and frequency with previous urine culture growing multidrug-resistant Klebsiella.  Patient has not had any improvement after dose of fosfomycin. CT scan of the abdomen shows some stranding around the right kidney  suggestive of possible pyelonephritis - Continue meropenem per pharmacy dosing -  Continue Tylenol as needed for fever   AKI (acute kidney injury) (HCC) Unclear etiology, potentially due to underlying infection. Patient reports normal PO intake. Initially considered renal ultrasound, however would like to also evaluate for any obstruction, stone.    - Renal stone CT pending - IV fluids - Avoid nephrotoxic agents - Repeat BMP in the a.m.   Thoracic aortic aneurysm Thoracic aortic aneurysm of 4.2 cm According to recommendation by radiologist to have repeat imaging in 1 year  Hypoxia likely secondary to obesity hypoventilation syndrome Patient states she has noted that her oxygen levels are usually around 88 to 90% for approximately 1 year now.  She denies any shortness of breath or known sleep apnea.  She denies any smoking history, known COPD or asthma.   CT scan of the chest personally reviewed by me did not show any infiltrates or effusion. CT scan of the chest showed 4.2 cm ascending thoracic aortic aneurysm Monitor oxygen saturation   Controlled diabetes mellitus type 2 with complications (HCC) - Hold home antiglycemic agents - SSI, moderate   Hypertension associated with diabetes (HCC) - Continue to hold home losartan-HCTZ and spironolactone in the setting of AKI   Malignant neoplasm of upper-inner quadrant of left breast in female, estrogen receptor positive (HCC) Patient has a history of stage Ia breast cancer s/p radiation. Continue home letrozole   Lymphedema Stable at this time.   Advance Care Planning:   Code Status: Full Code    Consults: None   Family Communication: Patient's husband updated at bedside  Physical Exam:  Constitutional:      Appearance: Morbid obesity HENT:  Head: Normocephalic and atraumatic.     Mouth/Throat:     Mouth: Mucous membranes are moist.  Cardiovascular:     Rate and Rhythm: Normal rate and regular rhythm.  Pulmonary:     Effort: Pulmonary effort is normal. No respiratory distress.     Breath sounds:  Normal breath sounds. No wheezing, rhonchi or rales.  Abdominal:     General: Bowel sounds are normal. There is no distension.  Musculoskeletal:     Comments: Bilateral lymphedema noted  Skin:    General: Skin is warm and dry.     Comments: Some erythema above the ankle of the right lower extremity, nontender  Neurological:     General: No focal deficit present.  Psychiatric:        Mood and Affect: Mood normal.      Vitals:   09/25/22 1717 09/25/22 2043 09/26/22 0443 09/26/22 0817  BP: (!) 120/59 134/72 115/60 (!) 142/63  Pulse: 89 78 73 78  Resp: 18 16 12 16   Temp: 98.1 F (36.7 C) 98.1 F (36.7 C) (!) 97.4 F (36.3 C)   TempSrc:   Oral   SpO2: 90% 93% (!) 89% (!) 88%  Weight:      Height:        Data Reviewed: Reviewed patient's labs as well as CT scan findings as shown above  Author: Loyce Dys, MD 09/26/2022 1:49 PM  For on call review [email or web address].

## 2022-09-27 ENCOUNTER — Other Ambulatory Visit: Payer: Self-pay | Admitting: Infectious Diseases

## 2022-09-27 DIAGNOSIS — Z1612 Extended spectrum beta lactamase (ESBL) resistance: Secondary | ICD-10-CM | POA: Diagnosis not present

## 2022-09-27 DIAGNOSIS — I890 Lymphedema, not elsewhere classified: Secondary | ICD-10-CM

## 2022-09-27 DIAGNOSIS — N390 Urinary tract infection, site not specified: Secondary | ICD-10-CM | POA: Diagnosis not present

## 2022-09-27 DIAGNOSIS — B9689 Other specified bacterial agents as the cause of diseases classified elsewhere: Secondary | ICD-10-CM | POA: Diagnosis not present

## 2022-09-27 DIAGNOSIS — N10 Acute tubulo-interstitial nephritis: Secondary | ICD-10-CM | POA: Diagnosis not present

## 2022-09-27 LAB — BASIC METABOLIC PANEL
Anion gap: 8 (ref 5–15)
BUN: 20 mg/dL (ref 8–23)
CO2: 28 mmol/L (ref 22–32)
Calcium: 9.2 mg/dL (ref 8.9–10.3)
Chloride: 105 mmol/L (ref 98–111)
Creatinine, Ser: 0.76 mg/dL (ref 0.44–1.00)
GFR, Estimated: >60 mL/min (ref >60)
Glucose, Bld: 161 mg/dL — ABNORMAL HIGH (ref 70–99)
Potassium: 4.3 mmol/L (ref 3.5–5.1)
Sodium: 141 mmol/L (ref 135–145)

## 2022-09-27 LAB — CBC WITH DIFFERENTIAL/PLATELET
Abs Immature Granulocytes: 0.05 10*3/uL (ref 0.00–0.07)
Basophils Absolute: 0.1 10*3/uL (ref 0.0–0.1)
Basophils Relative: 1 %
Eosinophils Absolute: 0.3 10*3/uL (ref 0.0–0.5)
Eosinophils Relative: 4 %
HCT: 45.7 % (ref 36.0–46.0)
Hemoglobin: 14.5 g/dL (ref 12.0–15.0)
Immature Granulocytes: 1 %
Lymphocytes Relative: 28 %
Lymphs Abs: 2.1 10*3/uL (ref 0.7–4.0)
MCH: 30.1 pg (ref 26.0–34.0)
MCHC: 31.7 g/dL (ref 30.0–36.0)
MCV: 94.8 fL (ref 80.0–100.0)
Monocytes Absolute: 0.6 10*3/uL (ref 0.1–1.0)
Monocytes Relative: 7 %
Neutro Abs: 4.4 10*3/uL (ref 1.7–7.7)
Neutrophils Relative %: 59 %
Platelets: 211 10*3/uL (ref 150–400)
RBC: 4.82 MIL/uL (ref 3.87–5.11)
RDW: 14.1 % (ref 11.5–15.5)
WBC: 7.4 10*3/uL (ref 4.0–10.5)
nRBC: 0 % (ref 0.0–0.2)

## 2022-09-27 LAB — GLUCOSE, CAPILLARY
Glucose-Capillary: 156 mg/dL — ABNORMAL HIGH (ref 70–99)
Glucose-Capillary: 187 mg/dL — ABNORMAL HIGH (ref 70–99)
Glucose-Capillary: 160 mg/dL — ABNORMAL HIGH (ref 70–99)
Glucose-Capillary: 227 mg/dL — ABNORMAL HIGH (ref 70–99)

## 2022-09-27 MED ORDER — SODIUM CHLORIDE 0.9 % IV SOLN
1.0000 g | INTRAVENOUS | Status: DC
  Administered 2022-09-28 – 2022-09-29 (×2): 1000 mg via INTRAVENOUS
  Filled 2022-09-27 (×2): qty 1

## 2022-09-27 MED ORDER — LOSARTAN POTASSIUM 50 MG PO TABS
50.0000 mg | ORAL_TABLET | Freq: Every day | ORAL | Status: DC
  Administered 2022-09-27 – 2022-09-29 (×3): 50 mg via ORAL
  Filled 2022-09-27 (×3): qty 1

## 2022-09-27 MED ORDER — HYDROCHLOROTHIAZIDE 12.5 MG PO TABS
12.5000 mg | ORAL_TABLET | Freq: Every day | ORAL | Status: DC
  Administered 2022-09-27 – 2022-09-29 (×3): 12.5 mg via ORAL
  Filled 2022-09-27 (×3): qty 1

## 2022-09-27 MED ORDER — LOSARTAN POTASSIUM-HCTZ 50-12.5 MG PO TABS: 1.0000 | ORAL_TABLET | Freq: Every day | ORAL | Status: DC

## 2022-09-27 NOTE — Progress Notes (Signed)
Progress Note   Patient: Sharon Ware ZOX:[id number] DOB: 1950-06-19 DOA: 09/25/2022     2 DOS: the patient was seen and examined on 09/27/2022     Subjective:  Patient seen and examined at bedside this morning Denies nausea vomiting abdominal pain or chest pain Discussed the findings of patient's abdominal and chest CT scan results with her   Brief hospital course: From HPI "Sharon Ware is a 72 y.o. female with medical history significant of hypertension, hyperlipidemia, type 2 diabetes, stage Ia breast cancer s/p radiation on letrozole, lymphedema, who presents to the ED due to UTI.   Sharon Ware states that for approximately 2 weeks now, she has been experiencing dysuria, urinary frequency and urgency that has continued to progress despite treatment with Keflex and fosfomycin.  She and her husband note that they have known about her low oxygen levels for approximately 1 year now since they have gotten a pulse oximeter at home.  No workup has been done to identify the underlying etiology.   ED course: On arrival to the ED, patient was normotensive at 123/71 with heart rate of 85.  She was saturating at 94% on room air.  She was afebrile at 98.4.Initial workup demonstrates WBC of 11.6, hemoglobin 16.1, bicarb 25, glucose 144, BUN 30, creatinine 1.09 with GFR 53.  Urinalysis obtained today demonstrates large leukocytes, and over 50 WBC/hpf.  Patient started on meropenem due to known ESBL Klebsiella UTI.  TRH contacted for admission."   Assessment and Plan: Urinary tract infection with right-sided acute pyelonephritis Patient is presenting with approximately ~14 day history of dysuria, urgency and frequency with previous urine culture growing multidrug-resistant Klebsiella.  Patient has not had any improvement after dose of fosfomycin. CT scan of the abdomen shows some stranding around the right kidney  suggestive of possible pyelonephritis - Continue meropenem per pharmacy  dosing - Continue Tylenol as needed for fever Infectious disease consulted for recommendation Case discussed I Appreciate input of Dr. Avon Gully  AKI (acute kidney injury) (HCC)-improved Unclear etiology, potentially due to underlying infection. Patient reports normal PO intake. Initially considered renal ultrasound, however would like to also evaluate for any obstruction, stone.  CT scan showed small calculi Continue IV fluid Monitor BMP   Thoracic aortic aneurysm Thoracic aortic aneurysm of 4.2 cm According to recommendation by radiologist to have repeat imaging in 1 year   Hypoxia likely secondary to obesity hypoventilation syndrome Patient states she has noted that her oxygen levels are usually around 88 to 90% for approximately 1 year now.  She denies any shortness of breath or known sleep apnea.  She denies any smoking history, known COPD or asthma.   CT scan of the chest personally reviewed by me did not show any infiltrates or effusion. CT scan of the chest showed 4.2 cm ascending thoracic aortic aneurysm Monitor oxygen saturation Outpatient follow-up   Controlled diabetes mellitus type 2 with complications (HCC) - Hold home antiglycemic agents Continue sliding scale   Hypertension associated with diabetes (HCC) - Resumed losartan and hydrochlorothiazide  Holding spironolactone given relative low blood pressure    Malignant neoplasm of upper-inner quadrant of left breast in female, estrogen receptor positive (HCC) Patient has a history of stage Ia breast cancer s/p radiation. Continue home letrozole   Lymphedema Stable at this time.   Advance Care Planning:   Code Status: Full Code    Consults: None   Family Communication: Patient's husband updated at bedside   Physical Exam:   Constitutional:  Appearance: Morbid obesity HENT:     Head: Normocephalic and atraumatic.     Mouth/Throat:     Mouth: Mucous membranes are moist.  Cardiovascular:     Rate  and Rhythm: Normal rate and regular rhythm.  Pulmonary:     Effort: Pulmonary effort is normal. No respiratory distress.     Breath sounds: Normal breath sounds. No wheezing, rhonchi or rales.  Abdominal:     General: Bowel sounds are normal. There is no distension.  Musculoskeletal:     Comments: Bilateral lymphedema noted  Skin:    General: Skin is warm and dry.     Comments: Some erythema above the ankle of the right lower extremity, nontender  Neurological:     General: No focal deficit present.  Psychiatric:        Mood and Affect: Mood normal.      Data Reviewed: Have reviewed patient's lab showing sodium 141 potassium 4.3 creatinine 0.7    Vitals:   09/26/22 1547 09/26/22 1959 09/27/22 0434 09/27/22 0728  BP: 127/70 (!) 150/75 128/69 138/82  Pulse: 76 81 74 78  Resp: 16 20 20 16   Temp: 98.1 F (36.7 C) 99 F (37.2 C) 97.7 F (36.5 C) 98 F (36.7 C)  TempSrc:      SpO2: (!) 88% (!) 85% 92% 92%  Weight:      Height:         Author: Loyce Dys, MD 09/27/2022 3:22 PM  For on call review [email or web address].

## 2022-09-27 NOTE — TOC Progression Note (Signed)
Transition of Care [facility]) - Progression Note    Patient Details  Name: Sharon Ware MRN: [id number] Date of Birth: 12-Dec-1950  Transition of Care [facility]) CM/SW Contact  Allena Katz, LCSW Phone Number: 09/27/2022, 12:49 PM  Clinical Narrative:            Expected Discharge Plan and Services                                               Social Determinants of Health (SDOH) Interventions SDOH Screenings   Food Insecurity: No Food Insecurity (09/25/2022)  Housing: Low Risk  (09/25/2022)  Transportation Needs: No Transportation Needs (09/25/2022)  Utilities: Not At Risk (09/25/2022)  Alcohol Screen: Low Risk  (10/26/2021)  Depression (PHQ2-9): Low Risk  (08/16/2022)  Financial Resource Strain: Low Risk  (10/26/2021)  Physical Activity: Inactive (10/26/2021)  Social Connections: Moderately Isolated (10/26/2021)  Stress: No Stress Concern Present (10/26/2021)  Tobacco Use: Low Risk  (09/25/2022)    Readmission Risk Interventions     No data to display

## 2022-09-27 NOTE — Consult Note (Signed)
Pharmacy Antibiotic Note  Sharon Ware is a 72 y.o. female admitted on 09/25/2022 with UTI. PMH significant for T2DM, HTN, obesity. She presented to the ED with ongoing dysuria and urinary frequency despite adherence to course of Keflex. Patient's urine culture from Urgent care visit on 6/15 grew MDR Klebsiella. Pharmacy has been consulted for meropenem dosing.  Day 3 of antibiotics.   Plan: Continue meropenem 1 gram IV every 8 hours Continue to monitor renal function and dose adjustments as warranted  Height: 5\' 3"  (160 cm) Weight: (!) 160.6 kg (354 lb) IBW/kg (Calculated) : 52.4  Temp (24hrs), Avg:98.2 F (36.8 C), Min:97.7 F (36.5 C), Max:99 F (37.2 C)  Recent Labs  Lab 09/25/22 1428 09/26/22 0508 09/27/22 0514  WBC 11.6* 7.5 7.4  CREATININE 1.09* 0.71 0.76     Estimated Creatinine Clearance: 93.2 mL/min (by C-G formula based on SCr of 0.76 mg/dL).    Allergies  Allergen Reactions   Other Anaphylaxis    Honey Dew Melon   Wound Dressing Adhesive Rash   Ciprofloxacin     Back spasms   Morphine And Codeine Other (See Comments)    Hypotension   Latex Rash    Antimicrobials this admission: 6/22 Meropenem >>   Dose adjustments this admission: N/A  Microbiology results: 6/15 UCx: Klebsiella pneumoniae (S- gentamicin, imipenem, Zosyn)  Thank you for allowing pharmacy to be a part of this patient's care.   Elliot Gurney, PharmD, BCPS Clinical Pharmacist  09/27/2022 10:14 AM

## 2022-09-27 NOTE — Consult Note (Signed)
NAME: Sharon Ware  DOB: [birth year]  MRN: [id number]  Date/Time: 09/27/2022 3:26 PM  REQUESTING PROVIDER:Dr.Djan Subjective:  REASON FOR CONSULT: ESBL UTI ? Sharon Ware is a 72 y.o. with a history of  HTN, HLD, DM, Lymphedema, ca breast S/p surgery/radiation and now on letrazole, HTN, DM, HLD has had recurrent red patch on the left lower extremity for which she has been treated wth multiple course of antibiotics presents with 2 week h/o dysuria, frequency and urgency, was given fosfomycin and keflex as Op with not much improvement on 09/25/22. No fever or chills  09/25/22  BP 134/72  Temp 98.1 F (36.7 C)  Pulse Rate 78  Resp 16  SpO2 93 %  Weight 354 lb (H)    Latest Reference Range & Units 09/25/22  WBC 4.0 - 10.5 K/uL 11.6 (H)  Hemoglobin 12.0 - 15.0 g/dL 34.7 (H)  HCT 42.5 - 95.6 % 50.4 (H)  Platelets 150 - 400 K/uL 222  Creatinine 0.44 - 1.00 mg/dL 3.87 (H)  Pt had ESBl klebsiella in culture from 6/15 and started on Meropenem Ct abdomen revealed b/l nonobstructive renal calculi  Past Medical History:  Diagnosis Date   Arthritis    Breast cancer (HCC)    Cataract    Diabetes mellitus without complication (HCC)    Hyperlipidemia    Hypertension    Lymphedema    Vitamin D deficiency     Past Surgical History:  Procedure Laterality Date   ABDOMINAL HYSTERECTOMY     x2   BREAST BIOPSY Right 1990s   neg   BREAST BIOPSY Right 2000s   neg   BREAST BIOPSY Left 11/24/2020   u/s bx 10:30/8 cmfn-"heart" marker   BREAST EXCISIONAL BIOPSY Right    neg   BREAST LUMPECTOMY     BREAST LUMPECTOMY WITH RADIOFREQUENCY TAG IDENTIFICATION Left 12/10/2020   Procedure: BREAST LUMPECTOMY WITH RADIOFREQUENCY TAG IDENTIFICATION;  Surgeon: Campbell Lerner, MD;  Location: [facility] ORS;  Service: General;  Laterality: Left;   DILATION AND CURETTAGE OF UTERUS     EYE SURGERY  2004   cataract surgery and laser surgery   Salpingo-oophorectomy      Social History   Socioeconomic  History   Marital status: Married    Spouse name: Not on file   Number of children: Not on file   Years of education: Not on file   Highest education level: Not on file  Occupational History   Occupation: retired  Tobacco Use   Smoking status: Never   Smokeless tobacco: Never  Vaping Use   Vaping Use: Never used  Substance and Sexual Activity   Alcohol use: Not Currently   Drug use: Never   Sexual activity: Not Currently  Other Topics Concern   Not on file  Social History Narrative   Not on file   Social Determinants of Health   Financial Resource Strain: Low Risk  (10/26/2021)   Overall Financial Resource Strain (CARDIA)    Difficulty of Paying Living Expenses: Not hard at all  Food Insecurity: No Food Insecurity (09/25/2022)   Hunger Vital Sign    Worried About Running Out of Food in the Last Year: Never true    Ran Out of Food in the Last Year: Never true  Transportation Needs: No Transportation Needs (09/25/2022)   PRAPARE - Transportation    Lack of Transportation (Medical): No    Lack of Transportation (Non-Medical): No  Physical Activity: Inactive (10/26/2021)   Exercise Vital Sign  Days of Exercise per Week: 0 days    Minutes of Exercise per Session: 0 min  Stress: No Stress Concern Present (10/26/2021)   [employer] of Occupational Health - Occupational Stress Questionnaire    Feeling of Stress : Not at all  Social Connections: Moderately Isolated (10/26/2021)   Social Connection and Isolation Panel [NHANES]    Frequency of Communication with Friends and Family: More than three times a week    Frequency of Social Gatherings with Friends and Family: More than three times a week    Attends Religious Services: Never    Database administrator or Organizations: No    Attends Banker Meetings: Never    Marital Status: Married  Catering manager Violence: Not At Risk (09/25/2022)   Humiliation, Afraid, Rape, and Kick questionnaire    Fear of Current  or Ex-Partner: No    Emotionally Abused: No    Physically Abused: No    Sexually Abused: No    Family History  Problem Relation Age of Onset   Breast cancer Mother 56   Heart disease Father    Cancer Paternal Grandfather    Heart disease Paternal Grandfather    Allergies  Allergen Reactions   Other Anaphylaxis    Honey Dew Melon   Wound Dressing Adhesive Rash   Ciprofloxacin     Back spasms   Morphine And Codeine Other (See Comments)    Hypotension   Latex Rash   I? Current Facility-Administered Medications  Medication Dose Route Frequency Provider Last Rate Last Admin   acetaminophen (TYLENOL) tablet 650 mg  650 mg Oral Q6H PRN Verdene Lennert, MD       Or   acetaminophen (TYLENOL) suppository 650 mg  650 mg Rectal Q6H PRN Verdene Lennert, MD       aspirin EC tablet 81 mg  81 mg Oral Daily Verdene Lennert, MD   81 mg at 09/27/22 1009   enoxaparin (LOVENOX) injection 80 mg  0.5 mg/kg Subcutaneous Q24H Foye Deer, RPH   80 mg at 09/26/22 2202   losartan (COZAAR) tablet 50 mg  50 mg Oral Daily Drusilla Kanner, RPH   50 mg at 09/27/22 1147   And   hydrochlorothiazide (HYDRODIURIL) tablet 12.5 mg  12.5 mg Oral Daily Drusilla Kanner, RPH   12.5 mg at 09/27/22 1147   insulin aspart (novoLOG) injection 0-15 Units  0-15 Units Subcutaneous TID WC Verdene Lennert, MD   3 Units at 09/27/22 1147   letrozole [facility]) tablet 2.5 mg  2.5 mg Oral Daily Verdene Lennert, MD   2.5 mg at 09/27/22 1009   meropenem (MERREM) 1 g in sodium chloride 0.9 % 100 mL IVPB  1 g Intravenous Q8H Coulter, Carolyn, RPH 200 mL/hr at 09/27/22 1320 1 g at 09/27/22 1320   ondansetron (ZOFRAN) tablet 4 mg  4 mg Oral Q6H PRN Verdene Lennert, MD       Or   ondansetron (ZOFRAN) injection 4 mg  4 mg Intravenous Q6H PRN Verdene Lennert, MD       polyethylene glycol (MIRALAX / GLYCOLAX) packet 17 g  17 g Oral Daily PRN Verdene Lennert, MD       pravastatin (PRAVACHOL) tablet 20 mg  20 mg Oral q1800 Barrie Folk, RPH   20 mg at 09/26/22 1728   sodium chloride flush (NS) 0.9 % injection 3 mL  3 mL Intravenous Q12H Verdene Lennert, MD   3 mL at 09/27/22 1010  Abtx:  Anti-infectives (From admission, onward)    Start     Dose/Rate Route Frequency Ordered Stop   09/25/22 2200  meropenem (MERREM) 1 g in sodium chloride 0.9 % 100 mL IVPB        1 g 200 mL/hr over 30 Minutes Intravenous Every 8 hours 09/25/22 1602     09/25/22 1430  meropenem (MERREM) 1 g in sodium chloride 0.9 % 100 mL IVPB        1 g 200 mL/hr over 30 Minutes Intravenous  Once 09/25/22 1420 09/25/22 1640       REVIEW OF SYSTEMS:  Const: negative fever, negative chills, negative weight loss Eyes: negative diplopia or visual changes, negative eye pain ENT: negative coryza, negative sore throat Resp: negative cough, hemoptysis, dyspnea Cards: negative for chest pain, palpitations, lower extremity edema GU: frequency, dysuria and hematuria GI: Negative for abdominal pain, diarrhea, bleeding, constipation Skin: negative for rash and pruritus Heme: negative for easy bruising and gum/nose bleeding MS: negative for myalgias, arthralgias, back pain and muscle weakness Neurolo:negative for headaches, dizziness, vertigo, memory problems  Psych: negative for feelings of anxiety, depression  Endocrine: negative for thyroid, diabetes Allergy/Immunology- as above Objective:  VITALS:  BP 138/82 (BP Location: Right Arm)   Pulse 78   Temp 98 F (36.7 C)   Resp 16   Ht 5\' 3"  (1.6 m)   Wt (!) 160.6 kg   SpO2 92%   BMI 62.71 kg/m   PHYSICAL EXAM:  General: Alert, cooperative, no distress, appears stated age.  Head: Normocephalic, without obvious abnormality, atraumatic. Eyes: Conjunctivae clear, anicteric sclerae. Pupils are equal ENT Nares normal. No drainage or sinus tenderness. Lips, mucosa, and tongue normal. No Thrush Neck: Supple, symmetrical, no adenopathy, thyroid: non tender no carotid bruit and no JVD. Back: No CVA  tenderness. Lungs: Clear to auscultation bilaterally. No Wheezing or Rhonchi. No rales. Heart: Regular rate and rhythm, no murmur, rub or gallop. Abdomen: Soft, non-tender,not distended. Bowel sounds normal. No masses Extremities: lymphedema/venous edema legs Has wrap left leg Skin: No rashes or lesions. Or bruising Lymph: Cervical, supraclavicular normal. Neurologic: Grossly non-focal Pertinent Labs Lab Results CBC    Component Value Date/Time   WBC 7.4 09/27/2022 0514   RBC 4.82 09/27/2022 0514   HGB 14.5 09/27/2022 0514   HGB 14.2 03/25/2022 0923   HCT 45.7 09/27/2022 0514   HCT 42.5 03/25/2022 0923   PLT 211 09/27/2022 0514   PLT 212 03/25/2022 0923   MCV 94.8 09/27/2022 0514   MCV 90 03/25/2022 0923   MCH 30.1 09/27/2022 0514   MCHC 31.7 09/27/2022 0514   RDW 14.1 09/27/2022 0514   RDW 12.2 03/25/2022 0923   LYMPHSABS 2.1 09/27/2022 0514   LYMPHSABS 2.7 03/25/2022 0923   MONOABS 0.6 09/27/2022 0514   EOSABS 0.3 09/27/2022 0514   EOSABS 0.2 03/25/2022 0923   BASOSABS 0.1 09/27/2022 0514   BASOSABS 0.0 03/25/2022 0923       Latest Ref Rng & Units 09/27/2022    5:14 AM 09/26/2022    5:08 AM 09/25/2022    2:28 PM  CMP  Glucose 70 - 99 mg/dL 409  811  914   BUN 8 - 23 mg/dL 20  26  30    Creatinine 0.44 - 1.00 mg/dL 7.82  9.56  2.13   Sodium 135 - 145 mmol/L 141  139  139   Potassium 3.5 - 5.1 mmol/L 4.3  3.9  4.4   Chloride 98 - 111 mmol/L 105  102  99   CO2 22 - 32 mmol/L 28  27  25    Calcium 8.9 - 10.3 mg/dL 9.2  9.2  82.9   Total Protein 6.5 - 8.1 g/dL   8.1   Total Bilirubin 0.3 - 1.2 mg/dL   0.7   Alkaline Phos 38 - 126 U/L   52   AST 15 - 41 U/L   31   ALT 0 - 44 U/L   28       Microbiology: Recent Results (from the past 240 hour(s))  Urine Culture     Status: Abnormal   Collection Time: 09/18/22 10:28 AM   Specimen: Urine, Random  Result Value Ref Range Status   Specimen Description   Final    URINE, RANDOM Performed at [facility]  Lab, [address] [place], [place] [address]    Special Requests   Final    NONE Reflexed from [id number] Performed at [facility], [address], [place], [place] [address]    Culture (A)  Final    >=100,000 COLONIES/mL KLEBSIELLA PNEUMONIAE Confirmed Extended Spectrum Beta-Lactamase Producer (ESBL).  In bloodstream infections from ESBL organisms, carbapenems are preferred over piperacillin/tazobactam. They are shown to have a lower risk of mortality.    Report Status 09/20/2022 FINAL  Final   Organism ID, Bacteria KLEBSIELLA PNEUMONIAE (A)  Final      Susceptibility   Klebsiella pneumoniae - MIC*    AMPICILLIN >=32 RESISTANT Resistant     CEFAZOLIN >=64 RESISTANT Resistant     CEFEPIME >=32 RESISTANT Resistant     CEFTRIAXONE >=64 RESISTANT Resistant     CIPROFLOXACIN 0.5 INTERMEDIATE Intermediate     GENTAMICIN <=1 SENSITIVE Sensitive     IMIPENEM <=0.25 SENSITIVE Sensitive     NITROFURANTOIN 64 INTERMEDIATE Intermediate     TRIMETH/SULFA >=320 RESISTANT Resistant     AMPICILLIN/SULBACTAM 16 INTERMEDIATE Intermediate     PIP/TAZO <=4 SENSITIVE Sensitive     * >=100,000 COLONIES/mL KLEBSIELLA PNEUMONIAE    IMAGING RESULTS: CT renal Non specific fat stranding surrounding the lower pole right kidney and bladder, which could reflect superimposed infection I have personally reviewed the films ? Impression/Recommendation ESBL klebsiella with urinary symptoms of dysuria, frequency . No fever or flank pain Pt has been on multiple course of antibiotic for cellulitis and that could have led to MDR bacteria Pt has always had bacteria in the urine Risk factors could be the following  Being on letrazole an aromatase inhibitor - ( can check with ehr oncologist regarding use of estrase topical vaginal cream) Inability to perform proper hygiene ( to wash after defectaion,  use cetaphil to wash) Also inability to collect a mid stream sample  Symptom are much improved with  meropenem- day 2 of antibiotic -will do ertapenem for a total of 5 days. 09/30/22  ? Severe lymphedema both legs- goes to the lymphedema clinic  Ca breast s/p surgery /radiation on letrazole? ___________________________________________________ Discussed with patient,and her husband in detail Note:  This document was prepared using Dragon voice recognition software and may include unintentional dictation errors.

## 2022-09-28 ENCOUNTER — Ambulatory Visit: Payer: Medicare Other | Admitting: Occupational Therapy

## 2022-09-28 DIAGNOSIS — Z1612 Extended spectrum beta lactamase (ESBL) resistance: Secondary | ICD-10-CM | POA: Diagnosis not present

## 2022-09-28 DIAGNOSIS — B9689 Other specified bacterial agents as the cause of diseases classified elsewhere: Secondary | ICD-10-CM | POA: Diagnosis not present

## 2022-09-28 DIAGNOSIS — I890 Lymphedema, not elsewhere classified: Secondary | ICD-10-CM | POA: Diagnosis not present

## 2022-09-28 DIAGNOSIS — N3000 Acute cystitis without hematuria: Secondary | ICD-10-CM

## 2022-09-28 DIAGNOSIS — N390 Urinary tract infection, site not specified: Secondary | ICD-10-CM | POA: Diagnosis not present

## 2022-09-28 LAB — CBC WITH DIFFERENTIAL/PLATELET
Abs Immature Granulocytes: 0.05 10*3/uL (ref 0.00–0.07)
Basophils Absolute: 0.1 10*3/uL (ref 0.0–0.1)
Basophils Relative: 1 %
Eosinophils Absolute: 0.3 10*3/uL (ref 0.0–0.5)
Eosinophils Relative: 4 %
HCT: 48.6 % — ABNORMAL HIGH (ref 36.0–46.0)
Hemoglobin: 15.2 g/dL — ABNORMAL HIGH (ref 12.0–15.0)
Immature Granulocytes: 1 %
Lymphocytes Relative: 30 %
Lymphs Abs: 2.2 10*3/uL (ref 0.7–4.0)
MCH: 29.9 pg (ref 26.0–34.0)
MCHC: 31.3 g/dL (ref 30.0–36.0)
MCV: 95.5 fL (ref 80.0–100.0)
Monocytes Absolute: 0.5 10*3/uL (ref 0.1–1.0)
Monocytes Relative: 7 %
Neutro Abs: 4.2 10*3/uL (ref 1.7–7.7)
Neutrophils Relative %: 57 %
Platelets: 210 10*3/uL (ref 150–400)
RBC: 5.09 MIL/uL (ref 3.87–5.11)
RDW: 14.2 % (ref 11.5–15.5)
WBC: 7.3 10*3/uL (ref 4.0–10.5)
nRBC: 0 % (ref 0.0–0.2)

## 2022-09-28 LAB — BASIC METABOLIC PANEL
Anion gap: 10 (ref 5–15)
BUN: 23 mg/dL (ref 8–23)
CO2: 26 mmol/L (ref 22–32)
Calcium: 9.2 mg/dL (ref 8.9–10.3)
Chloride: 101 mmol/L (ref 98–111)
Creatinine, Ser: 0.69 mg/dL (ref 0.44–1.00)
GFR, Estimated: >60 mL/min (ref >60)
Glucose, Bld: 178 mg/dL — ABNORMAL HIGH (ref 70–99)
Potassium: 4.4 mmol/L (ref 3.5–5.1)
Sodium: 137 mmol/L (ref 135–145)

## 2022-09-28 LAB — GLUCOSE, CAPILLARY
Glucose-Capillary: 171 mg/dL — ABNORMAL HIGH (ref 70–99)
Glucose-Capillary: 203 mg/dL — ABNORMAL HIGH (ref 70–99)
Glucose-Capillary: 187 mg/dL — ABNORMAL HIGH (ref 70–99)
Glucose-Capillary: 170 mg/dL — ABNORMAL HIGH (ref 70–99)

## 2022-09-28 MED ORDER — SPIRONOLACTONE 25 MG PO TABS
50.0000 mg | ORAL_TABLET | Freq: Every day | ORAL | Status: DC
  Administered 2022-09-28 – 2022-09-29 (×2): 50 mg via ORAL
  Filled 2022-09-28 (×2): qty 2

## 2022-09-28 NOTE — Progress Notes (Addendum)
Progress Note   Patient: Sharon Ware:[id number] DOB: 24-Feb-1951 DOA: 09/25/2022     3 DOS: the patient was seen and examined on 09/28/2022      Subjective:  Patient seen and examined at bedside this morning Admits to significant improvement Left with 2 more days of IV antibiotics She denied nausea vomiting abdominal pain chest pain or cough   Brief hospital course: From HPI "Sharon Ware is a 72 y.o. female with medical history significant of hypertension, hyperlipidemia, type 2 diabetes, stage Ia breast cancer s/p radiation on letrozole, lymphedema, who presents to the ED due to UTI.   Sharon Ware states that for approximately 2 weeks now, she has been experiencing dysuria, urinary frequency and urgency that has continued to progress despite treatment with Keflex and fosfomycin.  She and her husband note that they have known about her low oxygen levels for approximately 1 year now since they have gotten a pulse oximeter at home.  No workup has been done to identify the underlying etiology.   ED course: On arrival to the ED, patient was normotensive at 123/71 with heart rate of 85.  She was saturating at 94% on room air.  She was afebrile at 98.4.Initial workup demonstrates WBC of 11.6, hemoglobin 16.1, bicarb 25, glucose 144, BUN 30, creatinine 1.09 with GFR 53.  Urinalysis obtained today demonstrates large leukocytes, and over 50 WBC/hpf.  Patient started on meropenem due to known ESBL Klebsiella UTI.  TRH contacted for admission."   Assessment and Plan: Urinary tract infection with possible right-sided acute pyelonephritis Patient is presenting with approximately ~14 day history of dysuria, urgency and frequency with previous urine culture growing multidrug-resistant Klebsiella.  Patient has not had any improvement after dose of fosfomycin. CT scan of the abdomen shows some stranding around the right kidney concerning for possible pyelonephritis Meropenem has been switched  to ertapenem by ID to complete 5 days course with last dose on 09/30/2022 Continue Tylenol as needed for fever ID on board and case discussed I Appreciate input of Dr. Avon Gully   AKI (acute kidney injury) (HCC)-improved Unclear etiology, potentially due to underlying infection.  CT scan showed small calculi Continue IV fluid Monitor BMP   Thoracic aortic aneurysm Thoracic aortic aneurysm of 4.2 cm According to recommendation by radiologist to have repeat imaging in 1 year   Hypoxia likely secondary to obesity hypoventilation syndrome Patient states she has noted that her oxygen levels are usually around 88 to 90% for approximately 1 year now.  She denies any shortness of breath or known sleep apnea.  She denies any smoking history, known COPD or asthma.   CT scan of the chest did not show PE or infiltrate We will continue to monitor saturation At this time patient is not needing any oxygen    Controlled diabetes mellitus type 2 with complications (HCC) - Hold home antiglycemic agents Continue current insulin therapy   Hypertension associated with diabetes (HCC) - Resumed losartan and hydrochlorothiazide and spironolactone Meter blood pressure and electrolytes closely   malignant neoplasm of upper-inner quadrant of left breast in female, estrogen receptor positive (HCC) Patient has a history of stage Ia breast cancer s/p radiation. Continue home dose of letrozole   Lymphedema Stable at this time.   Advance Care Planning:   Code Status: Full Code    Consults: None   Family Communication: Patient's husband updated at bedside   Physical Exam:   Constitutional:      Appearance: Morbid obesity HENT:  Head: Normocephalic and atraumatic.     Mouth/Throat:     Mouth: Mucous membranes are moist.  Cardiovascular:     Rate and Rhythm: Normal rate and regular rhythm.  Pulmonary:     Effort: Pulmonary effort is normal. No respiratory distress.     Breath sounds: Normal  breath sounds. No wheezing, rhonchi or rales.  Abdominal:     General: Bowel sounds are normal. There is no distension.  Musculoskeletal:     Comments: Bilateral lymphedema noted  Skin:    General: Skin is warm and dry.     Comments: Some erythema above the ankle of the right lower extremity, nontender  Neurological:     General: No focal deficit present.  Psychiatric:        Mood and Affect: Mood normal.       Data Reviewed: Patient's labs reviewed by me showing sodium 137 potassium 4.4 creatinine 0.69 WBC 7.3 hemoglobin 15.2       Vitals:   09/27/22 1557 09/27/22 1953 09/28/22 0449 09/28/22 0753  BP: (!) 142/66 135/75 115/66 127/69  Pulse: 80 70 76 65  Resp: 20 18 18 20   Temp: 98 F (36.7 C) (!) 97.3 F (36.3 C) 97.7 F (36.5 C) 97.8 F (36.6 C)  TempSrc:  Oral Oral Oral  SpO2: 94% 93% 93% 92%  Weight:      Height:         Author: Loyce Dys, MD 09/28/2022 2:42 PM  For on call review [email or web address].

## 2022-09-28 NOTE — Care Management Important Message (Signed)
Important Message  Patient Details  Name: Sharon Ware MRN: [id number] Date of Birth: Dec 31, 1950   Medicare Important Message Given:  N/A - LOS <3 / Initial given by admissions     Olegario Messier A Charvis Lightner 09/28/2022, 8:57 AM

## 2022-09-28 NOTE — Progress Notes (Signed)
Date of Admission:  09/25/2022     ID: Sharon Ware is a 72 y.o. female  Principal Problem:   Urinary tract infection Active Problems:   Controlled diabetes mellitus type 2 with complications (HCC)   Hypertension associated with diabetes (HCC)   Malignant neoplasm of upper-inner quadrant of left breast in female, estrogen receptor positive (HCC)   AKI (acute kidney injury) (HCC)   Lymphedema   Hypoxia    Subjective: Doing much better  Medications:   aspirin EC  81 mg Oral Daily   enoxaparin (LOVENOX) injection  0.5 mg/kg Subcutaneous Q24H   losartan  50 mg Oral Daily   And   hydrochlorothiazide  12.5 mg Oral Daily   insulin aspart  0-15 Units Subcutaneous TID WC   letrozole  2.5 mg Oral Daily   pravastatin  20 mg Oral q1800   sodium chloride flush  3 mL Intravenous Q12H   spironolactone  50 mg Oral Daily    Objective: Vital signs in last 24 hours: Patient Vitals for the past 24 hrs:  BP Temp Temp src Pulse Resp SpO2  09/28/22 1527 119/64 98 F (36.7 C) Oral 76 19 94 %  09/28/22 1519 119/64 98 F (36.7 C) Oral 76 19 94 %  09/28/22 0753 127/69 97.8 F (36.6 C) Oral 65 20 92 %  09/28/22 0449 115/66 97.7 F (36.5 C) Oral 76 18 93 %      PHYSICAL EXAM:  General: Alert, cooperative, no distress, appears stated age.  Increased BMI Lungs: Clear to auscultation bilaterally. No Wheezing or Rhonchi. No rales. Heart: Regular rate and rhythm, no murmur, rub or gallop. Abdomen: Soft, non-tender,not distended. Bowel sounds normal. No masses Extremities: Bilateral lymphedema.  Left leg wrapped Skin: No rashes or lesions. Or bruising Lymph: Cervical, supraclavicular normal. Neurologic: Grossly non-focal  Lab Results    Latest Ref Rng & Units 09/28/2022    4:38 AM 09/27/2022    5:14 AM 09/26/2022    5:08 AM  CBC  WBC 4.0 - 10.5 K/uL 7.3  7.4  7.5   Hemoglobin 12.0 - 15.0 g/dL 43.3  29.5  18.8   Hematocrit 36.0 - 46.0 % 48.6  45.7  44.3   Platelets 150 - 400 K/uL  210  211  189        Latest Ref Rng & Units 09/28/2022    4:38 AM 09/27/2022    5:14 AM 09/26/2022    5:08 AM  CMP  Glucose 70 - 99 mg/dL 416  606  301   BUN 8 - 23 mg/dL 23  20  26    Creatinine 0.44 - 1.00 mg/dL 6.01  0.93  2.35   Sodium 135 - 145 mmol/L 137  141  139   Potassium 3.5 - 5.1 mmol/L 4.4  4.3  3.9   Chloride 98 - 111 mmol/L 101  105  102   CO2 22 - 32 mmol/L 26  28  27    Calcium 8.9 - 10.3 mg/dL 9.2  9.2  9.2       Microbiology:  Studies/Results: No results found.   Assessment/Plan: ESBL klebsiella with urinary symptoms of dysuria, frequency . No fever or flank pain Pt has been on multiple course of antibiotic for cellulitis and that could have led to MDR bacteria Pt has always had bacteria in the urine Risk factors could be the following  Being on letrazole an aromatase inhibitor - ( can check with her oncologist regarding use of estrase topical vaginal  cream) Inability to perform proper hygiene ( to wash after defectaion,  use cetaphil to wash) Also inability to collect a mid stream sample   Symptom are much improved with meropenem- day 3 1/2 of antibiotic -will do ertapenem for a total of 5 days.can be discharged after tomorrow's dose of ertapenem   ? Severe lymphedema both legs- goes to the lymphedema clinic   Ca breast s/p surgery /radiation on letrazole? ___ Discussed the management with the patient.

## 2022-09-28 NOTE — Plan of Care (Signed)
  Problem: Coping: Goal: Ability to adjust to condition or change in health will improve Outcome: Progressing   Problem: Fluid Volume: Goal: Ability to maintain a balanced intake and output will improve Outcome: Progressing   Problem: Metabolic: Goal: Ability to maintain appropriate glucose levels will improve Outcome: Progressing   Problem: Nutritional: Goal: Maintenance of adequate nutrition will improve Outcome: Progressing Goal: Progress toward achieving an optimal weight will improve Outcome: Progressing   Problem: Skin Integrity: Goal: Risk for impaired skin integrity will decrease Outcome: Progressing   Problem: Tissue Perfusion: Goal: Adequacy of tissue perfusion will improve Outcome: Progressing   Problem: Education: Goal: Knowledge of General Education information will improve Description: Including pain rating scale, medication(s)/side effects and non-pharmacologic comfort measures Outcome: Progressing   Problem: Health Behavior/Discharge Planning: Goal: Ability to manage health-related needs will improve Outcome: Progressing   Problem: Safety: Goal: Ability to remain free from injury will improve Outcome: Progressing   Problem: Urinary Elimination: Goal: Signs and symptoms of infection will decrease Outcome: Progressing

## 2022-09-29 DIAGNOSIS — B9689 Other specified bacterial agents as the cause of diseases classified elsewhere: Secondary | ICD-10-CM

## 2022-09-29 DIAGNOSIS — N179 Acute kidney failure, unspecified: Secondary | ICD-10-CM | POA: Diagnosis not present

## 2022-09-29 DIAGNOSIS — N390 Urinary tract infection, site not specified: Secondary | ICD-10-CM

## 2022-09-29 DIAGNOSIS — E1159 Type 2 diabetes mellitus with other circulatory complications: Secondary | ICD-10-CM | POA: Diagnosis not present

## 2022-09-29 LAB — GLUCOSE, CAPILLARY: Glucose-Capillary: 140 mg/dL — ABNORMAL HIGH (ref 70–99)

## 2022-09-29 LAB — BASIC METABOLIC PANEL
Anion gap: 8 (ref 5–15)
BUN: 29 mg/dL — ABNORMAL HIGH (ref 8–23)
CO2: 30 mmol/L (ref 22–32)
Calcium: 9.2 mg/dL (ref 8.9–10.3)
Chloride: 99 mmol/L (ref 98–111)
Creatinine, Ser: 0.83 mg/dL (ref 0.44–1.00)
GFR, Estimated: >60 mL/min (ref >60)
Glucose, Bld: 171 mg/dL — ABNORMAL HIGH (ref 70–99)
Potassium: 4.7 mmol/L (ref 3.5–5.1)
Sodium: 137 mmol/L (ref 135–145)

## 2022-09-29 LAB — CBC WITH DIFFERENTIAL/PLATELET
Abs Immature Granulocytes: 0.04 10*3/uL (ref 0.00–0.07)
Basophils Absolute: 0 10*3/uL (ref 0.0–0.1)
Basophils Relative: 1 %
Eosinophils Absolute: 0.3 10*3/uL (ref 0.0–0.5)
Eosinophils Relative: 4 %
HCT: 48.5 % — ABNORMAL HIGH (ref 36.0–46.0)
Hemoglobin: 15.3 g/dL — ABNORMAL HIGH (ref 12.0–15.0)
Immature Granulocytes: 1 %
Lymphocytes Relative: 34 %
Lymphs Abs: 2.4 10*3/uL (ref 0.7–4.0)
MCH: 29.8 pg (ref 26.0–34.0)
MCHC: 31.5 g/dL (ref 30.0–36.0)
MCV: 94.5 fL (ref 80.0–100.0)
Monocytes Absolute: 0.5 10*3/uL (ref 0.1–1.0)
Monocytes Relative: 7 %
Neutro Abs: 3.8 10*3/uL (ref 1.7–7.7)
Neutrophils Relative %: 53 %
Platelets: 211 10*3/uL (ref 150–400)
RBC: 5.13 MIL/uL — ABNORMAL HIGH (ref 3.87–5.11)
RDW: 14.2 % (ref 11.5–15.5)
WBC: 7 10*3/uL (ref 4.0–10.5)
nRBC: 0 % (ref 0.0–0.2)

## 2022-09-29 NOTE — Discharge Summary (Signed)
Physician Discharge Summary   Patient: Sharon Ware MRN: [id number] DOB: Nov 22, 1950  Admit date:     09/25/2022  Discharge date: 09/29/22  Discharge Physician: Marrion Coy   PCP: Dorcas Carrow, DO   Recommendations at discharge:   Follow-up with PCP in 1 week.  Discharge Diagnoses: Principal Problem:   Urinary tract infection due to ESBL Klebsiella Active Problems:   AKI (acute kidney injury) (HCC)   Hypoxia   Controlled diabetes mellitus type 2 with complications (HCC)   Hypertension associated with diabetes (HCC)   Malignant neoplasm of upper-inner quadrant of left breast in female, estrogen receptor positive (HCC)   Lymphedema   Acute cystitis without hematuria Morbid obese with BMI 62.7. Resolved Problems:   * No resolved hospital problems. [facility] Course:  From HPI "Sharon Ware is a 72 y.o. female with medical history significant of hypertension, hyperlipidemia, type 2 diabetes, stage Ia breast cancer s/p radiation on letrozole, lymphedema, who presents to the ED due to UTI.   Sharon Ware states that for approximately 2 weeks now, she has been experiencing dysuria, urinary frequency and urgency that has continued to progress despite treatment with Keflex and fosfomycin.  She and her husband note that they have known about her low oxygen levels for approximately 1 year now since they have gotten a pulse oximeter at home.  No workup has been done to identify the underlying etiology.   ED course: On arrival to the ED, patient was normotensive at 123/71 with heart rate of 85.  She was saturating at 94% on room air.  She was afebrile at 98.4.Initial workup demonstrates WBC of 11.6, hemoglobin 16.1, bicarb 25, glucose 144, BUN 30, creatinine 1.09 with GFR 53.  Urinalysis obtained today demonstrates large leukocytes, and over 50 WBC/hpf.  Patient started on meropenem due to known ESBL Klebsiella UTI.  [facility] contacted for admission.  Patient is seen by ID, switch to  ertapenem.  Per ID, patient can be discharged after today's ertapenem, condition has improved.  Assessment and Plan: Urinary tract infection with possible right-sided acute pyelonephritis Patient is presenting with approximately ~14 day history of dysuria, urgency and frequency with previous urine culture growing multidrug-resistant Klebsiella.  Patient has not had any improvement after dose of fosfomycin as an outpatient. CT scan of the abdomen shows some stranding around the right kidney concerning for possible pyelonephritis Meropenem has been switched to ertapenem by ID to complete 5 days. Per ID, 5-day dose will be completed after today's ertapenem.   AKI (acute kidney injury) (HCC)-improved Renal function stable.   Thoracic aortic aneurysm Thoracic aortic aneurysm of 4.2 cm According to recommendation by radiologist to have repeat imaging in 1 year   Hypoxia likely secondary to obesity hypoventilation syndrome Patient states she has noted that her oxygen levels are usually around 88 to 90% for approximately 1 year now.  She denies any shortness of breath or known sleep apnea.  She denies any smoking history, known COPD or asthma.   CT scan of the chest did not show PE or infiltrate We will continue to monitor saturation At this time patient is not needing any oxygen     Controlled diabetes mellitus type 2 with complications (HCC) - Hold home antiglycemic agents Continue current insulin therapy   Hypertension associated with diabetes (HCC) - Resumed losartan and hydrochlorothiazide and spironolactone    malignant neoplasm of upper-inner quadrant of left breast in female, estrogen receptor positive (HCC) Patient has a history of stage  Ia breast cancer s/p radiation. Continue home dose of letrozole   Lymphedema Stable at this time.       Consultants: ID Procedures performed: None  Disposition: Home Diet recommendation:  Discharge Diet Orders (From admission, onward)      Start     Ordered   09/29/22 0000  Diet - low sodium heart healthy        09/29/22 1039           Cardiac diet DISCHARGE MEDICATION: Allergies as of 09/29/2022       Reactions   Other Anaphylaxis   Honey Dew Melon   Wound Dressing Adhesive Rash   Ciprofloxacin    Back spasms   Morphine And Codeine Other (See Comments)   Hypotension   Latex Rash        Medication List     STOP taking these medications    fluconazole 150 MG tablet Commonly known as: Diflucan   phenazopyridine 200 MG tablet Commonly known as: PYRIDIUM       TAKE these medications    acetaminophen 650 MG CR tablet Commonly known as: TYLENOL Take 650 mg by mouth every 8 (eight) hours as needed for pain.   aspirin 81 MG tablet Take 81 mg by mouth daily.   CALCIUM CITRATE + D3 PO Take 1,200 mg by mouth daily.   dapagliflozin propanediol 10 MG Tabs tablet Commonly known as: Farxiga Take 1 tablet (10 mg total) by mouth daily before breakfast.   Fish Oil Ultra 1400 MG Caps Take 1,400 mg by mouth daily.   Glucosamine HCl 1500 MG Tabs Take 1,500 mg by mouth in the morning and at bedtime.   ICY HOT EX Apply 1 application topically daily as needed (pain).   letrozole 2.5 MG tablet Commonly known as: FEMARA Take 1 tablet (2.5 mg total) by mouth daily.   losartan-hydrochlorothiazide 50-12.5 MG tablet Commonly known as: HYZAAR Take 1 tablet by mouth daily.   lovastatin 20 MG tablet Commonly known as: MEVACOR TAKE 1 TABLET BY MOUTH EVERYDAY AT BEDTIME What changed:  how much to take how to take this when to take this   metFORMIN 500 MG 24 hr tablet Commonly known as: GLUCOPHAGE-XR Take 2 tablets (1,000 mg total) by mouth 2 (two) times daily with a meal.   spironolactone 50 MG tablet Commonly known as: ALDACTONE Take 1 tablet (50 mg total) by mouth daily.   Vitamin D3 125 MCG (5000 UT) Tabs Take 5,000 Units by mouth daily.        Follow-up Information     Olevia Perches P, DO Follow up in 1 week(s).   Specialty: Family Medicine Contact information: [address] [place] [address] [phone]                Discharge Exam: Ceasar Mons Weights   09/25/22 1248  Weight: (!) 160.6 kg   General exam: Appears calm and comfortable, morbid obese. Respiratory system: Clear to auscultation. Respiratory effort normal. Cardiovascular system: S1 & S2 heard, RRR. No JVD, murmurs, rubs, gallops or clicks. Gastrointestinal system: Abdomen is nondistended, soft and nontender. No organomegaly or masses felt. Normal bowel sounds heard. Central nervous system: Alert and oriented. No focal neurological deficits. Extremities: Left lower extremity lymphedema. Skin: No rashes, lesions or ulcers Psychiatry: Judgement and insight appear normal. Mood & affect appropriate.    Condition at discharge: fair  The results of significant diagnostics from this hospitalization (including imaging, microbiology, ancillary and laboratory) are listed below for  reference.   Imaging Studies: CT CHEST WO CONTRAST  Result Date: 09/25/2022 CLINICAL DATA:  Chronic hypoxia. EXAM: CT CHEST WITHOUT CONTRAST TECHNIQUE: Multidetector CT imaging of the chest was performed following the standard protocol without IV contrast. RADIATION DOSE REDUCTION: This exam was performed according to the departmental dose-optimization program which includes automated exposure control, adjustment of the mA and/or kV according to patient size and/or use of iterative reconstruction technique. COMPARISON:  None Available. FINDINGS: Cardiovascular: 4.2 cm ascending thoracic aortic aneurysm. Normal cardiac size. No pericardial effusion. Enlarged pulmonary artery is noted suggesting pulmonary artery hypertension. Coronary artery calcifications are noted. Mediastinum/Nodes: Small sliding-type hiatal hernia. No adenopathy. Thyroid gland is unremarkable. Lungs/Pleura: Lungs are clear. No pleural effusion or pneumothorax.  Upper Abdomen: No acute abnormality. Musculoskeletal: Severe degenerative changes seen involving the left glenohumeral joint. No acute osseous abnormality is noted. IMPRESSION: 4.2 cm ascending thoracic aortic aneurysm. Recommend annual imaging followup by CTA or MRA. This recommendation follows 2010 ACCF/AHA/AATS/ACR/ASA/SCA/SCAI/SIR/STS/SVM Guidelines for the Diagnosis and Management of Patients with Thoracic Aortic Disease. Circulation. 2010; 121: Q657-Q469. Aortic aneurysm NOS (ICD10-I71.9). Small sliding-type hiatal hernia. Coronary artery calcifications are noted. Electronically Signed   By: Lupita Raider M.D.   On: 09/25/2022 16:33   CT RENAL STONE STUDY  Result Date: 09/25/2022 CLINICAL DATA:  Complicated urinary tract infection, dysuria and urinary frequency EXAM: CT ABDOMEN AND PELVIS WITHOUT CONTRAST TECHNIQUE: Multidetector CT imaging of the abdomen and pelvis was performed following the standard protocol without IV contrast. RADIATION DOSE REDUCTION: This exam was performed according to the departmental dose-optimization program which includes automated exposure control, adjustment of the mA and/or kV according to patient size and/or use of iterative reconstruction technique. COMPARISON:  None Available. FINDINGS: Lower chest: No acute pleural or parenchymal lung disease. Hepatobiliary: Unremarkable unenhanced appearance of the liver and gallbladder. No biliary duct dilation. Pancreas: Unremarkable unenhanced appearance. Spleen: Unremarkable unenhanced appearance. Adrenals/Urinary Tract: There are bilateral nonobstructing renal calculi. On the right, there are 3 distinct calculi, largest measuring up to 13 mm in the right renal pelvis. On the left, there is a single calculus in an upper pole calyx measuring up to 6 mm. There is mild fat stranding surrounding the lower pole right kidney and bladder, which could reflect superimposed infection. Evaluation of the renal parenchyma is limited without IV  contrast. The adrenals are unremarkable. Stomach/Bowel: No bowel obstruction or ileus. Normal appendix right lower quadrant. No bowel wall thickening or inflammatory change. Small hiatal hernia. Vascular/Lymphatic: Aortic atherosclerosis. No enlarged abdominal or pelvic lymph nodes. Reproductive: Status post hysterectomy. No adnexal masses. Other: No free fluid or free intraperitoneal gas. Small fat containing umbilical hernia. Musculoskeletal: No acute or destructive bony abnormalities. Reconstructed images demonstrate no additional findings. IMPRESSION: 1. Bilateral nonobstructing renal calculi, largest on the right measuring up to 13 mm. 2. Nonspecific fat stranding surrounding the lower pole right kidney and bladder, which could reflect superimposed infection. Please correlate with urinalysis. 3. Small hiatal hernia. 4.  Aortic Atherosclerosis (ICD10-I70.0). Electronically Signed   By: Sharlet Salina M.D.   On: 09/25/2022 16:31    Microbiology: Results for orders placed or performed during the hospital encounter of 09/18/22  Urine Culture     Status: Abnormal   Collection Time: 09/18/22 10:28 AM   Specimen: Urine, Random  Result Value Ref Range Status   Specimen Description   Final    URINE, RANDOM Performed at [facility], [address] [place], [place] [address]    Special Requests  Final    NONE Reflexed from [id number] Performed at [facility], [address], [place], [place] [address]    Culture (A)  Final    >=100,000 COLONIES/mL KLEBSIELLA PNEUMONIAE Confirmed Extended Spectrum Beta-Lactamase Producer (ESBL).  In bloodstream infections from ESBL organisms, carbapenems are preferred over piperacillin/tazobactam. They are shown to have a lower risk of mortality.    Report Status 09/20/2022 FINAL  Final   Organism ID, Bacteria KLEBSIELLA PNEUMONIAE (A)  Final      Susceptibility   Klebsiella pneumoniae - MIC*    AMPICILLIN >=32 RESISTANT Resistant      CEFAZOLIN >=64 RESISTANT Resistant     CEFEPIME >=32 RESISTANT Resistant     CEFTRIAXONE >=64 RESISTANT Resistant     CIPROFLOXACIN 0.5 INTERMEDIATE Intermediate     GENTAMICIN <=1 SENSITIVE Sensitive     IMIPENEM <=0.25 SENSITIVE Sensitive     NITROFURANTOIN 64 INTERMEDIATE Intermediate     TRIMETH/SULFA >=320 RESISTANT Resistant     AMPICILLIN/SULBACTAM 16 INTERMEDIATE Intermediate     PIP/TAZO <=4 SENSITIVE Sensitive     * >=100,000 COLONIES/mL KLEBSIELLA PNEUMONIAE    Labs: CBC: Recent Labs  Lab 09/25/22 1428 09/26/22 0508 09/27/22 0514 09/28/22 0438 09/29/22 0516  WBC 11.6* 7.5 7.4 7.3 7.0  NEUTROABS 7.4  --  4.4 4.2 3.8  HGB 16.1* 14.1 14.5 15.2* 15.3*  HCT 50.4* 44.3 45.7 48.6* 48.5*  MCV 93.7 94.5 94.8 95.5 94.5  PLT 222 189 211 210 211   Basic Metabolic Panel: Recent Labs  Lab 09/25/22 1428 09/26/22 0508 09/27/22 0514 09/28/22 0438 09/29/22 0516  NA 139 139 141 137 137  K 4.4 3.9 4.3 4.4 4.7  CL 99 102 105 101 99  CO2 25 27 28 26 30   GLUCOSE 144* 151* 161* 178* 171*  BUN 30* 26* 20 23 29*  CREATININE 1.09* 0.71 0.76 0.69 0.83  CALCIUM 10.0 9.2 9.2 9.2 9.2   Liver Function Tests: Recent Labs  Lab 09/25/22 1428  AST 31  ALT 28  ALKPHOS 52  BILITOT 0.7  PROT 8.1  ALBUMIN 3.8   CBG: Recent Labs  Lab 09/28/22 0751 09/28/22 1218 09/28/22 1554 09/28/22 2056 09/29/22 0752  GLUCAP 171* 203* 187* 170* 140*    Discharge time spent: greater than 30 minutes.  Signed: Marrion Coy, MD [facility] 09/29/2022

## 2022-09-29 NOTE — Progress Notes (Signed)
Mobility Specialist - Progress Note   09/29/22 0906  Mobility  Activity Stood at bedside;Dangled on edge of bed  Level of Assistance Standby assist, set-up cues, supervision of patient - no hands on  Assistive Device Other (Comment) (Chair (Doesn't like RW))  Distance Ambulated (ft) 0 ft  Activity Response Tolerated well  Mobility Referral Yes  $Mobility charge 1 Mobility  Mobility Specialist Start Time (ACUTE ONLY) [id number]  Mobility Specialist Stop Time (ACUTE ONLY) 0905  Mobility Specialist Time Calculation (min) (ACUTE ONLY) 23 min   Pt supine in bed on RA upon arrival. Pt completes bed mobility indep. Pt uses chair to stand, defers RW due to wheels. Pt able to maintain standing balance 1~2 minutes Supervision with no LOB noted. Pt returns to bed with needs in reach and bed alarm active.   Terrilyn Saver  Mobility Specialist  09/29/22 9:08 AM

## 2022-09-29 NOTE — Consult Note (Addendum)
[facility] Customer service manager [facility]) Accountable Care Organization (ACO) [facility] Liaison Note  09/29/2022  [name] 09-Jun-1950 [id number]  Location: [facility] RN Hospital Liaison screened the patient remotely at [facility].  Insurance:  MCR   Sharon Ware is a 72 y.o. female who is a Optician, dispensing Care Patient of Dorcas Carrow, DO. The patient was screened for readmission hospitalization with noted low risk score for unplanned readmission risk with 1 IP in 6 months.  The patient was assessed for potential [facility]) Care Management service needs for post hospital transition for care coordination. Review of patient's electronic medical record reveals patient was admitted for Acute Cystitis without hematuria.  Plan: [facility] Liaison will continue to follow progress and disposition to asess for post hospital community care coordination/management needs.  Referral request for community care coordination: anticipate [facility] Transitions of Care Team follow up.   [facility] Care Management/Population Health does not replace or interfere with any arrangements made by the Inpatient Transition of Care team.   For questions contact:   Elliot Cousin, RN, [facility] Liaison [place]   Population Health Office Hours MTWF  8:00 am-6:00 pm Off on Thursday [phone] mobile [phone] [Office toll free line] Office Hours are M-F 8:30 - 5 pm 24 hour nurse advise line [phone] [email or web address]  [name]

## 2022-09-29 NOTE — Care Management Important Message (Signed)
Important Message  Patient Details  Name: Sharon Ware MRN: [id number] Date of Birth: 1950-04-14   Medicare Important Message Given:  Yes     [name] 09/29/2022, 11:18 AM

## 2022-09-30 ENCOUNTER — Telehealth: Payer: Self-pay | Admitting: *Deleted

## 2022-09-30 NOTE — Transitions of Care (Post Inpatient/ED Visit) (Signed)
09/30/2022  Name: Sharon Ware MRN: [id number] DOB: Mar 14, 1951  Today's TOC FU Call Status: Today's TOC FU Call Status:: Successful TOC FU Call Competed TOC FU Call Complete Date: 09/30/22  Transition Care Management Follow-up Telephone Call Date of Discharge: 09/29/22 Discharge Facility: [facility]) Type of Discharge: Inpatient Admission Primary Inpatient Discharge Diagnosis:: Urinary tract infection due to ESBL Klebsiella How have you been since you were released from the hospital?: Better Any questions or concerns?: No  Items Reviewed: Did you receive and understand the discharge instructions provided?: No Medications obtained,verified, and reconciled?: Yes (Medications Reviewed) Any new allergies since your discharge?: No Dietary orders reviewed?: No Do you have support at home?: Yes People in Home: spouse Name of Support/Comfort Primary Source: Fayrene Fearing  Medications Reviewed Today: Medications Reviewed Today     Reviewed by Luella Cook, RN (Case Manager) on 09/30/22 at 1135  Med List Status: <None>   Medication Order Taking? Sig Documenting Provider Last Dose Status Informant  acetaminophen (TYLENOL) 650 MG CR tablet [id number] Yes Take 650 mg by mouth every 8 (eight) hours as needed for pain. [provider] Taking Active Self  aspirin 81 MG tablet [id number] Yes Take 81 mg by mouth daily. [provider] Taking Active Self  Calcium Citrate-Vitamin D (CALCIUM CITRATE + D3 PO) [id number] Yes Take 1,200 mg by mouth daily. [provider] Taking Active Self  Cholecalciferol (VITAMIN D3) 125 MCG (5000 UT) TABS [id number] Yes Take 5,000 Units by mouth daily. [provider] Taking Active Self  dapagliflozin propanediol (FARXIGA) 10 MG TABS tablet [id number] Yes Take 1 tablet (10 mg total) by mouth daily before breakfast. Olevia Perches P, DO Taking Active Self  Glucosamine HCl 1500 MG TABS [id number] Yes Take  1,500 mg by mouth in the morning and at bedtime. [provider] Taking Active Self  letrozole [facility]) 2.5 MG tablet [id number] Yes Take 1 tablet (2.5 mg total) by mouth daily. Creig Hines, MD Taking Active Self  losartan-hydrochlorothiazide [facility]) 50-12.5 MG tablet [id number] Yes Take 1 tablet by mouth daily. Olevia Perches P, DO Taking Active Self  lovastatin (MEVACOR) 20 MG tablet [id number] Yes TAKE 1 TABLET BY MOUTH EVERYDAY AT BEDTIME  Patient taking differently: Take 20 mg by mouth at bedtime. TAKE 1 TABLET BY MOUTH EVERYDAY AT BEDTIME   Johnson, Megan P, DO Taking Active Self  Menthol, Topical Analgesic, (ICY HOT EX) [id number]  Apply 1 application topically daily as needed (pain). [provider]  Active Self  metFORMIN (GLUCOPHAGE-XR) 500 MG 24 hr tablet [id number] Yes Take 2 tablets (1,000 mg total) by mouth 2 (two) times daily with a meal. Johnson, Megan P, DO Taking Active Self  Omega-3 Fatty Acids (FISH OIL ULTRA) 1400 MG CAPS [id number] Yes Take 1,400 mg by mouth daily. [provider] Taking Active Self  spironolactone (ALDACTONE) 50 MG tablet [id number] Yes Take 1 tablet (50 mg total) by mouth daily. Dorcas Carrow, DO Taking Active Self            Home Care and Equipment/Supplies: Were Home Health Services Ordered?: NA Any new equipment or medical supplies ordered?: NA  Functional Questionnaire: Do you need assistance with bathing/showering or dressing?: No Do you need assistance with meal preparation?: No Do you need assistance with eating?: No Do you have difficulty maintaining continence: No Do you need assistance with getting out of bed/getting out of a chair/moving?: No Do you have difficulty managing or taking your medications?: No  Follow  up appointments reviewed: PCP Follow-up appointment confirmed?: Yes Date of PCP follow-up appointment?: 10/06/22 Follow-up Provider: Jacquelin Hawking [facility] Follow-up appointment  confirmed?: NA Do you need transportation to your follow-up appointment?: No Do you understand care options if your condition(s) worsen?: Yes-patient verbalized understanding  SDOH Interventions Today    Flowsheet Row Most Recent Value  SDOH Interventions   Food Insecurity Interventions Intervention Not Indicated  Housing Interventions Intervention Not Indicated  Transportation Interventions Intervention Not Indicated      Interventions Today    Flowsheet Row Most Recent Value  General Interventions   General Interventions Discussed/Reviewed General Interventions Discussed, General Interventions Reviewed, Doctor Visits  Doctor Visits Discussed/Reviewed Doctor Visits Discussed  Pharmacy Interventions   Pharmacy Dicussed/Reviewed Pharmacy Topics Reviewed      [facility] Interventions Today    Flowsheet Row Most Recent Value  TOC Interventions   TOC Interventions Discussed/Reviewed TOC Interventions Discussed, TOC Interventions Reviewed        Gean Maidens BSN RN [facility] [phone]

## 2022-10-01 ENCOUNTER — Encounter: Payer: Self-pay | Admitting: Occupational Therapy

## 2022-10-01 ENCOUNTER — Ambulatory Visit: Payer: Medicare Other | Admitting: Occupational Therapy

## 2022-10-01 DIAGNOSIS — L03116 Cellulitis of left lower limb: Secondary | ICD-10-CM | POA: Diagnosis not present

## 2022-10-01 DIAGNOSIS — I890 Lymphedema, not elsewhere classified: Secondary | ICD-10-CM

## 2022-10-01 DIAGNOSIS — R609 Edema, unspecified: Secondary | ICD-10-CM | POA: Diagnosis not present

## 2022-10-01 NOTE — Therapy (Signed)
OUTPATIENT OCCUPATIONAL THERAPY REEVALUATION AND TREATMENT  BILATERAL LOWER EXTREMITY LYMPHEDEMA  Patient Name: Sharon Ware MRN: [id number] DOB:11/17/1950, 72 y.o., female Today's Date: 10/01/2022  END OF SESSION:   OT End of Session - 10/01/22 0916     Visit Number 5    Number of Visits 36    Date for OT Re-Evaluation 12/06/22    OT Start Time 0900    OT Stop Time 1015    OT Time Calculation (min) 75 min    Activity Tolerance Patient tolerated treatment well;No increased pain    Behavior During Therapy WFL for tasks assessed/performed              Past Medical History:  Diagnosis Date   Arthritis    Breast cancer (HCC)    Cataract    Diabetes mellitus without complication (HCC)    Hyperlipidemia    Hypertension    Lymphedema    Vitamin D deficiency    Past Surgical History:  Procedure Laterality Date   ABDOMINAL HYSTERECTOMY     x2   BREAST BIOPSY Right 1990s   neg   BREAST BIOPSY Right 2000s   neg   BREAST BIOPSY Left 11/24/2020   u/s bx 10:30/8 cmfn-"heart" marker   BREAST EXCISIONAL BIOPSY Right    neg   BREAST LUMPECTOMY     BREAST LUMPECTOMY WITH RADIOFREQUENCY TAG IDENTIFICATION Left 12/10/2020   Procedure: BREAST LUMPECTOMY WITH RADIOFREQUENCY TAG IDENTIFICATION;  Surgeon: Campbell Lerner, MD;  Location: [facility] ORS;  Service: General;  Laterality: Left;   DILATION AND CURETTAGE OF UTERUS     EYE SURGERY  2004   cataract surgery and laser surgery   Salpingo-oophorectomy     Patient Active Problem List   Diagnosis Date Noted   Acute cystitis without hematuria 09/28/2022   Urinary tract infection due to ESBL Klebsiella 09/25/2022   AKI (acute kidney injury) (HCC) 09/25/2022   Lymphedema 09/25/2022   Hypoxia 09/25/2022   Cellulitis 08/04/2022   Edema 01/30/2021   Malignant neoplasm of upper-inner quadrant of left breast in female, estrogen receptor positive (HCC) 12/01/2020   Goals of care, counseling/discussion 12/01/2020   Morbid obesity  (HCC) 08/08/2018   BMI 60.0-69.9, adult (HCC) 08/08/2018   Controlled diabetes mellitus type 2 with complications (HCC) 08/08/2018   Hyperlipidemia 08/08/2018   Hypertension associated with diabetes (HCC) 08/08/2018   Arthritis of both knees 08/08/2018   Vitamin D deficiency 08/08/2018   PCP: Olevia Perches, DO  REFERRING PROVIDER: Gwynn Burly, DO  REFERRING DIAG: I89.0  THERAPY DIAG:  Lymphedema, not elsewhere classified  Rationale for Evaluation and Treatment: Rehabilitation  ONSET DATE: 1 yr ago without known precipitating event- by report  SUBJECTIVE:  SUBJECTIVE STATEMENT:Sharon Ware presents for OT treatment to address BLE lymphedema, L>R. Pt is accompanied by her spouse, Sharon Ware, who assisted her with transfer onto the Rx bed and who is learning to assist with compression wrapping. Pt was last seen on 09/19/22 due to 5 day hospitalization for  Urinary tract infection due to ESBL Klebsiella. Spouse continued to apply multilayer compression wraps to the L leg throughout her hospitalization, changing daily as instructed. Sharon Ware states he would like to review wrapping one more time.  Tobacco Use: Low Risk  (10/01/2022)   Patient History    Smoking Tobacco Use: Never    Smokeless Tobacco Use: Never    Passive Exposure: Not on file   PERTINENT HISTORY: HTN, Obesity w BMI 60-69.9, DM, B knee arthritis, Hx L breast cancer, recurrent L leg cellulitis. MRSA test results pending  PAIN: Are you having pain? Denies LE related pain  PRECAUTIONS: Fall and Other: lymphedema precautions, DM. Hx L Br Ca  PRIOR LEVEL OF FUNCTION: Independent with household mobility with device, Requires assistive device for independence, Needs assistance with homemaking, Needs assistance with gait, and Needs assistance with  transfers  PATIENT GOALS: ... Get improvement for my condition...., keep lymphedema from getting worse, and reduce infection risk"  LYMPHEDEMA ASSESSMENTS:  FOTO Functional Outcomes Measure: Initial 33%  Lymphedema Life Impact Scale (LLIS): 22.06%  BLE COMPARATIVE LIMB VOLUMETRICS Initial 09/14/22  LANDMARK RIGHT    R LEG (A-D) 7717.1 ml  R THIGH (E-G) ml  R FULL LIMB (A-G) ml  Limb Volume differential (LVD)  %  Volume change since initial %  Volume change overall V  (Blank rows = not tested)  LANDMARK LEFT   R LEG (A-D) 7752.1 ml  R THIGH (E-G) ml  R FULL LIMB (A-G) ml  Limb Volume differential (LVD)  Leg LVD measures 0.5%, L>R   Volume change since initial %  Volume change overall %  (Blank rows = not tested)   TODAY'S TREATMENT:                                                                                                                                         BLE comparative limb volumetrics Pt and family edu for LE self-management  PATIENT EDUCATION:  Continued Pt/ CG edu for lymphedema self care home program throughout session. Topics include outcome of comparative limb volumetrics- starting limb volume differentials (LVDs), technology and gradient techniques used for short stretch, multilayer compression wrapping, simple self-MLD, therapeutic lymphatic pumping exercises, skin/nail care, LE precautions,. compression garment recommendations and specifications, wear and care schedule and compression garment donning / doffing w assistive devices. Discussed progress towards all OT goals since commencing CDT. All questions answered to the Pt's satisfaction. Good return. Person educated: Patient and Spouse Education method: Explanation, Demonstration, and Handouts Education comprehension: verbalized understanding, returned demonstration, verbal cues required, and needs further education  Lymphedema HOME PROGRAM:Complete Decongestive Therapy (  CDT) Self-Management Phase Daily  simple self-MLD Daily BLE skin care and inspection Daily lymphatic pumping there ex Appropriate daily compression- knee length, will require max caregiver A to  and doff Leg elevation when seated  ASSESSMENT: CLINICAL IMPRESSION: L leg volume is obviously reduced since last see on 6/17, likely to elevating legs throughout hospital stay and convalescence. Pt reports she also lost several pounds of systemic fluid with diuresis in the hospital. Skin condition remains dry with hyperkeratosis and fatty fibrosis at distal legs and ankles. Skin color is mottled after unwrapping, but soon normalizes with elevation. Spouse did an excellent job applying compression wraps. We continued teaching during session to fine tune his technique. Continued LLE/LLQ MLD with simultaneous skin care utilizing functional inguinal LN, deep abdominal lymphatics, thoracic duct to hear. Pt tolerated will without dizziness, nausea, SOB.Cont as per POC.  OBJECTIVE IMPAIRMENTS: Abnormal gait, decreased activity tolerance, decreased balance, decreased endurance, decreased knowledge of condition, decreased knowledge of use of DME, decreased mobility, difficulty walking, decreased ROM, decreased strength, increased edema, impaired perceived functional ability, postural dysfunction, obesity, pain, and chronic progressive limb swelling with elevated infection risk and risk of non-healing wounds .   ACTIVITY LIMITATIONS: carrying, lifting, bending, sitting, standing, squatting, sleeping, stairs, transfers, bed mobility, bathing, toileting, dressing, hygiene/grooming, and caring for others  PARTICIPATION LIMITATIONS: meal prep, cleaning, laundry, driving, shopping, community activity, occupation, yard work, and church  PERSONAL FACTORS: 3+ comorbidities: HTN, Stage III Obesity, B knee arthritis pain and inflammation and impaired AROM 2/2 body habitus  are also affecting patient's functional outcome.   REHAB POTENTIAL:  Fair with daily CG  assistance throughout Intensive Phase CDT.  Poor without daily CG assistance with all LE self care home program components  GOALS: Goals reviewed with patient? Yes  SHORT TERM GOALS: Target date: 4th OT Rx visit   Pt will demonstrate understanding of lymphedema precautions and prevention strategies with modified independence using a printed reference to identify at least 5 precautions and discussing how s/he may implement them into daily life to reduce risk of progression with modified assistance ( printed reference). Baseline: Max A Goal status: Initial  2.  Pt will be able to apply multilayer, thigh length, compression wraps using gradient techniques with Max caregiver assistance to decrease limb volume, to limit infection risk, and to limit lymphedema progression.  Baseline: Dependent Goal status: Initial  LONG TERM GOALS: Target date: 12/06/22  Given this patient's Intake score of 22.06 % on the Lymphedema Life Impact Scale (LLIS), patient will experience a reduction of at least 5 points in her perceived level of functional impairment resulting from lymphedema to improve functional performance and quality of life (QOL). Baseline: 22.06 % Goal status:Initial  2.  Given this patient's Intake score of TBA/100% on the functional outcomes FOTO tool, patient will experience an increase in function of 3 points to improve basic and instrumental ADLs performance, including lymphedema self-care.  Baseline: TBA% Goal status: INITIAL  3.  During Intensive phase CDT Pt will achieve at least 85% compliance with all lymphedema self-care home program components, including  daily skin care, multilayer , gradient compression wraps with daily changes, daily simple self MLD and daily lymphatic pumping therex to achieve optimal clinical outcome and to habituate self care regime for optimal LE self-management over time. Baseline: Dependent Goal status:INITIAL  4.  Pt will achieve at least a 10% volume  reductions bilaterally below the knees to return limb to more typical size and shape, to limit infection risk and  LE progression, to decrease pain, to improve function, and to improve body image and QOL. Baseline: Dependent Goal status:INITIAL  5.  Pt will be able to don and doff appropriate compression garments and devices using assistive devices and extra time and Max CG assistance within 1 week of issue date for optimal lymphedema self-care. Baseline: Dependent Goal status: INITIAL PLAN:  PT FREQUENCY: 2 x/week  PT DURATION: 12  weeks other: and PRN  PLANNED INTERVENTIONS: Therapeutic exercises, Therapeutic activity, Patient/Family education, Self Care, DME instructions, Manual lymph drainage, Compression bandaging, Manual therapy, and skin care during MLD, fit with appropriate custom compression garments and devices, fit with appropriate compression device  which  follows lymphatic pathways and anatomic distribution  PLAN FOR NEXT SESSION:  LLE bandages Pt/family edu for self-bandaging  Loel Dubonnet, MS, OTR/L, CLT-LANA 10/01/22 10:33 AM

## 2022-10-05 ENCOUNTER — Ambulatory Visit: Payer: Medicare Other | Attending: Internal Medicine | Admitting: Occupational Therapy

## 2022-10-05 DIAGNOSIS — I890 Lymphedema, not elsewhere classified: Secondary | ICD-10-CM | POA: Insufficient documentation

## 2022-10-05 NOTE — Therapy (Signed)
OUTPATIENT OCCUPATIONAL THERAPY REEVALUATION AND TREATMENT  BILATERAL LOWER EXTREMITY LYMPHEDEMA  Patient Name: ASHAKI FEBRES MRN: [id number] DOB:March 02, 1951, 72 y.o., female Today's Date: 10/13/2022  END OF SESSION:    OT End of Session - 10/13/22 0936     Visit Number 6    Number of Visits 36    Date for OT Re-Evaluation 12/06/22    OT Start Time 0100    OT Stop Time 0200    OT Time Calculation (min) 60 min    Activity Tolerance Patient tolerated treatment well;No increased pain    Behavior During Therapy WFL for tasks assessed/performed              Past Medical History:  Diagnosis Date   Arthritis    Breast cancer (HCC)    Cataract    Diabetes mellitus without complication (HCC)    Hyperlipidemia    Hypertension    Lymphedema    Vitamin D deficiency    Past Surgical History:  Procedure Laterality Date   ABDOMINAL HYSTERECTOMY     x2   BREAST BIOPSY Right 1990s   neg   BREAST BIOPSY Right 2000s   neg   BREAST BIOPSY Left 11/24/2020   u/s bx 10:30/8 cmfn-"heart" marker   BREAST EXCISIONAL BIOPSY Right    neg   BREAST LUMPECTOMY     BREAST LUMPECTOMY WITH RADIOFREQUENCY TAG IDENTIFICATION Left 12/10/2020   Procedure: BREAST LUMPECTOMY WITH RADIOFREQUENCY TAG IDENTIFICATION;  Surgeon: Campbell Lerner, MD;  Location: [facility] ORS;  Service: General;  Laterality: Left;   DILATION AND CURETTAGE OF UTERUS     EYE SURGERY  2004   cataract surgery and laser surgery   Salpingo-oophorectomy     Patient Active Problem List   Diagnosis Date Noted   Acute cystitis without hematuria 09/28/2022   Urinary tract infection due to ESBL Klebsiella 09/25/2022   AKI (acute kidney injury) (HCC) 09/25/2022   Lymphedema 09/25/2022   Hypoxia 09/25/2022   Cellulitis 08/04/2022   Edema 01/30/2021   Malignant neoplasm of upper-inner quadrant of left breast in female, estrogen receptor positive (HCC) 12/01/2020   Goals of care, counseling/discussion 12/01/2020   Morbid  obesity (HCC) 08/08/2018   BMI 60.0-69.9, adult (HCC) 08/08/2018   Controlled diabetes mellitus type 2 with complications (HCC) 08/08/2018   Hyperlipidemia 08/08/2018   Hypertension associated with diabetes (HCC) 08/08/2018   Arthritis of both knees 08/08/2018   Vitamin D deficiency 08/08/2018   PCP: Olevia Perches, DO  REFERRING PROVIDER: Gwynn Burly, DO  REFERRING DIAG: I89.0  THERAPY DIAG:  Lymphedema, not elsewhere classified  Rationale for Evaluation and Treatment: Rehabilitation  ONSET DATE: 1 yr ago without known precipitating event- by report  SUBJECTIVE:  SUBJECTIVE STATEMENT:Marcia Garciagarcia presents for OT treatment to address BLE lymphedema, L>R. Pt is accompanied by her spouse, [name], who assists her with transfers and with compression bandaging at home between visits. Pt denies LE-related leg pain. She arrives seated in a light duty, power wheelchair w complaints of increased arthritis pain limiting he ability to complete bed transfer today.  Tobacco Use: Low Risk  (10/13/2022)   Patient History    Smoking Tobacco Use: Never    Smokeless Tobacco Use: Never    Passive Exposure: Not on file   PERTINENT HISTORY: HTN, Obesity w BMI 60-69.9, DM, B knee arthritis, Hx L breast cancer, recurrent L leg cellulitis. MRSA test results pending  PAIN: Are you having pain? Denies LE related pain  PRECAUTIONS: Fall and Other: lymphedema precautions, DM. Hx L Br Ca  PRIOR LEVEL OF FUNCTION: Independent with household mobility with device, Requires assistive device for independence, Needs assistance with homemaking, Needs assistance with gait, and Needs assistance with transfers  PATIENT GOALS: ... Get improvement for my condition...., keep lymphedema from getting worse, and reduce infection  risk"  LYMPHEDEMA ASSESSMENTS:  FOTO Functional Outcomes Measure: Initial 33%  Lymphedema Life Impact Scale (LLIS): 22.06%  BLE COMPARATIVE LIMB VOLUMETRICS Initial 09/14/22  LANDMARK RIGHT    R LEG (A-D) 7717.1 ml  R THIGH (E-G) ml  R FULL LIMB (A-G) ml  Limb Volume differential (LVD)  %  Volume change since initial %  Volume change overall V  (Blank rows = not tested)  LANDMARK LEFT   R LEG (A-D) 7752.1 ml  R THIGH (E-G) ml  R FULL LIMB (A-G) ml  Limb Volume differential (LVD)  Leg LVD measures 0.5%, L>R   Volume change since initial %  Volume change overall %  (Blank rows = not tested)   TODAY'S TREATMENT:                                                                                                                                         LLE MLD w/ simultaneous skin care - feet elevated somewhat on footstool LLE multilayer compression wraps- as established  PATIENT EDUCATION:  Continued Pt/ CG edu for lymphedema self care home program throughout session. Topics include outcome of comparative limb volumetrics- starting limb volume differentials (LVDs), technology and gradient techniques used for short stretch, multilayer compression wrapping, simple self-MLD, therapeutic lymphatic pumping exercises, skin/nail care, LE precautions,. compression garment recommendations and specifications, wear and care schedule and compression garment donning / doffing w assistive devices. Discussed progress towards all OT goals since commencing CDT. All questions answered to the Pt's satisfaction. Good return. Person educated: Patient and Spouse Education method: Explanation, Demonstration, and Handouts Education comprehension: verbalized understanding, returned demonstration, verbal cues required, and needs further education  Lymphedema HOME PROGRAM:Complete Decongestive Therapy (CDT) Self-Management Phase Daily simple self-MLD Daily BLE skin care and inspection Daily lymphatic pumping  there ex Appropriate  daily compression- knee length, will require max caregiver A to  and doff Leg elevation when seated  ASSESSMENT: CLINICAL IMPRESSION: L leg volume fluctuating upwards today compared with last visit. Leg and foot are densely congested. Dorsal foot is deeply pitting.Skin at distal leg and ankle continues to present with flaky hyperkeratosis. Minimal, if any, progress towards volume reduction observed to date. Provided MLD as established without report of increased pain. Applied multilayer gradient compression wraps below the kneel. Cont as per POC.  OBJECTIVE IMPAIRMENTS: Abnormal gait, decreased activity tolerance, decreased balance, decreased endurance, decreased knowledge of condition, decreased knowledge of use of DME, decreased mobility, difficulty walking, decreased ROM, decreased strength, increased edema, impaired perceived functional ability, postural dysfunction, obesity, pain, and chronic progressive limb swelling with elevated infection risk and risk of non-healing wounds .   ACTIVITY LIMITATIONS: carrying, lifting, bending, sitting, standing, squatting, sleeping, stairs, transfers, bed mobility, bathing, toileting, dressing, hygiene/grooming, and caring for others  PARTICIPATION LIMITATIONS: meal prep, cleaning, laundry, driving, shopping, community activity, occupation, yard work, and church  PERSONAL FACTORS: 3+ comorbidities: HTN, Stage III Obesity, B knee arthritis pain and inflammation and impaired AROM 2/2 body habitus  are also affecting patient's functional outcome.   REHAB POTENTIAL:  Fair with daily CG assistance throughout Intensive Phase CDT.  Poor without daily CG assistance with all LE self care home program components  GOALS: Goals reviewed with patient? Yes  SHORT TERM GOALS: Target date: 4th OT Rx visit   Pt will demonstrate understanding of lymphedema precautions and prevention strategies with modified independence using a printed reference to  identify at least 5 precautions and discussing how s/he may implement them into daily life to reduce risk of progression with modified assistance ( printed reference). Baseline: Max A Goal status: Initial  2.  Pt will be able to apply multilayer, thigh length, compression wraps using gradient techniques with Max caregiver assistance to decrease limb volume, to limit infection risk, and to limit lymphedema progression.  Baseline: Dependent Goal status: Initial  LONG TERM GOALS: Target date: 12/06/22  Given this patient's Intake score of 22.06 % on the Lymphedema Life Impact Scale (LLIS), patient will experience a reduction of at least 5 points in her perceived level of functional impairment resulting from lymphedema to improve functional performance and quality of life (QOL). Baseline: 22.06 % Goal status:Initial  2.  Given this patient's Intake score of TBA/100% on the functional outcomes FOTO tool, patient will experience an increase in function of 3 points to improve basic and instrumental ADLs performance, including lymphedema self-care.  Baseline: TBA% Goal status: INITIAL  3.  During Intensive phase CDT Pt will achieve at least 85% compliance with all lymphedema self-care home program components, including  daily skin care, multilayer , gradient compression wraps with daily changes, daily simple self MLD and daily lymphatic pumping therex to achieve optimal clinical outcome and to habituate self care regime for optimal LE self-management over time. Baseline: Dependent Goal status:INITIAL  4.  Pt will achieve at least a 10% volume reductions bilaterally below the knees to return limb to more typical size and shape, to limit infection risk and LE progression, to decrease pain, to improve function, and to improve body image and QOL. Baseline: Dependent Goal status:INITIAL  5.  Pt will be able to don and doff appropriate compression garments and devices using assistive devices and extra  time and Max CG assistance within 1 week of issue date for optimal lymphedema self-care. Baseline: Dependent Goal status: INITIAL PLAN:  PT FREQUENCY: 2 x/week  PT DURATION: 12  weeks other: and PRN  PLANNED INTERVENTIONS: Therapeutic exercises, Therapeutic activity, Patient/Family education, Self Care, DME instructions, Manual lymph drainage, Compression bandaging, Manual therapy, and skin care during MLD, fit with appropriate custom compression garments and devices, fit with appropriate compression device  which  follows lymphatic pathways and anatomic distribution  PLAN FOR NEXT SESSION:  LLE bandages Pt/family edu for self-bandaging  Loel Dubonnet, MS, OTR/L, CLT-LANA 10/13/22 9:35 AM

## 2022-10-06 ENCOUNTER — Encounter: Payer: Self-pay | Admitting: Physician Assistant

## 2022-10-06 ENCOUNTER — Ambulatory Visit (INDEPENDENT_AMBULATORY_CARE_PROVIDER_SITE_OTHER): Payer: Medicare Other | Admitting: Physician Assistant

## 2022-10-06 VITALS — BP 107/68 | HR 78

## 2022-10-06 DIAGNOSIS — Z09 Encounter for follow-up examination after completed treatment for conditions other than malignant neoplasm: Secondary | ICD-10-CM | POA: Diagnosis not present

## 2022-10-06 DIAGNOSIS — R300 Dysuria: Secondary | ICD-10-CM

## 2022-10-06 DIAGNOSIS — N200 Calculus of kidney: Secondary | ICD-10-CM | POA: Diagnosis not present

## 2022-10-06 LAB — URINALYSIS, ROUTINE W REFLEX MICROSCOPIC
Bilirubin, UA: NEGATIVE
Ketones, UA: NEGATIVE
Nitrite, UA: POSITIVE — AB
Specific Gravity, UA: >1.03 — ABNORMAL HIGH (ref 1.005–1.030)
Urobilinogen, Ur: 0.2 mg/dL (ref 0.2–1.0)
pH, UA: 5.5 (ref 5.0–7.5)

## 2022-10-06 LAB — MICROSCOPIC EXAMINATION

## 2022-10-06 MED ORDER — TRAMADOL HCL 50 MG PO TABS
50.0000 mg | ORAL_TABLET | Freq: Three times a day (TID) | ORAL | 0 refills | Status: AC | PRN
Start: 2022-10-06 — End: 2022-10-09

## 2022-10-06 NOTE — Progress Notes (Signed)
Your urine was concerning for signs of UTI again. Given you were recently treated for drug-resistant UTI I recommend that we wait for the urine culture results so we know what antibiotic to send in  to best treat it. This should be back in the next few days and we will keep you updated once the results are back.

## 2022-10-06 NOTE — Progress Notes (Signed)
Acute Office Visit   Patient: Sharon Ware   DOB: 1951-01-27   72 y.o. Female  MRN: [id number] Visit Date: 10/06/2022  Today's healthcare provider: Oswaldo Conroy Yaneli Keithley, PA-C  Introduced myself to the patient as a Secondary school teacher and provided education on APPs in clinical practice.    Chief Complaint  Patient presents with   Hospitalization Follow-up   Hematuria    Patient says she was hospitalized from 09/25/22 until 09/30/22 treating a long term UTI, and was prescribed an antibiotic as she was resistant to all the treatment. Patient says they also informed her that she had kidney stones, and thinks she passed one Monday night. Patient says she think she may be having another UTI.    Back Pain   Dysuria   Subjective    HPI HPI     Hematuria    Additional comments: Patient says she was hospitalized from 09/25/22 until 09/30/22 treating a long term UTI, and was prescribed an antibiotic as she was resistant to all the treatment. Patient says they also informed her that she had kidney stones, and thinks she passed one Monday night. Patient says she think she may be having another UTI.       Last edited by Malen Gauze, CMA on 10/06/2022 10:11 AM.        She is concerned that she has a UTI She states Monday night she started having back pain and burning with urination  She thinks she may have passed a kidney stone on Monday  She reports she felt fine yesterday but this AM she started having back pain and burning again She reports the back pain is intermittent  She denies gross hematuria  She has been taking Tylenol and tizanidine for the back pain but this has not been helping much     Transition of Care Hospital Follow up.   Hospital/Facility: [facility] D/C Physician: Marrion Coy, MD  D/C Date: 09/29/22  Records Requested:  Records Received:  Records Reviewed: Hospital DC summary   Diagnoses on Discharge: Urinary Tract infection due to ESBL Klebsiella   Date of interactive  Contact within 48 hours of discharge: 09/30/22 Contact was through: phone  Date of 7 day or 14 day face-to-face visit:    within 7 days  Outpatient Encounter Medications as of 10/06/2022  Medication Sig   acetaminophen (TYLENOL) 650 MG CR tablet Take 650 mg by mouth every 8 (eight) hours as needed for pain.   aspirin 81 MG tablet Take 81 mg by mouth daily.   Calcium Citrate-Vitamin D (CALCIUM CITRATE + D3 PO) Take 1,200 mg by mouth daily.   Cholecalciferol (VITAMIN D3) 125 MCG (5000 UT) TABS Take 5,000 Units by mouth daily.   dapagliflozin propanediol (FARXIGA) 10 MG TABS tablet Take 1 tablet (10 mg total) by mouth daily before breakfast.   Glucosamine HCl 1500 MG TABS Take 1,500 mg by mouth in the morning and at bedtime.   letrozole (FEMARA) 2.5 MG tablet Take 1 tablet (2.5 mg total) by mouth daily.   losartan-hydrochlorothiazide (HYZAAR) 50-12.5 MG tablet Take 1 tablet by mouth daily.   lovastatin (MEVACOR) 20 MG tablet TAKE 1 TABLET BY MOUTH EVERYDAY AT BEDTIME (Patient taking differently: Take 20 mg by mouth at bedtime. TAKE 1 TABLET BY MOUTH EVERYDAY AT BEDTIME)   Menthol, Topical Analgesic, (ICY HOT EX) Apply 1 application topically daily as needed (pain).   metFORMIN (GLUCOPHAGE-XR) 500 MG 24 hr tablet Take 2 tablets (  1,000 mg total) by mouth 2 (two) times daily with a meal.   Omega-3 Fatty Acids (FISH OIL ULTRA) 1400 MG CAPS Take 1,400 mg by mouth daily.   spironolactone (ALDACTONE) 50 MG tablet Take 1 tablet (50 mg total) by mouth daily.   traMADol (ULTRAM) 50 MG tablet Take 1 tablet (50 mg total) by mouth every 8 (eight) hours as needed for up to 3 days.   No facility-administered encounter medications on file as of 10/06/2022.    Diagnostic Tests Reviewed/Disposition: Labs, CT stone study, CT chest   Consults: ID  Discharge Instructions: Follow up with PCP in one week   Disease/illness Education: NA  Home Health/Community Services Discussions/Referrals: none  Establishment or  re-establishment of referral orders for community resources: may need referral to Urology for kidney stones   Discussion with other health care providers: none   Assessment and Support of treatment regimen adherence: NA  Appointments Coordinated with: none  Education for self-management, independent living, and ADLs:    Medications: Outpatient Medications Prior to Visit  Medication Sig   acetaminophen (TYLENOL) 650 MG CR tablet Take 650 mg by mouth every 8 (eight) hours as needed for pain.   aspirin 81 MG tablet Take 81 mg by mouth daily.   Calcium Citrate-Vitamin D (CALCIUM CITRATE + D3 PO) Take 1,200 mg by mouth daily.   Cholecalciferol (VITAMIN D3) 125 MCG (5000 UT) TABS Take 5,000 Units by mouth daily.   dapagliflozin propanediol (FARXIGA) 10 MG TABS tablet Take 1 tablet (10 mg total) by mouth daily before breakfast.   Glucosamine HCl 1500 MG TABS Take 1,500 mg by mouth in the morning and at bedtime.   letrozole (FEMARA) 2.5 MG tablet Take 1 tablet (2.5 mg total) by mouth daily.   losartan-hydrochlorothiazide (HYZAAR) 50-12.5 MG tablet Take 1 tablet by mouth daily.   lovastatin (MEVACOR) 20 MG tablet TAKE 1 TABLET BY MOUTH EVERYDAY AT BEDTIME (Patient taking differently: Take 20 mg by mouth at bedtime. TAKE 1 TABLET BY MOUTH EVERYDAY AT BEDTIME)   Menthol, Topical Analgesic, (ICY HOT EX) Apply 1 application topically daily as needed (pain).   metFORMIN (GLUCOPHAGE-XR) 500 MG 24 hr tablet Take 2 tablets (1,000 mg total) by mouth 2 (two) times daily with a meal.   Omega-3 Fatty Acids (FISH OIL ULTRA) 1400 MG CAPS Take 1,400 mg by mouth daily.   spironolactone (ALDACTONE) 50 MG tablet Take 1 tablet (50 mg total) by mouth daily.   No facility-administered medications prior to visit.    Review of Systems  Genitourinary:  Positive for dysuria and flank pain.       Objective    BP 107/68   Pulse 78   SpO2 90%    Physical Exam Vitals reviewed.  Constitutional:      General:  She is awake.     Appearance: Normal appearance. She is well-developed and well-groomed. She is morbidly obese.  HENT:     Head: Normocephalic and atraumatic.  Pulmonary:     Effort: Pulmonary effort is normal.  Musculoskeletal:     Cervical back: Normal range of motion.     Right lower leg: Edema present.     Left lower leg: Edema present.  Neurological:     General: No focal deficit present.     Mental Status: She is alert and oriented to person, place, and time.  Psychiatric:        Mood and Affect: Mood normal.        Behavior: Behavior normal.  Behavior is cooperative.        Thought Content: Thought content normal.        Judgment: Judgment normal.       Results for orders placed or performed in visit on 10/06/22  Microscopic Examination   Urine  Result Value Ref Range   WBC, UA 11-30 (A) 0 - 5 /hpf   RBC, Urine 3-10 (A) 0 - 2 /hpf   Epithelial Cells (non renal) 0-10 0 - 10 /hpf   Bacteria, UA Many (A) None seen/Few  Comp Met (CMET)  Result Value Ref Range   Glucose 140 (H) 70 - 99 mg/dL   BUN 27 8 - 27 mg/dL   Creatinine, Ser 0.98 0.57 - 1.00 mg/dL   eGFR 71 >11 BJ/YNW/2.95   BUN/Creatinine Ratio 31 (H) 12 - 28   Sodium 138 134 - 144 mmol/L   Potassium 4.8 3.5 - 5.2 mmol/L   Chloride 96 96 - 106 mmol/L   CO2 24 20 - 29 mmol/L   Calcium 10.6 (H) 8.7 - 10.3 mg/dL   Total Protein 7.2 6.0 - 8.5 g/dL   Albumin 4.0 3.8 - 4.8 g/dL   Globulin, Total 3.2 1.5 - 4.5 g/dL   Bilirubin Total 0.4 0.0 - 1.2 mg/dL   Alkaline Phosphatase 69 44 - 121 IU/L   AST 20 0 - 40 IU/L   ALT 21 0 - 32 IU/L  CBC w/Diff  Result Value Ref Range   WBC 9.8 3.4 - 10.8 x10E3/uL   RBC 5.05 3.77 - 5.28 x10E6/uL   Hemoglobin 14.8 11.1 - 15.9 g/dL   Hematocrit 62.1 30.8 - 46.6 %   MCV 91 79 - 97 fL   MCH 29.3 26.6 - 33.0 pg   MCHC 32.1 31.5 - 35.7 g/dL   RDW 65.7 84.6 - 96.2 %   Platelets 257 150 - 450 x10E3/uL   Neutrophils 69 Not Estab. %   Lymphs 21 Not Estab. %   Monocytes 6 Not  Estab. %   Eos 3 Not Estab. %   Basos 1 Not Estab. %   Neutrophils Absolute 6.9 1.4 - 7.0 x10E3/uL   Lymphocytes Absolute 2.0 0.7 - 3.1 x10E3/uL   Monocytes Absolute 0.6 0.1 - 0.9 x10E3/uL   EOS (ABSOLUTE) 0.2 0.0 - 0.4 x10E3/uL   Basophils Absolute 0.1 0.0 - 0.2 x10E3/uL   Immature Granulocytes 0 Not Estab. %   Immature Grans (Abs) 0.0 0.0 - 0.1 x10E3/uL  Urinalysis, Routine w reflex microscopic  Result Value Ref Range   Specific Gravity, UA >1.030 (H) 1.005 - 1.030   pH, UA 5.5 5.0 - 7.5   Color, UA Yellow Yellow   Appearance Ur Cloudy (A) Clear   Leukocytes,UA 1+ (A) Negative   Protein,UA 2+ (A) Negative/Trace   Glucose, UA 2+ (A) Negative   Ketones, UA Negative Negative   RBC, UA 2+ (A) Negative   Bilirubin, UA Negative Negative   Urobilinogen, Ur 0.2 0.2 - 1.0 mg/dL   Nitrite, UA Positive (A) Negative   Microscopic Examination See below:     Assessment & Plan      No follow-ups on file.      Problem List Items Addressed This Visit   None Visit Diagnoses     Hospital discharge follow-up    -  Primary Follow up for hospitalization for UTI due to ESBL klebsiella  Reviewed dc notes, labs, and imaging Patient reports dysuria and back pain today and UA is consistent with UTI  Imaging  revealed several kidney stones- several of which are too large for patient to pass without intervention- will place referral to Urology and provide tramadol to assist with pain management given her allergies  Urine culture to dictate further management of potential UTI given resistance pattern of previous infection Will also get CBC and CMP for monitoring Results to dictate further management- may need repeat CT abdomen/pelvis for stone surveillance but will defer to Urology    Nephrolithiasis       Relevant Medications   traMADol (ULTRAM) 50 MG tablet   Other Relevant Orders   Ambulatory referral to Urology   Urine Culture   Dysuria       Relevant Medications   traMADol (ULTRAM) 50  MG tablet   Other Relevant Orders   Comp Met (CMET) (Completed)   CBC w/Diff (Completed)   Urinalysis, Routine w reflex microscopic (Completed)   Urine Culture        No follow-ups on file.   I, Areebah Meinders E Danyl Deems, PA-C, have reviewed all documentation for this visit. The documentation on 10/08/22 for the exam, diagnosis, procedures, and orders are all accurate and complete.   Jacquelin Hawking, MHS, PA-C [facility], [facility]

## 2022-10-07 LAB — COMPREHENSIVE METABOLIC PANEL
ALT: 21 IU/L (ref 0–32)
AST: 20 IU/L (ref 0–40)
Albumin: 4 g/dL (ref 3.8–4.8)
Alkaline Phosphatase: 69 IU/L (ref 44–121)
BUN/Creatinine Ratio: 31 — ABNORMAL HIGH (ref 12–28)
BUN: 27 mg/dL (ref 8–27)
Bilirubin Total: 0.4 mg/dL (ref 0.0–1.2)
CO2: 24 mmol/L (ref 20–29)
Calcium: 10.6 mg/dL — ABNORMAL HIGH (ref 8.7–10.3)
Chloride: 96 mmol/L (ref 96–106)
Creatinine, Ser: 0.86 mg/dL (ref 0.57–1.00)
Globulin, Total: 3.2 g/dL (ref 1.5–4.5)
Glucose: 140 mg/dL — ABNORMAL HIGH (ref 70–99)
Potassium: 4.8 mmol/L (ref 3.5–5.2)
Sodium: 138 mmol/L (ref 134–144)
Total Protein: 7.2 g/dL (ref 6.0–8.5)
eGFR: 71 mL/min/{1.73_m2} (ref >59)

## 2022-10-07 LAB — CBC WITH DIFFERENTIAL/PLATELET
Basophils Absolute: 0.1 10*3/uL (ref 0.0–0.2)
Basos: 1 %
EOS (ABSOLUTE): 0.2 10*3/uL (ref 0.0–0.4)
Eos: 3 %
Hematocrit: 46.1 % (ref 34.0–46.6)
Hemoglobin: 14.8 g/dL (ref 11.1–15.9)
Immature Grans (Abs): 0 10*3/uL (ref 0.0–0.1)
Immature Granulocytes: 0 %
Lymphocytes Absolute: 2 10*3/uL (ref 0.7–3.1)
Lymphs: 21 %
MCH: 29.3 pg (ref 26.6–33.0)
MCHC: 32.1 g/dL (ref 31.5–35.7)
MCV: 91 fL (ref 79–97)
Monocytes Absolute: 0.6 10*3/uL (ref 0.1–0.9)
Monocytes: 6 %
Neutrophils Absolute: 6.9 10*3/uL (ref 1.4–7.0)
Neutrophils: 69 %
Platelets: 257 10*3/uL (ref 150–450)
RBC: 5.05 x10E6/uL (ref 3.77–5.28)
RDW: 13.4 % (ref 11.7–15.4)
WBC: 9.8 10*3/uL (ref 3.4–10.8)

## 2022-10-08 ENCOUNTER — Telehealth: Payer: Self-pay | Admitting: Family Medicine

## 2022-10-08 ENCOUNTER — Ambulatory Visit: Payer: Self-pay | Admitting: *Deleted

## 2022-10-08 LAB — URINE CULTURE

## 2022-10-08 NOTE — Telephone Encounter (Signed)
Pt is calling in because the medication she was prescribed ultram 50 mg and pt is unable to take it due to allergies to components of the medication. Pt says she's been calling about this issue since 10/06/22 and it hasn't been resolved and she is having pain and does not want to carry out the weekend without another pain medication.  Please follow up with pt.

## 2022-10-08 NOTE — Progress Notes (Signed)
Your labs are back Your calcium is a bit elevated. We will need to keep an eye on this. Please decrease your dietary intake of Calcium (milk, cheeses, yogurts) to see if this improves it. Your blood count did not show signs of anemia but your White blood cells were a bit higher than previous. This may be due to a repeat UTI which we are investigating We are still awaiting the culture results and will keep you posted once that is back .

## 2022-10-08 NOTE — Telephone Encounter (Signed)
Summary: med ?   Pt called in states was told by pharmacy, she can't take morphine or codeine because she is allergic to these and says Dr Laural Benes was supposed to prescribe her something else instead for pain, but she hasn't heard back about it. She didn't mention any pain at this time though.       Chief Complaint: medication question and requesting urine lab results Symptoms:  back pain  comes and goes . Worse with mobility level 6 pain now.  Frequency: na  Pertinent Negatives: Patient denies na  Disposition: [] ED /[] Urgent Care (no appt availability in office) / [] Appointment(In office/virtual)/ []  [place] [facility]/ [] Home Care/ [] Refused Recommended Disposition /[] [place] [facility]/ [x]  Follow-up with PCP Additional Notes:   Patient reports pharmacy would not fill ultram 50 mg due to her allergies / intolerance to morphine and codeine from 10/06/22. Also requesting results from urine cx. 10/06/22. No results noted at this time. Please advise.  Patient requesting call back before weekend     Reason for Disposition  [1] Caller has URGENT medicine question about med that PCP or specialist prescribed AND [2] triager unable to answer question  Answer Assessment - Initial Assessment Questions 1. NAME of MEDICINE: "What medicine(s) are you calling about?"     Ultram 50 mg  2. QUESTION: "What is your question?" (e.g., double dose of medicine, side effect)     Per patient pharmacy reports she does not need to take ultram due to allergy to morphine and codeine. Requesting PCP prescribed alternative medication  3. PRESCRIBER: "Who prescribed the medicine?" Reason: if prescribed by specialist, call should be referred to that group.     E. Mecum, PA 4. SYMPTOMS: "Do you have any symptoms?" If Yes, ask: "What symptoms are you having?"  "How bad are the symptoms (e.g., mild, moderate, severe)     Continues with back pain  5. PREGNANCY:  "Is there any chance that you are pregnant?" "When  was your last menstrual period?"     Na  Protocols used: Medication Question Call-A-AH

## 2022-10-11 ENCOUNTER — Telehealth: Payer: Self-pay | Admitting: Family Medicine

## 2022-10-11 NOTE — Telephone Encounter (Signed)
At this point there are no other pain medications that I am comfortable sending in. If she is not comfortable taking the Tramadol she does not have to but given her allergies, the options are limited. At this time she can take Tylenol or she can go to the ED for pain management if her pain is more severe. Her urine culture is still not finalized but it did show bacteria that is likely going to be multi-drug resistant. I have reached out to her infectious disease provider for them to coordinate the medications that will be required to treat this so she should be hearing from them soon.

## 2022-10-11 NOTE — Telephone Encounter (Signed)
See other phone encounter.  

## 2022-10-11 NOTE — Telephone Encounter (Signed)
Routing to provider to advise.  

## 2022-10-11 NOTE — Telephone Encounter (Unsigned)
Copied from CRM [id number]. Topic: General - Other >> Oct 11, 2022  3:33 PM Dominique A wrote: Reason for CRM: Pt is calling in upset. Please view NT telephone encounter from 10/08/2022. Pt has been explained to pt that the Tramadol that has been prescribed will not counter act to the pt being allergic to morphine. Pt states that the Pharmacists at her pharmacy advised her not to take this medication because it will counter act since she is allergic to morphine and she is going by what the Pharmacists advised her. Pt states that she trust the Pharmacists because her Pharmacists has studied medicine. Pt is wanting a call back today. Pt states that she has been waiting since last Wednesday for a different pain medication while she is waiting for her culture to come back.

## 2022-10-11 NOTE — Telephone Encounter (Signed)
Can you call the pharmacy and see if they will fill the script or what the hang up is? If they won't fill it, there aren't many other options for pain control left open to [place].

## 2022-10-11 NOTE — Telephone Encounter (Signed)
Spoke with [facility] pharmacy to check the status of patient's prescription for Tramadol and was informed it was ready for pick up and never picked up for the patient. Pharmacy technician said they can process it one more time for the patient and it will be ready tomorrow after 11 am and they will also notify the patient when ready for pick up.

## 2022-10-12 ENCOUNTER — Encounter: Payer: Self-pay | Admitting: Family Medicine

## 2022-10-12 LAB — URINE CULTURE

## 2022-10-13 ENCOUNTER — Encounter: Payer: Self-pay | Admitting: Occupational Therapy

## 2022-10-13 NOTE — Telephone Encounter (Signed)
Patient was made aware of Erin's recommendation via MyChart. Advised patient to give our office a call if she has any questions or concerns.

## 2022-10-13 NOTE — Progress Notes (Signed)
Your urine culture results have returned and it appears that you have several different bacteria potentially causing a UTI.  Given your recent hospitalization for UTI with a multidrug-resistant bacteria I have requested assistance with treating you appropriately from infectious disease.  I would like to place a referral for you to go to an infectious disease specialist in [place] if you are are willing to see them. I think we will get a better outcome if you are consulted by the providers in [place] at this time. Let us know how you would like to proceed.

## 2022-10-14 ENCOUNTER — Telehealth: Payer: Self-pay | Admitting: Physician Assistant

## 2022-10-14 ENCOUNTER — Ambulatory Visit: Payer: Medicare Other | Admitting: Occupational Therapy

## 2022-10-14 DIAGNOSIS — I890 Lymphedema, not elsewhere classified: Secondary | ICD-10-CM

## 2022-10-14 NOTE — Therapy (Signed)
OUTPATIENT OCCUPATIONAL THERAPY REEVALUATION AND TREATMENT  BILATERAL LOWER EXTREMITY LYMPHEDEMA  Patient Name: Sharon Ware MRN: [id number] DOB:1951-01-08, 72 y.o., female Today's Date: 10/14/2022  END OF SESSION:   OT End of Session - 10/14/22 1117     Visit Number 7    Number of Visits 36    Date for OT Re-Evaluation 12/06/22    OT Start Time 1104    OT Stop Time 1214    OT Time Calculation (min) 70 min    Activity Tolerance Patient tolerated treatment well;No increased pain    Behavior During Therapy WFL for tasks assessed/performed               Past Medical History:  Diagnosis Date   Arthritis    Breast cancer (HCC)    Cataract    Diabetes mellitus without complication (HCC)    Hyperlipidemia    Hypertension    Lymphedema    Vitamin D deficiency    Past Surgical History:  Procedure Laterality Date   ABDOMINAL HYSTERECTOMY     x2   BREAST BIOPSY Right 1990s   neg   BREAST BIOPSY Right 2000s   neg   BREAST BIOPSY Left 11/24/2020   u/s bx 10:30/8 cmfn-"heart" marker   BREAST EXCISIONAL BIOPSY Right    neg   BREAST LUMPECTOMY     BREAST LUMPECTOMY WITH RADIOFREQUENCY TAG IDENTIFICATION Left 12/10/2020   Procedure: BREAST LUMPECTOMY WITH RADIOFREQUENCY TAG IDENTIFICATION;  Surgeon: Campbell Lerner, MD;  Location: [facility] ORS;  Service: General;  Laterality: Left;   DILATION AND CURETTAGE OF UTERUS     EYE SURGERY  2004   cataract surgery and laser surgery   Salpingo-oophorectomy     Patient Active Problem List   Diagnosis Date Noted   Acute cystitis without hematuria 09/28/2022   Urinary tract infection due to ESBL Klebsiella 09/25/2022   AKI (acute kidney injury) (HCC) 09/25/2022   Lymphedema 09/25/2022   Hypoxia 09/25/2022   Cellulitis 08/04/2022   Edema 01/30/2021   Malignant neoplasm of upper-inner quadrant of left breast in female, estrogen receptor positive (HCC) 12/01/2020   Goals of care, counseling/discussion 12/01/2020   Morbid  obesity (HCC) 08/08/2018   BMI 60.0-69.9, adult (HCC) 08/08/2018   Controlled diabetes mellitus type 2 with complications (HCC) 08/08/2018   Hyperlipidemia 08/08/2018   Hypertension associated with diabetes (HCC) 08/08/2018   Arthritis of both knees 08/08/2018   Vitamin D deficiency 08/08/2018   PCP: Olevia Perches, DO  REFERRING PROVIDER: Gwynn Burly, DO  REFERRING DIAG: I89.0  THERAPY DIAG:  Lymphedema, not elsewhere classified [I89.0]  Rationale for Evaluation and Treatment: Rehabilitation  ONSET DATE: 1 yr ago without known precipitating event- by report  SUBJECTIVE:  SUBJECTIVE STATEMENT: Sharon Ware presents for OT treatment to address BLE lymphedema, L>R. Pt is accompanied by her spouse, [name], who assists her with transfers and with compression bandaging at home between visits. Pt denies LE-related leg pain. She arrives seated in a transport wheelchair w complaints of kidney stone w P! 4/10 . Pt completed SPT with Max A from spouse.   Tobacco Use: Low Risk  (10/13/2022)   Patient History    Smoking Tobacco Use: Never    Smokeless Tobacco Use: Never    Passive Exposure: Not on file   PERTINENT HISTORY: HTN, Obesity w BMI 60-69.9, DM, B knee arthritis, Hx L breast cancer, recurrent L leg cellulitis. MRSA test results pending  PAIN: Are you having pain? Denies LE related pain  PRECAUTIONS: Fall and Other: lymphedema precautions, DM. Hx L Br Ca  PRIOR LEVEL OF FUNCTION: Independent with household mobility with device, Requires assistive device for independence, Needs assistance with homemaking, Needs assistance with gait, and Needs assistance with transfers  PATIENT GOALS: ... Get improvement for my condition...., keep lymphedema from getting worse, and reduce infection risk"  LYMPHEDEMA  ASSESSMENTS:  FOTO Functional Outcomes Measure: Initial 33%  Lymphedema Life Impact Scale (LLIS): 22.06%  BLE COMPARATIVE LIMB VOLUMETRICS Initial 09/14/22  LANDMARK RIGHT    R LEG (A-D) 7717.1 ml  R THIGH (E-G) ml  R FULL LIMB (A-G) ml  Limb Volume differential (LVD)  %  Volume change since initial %  Volume change overall V  (Blank rows = not tested)  LANDMARK LEFT   R LEG (A-D) 7752.1 ml  R THIGH (E-G) ml  R FULL LIMB (A-G) ml  Limb Volume differential (LVD)  Leg LVD measures 0.5%, L>R   Volume change since initial %  Volume change overall %  (Blank rows = not tested)   TODAY'S TREATMENT:                                                                                                                                         LLE MLD w/ simultaneous skin care  at level of Rx bed Knee length LLE multilayer compression wraps- as established  PATIENT EDUCATION:  Continued Pt/ CG edu for lymphedema self care home program throughout session. Topics include outcome of comparative limb volumetrics- starting limb volume differentials (LVDs), technology and gradient techniques used for short stretch, multilayer compression wrapping, simple self-MLD, therapeutic lymphatic pumping exercises, skin/nail care, LE precautions,. compression garment recommendations and specifications, wear and care schedule and compression garment donning / doffing w assistive devices. Discussed progress towards all OT goals since commencing CDT. All questions answered to the Pt's satisfaction. Good return. Person educated: Patient and Spouse Education method: Explanation, Demonstration, and Handouts Education comprehension: verbalized understanding, returned demonstration, verbal cues required, and needs further education  Lymphedema HOME PROGRAM:Complete Decongestive Therapy (CDT) Self-Management Phase Daily simple self-MLD Daily BLE skin care and inspection Daily lymphatic  pumping there ex Appropriate daily  compression- knee length, will require max caregiver A to  and doff Leg elevation when seated  ASSESSMENT: CLINICAL IMPRESSION: L leg volume fluctuating downwards today compared with last visit.L leg tissue density is palpably softer and less congested. Dorsal foot remains swollen, but pitting is resolved and some skin laxity is noted. Provided MLD as established without report of increased pain. Applied multilayer gradient compression wraps below the knee. Cont as per POC.  OBJECTIVE IMPAIRMENTS: Abnormal gait, decreased activity tolerance, decreased balance, decreased endurance, decreased knowledge of condition, decreased knowledge of use of DME, decreased mobility, difficulty walking, decreased ROM, decreased strength, increased edema, impaired perceived functional ability, postural dysfunction, obesity, pain, and chronic progressive limb swelling with elevated infection risk and risk of non-healing wounds .   ACTIVITY LIMITATIONS: carrying, lifting, bending, sitting, standing, squatting, sleeping, stairs, transfers, bed mobility, bathing, toileting, dressing, hygiene/grooming, and caring for others  PARTICIPATION LIMITATIONS: meal prep, cleaning, laundry, driving, shopping, community activity, occupation, yard work, and church  PERSONAL FACTORS: 3+ comorbidities: HTN, Stage III Obesity, B knee arthritis pain and inflammation and impaired AROM 2/2 body habitus  are also affecting patient's functional outcome.   REHAB POTENTIAL:  Fair with daily CG assistance throughout Intensive Phase CDT.  Poor without daily CG assistance with all LE self care home program components  GOALS: Goals reviewed with patient? Yes  SHORT TERM GOALS: Target date: 4th OT Rx visit   Pt will demonstrate understanding of lymphedema precautions and prevention strategies with modified independence using a printed reference to identify at least 5 precautions and discussing how s/he may implement them into daily life to  reduce risk of progression with modified assistance ( printed reference). Baseline: Max A Goal status: Initial  2.  Pt will be able to apply multilayer, thigh length, compression wraps using gradient techniques with Max caregiver assistance to decrease limb volume, to limit infection risk, and to limit lymphedema progression.  Baseline: Dependent Goal status: Initial  LONG TERM GOALS: Target date: 12/06/22  Given this patient's Intake score of 22.06 % on the Lymphedema Life Impact Scale (LLIS), patient will experience a reduction of at least 5 points in her perceived level of functional impairment resulting from lymphedema to improve functional performance and quality of life (QOL). Baseline: 22.06 % Goal status:Initial  2.  Given this patient's Intake score of TBA/100% on the functional outcomes FOTO tool, patient will experience an increase in function of 3 points to improve basic and instrumental ADLs performance, including lymphedema self-care.  Baseline: TBA% Goal status: INITIAL  3.  During Intensive phase CDT Pt will achieve at least 85% compliance with all lymphedema self-care home program components, including  daily skin care, multilayer , gradient compression wraps with daily changes, daily simple self MLD and daily lymphatic pumping therex to achieve optimal clinical outcome and to habituate self care regime for optimal LE self-management over time. Baseline: Dependent Goal status:INITIAL  4.  Pt will achieve at least a 10% volume reductions bilaterally below the knees to return limb to more typical size and shape, to limit infection risk and LE progression, to decrease pain, to improve function, and to improve body image and QOL. Baseline: Dependent Goal status:INITIAL  5.  Pt will be able to don and doff appropriate compression garments and devices using assistive devices and extra time and Max CG assistance within 1 week of issue date for optimal lymphedema  self-care. Baseline: Dependent Goal status: INITIAL PLAN:  PT FREQUENCY: 2 x/week  PT DURATION: 12  weeks other: and PRN  PLANNED INTERVENTIONS: Therapeutic exercises, Therapeutic activity, Patient/Family education, Self Care, DME instructions, Manual lymph drainage, Compression bandaging, Manual therapy, and skin care during MLD, fit with appropriate custom compression garments and devices, fit with appropriate compression device  which  follows lymphatic pathways and anatomic distribution  PLAN FOR NEXT SESSION:  LLE bandages Pt/family edu for self-bandaging  Loel Dubonnet, MS, OTR/L, CLT-LANA 10/14/22 12:19 PM

## 2022-10-14 NOTE — Telephone Encounter (Signed)
Spoke with patient regarding my concerns for her UTI symptoms and chronic colonization of bacteria. She reports she is already scheduled with Urology for her stones.Recommended that she keeps this apt due to size of her stones.  She states she is still having some symptoms but they aren't as bad.  She is amenable to going to [facility] ID office for evaluation of UTI symptoms and potential outpatient management. She is agreeable for me to reach out to [facility] provider for collaboration. Discussed that I would reach out to them and they would contact her for setting up an apt.  I also spoke with her husband about the current findings and plan. He expressed concerns for a repeat infection of drug resistant UTI. I told him that I am concerned for her having another UTI given the fact she is having dysuria which is why I would like ID to evaluate her further. He was in agreement with this plan as well.

## 2022-10-15 ENCOUNTER — Ambulatory Visit: Payer: Medicare Other | Admitting: Occupational Therapy

## 2022-10-18 ENCOUNTER — Telehealth: Payer: Self-pay

## 2022-10-18 NOTE — Telephone Encounter (Signed)
Per Jacquelin Hawking, PA had me reach out to [facility] on Friday July 12th to try and get patient scheduled for an appointment. Office was closed. Second attempt to get the patient scheduled was done today. Patient is scheduled with Rexene Alberts, PA at [facility] on July 22nd at 3 PM. Patient was notified via MyChart. Patient was also advised to reach our office if she has any questions or concerns regarding the message.

## 2022-10-18 NOTE — Telephone Encounter (Signed)
-----   Message from Oswaldo Conroy Mecum sent at 10/13/2022  3:54 PM EDT ----- Your urine culture results have returned and it appears that you have several different bacteria potentially causing a UTI.  Given your recent hospitalization for UTI with a multidrug-resistant bacteria I have requested assistance with treating you appropriately from infectious disease.  I would like to place a referral for you to go to an infectious disease specialist in [place] if you are are willing to see them. I think we will get a better outcome if you are consulted by the providers in [place] at this time. Let us know how you would like to proceed.

## 2022-10-19 ENCOUNTER — Other Ambulatory Visit: Payer: Self-pay | Admitting: Urology

## 2022-10-19 ENCOUNTER — Encounter: Payer: Self-pay | Admitting: Urology

## 2022-10-19 ENCOUNTER — Ambulatory Visit: Payer: Medicare Other | Admitting: Urology

## 2022-10-19 VITALS — BP 127/77 | HR 91 | Ht 63.0 in | Wt 355.0 lb

## 2022-10-19 DIAGNOSIS — N200 Calculus of kidney: Secondary | ICD-10-CM | POA: Diagnosis not present

## 2022-10-19 DIAGNOSIS — R1011 Right upper quadrant pain: Secondary | ICD-10-CM | POA: Diagnosis not present

## 2022-10-19 DIAGNOSIS — Z87440 Personal history of urinary (tract) infections: Secondary | ICD-10-CM | POA: Diagnosis not present

## 2022-10-19 DIAGNOSIS — N390 Urinary tract infection, site not specified: Secondary | ICD-10-CM

## 2022-10-19 MED ORDER — POTASSIUM CITRATE ER 15 MEQ (1620 MG) PO TBCR: 2.0000 | EXTENDED_RELEASE_TABLET | Freq: Two times a day (BID) | ORAL | 1 refills | Status: DC

## 2022-10-19 MED ORDER — TRAMADOL HCL 50 MG PO TABS: 50.0000 mg | ORAL_TABLET | Freq: Four times a day (QID) | ORAL | 0 refills | Status: AC | PRN

## 2022-10-19 NOTE — Progress Notes (Signed)
10/19/22 3:30 PM   Sharon Ware 1950/05/16 [id number]  CC: UTI, right renal stone, right flank pain  HPI: Very comorbid 72 year old female with morbid obesity and BMI of 63, diabetes, significant lymphedema who was recently hospitalized in June 2024 for UTI, CT showed a nonobstructing right renal pelvis stone measuring 13 mm.  She reports she has continued to have intermittent right-sided flank pain.  Urine culture at that time grew ESBL Klebsiella UTI.  She denies any UTI symptoms today of dysuria or urgency/frequency.  Denies any fevers or chills.  She has concerns about pain control today.  She reportedly has an allergy of hypertension to morphine, but this was 20 or 30 years ago when she had a hysterectomy and was postop and had a drop in blood pressure, unclear if this was truly an allergic reaction.  She never filled the tramadol that was prescribed by PCP because of possible allergic reaction.  PMH: Past Medical History:  Diagnosis Date   Arthritis    Breast cancer (HCC)    Cataract    Diabetes mellitus without complication (HCC)    Hyperlipidemia    Hypertension    Lymphedema    Vitamin D deficiency     Surgical History: Past Surgical History:  Procedure Laterality Date   ABDOMINAL HYSTERECTOMY     x2   BREAST BIOPSY Right 1990s   neg   BREAST BIOPSY Right 2000s   neg   BREAST BIOPSY Left 11/24/2020   u/s bx 10:30/8 cmfn-"heart" marker   BREAST EXCISIONAL BIOPSY Right    neg   BREAST LUMPECTOMY     BREAST LUMPECTOMY WITH RADIOFREQUENCY TAG IDENTIFICATION Left 12/10/2020   Procedure: BREAST LUMPECTOMY WITH RADIOFREQUENCY TAG IDENTIFICATION;  Surgeon: Campbell Lerner, MD;  Location: [facility] ORS;  Service: General;  Laterality: Left;   DILATION AND CURETTAGE OF UTERUS     EYE SURGERY  2004   cataract surgery and laser surgery   Salpingo-oophorectomy      Family History: Family History  Problem Relation Age of Onset   Breast cancer Mother 91   Heart  disease Father    Cancer Paternal Grandfather    Heart disease Paternal Grandfather     Social History:  reports that she has never smoked. She has never been exposed to tobacco smoke. She has never used smokeless tobacco. She reports that she does not currently use alcohol. She reports that she does not use drugs.  Physical Exam: BP 127/77   Pulse 91   Ht 5\' 3"  (1.6 m)   Wt (!) 355 lb (161 kg)   BMI 62.89 kg/m    Constitutional:  Alert and oriented, No acute distress. Cardiovascular: No clubbing, cyanosis, or edema. Respiratory: Normal respiratory effort, no increased work of breathing. GI: Abdomen is soft, nontender, nondistended, no abdominal masses   Pertinent Imaging: I have personally viewed and interpreted the CT abdomen pelvis showing a 13 mm right renal pelvis stone, likely uric acid based on density of 400hu, urine pH 5.5, no hydronephrosis.  Assessment & Plan:   Comorbid 72 year old female with 1.3 cm right renal pelvis stone that appears to be uric acid, no hydronephrosis on prior CT but reports intermittent right-sided flank pain, no clinical evidence of UTI symptoms today.  We discussed various treatment options for urolithiasis including observation with or without medical expulsive therapy, shockwave lithotripsy (SWL), ureteroscopy and laser lithotripsy with stent placement, and percutaneous nephrolithotomy.  We discussed that management is based on stone size, location, density, patient  co-morbidities, and patient preference. Stones <64mm in size have a >80% spontaneous passage rate. Data surrounding the use of tamsulosin for medical expulsive therapy is controversial, but meta analyses suggests it is most efficacious for distal stones between 5-19mm in size. Possible side effects include dizziness/lightheadedness, and retrograde ejaculation.SWL has a lower stone free rate in a single procedure, but also a lower complication rate compared to ureteroscopy and avoids a  stent and associated stent related symptoms. Possible complications include renal hematoma, steinstrasse, and need for additional treatment.  Ureteroscopy with laser lithotripsy and stent placement has a higher stone free rate than SWL in a single procedure, however increased complication rate including possible infection, ureteral injury, bleeding, and stent related morbidity. Common stent related symptoms include dysuria, urgency/frequency, and flank pain.  We also discussed consideration for a trial of dissolution therapy with potassium citrate to potentially avoid surgery with her other medical problems, risks and benefits discussed extensively including hyperkalemia, recurrent pain, recurrent infection, need for surgical treatment in the future.  After an extensive discussion of the risks and benefits of the above treatment options, the patient would like to proceed with trial of dissolution therapy with potassium citrate.  Potassium citrate 30 mEq twice daily x 1 month Lab visit for potassium at 1 week in 1 month, risk of hyperkalemia discussed at length Recommended NSAIDs for pain control, could trial tramadol but risk of allergic reaction reviewed RTC 1 month with CT prior to evaluate change in stone with potassium citrate, consider ureteroscopy if persistent stone or symptoms    Legrand Rams, MD 10/19/2022  [facility] [address], [address] [place], [place] [address] [phone]

## 2022-10-19 NOTE — Patient Instructions (Signed)
You have a 13 mm kidney stone on the right that is likely causing intermittent blockage in your right-sided back pain.  This is a uric acid stone, and can be dissolved with potassium citrate medication.  Potassium citrate can occasionally cause high potassium levels, so we will need to check blood work at 1 week in 1 month, and repeat a CT scan at 1 month to confirm the stone has dissolved.  In terms of pain, would recommend Aleve or ibuprofen for your back pain, you can try the tramadol if no improvement, however this could potentially cause an allergic reaction with your reported history of a allergic reaction to morphine.

## 2022-10-20 ENCOUNTER — Ambulatory Visit: Payer: Medicare Other | Admitting: Occupational Therapy

## 2022-10-22 ENCOUNTER — Ambulatory Visit: Payer: Medicare Other | Admitting: Occupational Therapy

## 2022-10-25 ENCOUNTER — Ambulatory Visit: Payer: Medicare Other | Admitting: Infectious Diseases

## 2022-10-26 ENCOUNTER — Other Ambulatory Visit: Payer: Self-pay

## 2022-10-26 ENCOUNTER — Other Ambulatory Visit
Admission: RE | Admit: 2022-10-26 | Discharge: 2022-10-26 | Disposition: A | Discharge status: Home / Self Care | Payer: Medicare Other | Attending: Urology | Admitting: Urology

## 2022-10-26 DIAGNOSIS — N390 Urinary tract infection, site not specified: Secondary | ICD-10-CM

## 2022-10-26 DIAGNOSIS — R1011 Right upper quadrant pain: Secondary | ICD-10-CM | POA: Diagnosis present

## 2022-10-26 LAB — URINALYSIS, COMPLETE (UACMP) WITH MICROSCOPIC
Bilirubin Urine: NEGATIVE
Glucose, UA: >1000 mg/dL — AB
Ketones, ur: NEGATIVE mg/dL
Nitrite: POSITIVE — AB
Protein, ur: 100 mg/dL — AB
Specific Gravity, Urine: 1.02 (ref 1.005–1.030)
WBC, UA: >50 WBC/hpf (ref 0–5)
pH: 7 (ref 5.0–8.0)

## 2022-10-26 LAB — POTASSIUM: Potassium: 4.4 mmol/L (ref 3.5–5.1)

## 2022-10-27 ENCOUNTER — Telehealth: Payer: Self-pay

## 2022-10-27 ENCOUNTER — Ambulatory Visit: Payer: Medicare Other | Admitting: Urology

## 2022-10-27 ENCOUNTER — Encounter: Payer: Medicare Other | Admitting: Occupational Therapy

## 2022-10-27 DIAGNOSIS — N390 Urinary tract infection, site not specified: Secondary | ICD-10-CM

## 2022-10-27 LAB — URINE CULTURE: Culture: >=100000 — AB

## 2022-10-27 MED ORDER — FOSFOMYCIN TROMETHAMINE 3 G PO PACK
3.0000 g | PACK | Freq: Once | ORAL | 0 refills | Status: AC
Start: 2022-10-27 — End: 2022-10-27

## 2022-10-27 NOTE — Telephone Encounter (Signed)
-----   Message from Sondra Come sent at 10/27/2022 12:38 PM EDT ----- Urinalysis suspicious for UTI, she has had resistant bacteria before and would recommend fosfomycin 3 g x 1 packet, we will contact her with finalized culture  Legrand Rams, MD 10/27/2022

## 2022-10-27 NOTE — Telephone Encounter (Signed)
Called pt informed her of the information below. Pt voiced understanding. RX sent.  

## 2022-10-28 ENCOUNTER — Telehealth: Payer: Self-pay

## 2022-10-28 LAB — URINE CULTURE

## 2022-10-28 NOTE — Telephone Encounter (Signed)
Incoming form from pharmacy stating that Fosfomycin is not covered by her insurance. She does not have RX benefits via her medicare supplement.   Can you advise on alternative based on final culture?

## 2022-10-29 ENCOUNTER — Encounter: Payer: Medicare Other | Admitting: Occupational Therapy

## 2022-10-29 NOTE — Telephone Encounter (Signed)
Called pt she states that she paid out of pocket for the Fosfomycin and has taken the packet.   She questions of there are additional recommendations based on the final urine culture. Advised pt on Cipro however she states that she is unwilling to take Cipro at this time.

## 2022-11-01 ENCOUNTER — Encounter: Payer: Self-pay | Admitting: Family Medicine

## 2022-11-01 ENCOUNTER — Ambulatory Visit (INDEPENDENT_AMBULATORY_CARE_PROVIDER_SITE_OTHER): Payer: Medicare Other | Admitting: Family Medicine

## 2022-11-01 VITALS — BP 116/75 | HR 82 | Temp 98.2°F

## 2022-11-01 DIAGNOSIS — E1159 Type 2 diabetes mellitus with other circulatory complications: Secondary | ICD-10-CM

## 2022-11-01 DIAGNOSIS — N390 Urinary tract infection, site not specified: Secondary | ICD-10-CM | POA: Diagnosis not present

## 2022-11-01 DIAGNOSIS — E118 Type 2 diabetes mellitus with unspecified complications: Secondary | ICD-10-CM

## 2022-11-01 DIAGNOSIS — E782 Mixed hyperlipidemia: Secondary | ICD-10-CM

## 2022-11-01 DIAGNOSIS — Z7984 Long term (current) use of oral hypoglycemic drugs: Secondary | ICD-10-CM

## 2022-11-01 DIAGNOSIS — R300 Dysuria: Secondary | ICD-10-CM

## 2022-11-01 DIAGNOSIS — I152 Hypertension secondary to endocrine disorders: Secondary | ICD-10-CM

## 2022-11-01 DIAGNOSIS — Z0000 Encounter for general adult medical examination without abnormal findings: Secondary | ICD-10-CM

## 2022-11-01 DIAGNOSIS — Z6841 Body Mass Index (BMI) 40.0 and over, adult: Secondary | ICD-10-CM

## 2022-11-01 LAB — BAYER DCA HB A1C WAIVED: HB A1C (BAYER DCA - WAIVED): 7.5 % — ABNORMAL HIGH (ref 4.8–5.6)

## 2022-11-01 MED ORDER — METFORMIN HCL ER 500 MG PO TB24: 1000.0000 mg | ORAL_TABLET | Freq: Two times a day (BID) | ORAL | 1 refills | Status: DC

## 2022-11-01 MED ORDER — SPIRONOLACTONE 50 MG PO TABS: 50.0000 mg | ORAL_TABLET | Freq: Every day | ORAL | 1 refills | Status: DC

## 2022-11-01 MED ORDER — LOSARTAN POTASSIUM-HCTZ 50-12.5 MG PO TABS: 1.0000 | ORAL_TABLET | Freq: Every day | ORAL | 1 refills | Status: DC

## 2022-11-01 MED ORDER — LOVASTATIN 20 MG PO TABS: ORAL_TABLET | ORAL | 1 refills | Status: DC

## 2022-11-01 NOTE — Progress Notes (Signed)
BP 116/75   Pulse 82   Temp 98.2 F (36.8 C) (Oral)   SpO2 97%    Subjective:    Patient ID: Sharon Ware, female    DOB: March 12, 1951, 72 y.o.   MRN: [id number]  HPI: Sharon Ware is a 72 y.o. female presenting on 11/01/2022 for comprehensive medical examination. Current medical complaints include:  DIABETES Hypoglycemic episodes:{Blank single:19197::"yes","no"} Polydipsia/polyuria: {Blank single:19197::"yes","no"} Visual disturbance: {Blank single:19197::"yes","no"} Chest pain: {Blank single:19197::"yes","no"} Paresthesias: {Blank single:19197::"yes","no"} Glucose Monitoring: {Blank single:19197::"yes","no"}  Accucheck frequency: {Blank single:19197::"Not Checking","Daily","BID","TID"}  Fasting glucose:  Post prandial:  Evening:  Before meals: Taking Insulin?: {Blank single:19197::"yes","no"}  Long acting insulin:  Short acting insulin: Blood Pressure Monitoring: {Blank single:19197::"not checking","rarely","daily","weekly","monthly","a few times a day","a few times a week","a few times a month"} Retinal Examination: {Blank single:19197::"Up to Date","Not up to Date"} Foot Exam: {Blank single:19197::"Up to Date","Not up to Date"} Diabetic Education: {Blank single:19197::"Completed","Not Completed"} Pneumovax: {Blank single:19197::"Up to Date","Not up to Date","unknown"} Influenza: {Blank single:19197::"Up to Date","Not up to Date","unknown"} Aspirin: {Blank single:19197::"yes","no"}  HYPERTENSION / HYPERLIPIDEMIA Satisfied with current treatment? {Blank single:19197::"yes","no"} Duration of hypertension: {Blank single:19197::"chronic","months","years"} BP monitoring frequency: {Blank single:19197::"not checking","rarely","daily","weekly","monthly","a few times a day","a few times a week","a few times a month"} BP range:  BP medication side effects: {Blank single:19197::"yes","no"} Past BP meds: {Blank  multiple:19196::"none","amlodipine","amlodipine/benazepril","atenolol","benazepril","benazepril/HCTZ","bisoprolol (bystolic)","carvedilol","chlorthalidone","clonidine","diltiazem","exforge HCT","HCTZ","irbesartan (avapro)","labetalol","lisinopril","lisinopril-HCTZ","losartan (cozaar)","methyldopa","nifedipine","olmesartan (benicar)","olmesartan-HCTZ","quinapril","ramipril","spironalactone","tekturna","valsartan","valsartan-HCTZ","verapamil"} Duration of hyperlipidemia: {Blank single:19197::"chronic","months","years"} Cholesterol medication side effects: {Blank single:19197::"yes","no"} Cholesterol supplements: {Blank multiple:19196::"none","fish oil","niacin","red yeast rice"} Past cholesterol medications: {Blank multiple:19196::"none","atorvastain (lipitor)","lovastatin (mevacor)","pravastatin (pravachol)","rosuvastatin (crestor)","simvastatin (zocor)","vytorin","fenofibrate (tricor)","gemfibrozil","ezetimide (zetia)","niaspan","lovaza"} Medication compliance: {Blank single:19197::"excellent compliance","good compliance","fair compliance","poor compliance"} Aspirin: {Blank single:19197::"yes","no"} Recent stressors: {Blank single:19197::"yes","no"} Recurrent headaches: {Blank single:19197::"yes","no"} Visual changes: {Blank single:19197::"yes","no"} Palpitations: {Blank single:19197::"yes","no"} Dyspnea: {Blank single:19197::"yes","no"} Chest pain: {Blank single:19197::"yes","no"} Lower extremity edema: {Blank single:19197::"yes","no"} Dizzy/lightheaded: {Blank single:19197::"yes","no"}  She currently lives with: husband Menopausal Symptoms: no  Functional Status Survey: Is the patient deaf or have difficulty hearing?: No Does the patient have difficulty seeing, even when wearing glasses/contacts?: No Does the patient have difficulty concentrating, remembering, or making decisions?: No Does the patient have difficulty walking or climbing stairs?: Yes Does the patient have difficulty dressing  or bathing?: No Does the patient have difficulty doing errands alone such as visiting a doctor's office or shopping?: Yes     11/01/2022   10:41 AM 08/16/2022    1:58 PM 08/09/2022   11:05 AM 08/04/2022    1:59 PM 07/13/2022    9:34 AM  Fall Risk   Falls in the past year? 0 0 0 0 0  Number falls in past yr: 0 0 0 0 0  Injury with Fall? 0 0 0 0 0  Risk for fall due to : No Fall Risks Impaired mobility No Fall Risks Impaired mobility;Impaired balance/gait No Fall Risks  Follow up Falls evaluation completed Falls evaluation completed Falls evaluation completed Falls evaluation completed Falls evaluation completed    Depression Screen    11/01/2022   10:41 AM 08/16/2022    1:58 PM 08/09/2022   11:06 AM 08/04/2022    2:00 PM 07/13/2022    9:33 AM  Depression screen PHQ 2/9  Decreased Interest 0 0 0 0 0  Down, Depressed, Hopeless 0 0 0 0 0  PHQ - 2 Score 0 0 0 0 0  Altered sleeping 0  0 0 0  Tired, decreased energy 0  0 0 0  Change in appetite 0  0 0 0  Feeling bad or failure about yourself  0  0 0 0  Trouble concentrating 0  0  0 0  Moving slowly or fidgety/restless 0  0 0 0  Suicidal thoughts 0  0 0 0  PHQ-9 Score 0  0 0 0  Difficult doing work/chores Not difficult at all  Not difficult at all Not difficult at all Not difficult at all     Advanced Directives Does patient have a HCPOA?    {Blank single:19197::"yes","no"} If yes, name and contact information:  Does patient have a living will or MOST form?  {Blank single:19197::"yes","no"}  Past Medical History:  Past Medical History:  Diagnosis Date   Arthritis    Breast cancer (HCC)    Cataract    Diabetes mellitus without complication (HCC)    Hyperlipidemia    Hypertension    Lymphedema    Vitamin D deficiency     Surgical History:  Past Surgical History:  Procedure Laterality Date   ABDOMINAL HYSTERECTOMY     x2   BREAST BIOPSY Right 1990s   neg   BREAST BIOPSY Right 2000s   neg   BREAST BIOPSY Left 11/24/2020   u/s  bx 10:30/8 cmfn-"heart" marker   BREAST EXCISIONAL BIOPSY Right    neg   BREAST LUMPECTOMY     BREAST LUMPECTOMY WITH RADIOFREQUENCY TAG IDENTIFICATION Left 12/10/2020   Procedure: BREAST LUMPECTOMY WITH RADIOFREQUENCY TAG IDENTIFICATION;  Surgeon: Campbell Lerner, MD;  Location: [facility] ORS;  Service: General;  Laterality: Left;   DILATION AND CURETTAGE OF UTERUS     EYE SURGERY  2004   cataract surgery and laser surgery   Salpingo-oophorectomy      Medications:  Current Outpatient Medications on File Prior to Visit  Medication Sig   acetaminophen (TYLENOL) 650 MG CR tablet Take 650 mg by mouth every 8 (eight) hours as needed for pain.   aspirin 81 MG tablet Take 81 mg by mouth daily.   Calcium Citrate-Vitamin D (CALCIUM CITRATE + D3 PO) Take 1,200 mg by mouth daily.   Cholecalciferol (VITAMIN D3) 125 MCG (5000 UT) TABS Take 5,000 Units by mouth daily.   dapagliflozin propanediol (FARXIGA) 10 MG TABS tablet Take 1 tablet (10 mg total) by mouth daily before breakfast.   fosfomycin (MONUROL) 3 g PACK Take 3 g by mouth daily.   Glucosamine HCl 1500 MG TABS Take 1,500 mg by mouth in the morning and at bedtime.   letrozole (FEMARA) 2.5 MG tablet Take 1 tablet (2.5 mg total) by mouth daily.   losartan-hydrochlorothiazide (HYZAAR) 50-12.5 MG tablet Take 1 tablet by mouth daily.   lovastatin (MEVACOR) 20 MG tablet TAKE 1 TABLET BY MOUTH EVERYDAY AT BEDTIME (Patient taking differently: Take 20 mg by mouth at bedtime. TAKE 1 TABLET BY MOUTH EVERYDAY AT BEDTIME)   Menthol, Topical Analgesic, (ICY HOT EX) Apply 1 application topically daily as needed (pain).   metFORMIN (GLUCOPHAGE-XR) 500 MG 24 hr tablet Take 2 tablets (1,000 mg total) by mouth 2 (two) times daily with a meal.   Omega-3 Fatty Acids (FISH OIL ULTRA) 1400 MG CAPS Take 1,400 mg by mouth daily.   Potassium Citrate 15 MEQ (1620 MG) TBCR Take 2 tablets by mouth 2 (two) times daily.   spironolactone (ALDACTONE) 50 MG tablet Take 1 tablet  (50 mg total) by mouth daily.   No current facility-administered medications on file prior to visit.    Allergies:  Allergies  Allergen Reactions   Other Anaphylaxis    Honey Dew Melon   Wound Dressing Adhesive Rash   Ciprofloxacin     Back spasms   Morphine  And Codeine Other (See Comments)    Hypotension   Latex Rash    Social History:  Social History   Socioeconomic History   Marital status: Married    Spouse name: Not on file   Number of children: Not on file   Years of education: Not on file   Highest education level: Not on file  Occupational History   Occupation: retired  Tobacco Use   Smoking status: Never    Passive exposure: Never   Smokeless tobacco: Never  Vaping Use   Vaping status: Never Used  Substance and Sexual Activity   Alcohol use: Not Currently   Drug use: Never   Sexual activity: Not Currently  Other Topics Concern   Not on file  Social History Narrative   Not on file   Social Determinants of Health   Financial Resource Strain: Low Risk  (10/26/2021)   Overall Financial Resource Strain (CARDIA)    Difficulty of Paying Living Expenses: Not hard at all  Food Insecurity: No Food Insecurity (09/30/2022)   Hunger Vital Sign    Worried About Running Out of Food in the Last Year: Never true    Ran Out of Food in the Last Year: Never true  Transportation Needs: No Transportation Needs (09/30/2022)   PRAPARE - Administrator, Civil Service (Medical): No    Lack of Transportation (Non-Medical): No  Physical Activity: Inactive (10/26/2021)   Exercise Vital Sign    Days of Exercise per Week: 0 days    Minutes of Exercise per Session: 0 min  Stress: No Stress Concern Present (10/26/2021)   [employer] of Occupational Health - Occupational Stress Questionnaire    Feeling of Stress : Not at all  Social Connections: Moderately Isolated (10/26/2021)   Social Connection and Isolation Panel [NHANES]    Frequency of Communication with  Friends and Family: More than three times a week    Frequency of Social Gatherings with Friends and Family: More than three times a week    Attends Religious Services: Never    Database administrator or Organizations: No    Attends Banker Meetings: Never    Marital Status: Married  Catering manager Violence: Not At Risk (09/25/2022)   Humiliation, Afraid, Rape, and Kick questionnaire    Fear of Current or Ex-Partner: No    Emotionally Abused: No    Physically Abused: No    Sexually Abused: No   Social History   Tobacco Use  Smoking Status Never   Passive exposure: Never  Smokeless Tobacco Never   Social History   Substance and Sexual Activity  Alcohol Use Not Currently    Family History:  Family History  Problem Relation Age of Onset   Breast cancer Mother 62   Heart disease Father    Cancer Paternal Grandfather    Heart disease Paternal Grandfather     Past medical history, surgical history, medications, allergies, family history and social history reviewed with patient today and changes made to appropriate areas of the chart.   Review of Systems  Constitutional: Negative.   HENT: Negative.    Eyes: Negative.   Respiratory: Negative.    Cardiovascular:  Positive for leg swelling. Negative for chest pain, palpitations, orthopnea, claudication and PND.  Gastrointestinal:  Positive for heartburn. Negative for abdominal pain, blood in stool, constipation, diarrhea, melena, nausea and vomiting.  Genitourinary:  Positive for dysuria. Negative for flank pain, frequency, hematuria and urgency.  Musculoskeletal: Negative.  Skin: Negative.   Neurological: Negative.   Endo/Heme/Allergies: Negative.   Psychiatric/Behavioral: Negative.      All other ROS negative except what is listed above and in the HPI.      Objective:    BP 116/75   Pulse 82   Temp 98.2 F (36.8 C) (Oral)   SpO2 97%   Wt Readings from Last 3 Encounters:  10/19/22 (!) 355 lb (161  kg)  09/25/22 (!) 354 lb (160.6 kg)  09/18/22 (!) 354 lb 15.1 oz (161 kg)     Physical Exam     11/01/2022   11:15 AM 10/24/2020   10:34 AM  6CIT Screen  What Year? 0 points 0 points  What month? 0 points 0 points  What time? 0 points 0 points  Count back from 20 0 points 0 points  Months in reverse 0 points 0 points  Repeat phrase 6 points 0 points  Total Score 6 points 0 points    Results for orders placed or performed during the hospital encounter of 10/26/22  Urine Culture   Specimen: Urine, Random  Result Value Ref Range   Specimen Description      URINE, RANDOM Performed at [facility], [address] [place], [place] [address]    Special Requests      NONE Performed at [facility], [address], [place], [place] [address]    Culture (A)     >=100,000 COLONIES/mL KLEBSIELLA PNEUMONIAE Confirmed Extended Spectrum Beta-Lactamase Producer (ESBL).  In bloodstream infections from ESBL organisms, carbapenems are preferred over piperacillin/tazobactam. They are shown to have a lower risk of mortality.    Report Status 10/28/2022 FINAL    Organism ID, Bacteria KLEBSIELLA PNEUMONIAE (A)       Susceptibility   Klebsiella pneumoniae - MIC*    AMPICILLIN >=32 RESISTANT Resistant     CEFAZOLIN >=64 RESISTANT Resistant     CEFEPIME >=32 RESISTANT Resistant     CEFTRIAXONE >=64 RESISTANT Resistant     CIPROFLOXACIN <=0.25 SENSITIVE Sensitive     GENTAMICIN <=1 SENSITIVE Sensitive     IMIPENEM <=0.25 SENSITIVE Sensitive     NITROFURANTOIN 64 INTERMEDIATE Intermediate     TRIMETH/SULFA >=320 RESISTANT Resistant     AMPICILLIN/SULBACTAM 16 INTERMEDIATE Intermediate     PIP/TAZO <=4 SENSITIVE Sensitive     * >=100,000 COLONIES/mL KLEBSIELLA PNEUMONIAE  Potassium  Result Value Ref Range   Potassium 4.4 3.5 - 5.1 mmol/L  Urinalysis, Complete w Microscopic -  Result Value Ref Range   Color, Urine YELLOW YELLOW   APPearance CLOUDY (A) CLEAR    Specific Gravity, Urine 1.020 1.005 - 1.030   pH 7.0 5.0 - 8.0   Glucose, UA >1,000 (A) NEGATIVE mg/dL   Hgb urine dipstick MODERATE (A) NEGATIVE   Bilirubin Urine NEGATIVE NEGATIVE   Ketones, ur NEGATIVE NEGATIVE mg/dL   Protein, ur 829 (A) NEGATIVE mg/dL   Nitrite POSITIVE (A) NEGATIVE   Leukocytes,Ua MODERATE (A) NEGATIVE   Squamous Epithelial / HPF 0-5 0 - 5 /HPF   WBC, UA >50 0 - 5 WBC/hpf   RBC / HPF 6-10 0 - 5 RBC/hpf   Bacteria, UA MANY (A) NONE SEEN   WBC Clumps PRESENT       Assessment & Plan:   Problem List Items Addressed This Visit       Endocrine   Controlled diabetes mellitus type 2 with complications (HCC) - Primary   Relevant Orders   Bayer DCA Hb  A1c Waived   CBC with Differential/Platelet   Comprehensive metabolic panel   Lipid Panel w/o Chol/HDL Ratio   Microalbumin, Urine Waived     Preventative Services:  AAA screening:  Health Risk Assessment and Personalized Prevention Plan: Bone Mass Measurements: Breast Cancer Screening: CVD Screening:  Cervical Cancer Screening: Colon Cancer Screening:  Depression Screening:  Diabetes Screening:  Glaucoma Screening:  Hepatitis B vaccine: Hepatitis C screening:  HIV Screening: Flu Vaccine: Lung cancer Screening: Obesity Screening:  Pneumonia Vaccines (2): STI Screening:  Follow up plan: No follow-ups on file.   LABORATORY TESTING:  - Pap smear: not applicable  IMMUNIZATIONS:   - Tdap: Tetanus vaccination status reviewed: {tetanus status:315746}. - Influenza: {Blank single:19197::"Up to date","Administered today","Postponed to flu season","Refused","Given elsewhere"} - Pneumovax: {Blank single:19197::"Up to date","Administered today","Not applicable","Refused","Given elsewhere"} - Prevnar: {Blank single:19197::"Up to date","Administered today","Not applicable","Refused","Given elsewhere"} - Zostavax vaccine: {Blank single:19197::"Up to date","Administered today","Not applicable","Refused","Given  elsewhere"}  SCREENING: -Mammogram: {Blank single:19197::"Up to date","Ordered today","Not applicable","Refused","Done elsewhere"}  - Colonoscopy: {Blank single:19197::"Up to date","Ordered today","Not applicable","Refused","Done elsewhere"}  - Bone Density: {Blank single:19197::"Up to date","Ordered today","Not applicable","Refused","Done elsewhere"}   PATIENT COUNSELING:   Advised to take 1 mg of folate supplement per day if capable of pregnancy.   Sexuality: Discussed sexually transmitted diseases, partner selection, use of condoms, avoidance of unintended pregnancy  and contraceptive alternatives.   Advised to avoid cigarette smoking.  I discussed with the patient that most people either abstain from alcohol or drink within safe limits (<=14/week and <=4 drinks/occasion for males, <=7/weeks and <= 3 drinks/occasion for females) and that the risk for alcohol disorders and other health effects rises proportionally with the number of drinks per week and how often a drinker exceeds daily limits.  Discussed cessation/primary prevention of drug use and availability of treatment for abuse.   Diet: Encouraged to adjust caloric intake to maintain  or achieve ideal body weight, to reduce intake of dietary saturated fat and total fat, to limit sodium intake by avoiding high sodium foods and not adding table salt, and to maintain adequate dietary potassium and calcium preferably from fresh fruits, vegetables, and low-fat dairy products.    stressed the importance of regular exercise  Injury prevention: Discussed safety belts, safety helmets, smoke detector, smoking near bedding or upholstery.   Dental health: Discussed importance of regular tooth brushing, flossing, and dental visits.    NEXT PREVENTATIVE PHYSICAL DUE IN 1 YEAR. No follow-ups on file.

## 2022-11-02 ENCOUNTER — Other Ambulatory Visit: Payer: Self-pay

## 2022-11-02 ENCOUNTER — Ambulatory Visit: Payer: Medicare Other | Admitting: Urology

## 2022-11-02 DIAGNOSIS — N390 Urinary tract infection, site not specified: Secondary | ICD-10-CM

## 2022-11-02 NOTE — Telephone Encounter (Signed)
See my chart message

## 2022-11-03 ENCOUNTER — Ambulatory Visit: Payer: Medicare Other | Admitting: Occupational Therapy

## 2022-11-03 ENCOUNTER — Encounter: Payer: Self-pay | Admitting: Family Medicine

## 2022-11-03 DIAGNOSIS — I890 Lymphedema, not elsewhere classified: Secondary | ICD-10-CM

## 2022-11-03 LAB — URINALYSIS, ROUTINE W REFLEX MICROSCOPIC
Bilirubin, UA: NEGATIVE
Nitrite, UA: NEGATIVE
Specific Gravity, UA: 1.02 (ref 1.005–1.030)
Urobilinogen, Ur: 0.2 mg/dL (ref 0.2–1.0)
pH, UA: 6 (ref 5.0–7.5)

## 2022-11-03 LAB — MICROALBUMIN, URINE WAIVED
Creatinine, Urine Waived: 100 mg/dL (ref 10–300)
Microalb, Ur Waived: 150 mg/L — ABNORMAL HIGH (ref 0–19)
Microalb/Creat Ratio: >300 mg/g — ABNORMAL HIGH (ref <30)

## 2022-11-03 LAB — MICROSCOPIC EXAMINATION

## 2022-11-03 NOTE — Assessment & Plan Note (Signed)
Under good control on current regimen. Continue current regimen. Continue to monitor. Call with any concerns. Refills given. Labs drawn today.   

## 2022-11-03 NOTE — Assessment & Plan Note (Signed)
Encouraged diet and exercise with goal of losing 1-2lbs per week.  

## 2022-11-03 NOTE — Therapy (Signed)
OUTPATIENT OCCUPATIONAL THERAPY TREATMENT  BILATERAL LOWER EXTREMITY LYMPHEDEMA  Patient Name: Sharon Ware MRN: [id number] DOB:01-01-1951, 72 y.o., female Today's Date: 11/03/2022  END OF SESSION:   OT End of Session - 11/03/22 1142     Visit Number 8    Number of Visits 36    Date for OT Re-Evaluation 12/06/22    OT Start Time 0900    OT Stop Time 1010    OT Time Calculation (min) 70 min    Activity Tolerance Patient tolerated treatment well;No increased pain    Behavior During Therapy WFL for tasks assessed/performed               Past Medical History:  Diagnosis Date   Arthritis    Breast cancer (HCC)    Cataract    Diabetes mellitus without complication (HCC)    Hyperlipidemia    Hypertension    Lymphedema    Vitamin D deficiency    Past Surgical History:  Procedure Laterality Date   ABDOMINAL HYSTERECTOMY     x2   BREAST BIOPSY Right 1990s   neg   BREAST BIOPSY Right 2000s   neg   BREAST BIOPSY Left 11/24/2020   u/s bx 10:30/8 cmfn-"heart" marker   BREAST EXCISIONAL BIOPSY Right    neg   BREAST LUMPECTOMY     BREAST LUMPECTOMY WITH RADIOFREQUENCY TAG IDENTIFICATION Left 12/10/2020   Procedure: BREAST LUMPECTOMY WITH RADIOFREQUENCY TAG IDENTIFICATION;  Surgeon: Campbell Lerner, MD;  Location: [facility] ORS;  Service: General;  Laterality: Left;   DILATION AND CURETTAGE OF UTERUS     EYE SURGERY  2004   cataract surgery and laser surgery   Salpingo-oophorectomy     Patient Active Problem List   Diagnosis Date Noted   Acute cystitis without hematuria 09/28/2022   Urinary tract infection due to ESBL Klebsiella 09/25/2022   AKI (acute kidney injury) (HCC) 09/25/2022   Lymphedema 09/25/2022   Hypoxia 09/25/2022   Cellulitis 08/04/2022   Edema 01/30/2021   Malignant neoplasm of upper-inner quadrant of left breast in female, estrogen receptor positive (HCC) 12/01/2020   Goals of care, counseling/discussion 12/01/2020   Morbid obesity (HCC)  08/08/2018   BMI 60.0-69.9, adult (HCC) 08/08/2018   Controlled diabetes mellitus type 2 with complications (HCC) 08/08/2018   Hyperlipidemia 08/08/2018   Hypertension associated with diabetes (HCC) 08/08/2018   Arthritis of both knees 08/08/2018   Vitamin D deficiency 08/08/2018   PCP: Olevia Perches, DO  REFERRING PROVIDER: Gwynn Burly, DO  REFERRING DIAG: I89.0  THERAPY DIAG:  Lymphedema, not elsewhere classified [I89.0]  Rationale for Evaluation and Treatment: Rehabilitation  ONSET DATE: 1 yr ago without known precipitating event- by report  SUBJECTIVE:  SUBJECTIVE STATEMENT: Sharon Ware presents for OT treatment to address BLE lymphedema, L>R. Pt was last seen on 10/13/21. Pt is accompanied by her spouse, [name], who assists her with transfers and with compression bandaging at home between visits. Pt denies LE-related leg pain. She arrives seated in a transport wheelchair w complaints of kidney stone. Pain is not rated.  Pt completed SPT with Max A from spouse.   Tobacco Use: Low Risk  (11/01/2022)   Patient History    Smoking Tobacco Use: Never    Smokeless Tobacco Use: Never    Passive Exposure: Never   PERTINENT HISTORY: HTN, Obesity w BMI 60-69.9, DM, B knee arthritis, Hx L breast cancer, recurrent L leg cellulitis. MRSA test results pending  PAIN: Are you having pain? Denies LE related pain  PRECAUTIONS: Fall and Other: lymphedema precautions, DM. Hx L Br Ca  PRIOR LEVEL OF FUNCTION: Independent with household mobility with device, Requires assistive device for independence, Needs assistance with homemaking, Needs assistance with gait, and Needs assistance with transfers  PATIENT GOALS: ... Get improvement for my condition...., keep lymphedema from getting worse, and reduce infection  risk"  LYMPHEDEMA ASSESSMENTS:  FOTO Functional Outcomes Measure: Initial 33%  Lymphedema Life Impact Scale (LLIS): 22.06%  BLE COMPARATIVE LIMB VOLUMETRICS Initial 09/14/22  LANDMARK RIGHT    R LEG (A-D) 7717.1 ml  R THIGH (E-G) ml  R FULL LIMB (A-G) ml  Limb Volume differential (LVD)  %  Volume change since initial %  Volume change overall V  (Blank rows = not tested)  LANDMARK LEFT   R LEG (A-D) 7752.1 ml  R THIGH (E-G) ml  R FULL LIMB (A-G) ml  Limb Volume differential (LVD)  Leg LVD measures 0.5%, L>R   Volume change since initial %  Volume change overall %  (Blank rows = not tested)   TODAY'S TREATMENT:                                                                                                                                         LLE MLD w/ simultaneous skin care  Knee length LLE multilayer compression wraps- as established  PATIENT EDUCATION:  Continued Pt/ CG edu for lymphedema self care home program throughout session. Topics include outcome of comparative limb volumetrics- starting limb volume differentials (LVDs), technology and gradient techniques used for short stretch, multilayer compression wrapping, simple self-MLD, therapeutic lymphatic pumping exercises, skin/nail care, LE precautions,. compression garment recommendations and specifications, wear and care schedule and compression garment donning / doffing w assistive devices. Discussed progress towards all OT goals since commencing CDT. All questions answered to the Pt's satisfaction. Good return. Person educated: Patient and Spouse Education method: Explanation, Demonstration, and Handouts Education comprehension: verbalized understanding, returned demonstration, verbal cues required, and needs further education  Lymphedema HOME PROGRAM:Complete Decongestive Therapy (CDT) Self-Management Phase Daily simple self-MLD Daily BLE skin care and inspection Daily lymphatic  pumping there ex Appropriate daily  compression- knee length, will require max caregiver A to  and doff Leg elevation when seated  ASSESSMENT: CLINICAL IMPRESSION: Pt has been wrapped daily by her spouse using gradient taught in OT throughout her long visit interval due to painful kidney stone. LLE limb volume below the knee is decreased significantly and skin condition has vastly improved in terms of hydration and decreased lichenization. Skin remains reddened, but less painful with light palpation during manual therapy. We continued MLD , skin care and wrapping    this morning as established while providing edu re compression garment options, recommendations, and DME process. Pt agrees w plan to measure for the LLE knee length garment next session. Cont as per POC.  OBJECTIVE IMPAIRMENTS: Abnormal gait, decreased activity tolerance, decreased balance, decreased endurance, decreased knowledge of condition, decreased knowledge of use of DME, decreased mobility, difficulty walking, decreased ROM, decreased strength, increased edema, impaired perceived functional ability, postural dysfunction, obesity, pain, and chronic progressive limb swelling with elevated infection risk and risk of non-healing wounds .   ACTIVITY LIMITATIONS: carrying, lifting, bending, sitting, standing, squatting, sleeping, stairs, transfers, bed mobility, bathing, toileting, dressing, hygiene/grooming, and caring for others  PARTICIPATION LIMITATIONS: meal prep, cleaning, laundry, driving, shopping, community activity, occupation, yard work, and church  PERSONAL FACTORS: 3+ comorbidities: HTN, Stage III Obesity, B knee arthritis pain and inflammation and impaired AROM 2/2 body habitus  are also affecting patient's functional outcome.   REHAB POTENTIAL:  Fair with daily CG assistance throughout Intensive Phase CDT.  Poor without daily CG assistance with all LE self care home program components  GOALS: Goals reviewed with patient? Yes  SHORT TERM GOALS: Target  date: 4th OT Rx visit   Pt will demonstrate understanding of lymphedema precautions and prevention strategies with modified independence using a printed reference to identify at least 5 precautions and discussing how s/he may implement them into daily life to reduce risk of progression with modified assistance ( printed reference). Baseline: Max A Goal status: Initial  2.  Pt will be able to apply multilayer, thigh length, compression wraps using gradient techniques with Max caregiver assistance to decrease limb volume, to limit infection risk, and to limit lymphedema progression.  Baseline: Dependent Goal status: Initial  LONG TERM GOALS: Target date: 12/06/22  Given this patient's Intake score of 22.06 % on the Lymphedema Life Impact Scale (LLIS), patient will experience a reduction of at least 5 points in her perceived level of functional impairment resulting from lymphedema to improve functional performance and quality of life (QOL). Baseline: 22.06 % Goal status:Initial  2.  Given this patient's Intake score of TBA/100% on the functional outcomes FOTO tool, patient will experience an increase in function of 3 points to improve basic and instrumental ADLs performance, including lymphedema self-care.  Baseline: TBA% Goal status: INITIAL  3.  During Intensive phase CDT Pt will achieve at least 85% compliance with all lymphedema self-care home program components, including  daily skin care, multilayer , gradient compression wraps with daily changes, daily simple self MLD and daily lymphatic pumping therex to achieve optimal clinical outcome and to habituate self care regime for optimal LE self-management over time. Baseline: Dependent Goal status:INITIAL  4.  Pt will achieve at least a 10% volume reductions bilaterally below the knees to return limb to more typical size and shape, to limit infection risk and LE progression, to decrease pain, to improve function, and to improve body image and  QOL. Baseline: Dependent Goal status:INITIAL  5.  Pt will be able to don and doff appropriate compression garments and devices using assistive devices and extra time and Max CG assistance within 1 week of issue date for optimal lymphedema self-care. Baseline: Dependent Goal status: INITIAL PLAN:  PT FREQUENCY: 2 x/week  PT DURATION: 12  weeks other: and PRN  PLANNED INTERVENTIONS: Therapeutic exercises, Therapeutic activity, Patient/Family education, Self Care, DME instructions, Manual lymph drainage, Compression bandaging, Manual therapy, and skin care during MLD, fit with appropriate custom compression garments and devices, fit with appropriate compression device  which  follows lymphatic pathways and anatomic distribution  PLAN FOR NEXT SESSION:  LLE bandages Pt/family edu for self-bandaging  Loel Dubonnet, MS, OTR/L, CLT-LANA 11/03/22 11:47 AM

## 2022-11-03 NOTE — Assessment & Plan Note (Signed)
Stable with A1c of 7.5 Does not want to stay on farxiga due to urinary symptoms. Will stop farxiga and start her on rybelsus. Follow up 3 months. Call with any concerns.

## 2022-11-03 NOTE — Patient Instructions (Signed)
Preventative Services:  Health Risk Assessment and Personalized Prevention Plan:Done today Bone Mass Measurements: Up to date Breast Cancer Screening: Scheduled CVD Screening: Done today Cervical Cancer Screening: N/A Colon Cancer Screening: up to date Depression Screening: Done today Diabetes Screening: Done today Glaucoma Screening: See your eye doctor Hepatitis B vaccine: N/A Hepatitis C screening: up to date HIV Screening: up to date Flu Vaccine: get in the fall Lung cancer Screening: N/A Obesity Screening: Done today Pneumonia Vaccines (2): up to date STI Screening: N/A

## 2022-11-05 ENCOUNTER — Encounter: Payer: Self-pay | Admitting: Occupational Therapy

## 2022-11-05 ENCOUNTER — Ambulatory Visit: Payer: Medicare Other | Attending: Internal Medicine | Admitting: Occupational Therapy

## 2022-11-05 ENCOUNTER — Ambulatory Visit: Payer: Medicare Other | Admitting: Occupational Therapy

## 2022-11-05 DIAGNOSIS — N289 Disorder of kidney and ureter, unspecified: Secondary | ICD-10-CM | POA: Diagnosis present

## 2022-11-05 DIAGNOSIS — I890 Lymphedema, not elsewhere classified: Secondary | ICD-10-CM | POA: Insufficient documentation

## 2022-11-05 NOTE — Therapy (Signed)
OUTPATIENT OCCUPATIONAL THERAPY TREATMENT  BILATERAL LOWER EXTREMITY LYMPHEDEMA  Patient Name: Sharon Ware MRN: [id number] DOB:Apr 24, 1950, 72 y.o., female Today's Date: 11/05/2022  END OF SESSION:   OT End of Session - 11/05/22 1010     Visit Number 9    Number of Visits 36    Date for OT Re-Evaluation 12/06/22    OT Start Time 1005    OT Stop Time 1110    OT Time Calculation (min) 65 min    Activity Tolerance Patient tolerated treatment well;No increased pain    Behavior During Therapy WFL for tasks assessed/performed               Past Medical History:  Diagnosis Date   Arthritis    Breast cancer (HCC)    Cataract    Diabetes mellitus without complication (HCC)    Hyperlipidemia    Hypertension    Lymphedema    Vitamin D deficiency    Past Surgical History:  Procedure Laterality Date   ABDOMINAL HYSTERECTOMY     x2   BREAST BIOPSY Right 1990s   neg   BREAST BIOPSY Right 2000s   neg   BREAST BIOPSY Left 11/24/2020   u/s bx 10:30/8 cmfn-"heart" marker   BREAST EXCISIONAL BIOPSY Right    neg   BREAST LUMPECTOMY     BREAST LUMPECTOMY WITH RADIOFREQUENCY TAG IDENTIFICATION Left 12/10/2020   Procedure: BREAST LUMPECTOMY WITH RADIOFREQUENCY TAG IDENTIFICATION;  Surgeon: Campbell Lerner, MD;  Location: [facility] ORS;  Service: General;  Laterality: Left;   DILATION AND CURETTAGE OF UTERUS     EYE SURGERY  2004   cataract surgery and laser surgery   Salpingo-oophorectomy     Patient Active Problem List   Diagnosis Date Noted   Acute cystitis without hematuria 09/28/2022   Urinary tract infection due to ESBL Klebsiella 09/25/2022   AKI (acute kidney injury) (HCC) 09/25/2022   Lymphedema 09/25/2022   Hypoxia 09/25/2022   Cellulitis 08/04/2022   Edema 01/30/2021   Malignant neoplasm of upper-inner quadrant of left breast in female, estrogen receptor positive (HCC) 12/01/2020   Goals of care, counseling/discussion 12/01/2020   Morbid obesity (HCC) 08/08/2018    BMI 60.0-69.9, adult (HCC) 08/08/2018   Controlled diabetes mellitus type 2 with complications (HCC) 08/08/2018   Hyperlipidemia 08/08/2018   Hypertension associated with diabetes (HCC) 08/08/2018   Arthritis of both knees 08/08/2018   Vitamin D deficiency 08/08/2018   PCP: Olevia Perches, DO  REFERRING PROVIDER: Gwynn Burly, DO  REFERRING DIAG: I89.0  THERAPY DIAG:  Lymphedema, not elsewhere classified [I89.0]  Rationale for Evaluation and Treatment: Rehabilitation  ONSET DATE: 1 yr ago without known precipitating event- by report  SUBJECTIVE:  SUBJECTIVE STATEMENT: Tresea Heine presents for OT treatment to address BLE lymphedema, L>R. Pt is accompanied by her spouse, [name], who assists her with transfers and with compression bandaging at home between visits. Pt denies LE-related leg pain. She arrives seated in a transport wheelchair w complaints of kidney stone. Pain is not rated.  Pt completed SPT with Max A from spouse.   Tobacco Use: Low Risk  (11/05/2022)   Patient History    Smoking Tobacco Use: Never    Smokeless Tobacco Use: Never    Passive Exposure: Never   PERTINENT HISTORY: HTN, Obesity w BMI 60-69.9, DM, B knee arthritis, Hx L breast cancer, recurrent L leg cellulitis. MRSA test results pending  PAIN: Are you having pain? Denies LE related pain  PRECAUTIONS: Fall and Other: lymphedema precautions, DM. Hx L Br Ca  PRIOR LEVEL OF FUNCTION: Independent with household mobility with device, Requires assistive device for independence, Needs assistance with homemaking, Needs assistance with gait, and Needs assistance with transfers  PATIENT GOALS: ... Get improvement for my condition...., keep lymphedema from getting worse, and reduce infection risk"  LYMPHEDEMA ASSESSMENTS:  FOTO  Functional Outcomes Measure: Initial 33%  Lymphedema Life Impact Scale (LLIS): 22.06%  BLE COMPARATIVE LIMB VOLUMETRICS Initial 09/14/22  LANDMARK RIGHT    R LEG (A-D) 7717.1 ml  R THIGH (E-G) ml  R FULL LIMB (A-G) ml  Limb Volume differential (LVD)  %  Volume change since initial %  Volume change overall V  (Blank rows = not tested)  LANDMARK LEFT   L LEG (A-D) 7752.1 ml  L THIGH (E-G) ml  L FULL LIMB (A-G) ml  Limb Volume differential (LVD)  Leg LVD measures 0.5%, L>R   Volume change since initial %  Volume change overall %   (Blank rows = not tested)    BLE COMPARATIVE LIMB VOLUMETRICS 9th visit 11/05/22  LANDMARK LEFT   L LEG (A-D) 5445.7 ml  L THIGH (E-G) ml  L FULL LIMB (A-G) ml  Limb Volume differential (LVD)    Volume change since initial  L LEG reduced in volume by 29.43% since 09/14/22  Volume change overall %  (Blank rows = not tested)   TODAY'S TREATMENT:                                                                                                                                         LLE Comparative limb volumetrics Knee length LLE multilayer compression wraps- as established Pt edu  PATIENT EDUCATION:  Continued Pt/ CG edu for lymphedema self care home program throughout session. Topics include outcome of comparative limb volumetrics- starting limb volume differentials (LVDs), technology and gradient techniques used for short stretch, multilayer compression wrapping, simple self-MLD, therapeutic lymphatic pumping exercises, skin/nail care, LE precautions,. compression garment recommendations and specifications, wear and care schedule and compression garment donning / doffing w assistive devices. Discussed progress towards all  OT goals since commencing CDT. All questions answered to the Pt's satisfaction. Good return. Person educated: Patient and Spouse Education method: Explanation, Demonstration, and Handouts Education comprehension: verbalized  understanding, returned demonstration, verbal cues required, and needs further education  Lymphedema HOME PROGRAM:Complete Decongestive Therapy (CDT) Self-Management Phase Daily simple self-MLD Daily BLE skin care and inspection Daily lymphatic pumping there ex Appropriate daily compression- knee length, will require max caregiver A to  and doff Leg elevation when seated  ASSESSMENT: CLINICAL IMPRESSION:  LLE comparative limb volumetrics for visit 9 reveal L LEG is reduced in volume by 29.43% since 09/14/22.This value meets and exceeds the initial 10% limb volume reduction goal for the L Leg. Remainder of session provided Pt and family edu about custom compression garment options, specifications, and recommendations. Applied clean multilayer compression wraps to L LEG below the knee. Cont as per POC. Pt demonstrates excellent progress towards goals with her spouse's assistance.Cont as per POC. L Leg compression garments measured next session.  OBJECTIVE IMPAIRMENTS: Abnormal gait, decreased activity tolerance, decreased balance, decreased endurance, decreased knowledge of condition, decreased knowledge of use of DME, decreased mobility, difficulty walking, decreased ROM, decreased strength, increased edema, impaired perceived functional ability, postural dysfunction, obesity, pain, and chronic progressive limb swelling with elevated infection risk and risk of non-healing wounds .   ACTIVITY LIMITATIONS: carrying, lifting, bending, sitting, standing, squatting, sleeping, stairs, transfers, bed mobility, bathing, toileting, dressing, hygiene/grooming, and caring for others  PARTICIPATION LIMITATIONS: meal prep, cleaning, laundry, driving, shopping, community activity, occupation, yard work, and church  PERSONAL FACTORS: 3+ comorbidities: HTN, Stage III Obesity, B knee arthritis pain and inflammation and impaired AROM 2/2 body habitus  are also affecting patient's functional outcome.   REHAB  POTENTIAL:  Fair with daily CG assistance throughout Intensive Phase CDT.  Poor without daily CG assistance with all LE self care home program components  GOALS: Goals reviewed with patient? Yes  SHORT TERM GOALS: Target date: 4th OT Rx visit   Pt will demonstrate understanding of lymphedema precautions and prevention strategies with modified independence using a printed reference to identify at least 5 precautions and discussing how s/he may implement them into daily life to reduce risk of progression with modified assistance ( printed reference). Baseline: modified independence ( printed reference) Goal status: 11/05/22 GOAL MET  2.  Pt will be able to apply multilayer, thigh length, compression wraps using gradient techniques with Max caregiver assistance to decrease limb volume, to limit infection risk, and to limit lymphedema progression.  Baseline: Max A Goal status: 11/05/22 GOAL MET  LONG TERM GOALS: Target date: 12/06/22  Given this patient's Intake score of 22.06 % on the Lymphedema Life Impact Scale (LLIS), patient will experience a reduction of at least 5 points in her perceived level of functional impairment resulting from lymphedema to improve functional performance and quality of life (QOL). Baseline: 22.06 % Goal status ONGOING-PROGRESSING  2.  Given this patient's Intake score of 33 % on the functional outcomes FOTO tool, patient will experience an increase in function of 3 points to improve basic and instrumental ADLs performance, including lymphedema self-care.  Baseline: 33 % Goal status: ONGOING-PROGRESSING  3.  During Intensive phase CDT Pt will achieve at least 85% compliance with all lymphedema self-care home program components, including  daily skin care, multilayer , gradient compression wraps with daily changes, daily simple self MLD and daily lymphatic pumping therex to achieve optimal clinical outcome and to habituate self care regime for optimal LE self-management  over time.  Baseline: Dependent Goal status:11/05/22 GOAL MET  4.  Pt will achieve at least a 10% volume reductions bilaterally below the knees to return limb to more typical size and shape, to limit infection risk and LE progression, to decrease pain, to improve function, and to improve body image and QOL. Baseline: Dependent Goal status:11/05/22 GOAL for L LEG w 29% reduction.  5.  Pt will be able to don and doff appropriate compression garments and devices using assistive devices and extra time and Max CG assistance within 1 week of issue date for optimal lymphedema self-care. Baseline: Dependent Goal status: ONGOING PLAN:  PT FREQUENCY: 2 x/week  PT DURATION: 12  weeks other: and PRN  PLANNED INTERVENTIONS: Therapeutic exercises, Therapeutic activity, Patient/Family education, Self Care, DME instructions, Manual lymph drainage, Compression bandaging, Manual therapy, and skin care during MLD, fit with appropriate custom compression garments and devices, fit with appropriate compression device  which  follows lymphatic pathways and anatomic distribution  PLAN FOR NEXT SESSION:  LLE bandages Pt/family edu for compression Anatomical measurements for garments  Loel Dubonnet, MS, OTR/L, CLT-LANA 11/05/22 11:23 AM

## 2022-11-06 ENCOUNTER — Other Ambulatory Visit: Payer: Self-pay | Admitting: Family Medicine

## 2022-11-06 DIAGNOSIS — N289 Disorder of kidney and ureter, unspecified: Secondary | ICD-10-CM

## 2022-11-08 NOTE — Progress Notes (Signed)
Called and scheduled patient on 11/22/2022 @ 9:40 am.

## 2022-11-09 ENCOUNTER — Ambulatory Visit: Payer: Medicare Other | Admitting: Occupational Therapy

## 2022-11-09 ENCOUNTER — Other Ambulatory Visit: Payer: Self-pay | Admitting: Family Medicine

## 2022-11-09 ENCOUNTER — Encounter: Payer: Self-pay | Admitting: Infectious Diseases

## 2022-11-09 ENCOUNTER — Ambulatory Visit: Payer: Medicare Other | Attending: Infectious Diseases | Admitting: Infectious Diseases

## 2022-11-09 VITALS — BP 143/83 | HR 92 | Temp 96.6°F

## 2022-11-09 DIAGNOSIS — E785 Hyperlipidemia, unspecified: Secondary | ICD-10-CM | POA: Diagnosis not present

## 2022-11-09 DIAGNOSIS — I10 Essential (primary) hypertension: Secondary | ICD-10-CM | POA: Diagnosis not present

## 2022-11-09 DIAGNOSIS — Z79811 Long term (current) use of aromatase inhibitors: Secondary | ICD-10-CM | POA: Insufficient documentation

## 2022-11-09 DIAGNOSIS — N958 Other specified menopausal and perimenopausal disorders: Secondary | ICD-10-CM | POA: Diagnosis not present

## 2022-11-09 DIAGNOSIS — Z22358 Carrier of other enterobacterales: Secondary | ICD-10-CM | POA: Diagnosis not present

## 2022-11-09 DIAGNOSIS — I890 Lymphedema, not elsewhere classified: Secondary | ICD-10-CM | POA: Insufficient documentation

## 2022-11-09 DIAGNOSIS — N200 Calculus of kidney: Secondary | ICD-10-CM | POA: Diagnosis not present

## 2022-11-09 DIAGNOSIS — B961 Klebsiella pneumoniae [K. pneumoniae] as the cause of diseases classified elsewhere: Secondary | ICD-10-CM | POA: Insufficient documentation

## 2022-11-09 DIAGNOSIS — Z993 Dependence on wheelchair: Secondary | ICD-10-CM | POA: Diagnosis not present

## 2022-11-09 DIAGNOSIS — E119 Type 2 diabetes mellitus without complications: Secondary | ICD-10-CM | POA: Diagnosis not present

## 2022-11-09 DIAGNOSIS — Z79899 Other long term (current) drug therapy: Secondary | ICD-10-CM | POA: Diagnosis not present

## 2022-11-09 DIAGNOSIS — N390 Urinary tract infection, site not specified: Secondary | ICD-10-CM | POA: Insufficient documentation

## 2022-11-09 MED ORDER — ESTRADIOL 0.1 MG/GM VA CREA: 1.0000 | TOPICAL_CREAM | Freq: Every day | VAGINAL | 2 refills | Status: DC

## 2022-11-09 MED ORDER — ESTRADIOL 0.1 MG/GM VA CREA: 1.0000 | TOPICAL_CREAM | VAGINAL | 2 refills | Status: DC

## 2022-11-09 NOTE — Progress Notes (Unsigned)
NAME: Sharon Ware  DOB: Mar 19, 1951  MRN: [id number]  Date/Time: 11/09/2022 8:56 AM  Subjective:   ?is here with her husband [name] is a 72 y.o. with a history of Hypertension, hyperlipidemia, diabetes mellitus, Has had chronic dysuria, frequency . Pt has repeated urine culture positive for ESBl organisms She was recently in  [facility] between 09/25/2022 until 09/29/2022 for dysuria and treated as a urinary tract infection due to ESBL Klebsiella with 5 days of meropenem Patient felt better for a day and then the symptoms returned.  During that hospitalization she had a CT renal study and that revealed bilateral nonobstructing renal calculi.  On the right there were 3 distinct calculi largest measuring 13 mm in the right renal pelvis.  On the left there was a single calculus in the upper pole measuring up to 6 mm.  .  Patient saw urologist Dr. Richardo Hanks on 10/18/2022 to discuss about management for the stones.  Different options were discussed and finally she chose to take potassium citrate 30 mEq twice daily for 1 month to see whether that will dissolve the stone. She has a follow-up appointment with them for a month And she has to get weekly potassium She has been referred to me because a repeat urine culture has been positive for ESBL Klebsiella on 10/26/2022.  She got a dose of fosfomycin on 10/27/2022. She continues to have dysuria She has no fever Occasionally gets right flank pain No abdominal pain No hematuria She has limited mobility because of the severe lymphedema She is pretty much wheelchair-bound Her husband helps her with her personal hygiene    Past Surgical History:  Procedure Laterality Date   ABDOMINAL HYSTERECTOMY     x2   BREAST BIOPSY Right 1990s   neg   BREAST BIOPSY Right 2000s   neg   BREAST BIOPSY Left 11/24/2020   u/s bx 10:30/8 cmfn-"heart" marker   BREAST EXCISIONAL BIOPSY Right    neg   BREAST LUMPECTOMY     BREAST LUMPECTOMY WITH RADIOFREQUENCY TAG  IDENTIFICATION Left 12/10/2020   Procedure: BREAST LUMPECTOMY WITH RADIOFREQUENCY TAG IDENTIFICATION;  Surgeon: Campbell Lerner, MD;  Location: [facility] ORS;  Service: General;  Laterality: Left;   DILATION AND CURETTAGE OF UTERUS     EYE SURGERY  2004   cataract surgery and laser surgery   Salpingo-oophorectomy      Social History   Socioeconomic History   Marital status: Married    Spouse name: Not on file   Number of children: Not on file   Years of education: Not on file   Highest education level: Not on file  Occupational History   Occupation: retired  Tobacco Use   Smoking status: Never    Passive exposure: Never   Smokeless tobacco: Never  Vaping Use   Vaping status: Never Used  Substance and Sexual Activity   Alcohol use: Not Currently   Drug use: Never   Sexual activity: Not Currently  Other Topics Concern   Not on file  Social History Narrative   Not on file   Social Determinants of Health   Financial Resource Strain: Low Risk  (10/26/2021)   Overall Financial Resource Strain (CARDIA)    Difficulty of Paying Living Expenses: Not hard at all  Food Insecurity: No Food Insecurity (09/30/2022)   Hunger Vital Sign    Worried About Running Out of Food in the Last Year: Never true    Ran Out of Food in the Last Year: Never true  Transportation Needs: No Transportation Needs (09/30/2022)   PRAPARE - Administrator, Civil Service (Medical): No    Lack of Transportation (Non-Medical): No  Physical Activity: Inactive (10/26/2021)   Exercise Vital Sign    Days of Exercise per Week: 0 days    Minutes of Exercise per Session: 0 min  Stress: No Stress Concern Present (10/26/2021)   [employer] of Occupational Health - Occupational Stress Questionnaire    Feeling of Stress : Not at all  Social Connections: Moderately Isolated (10/26/2021)   Social Connection and Isolation Panel [NHANES]    Frequency of Communication with Friends and Family: More than three  times a week    Frequency of Social Gatherings with Friends and Family: More than three times a week    Attends Religious Services: Never    Database administrator or Organizations: No    Attends Banker Meetings: Never    Marital Status: Married  Catering manager Violence: Not At Risk (09/25/2022)   Humiliation, Afraid, Rape, and Kick questionnaire    Fear of Current or Ex-Partner: No    Emotionally Abused: No    Physically Abused: No    Sexually Abused: No    Family History  Problem Relation Age of Onset   Breast cancer Mother 17   Heart disease Father    Cancer Paternal Grandfather    Heart disease Paternal Grandfather    Allergies  Allergen Reactions   Other Anaphylaxis    Honey Dew Melon   Wound Dressing Adhesive Rash   Ciprofloxacin     Back spasms   Morphine And Codeine Other (See Comments)    Hypotension   Latex Rash   I? Current Outpatient Medications  Medication Sig Dispense Refill   acetaminophen (TYLENOL) 650 MG CR tablet Take 650 mg by mouth every 8 (eight) hours as needed for pain.     aspirin 81 MG tablet Take 81 mg by mouth daily.     Calcium Citrate-Vitamin D (CALCIUM CITRATE + D3 PO) Take 1,200 mg by mouth daily.     Cholecalciferol (VITAMIN D3) 125 MCG (5000 UT) TABS Take 5,000 Units by mouth daily.     letrozole (FEMARA) 2.5 MG tablet Take 1 tablet (2.5 mg total) by mouth daily. 90 tablet 1   losartan-hydrochlorothiazide (HYZAAR) 50-12.5 MG tablet Take 1 tablet by mouth daily. 90 tablet 1   lovastatin (MEVACOR) 20 MG tablet TAKE 1 TABLET BY MOUTH EVERYDAY AT BEDTIME 90 tablet 1   Menthol, Topical Analgesic, (ICY HOT EX) Apply 1 application topically daily as needed (pain).     metFORMIN (GLUCOPHAGE-XR) 500 MG 24 hr tablet Take 2 tablets (1,000 mg total) by mouth 2 (two) times daily with a meal. 360 tablet 1   naloxone (NARCAN) nasal spray 4 mg/0.1 mL CALL 911. SPR CONTENTS OF ONE SPRAYER (0.1ML) INTO ONE NOSTRIL. REPEAT IN 2-3 MIN IF SYMPTOMS  OF OPIOID EMERGENCY PERSIST, ALTERNATE NOSTRILS     Omega-3 Fatty Acids (FISH OIL ULTRA) 1400 MG CAPS Take 1,400 mg by mouth daily.     Potassium Citrate 15 MEQ (1620 MG) TBCR Take 2 tablets by mouth 2 (two) times daily. 120 tablet 1   spironolactone (ALDACTONE) 50 MG tablet Take 1 tablet (50 mg total) by mouth daily. 90 tablet 1   No current facility-administered medications for this visit.     Abtx:  Anti-infectives (From admission, onward)    None       REVIEW OF SYSTEMS:  Const: negative fever, negative chills, negative weight loss Eyes: negative diplopia or visual changes, negative eye pain ENT: negative coryza, negative sore throat Resp: negative cough, hemoptysis, dyspnea Cards: negative for chest pain, palpitations, lower extremity edema GU: as above GI: Negative for abdominal pain, diarrhea, bleeding, constipation Skin: negative for rash and pruritus Heme: negative for easy bruising and gum/nose bleeding MS: wheel chair Neurolo:negative for headaches, dizziness, vertigo, memory problems  Psych: negative for feelings of anxiety, depression  Endocrine: , diabetes Allergy/Immunology- as above ? Objective:  VITALS:  BP (!) 143/83   Pulse 92   Temp (!) 96.6 F (35.9 C) (Temporal)   PHYSICAL EXAM:  General: Alert, cooperative, no distress, in wheel chair. Increased BMI Head: Normocephalic, without obvious abnormality, atraumatic. Eyes: Conjunctivae clear, anicteric sclerae. Pupils are equal ENT Nares normal. No drainage or sinus tenderness. Lips, mucosa, and tongue normal. No Thrush Neck: Supple, symmetrical, no adenopathy, thyroid: non tender no carotid bruit and no JVD. Back: No CVA tenderness. Lungs: Clear to auscultation bilaterally. No Wheezing or Rhonchi. No rales. Heart: Regular rate and rhythm, no murmur, rub or gallop. Abdomen: did not examine Extremities: b/l lymphedema Skin: No rashes or lesions. Or bruising Lymph: Cervical, supraclavicular  normal. Neurologic: Grossly non-focal Pertinent Labs Lab Results CBC    Component Value Date/Time   WBC 7.3 11/01/2022 1044   WBC 7.0 09/29/2022 0516   RBC 5.09 11/01/2022 1044   RBC 5.13 (H) 09/29/2022 0516   HGB 15.4 11/01/2022 1044   HCT 45.7 11/01/2022 1044   PLT 231 11/01/2022 1044   MCV 90 11/01/2022 1044   MCH 30.3 11/01/2022 1044   MCH 29.8 09/29/2022 0516   MCHC 33.7 11/01/2022 1044   MCHC 31.5 09/29/2022 0516   RDW 13.2 11/01/2022 1044   LYMPHSABS 2.2 11/01/2022 1044   MONOABS 0.5 09/29/2022 0516   EOSABS 0.3 11/01/2022 1044   BASOSABS 0.0 11/01/2022 1044       Latest Ref Rng & Units 11/01/2022   10:44 AM 10/26/2022    3:23 PM 10/06/2022   10:57 AM  CMP  Glucose 70 - 99 mg/dL 161   096   BUN 8 - 27 mg/dL 32   27   Creatinine 0.45 - 1.00 mg/dL 4.09   8.11   Sodium 914 - 144 mmol/L 138   138   Potassium 3.5 - 5.2 mmol/L 4.9  4.4  4.8   Chloride 96 - 106 mmol/L 97   96   CO2 20 - 29 mmol/L 25   24   Calcium 8.7 - 10.3 mg/dL 78.2   95.6   Total Protein 6.0 - 8.5 g/dL 7.2   7.2   Total Bilirubin 0.0 - 1.2 mg/dL 0.4   0.4   Alkaline Phos 44 - 121 IU/L 79   69   AST 0 - 40 IU/L 21   20   ALT 0 - 32 IU/L 30   21       Microbiology: Recent Results (from the past 240 hour(s))  Microscopic Examination     Status: Abnormal   Collection Time: 11/03/22  6:48 AM   BLD  Result Value Ref Range Status   WBC, UA 6-10 (A) 0 - 5 /hpf Final   RBC, Urine >30R 0 - 2 /hpf Final   Epithelial Cells (non renal) 0-10 0 - 10 /hpf Final   Bacteria, UA Many (A) None seen/Few Final  Urine Culture     Status: Abnormal (Preliminary result)   Collection Time: 11/03/22  8:49 AM   Specimen: Blood   UR  Result Value Ref Range Status   Urine Culture, Routine Preliminary report (A)  Preliminary   Organism ID, Bacteria Gram negative rods (A)  Preliminary    Comment: Greater than 100,000 colony forming units per mL    ? Impression/Recommendation ?Persistent symptoms of dysuria and  frequency inspite of recurrent courses of antibiotic- This is due to genitourinary syndrome of menopause compounded by Letrozole,  She has been treated as UTI multiple times Discussed with Dr.Rao her oncologist - okay to give estrace topical treatment- her PCP will prescribe  ESBL klebsiella in urine culture repeatedly inspite of IV meropenem and po fosfomycin She is colonized with it This is due to increased BMI, poor mobility, inability to perform proper hygiene and also inability to collect a clean specimen  B/l non obstructing renal stones-sometimes that stones can be colonized with ESBL kleb- treatment is not antibiotics but removal of stones- she is getting potassium citrate to try and dissolve the stones  Recommend the following  Not to check urine on a routine basis Not to treat with antibiotics for dysuria but only for systemic symptoms, hematuria, before cystoscopy  Pt to follow the below recommendations  *Estrace /Premarin cream topically- peasized apply topically three times a week * Cetaphil to clean the genital area ( not soap)  * Avoid cranberry supplement while on potassium citrate to reduce stone *wash with water after bowel movt *Probiotic for vaginal health( can try Pearls vaginal health) * Increase water consumption- 8 glasses a day *Avoid antibiotics unless systemic infection/ or before cystoscopy Avoid constipation * Kegel Exercise to strengthen pelvic floor  ? ?Follow PRN ___________________________________________________ Discussed with patient and her husband in detail. Also discussed with  PCP, oncologist and urologist Dr.Sninsky  Note:  This document was prepared using Dragon voice recognition software and may include unintentional dictation errors.

## 2022-11-09 NOTE — Patient Instructions (Addendum)
*  Estrace /Premarin cream topically- peasized apply topically three times a week * Cetaphil to clean the genital area ( not soap)  * Cranberry supplement (-Knudsen cranberry concentrate- 1 ounce mixed with 8 ounces of water *wash with water after bowel movt *Probiotic for vaginal health( can try Pearls vaginal health) * Increase water consumption- 8 glasses a day *Ask your doctors not to check your urine on a routine basis  *Avoid antibiotics unless systemic infection/ or before cystoscopy Avoid constipation * Kegel Exercise to strengthen pelvic floor

## 2022-11-09 NOTE — Progress Notes (Incomplete)
NAME: Sharon Ware  DOB: Jun 04, 1950  MRN: [id number]  Date/Time: 11/09/2022 8:56 AM  Subjective:    is here with her husband Sharon Ware is a 72 y.o. with a history of Hypertension, hyperlipidemia, diabetes mellitus, Has had chronic dysuria, frequency . Pt has repeated urine culture positive for ESBl organisms She was recently in  [facility] between 09/25/2022 until 09/29/2022 for dysuria and treated as a urinary tract infection due to ESBL Klebsiella with 5 days of meropenem Patient felt better for a day and then the symptoms returned.  During that hospitalization she had a CT renal study and that revealed bilateral nonobstructing renal calculi.  On the right there were 3 distinct calculi largest measuring 13 mm in the right renal pelvis.  On the left there was a single calculus in the upper pole measuring up to 6 mm.  .  Patient saw urologist Dr. Richardo Hanks on 10/18/2022 to discuss about management for the stones.  Different options were discussed and finally she chose to take potassium citrate 30 mEq twice daily for 1 month to see whether that will dissolve the stone. She has a follow-up appointment with them for a month And she has to get weekly potassium She has been referred to me because a repeat urine culture has been positive for ESBL Klebsiella on 10/26/2022.  She got a dose of fosfomycin on 10/27/2022. She continues to have dysuria She has no fever Occasionally gets right flank pain No abdominal pain No hematuria She has limited mobility because of the severe lymphedema She is pretty much wheelchair-bound Her husband helps her with her personal hygiene    Past Surgical History:  Procedure Laterality Date  . ABDOMINAL HYSTERECTOMY     x2  . BREAST BIOPSY Right 1990s   neg  . BREAST BIOPSY Right 2000s   neg  . BREAST BIOPSY Left 11/24/2020   u/s bx 10:30/8 cmfn-"heart" marker  . BREAST EXCISIONAL BIOPSY Right    neg  . BREAST LUMPECTOMY    . BREAST LUMPECTOMY WITH  RADIOFREQUENCY TAG IDENTIFICATION Left 12/10/2020   Procedure: BREAST LUMPECTOMY WITH RADIOFREQUENCY TAG IDENTIFICATION;  Surgeon: Campbell Lerner, MD;  Location: [facility] ORS;  Service: General;  Laterality: Left;  . DILATION AND CURETTAGE OF UTERUS    . EYE SURGERY  2004   cataract surgery and laser surgery  . Salpingo-oophorectomy      Social History   Socioeconomic History  . Marital status: Married    Spouse name: Not on file  . Number of children: Not on file  . Years of education: Not on file  . Highest education level: Not on file  Occupational History  . Occupation: retired  Tobacco Use  . Smoking status: Never    Passive exposure: Never  . Smokeless tobacco: Never  Vaping Use  . Vaping status: Never Used  Substance and Sexual Activity  . Alcohol use: Not Currently  . Drug use: Never  . Sexual activity: Not Currently  Other Topics Concern  . Not on file  Social History Narrative  . Not on file   Social Determinants of Health   Financial Resource Strain: Low Risk  (10/26/2021)   Overall Financial Resource Strain (CARDIA)   . Difficulty of Paying Living Expenses: Not hard at all  Food Insecurity: No Food Insecurity (09/30/2022)   Hunger Vital Sign   . Worried About Programme researcher, broadcasting/film/video in the Last Year: Never true   . Ran Out of Food in the Last Year: Never  true  Transportation Needs: No Transportation Needs (09/30/2022)   PRAPARE - Transportation   . Lack of Transportation (Medical): No   . Lack of Transportation (Non-Medical): No  Physical Activity: Inactive (10/26/2021)   Exercise Vital Sign   . Days of Exercise per Week: 0 days   . Minutes of Exercise per Session: 0 min  Stress: No Stress Concern Present (10/26/2021)   [employer] of Occupational Health - Occupational Stress Questionnaire   . Feeling of Stress : Not at all  Social Connections: Moderately Isolated (10/26/2021)   Social Connection and Isolation Panel [NHANES]   . Frequency of Communication  with Friends and Family: More than three times a week   . Frequency of Social Gatherings with Friends and Family: More than three times a week   . Attends Religious Services: Never   . Active Member of Clubs or Organizations: No   . Attends Banker Meetings: Never   . Marital Status: Married  Catering manager Violence: Not At Risk (09/25/2022)   Humiliation, Afraid, Rape, and Kick questionnaire   . Fear of Current or Ex-Partner: No   . Emotionally Abused: No   . Physically Abused: No   . Sexually Abused: No    Family History  Problem Relation Age of Onset  . Breast cancer Mother 49  . Heart disease Father   . Cancer Paternal Grandfather   . Heart disease Paternal Grandfather    Allergies  Allergen Reactions  . Other Anaphylaxis    Honey Dew Melon  . Wound Dressing Adhesive Rash  . Ciprofloxacin     Back spasms  . Morphine And Codeine Other (See Comments)    Hypotension  . Latex Rash   I  Current Outpatient Medications  Medication Sig Dispense Refill  . acetaminophen (TYLENOL) 650 MG CR tablet Take 650 mg by mouth every 8 (eight) hours as needed for pain.    Marland Kitchen aspirin 81 MG tablet Take 81 mg by mouth daily.    . Calcium Citrate-Vitamin D (CALCIUM CITRATE + D3 PO) Take 1,200 mg by mouth daily.    . Cholecalciferol (VITAMIN D3) 125 MCG (5000 UT) TABS Take 5,000 Units by mouth daily.    Marland Kitchen letrozole (FEMARA) 2.5 MG tablet Take 1 tablet (2.5 mg total) by mouth daily. 90 tablet 1  . losartan-hydrochlorothiazide (HYZAAR) 50-12.5 MG tablet Take 1 tablet by mouth daily. 90 tablet 1  . lovastatin (MEVACOR) 20 MG tablet TAKE 1 TABLET BY MOUTH EVERYDAY AT BEDTIME 90 tablet 1  . Menthol, Topical Analgesic, (ICY HOT EX) Apply 1 application topically daily as needed (pain).    . metFORMIN (GLUCOPHAGE-XR) 500 MG 24 hr tablet Take 2 tablets (1,000 mg total) by mouth 2 (two) times daily with a meal. 360 tablet 1  . naloxone (NARCAN) nasal spray 4 mg/0.1 mL CALL 911. SPR CONTENTS  OF ONE SPRAYER (0.1ML) INTO ONE NOSTRIL. REPEAT IN 2-3 MIN IF SYMPTOMS OF OPIOID EMERGENCY PERSIST, ALTERNATE NOSTRILS    . Omega-3 Fatty Acids (FISH OIL ULTRA) 1400 MG CAPS Take 1,400 mg by mouth daily.    . Potassium Citrate 15 MEQ (1620 MG) TBCR Take 2 tablets by mouth 2 (two) times daily. 120 tablet 1  . spironolactone (ALDACTONE) 50 MG tablet Take 1 tablet (50 mg total) by mouth daily. 90 tablet 1   No current facility-administered medications for this visit.     Abtx:  Anti-infectives (From admission, onward)    None       REVIEW  OF SYSTEMS:  Const: negative fever, negative chills, negative weight loss Eyes: negative diplopia or visual changes, negative eye pain ENT: negative coryza, negative sore throat Resp: negative cough, hemoptysis, dyspnea Cards: negative for chest pain, palpitations, lower extremity edema GU: as above GI: Negative for abdominal pain, diarrhea, bleeding, constipation Skin: negative for rash and pruritus Heme: negative for easy bruising and gum/nose bleeding MS: wheel chair Neurolo:negative for headaches, dizziness, vertigo, memory problems  Psych: negative for feelings of anxiety, depression  Endocrine: , diabetes Allergy/Immunology- as above   Objective:  VITALS:  BP (!) 143/83   Pulse 92   Temp (!) 96.6 F (35.9 C) (Temporal)   PHYSICAL EXAM:  General: Alert, cooperative, no distress, in wheel chair. Increased BMI Head: Normocephalic, without obvious abnormality, atraumatic. Eyes: Conjunctivae clear, anicteric sclerae. Pupils are equal ENT Nares normal. No drainage or sinus tenderness. Lips, mucosa, and tongue normal. No Thrush Neck: Supple, symmetrical, no adenopathy, thyroid: non tender no carotid bruit and no JVD. Back: No CVA tenderness. Lungs: Clear to auscultation bilaterally. No Wheezing or Rhonchi. No rales. Heart: Regular rate and rhythm, no murmur, rub or gallop. Abdomen: did not examine Extremities: b/l lymphedema Skin: No  rashes or lesions. Or bruising Lymph: Cervical, supraclavicular normal. Neurologic: Grossly non-focal Pertinent Labs Lab Results CBC    Component Value Date/Time   WBC 7.3 11/01/2022 1044   WBC 7.0 09/29/2022 0516   RBC 5.09 11/01/2022 1044   RBC 5.13 (H) 09/29/2022 0516   HGB 15.4 11/01/2022 1044   HCT 45.7 11/01/2022 1044   PLT 231 11/01/2022 1044   MCV 90 11/01/2022 1044   MCH 30.3 11/01/2022 1044   MCH 29.8 09/29/2022 0516   MCHC 33.7 11/01/2022 1044   MCHC 31.5 09/29/2022 0516   RDW 13.2 11/01/2022 1044   LYMPHSABS 2.2 11/01/2022 1044   MONOABS 0.5 09/29/2022 0516   EOSABS 0.3 11/01/2022 1044   BASOSABS 0.0 11/01/2022 1044       Latest Ref Rng & Units 11/01/2022   10:44 AM 10/26/2022    3:23 PM 10/06/2022   10:57 AM  CMP  Glucose 70 - 99 mg/dL 161   096   BUN 8 - 27 mg/dL 32   27   Creatinine 0.45 - 1.00 mg/dL 4.09   8.11   Sodium 914 - 144 mmol/L 138   138   Potassium 3.5 - 5.2 mmol/L 4.9  4.4  4.8   Chloride 96 - 106 mmol/L 97   96   CO2 20 - 29 mmol/L 25   24   Calcium 8.7 - 10.3 mg/dL 78.2   95.6   Total Protein 6.0 - 8.5 g/dL 7.2   7.2   Total Bilirubin 0.0 - 1.2 mg/dL 0.4   0.4   Alkaline Phos 44 - 121 IU/L 79   69   AST 0 - 40 IU/L 21   20   ALT 0 - 32 IU/L 30   21       Microbiology: Recent Results (from the past 240 hour(s))  Microscopic Examination     Status: Abnormal   Collection Time: 11/03/22  6:48 AM   BLD  Result Value Ref Range Status   WBC, UA 6-10 (A) 0 - 5 /hpf Final   RBC, Urine >30R 0 - 2 /hpf Final   Epithelial Cells (non renal) 0-10 0 - 10 /hpf Final   Bacteria, UA Many (A) None seen/Few Final  Urine Culture     Status: Abnormal (Preliminary result)  Collection Time: 11/03/22  8:49 AM   Specimen: Blood   UR  Result Value Ref Range Status   Urine Culture, Routine Preliminary report (A)  Preliminary   Organism ID, Bacteria Gram negative rods (A)  Preliminary    Comment: Greater than 100,000 colony forming units per mL       Impression/Recommendation  Persistent symptoms of dysuria and frequency inspite of recurrent courses of antibiotic- This is due to genitourinary syndrome of menopause compounded by Letrozole,  She has been treated as UTI multiple times Discussed with Dr.Rao her oncologist - okay to give estrace topical treatment- her PCP will prescribe  ESBL klebsiella in urine culture repeatedly inspite of IV meropenem and po fosfomycin She is colonized with it This is due to increased BMI, poor mobility, inability to perform proper hygiene and also inability to collect a clean specimen  Recommend       ___________________________________________________ Discussed with patient, requesting provider Note:  This document was prepared using Dragon voice recognition software and may include unintentional dictation errors.

## 2022-11-10 ENCOUNTER — Encounter: Payer: Self-pay | Admitting: Occupational Therapy

## 2022-11-10 ENCOUNTER — Encounter: Payer: Medicare Other | Admitting: Occupational Therapy

## 2022-11-10 NOTE — Therapy (Signed)
OUTPATIENT OCCUPATIONAL THERAPY TREATMENT NOTE AND PROGRESS REPORT  BILATERAL LOWER EXTREMITY LYMPHEDEMA  Patient Name: Sharon Ware MRN: [id number] DOB:[birth year], 72 y.o., female Today's Date: 11/10/2022  REPORTING PERIOD: 09/07/22 - 11/09/22  END OF SESSION:   OT End of Session - 11/09/22 1513     Visit Number 10    Number of Visits 36    Date for OT Re-Evaluation 12/06/22    OT Start Time 0305    OT Stop Time 0405    OT Time Calculation (min) 60 min    Activity Tolerance Patient tolerated treatment well;No increased pain    Behavior During Therapy WFL for tasks assessed/performed               Past Medical History:  Diagnosis Date   Arthritis    Breast cancer (HCC)    Cataract    Diabetes mellitus without complication (HCC)    Hyperlipidemia    Hypertension    Lymphedema    Vitamin D deficiency    Past Surgical History:  Procedure Laterality Date   ABDOMINAL HYSTERECTOMY     x2   BREAST BIOPSY Right 1990s   neg   BREAST BIOPSY Right 2000s   neg   BREAST BIOPSY Left 11/24/2020   u/s bx 10:30/8 cmfn-"heart" marker   BREAST EXCISIONAL BIOPSY Right    neg   BREAST LUMPECTOMY     BREAST LUMPECTOMY WITH RADIOFREQUENCY TAG IDENTIFICATION Left 12/10/2020   Procedure: BREAST LUMPECTOMY WITH RADIOFREQUENCY TAG IDENTIFICATION;  Surgeon: Campbell Lerner, MD;  Location: [facility] ORS;  Service: General;  Laterality: Left;   DILATION AND CURETTAGE OF UTERUS     EYE SURGERY  2004   cataract surgery and laser surgery   Salpingo-oophorectomy     Patient Active Problem List   Diagnosis Date Noted   Acute cystitis without hematuria 09/28/2022   Urinary tract infection due to ESBL Klebsiella 09/25/2022   AKI (acute kidney injury) (HCC) 09/25/2022   Lymphedema 09/25/2022   Hypoxia 09/25/2022   Cellulitis 08/04/2022   Edema 01/30/2021   Malignant neoplasm of upper-inner quadrant of left breast in female, estrogen receptor positive (HCC) 12/01/2020   Goals of care,  counseling/discussion 12/01/2020   Morbid obesity (HCC) 08/08/2018   BMI 60.0-69.9, adult (HCC) 08/08/2018   Controlled diabetes mellitus type 2 with complications (HCC) 08/08/2018   Hyperlipidemia 08/08/2018   Hypertension associated with diabetes (HCC) 08/08/2018   Arthritis of both knees 08/08/2018   Vitamin D deficiency 08/08/2018   PCP: Olevia Perches, DO  REFERRING PROVIDER: Gwynn Burly, DO  REFERRING DIAG: I89.0  THERAPY DIAG:  Lymphedema, not elsewhere classified [I89.0]  Rationale for Evaluation and Treatment: Rehabilitation  ONSET DATE: 1 yr ago without known precipitating event- by report  SUBJECTIVE:  SUBJECTIVE STATEMENT: Sharon Ware presents for OT treatment to address BLE lymphedema, L>R. Pt is accompanied by her spouse, [name], who assists her with transfers and with compression bandaging at home between visits. Pt denies LE-related leg pain. She arrives seated in a transport wheelchair w complaints of kidney stone. Pain is not rated.  Pt completed SPT with Max A from spouse.   Tobacco Use: Low Risk  (11/10/2022)   Patient History    Smoking Tobacco Use: Never    Smokeless Tobacco Use: Never    Passive Exposure: Never   PERTINENT HISTORY: HTN, Obesity w BMI 60-69.9, DM, B knee arthritis, Hx L breast cancer, recurrent L leg cellulitis. MRSA test results pending  PAIN: Are you having pain? Denies LE related pain  PRECAUTIONS: Fall and Other: lymphedema precautions, DM. Hx L Br Ca  PRIOR LEVEL OF FUNCTION: Independent with household mobility with device, Requires assistive device for independence, Needs assistance with homemaking, Needs assistance with gait, and Needs assistance with transfers  PATIENT GOALS: ... Get improvement for my condition...., keep lymphedema from getting  worse, and reduce infection risk"  LYMPHEDEMA ASSESSMENTS:  FOTO Functional Outcomes Measure: Initial 33%  Lymphedema Life Impact Scale (LLIS): 22.06%  BLE COMPARATIVE LIMB VOLUMETRICS Initial 09/14/22  LANDMARK RIGHT    R LEG (A-D) 7717.1 ml  R THIGH (E-G) ml  R FULL LIMB (A-G) ml  Limb Volume differential (LVD)  %  Volume change since initial %  Volume change overall V  (Blank rows = not tested)  LANDMARK LEFT   L LEG (A-D) 7752.1 ml  L THIGH (E-G) ml  L FULL LIMB (A-G) ml  Limb Volume differential (LVD)  Leg LVD measures 0.5%, L>R   Volume change since initial %  Volume change overall %   (Blank rows = not tested)    BLE COMPARATIVE LIMB VOLUMETRICS 9th visit 11/05/22  LANDMARK LEFT   L LEG (A-D) 5445.7 ml  L THIGH (E-G) ml  L FULL LIMB (A-G) ml  Limb Volume differential (LVD)    Volume change since initial  L LEG reduced in volume by 29.43% since 09/14/22  Volume change overall %  (Blank rows = not tested)   TODAY'S TREATMENT:                                                                                                                                         Measurements for LLE custom compression knee high and HOS device Knee length LLE multilayer compression wraps- as established Pt edu for compression garments options, recommendations, wear and care schedules, precautions  PATIENT EDUCATION:  Continued Pt/ CG edu for lymphedema self care home program throughout session. Topics include outcome of comparative limb volumetrics- starting limb volume differentials (LVDs), technology and gradient techniques used for short stretch, multilayer compression wrapping, simple self-MLD, therapeutic lymphatic pumping exercises, skin/nail care, LE precautions,. compression garment recommendations and specifications, wear  and care schedule and compression garment donning / doffing w assistive devices. Discussed progress towards all OT goals since commencing CDT. All questions  answered to the Pt's satisfaction. Good return. Person educated: Patient and Spouse Education method: Explanation, Demonstration, and Handouts Education comprehension: verbalized understanding, returned demonstration, verbal cues required, and needs further education  Lymphedema HOME PROGRAM:Complete Decongestive Therapy (CDT) Self-Management Phase Daily simple self-MLD Daily BLE skin care and inspection Daily lymphatic pumping there ex Appropriate daily compression- knee length, will require max caregiver A to  and doff Leg elevation when seated  ASSESSMENT: CLINICAL IMPRESSION:  Pt demonstrates excellent progress towards all OT goals for lymphedema care. LLE comparative limb volumetrics from last visit 9 reveal L LEG is reduced in volume by 29.43% since 09/14/22.This value meets and exceeds the initial 10% limb volume reduction goal for the L Leg. Please see GOALS sections for additional details. Session devoted to completing [employer], LLE compression garment measurements, determining options and recommendations. Faxed to DME vendor after session. Applied clean multilayer compression wraps to L LEG below the knee. Cont as per POC. Pt demonstrates excellent progress towards goals with her spouse's assistance.Cont as per POC.   OBJECTIVE IMPAIRMENTS: Abnormal gait, decreased activity tolerance, decreased balance, decreased endurance, decreased knowledge of condition, decreased knowledge of use of DME, decreased mobility, difficulty walking, decreased ROM, decreased strength, increased edema, impaired perceived functional ability, postural dysfunction, obesity, pain, and chronic progressive limb swelling with elevated infection risk and risk of non-healing wounds .   ACTIVITY LIMITATIONS: carrying, lifting, bending, sitting, standing, squatting, sleeping, stairs, transfers, bed mobility, bathing, toileting, dressing, hygiene/grooming, and caring for others  PARTICIPATION LIMITATIONS: meal prep,  cleaning, laundry, driving, shopping, community activity, occupation, yard work, and church  PERSONAL FACTORS: 3+ comorbidities: HTN, Stage III Obesity, B knee arthritis pain and inflammation and impaired AROM 2/2 body habitus  are also affecting patient's functional outcome.   REHAB POTENTIAL:  Fair with daily CG assistance throughout Intensive Phase CDT.  Poor without daily CG assistance with all LE self care home program components  GOALS: Goals reviewed with patient? Yes  SHORT TERM GOALS: Target date: 4th OT Rx visit   Pt will demonstrate understanding of lymphedema precautions and prevention strategies with modified independence using a printed reference to identify at least 5 precautions and discussing how s/he may implement them into daily life to reduce risk of progression with modified assistance ( printed reference). Baseline: modified independence ( printed reference) Goal status: 11/05/22 GOAL MET  2.  Pt will be able to apply multilayer, thigh length, compression wraps using gradient techniques with Max caregiver assistance to decrease limb volume, to limit infection risk, and to limit lymphedema progression.  Baseline: Max A Goal status: 11/05/22 GOAL MET  LONG TERM GOALS: Target date: 12/06/22  Given this patient's Intake score of 22.06 % on the Lymphedema Life Impact Scale (LLIS), patient will experience a reduction of at least 5 points in her perceived level of functional impairment resulting from lymphedema to improve functional performance and quality of life (QOL). Baseline: 22.06 % Goal status ONGOING-PROGRESSING  2.  Given this patient's Intake score of 33 % on the functional outcomes FOTO tool, patient will experience an increase in function of 3 points to improve basic and instrumental ADLs performance, including lymphedema self-care.  Baseline: 33 % Goal status: ONGOING-PROGRESSING  3.  During Intensive phase CDT Pt will achieve at least 85% compliance with all  lymphedema self-care home program components, including  daily skin care, multilayer ,  gradient compression wraps with daily changes, daily simple self MLD and daily lymphatic pumping therex to achieve optimal clinical outcome and to habituate self care regime for optimal LE self-management over time. Baseline: Dependent Goal status:11/05/22 GOAL MET  4.  Pt will achieve at least a 10% volume reductions bilaterally below the knees to return limb to more typical size and shape, to limit infection risk and LE progression, to decrease pain, to improve function, and to improve body image and QOL. Baseline: Dependent Goal status:11/05/22 GOAL for L LEG w 29% reduction.  5.  Pt will be able to don and doff appropriate compression garments and devices using assistive devices and extra time and Max CG assistance within 1 week of issue date for optimal lymphedema self-care. Baseline: Dependent Goal status: ONGOING PLAN:  PT FREQUENCY: 2 x/week  PT DURATION: 12  weeks other: and PRN  PLANNED INTERVENTIONS: Therapeutic exercises, Therapeutic activity, Patient/Family education, Self Care, DME instructions, Manual lymph drainage, Compression bandaging, Manual therapy, and skin care during MLD, fit with appropriate custom compression garments and devices, fit with appropriate compression device  which  follows lymphatic pathways and anatomic distribution  PLAN FOR NEXT SESSION:  LLE bandages Pt/family edu for compression Anatomical measurements for garments  Loel Dubonnet, MS, OTR/L, CLT-LANA 11/10/22 7:57 AM

## 2022-11-11 ENCOUNTER — Other Ambulatory Visit: Payer: Medicare Other

## 2022-11-12 ENCOUNTER — Encounter: Payer: Medicare Other | Admitting: Occupational Therapy

## 2022-11-12 ENCOUNTER — Ambulatory Visit: Payer: Medicare Other | Admitting: Occupational Therapy

## 2022-11-12 DIAGNOSIS — I890 Lymphedema, not elsewhere classified: Secondary | ICD-10-CM

## 2022-11-12 NOTE — Therapy (Signed)
OUTPATIENT OCCUPATIONAL THERAPY TREATMENT NOTE   BILATERAL LOWER EXTREMITY LYMPHEDEMA  Patient Name: Sharon Ware MRN: [id number] DOB:03-04-1951, 72 y.o., female Today's Date: 11/12/2022  REPORTING PERIOD: 09/07/22 - 11/09/22  END OF SESSION:   OT End of Session - 11/12/22 1103     Visit Number 11    Number of Visits 36    Date for OT Re-Evaluation 12/06/22    OT Start Time 1103    OT Stop Time 1203    OT Time Calculation (min) 60 min    Activity Tolerance Patient tolerated treatment well;No increased pain    Behavior During Therapy WFL for tasks assessed/performed               Past Medical History:  Diagnosis Date   Arthritis    Breast cancer (HCC)    Cataract    Diabetes mellitus without complication (HCC)    Hyperlipidemia    Hypertension    Lymphedema    Vitamin D deficiency    Past Surgical History:  Procedure Laterality Date   ABDOMINAL HYSTERECTOMY     x2   BREAST BIOPSY Right 1990s   neg   BREAST BIOPSY Right 2000s   neg   BREAST BIOPSY Left 11/24/2020   u/s bx 10:30/8 cmfn-"heart" marker   BREAST EXCISIONAL BIOPSY Right    neg   BREAST LUMPECTOMY     BREAST LUMPECTOMY WITH RADIOFREQUENCY TAG IDENTIFICATION Left 12/10/2020   Procedure: BREAST LUMPECTOMY WITH RADIOFREQUENCY TAG IDENTIFICATION;  Surgeon: Campbell Lerner, MD;  Location: [facility] ORS;  Service: General;  Laterality: Left;   DILATION AND CURETTAGE OF UTERUS     EYE SURGERY  2004   cataract surgery and laser surgery   Salpingo-oophorectomy     Patient Active Problem List   Diagnosis Date Noted   Acute cystitis without hematuria 09/28/2022   Urinary tract infection due to ESBL Klebsiella 09/25/2022   AKI (acute kidney injury) (HCC) 09/25/2022   Lymphedema 09/25/2022   Hypoxia 09/25/2022   Cellulitis 08/04/2022   Edema 01/30/2021   Malignant neoplasm of upper-inner quadrant of left breast in female, estrogen receptor positive (HCC) 12/01/2020   Goals of care, counseling/discussion  12/01/2020   Morbid obesity (HCC) 08/08/2018   BMI 60.0-69.9, adult (HCC) 08/08/2018   Controlled diabetes mellitus type 2 with complications (HCC) 08/08/2018   Hyperlipidemia 08/08/2018   Hypertension associated with diabetes (HCC) 08/08/2018   Arthritis of both knees 08/08/2018   Vitamin D deficiency 08/08/2018   PCP: Olevia Perches, DO  REFERRING PROVIDER: Gwynn Burly, DO  REFERRING DIAG: I89.0  THERAPY DIAG:  Lymphedema, not elsewhere classified [I89.0]  Rationale for Evaluation and Treatment: Rehabilitation  ONSET DATE: 1 yr ago without known precipitating event- by report  SUBJECTIVE:  SUBJECTIVE STATEMENT: Sharon Ware presents for OT treatment to address BLE lymphedema, L>R. Pt is accompanied by her spouse, [name], who assists her with transfers and with compression bandaging at home between visits. Pt denies LE-related leg pain. She arrives seated in a transport wheelchair w ongoing complaints of kidney stone related pain. Pt completed SPT to Rx table with Max A from spouse.   Tobacco Use: Low Risk  (11/10/2022)   Patient History    Smoking Tobacco Use: Never    Smokeless Tobacco Use: Never    Passive Exposure: Never   PERTINENT HISTORY: HTN, Obesity w BMI 60-69.9, DM, B knee arthritis, Hx L breast cancer, recurrent L leg cellulitis. MRSA test results pending  PAIN: Are you having pain? Denies LE related pain  PRECAUTIONS: Fall and Other: lymphedema precautions, DM. Hx L Br Ca  PRIOR LEVEL OF FUNCTION: Independent with household mobility with device, Requires assistive device for independence, Needs assistance with homemaking, Needs assistance with gait, and Needs assistance with transfers  PATIENT GOALS: ... Get improvement for my condition...., keep lymphedema from getting worse, and  reduce infection risk"  LYMPHEDEMA ASSESSMENTS:  FOTO Functional Outcomes Measure: Initial 33%  Lymphedema Life Impact Scale (LLIS): 22.06%  BLE COMPARATIVE LIMB VOLUMETRICS Initial 09/14/22  LANDMARK RIGHT    R LEG (A-D) 7717.1 ml  R THIGH (E-G) ml  R FULL LIMB (A-G) ml  Limb Volume differential (LVD)  %  Volume change since initial %  Volume change overall V  (Blank rows = not tested)  LANDMARK LEFT   L LEG (A-D) 7752.1 ml  L THIGH (E-G) ml  L FULL LIMB (A-G) ml  Limb Volume differential (LVD)  Leg LVD measures 0.5%, L>R   Volume change since initial %  Volume change overall %   (Blank rows = not tested)    BLE COMPARATIVE LIMB VOLUMETRICS 9th visit 11/05/22  LANDMARK LEFT   L LEG (A-D) 5445.7 ml  L THIGH (E-G) ml  L FULL LIMB (A-G) ml  Limb Volume differential (LVD)    Volume change since initial  L LEG reduced in volume by 29.43% since 09/14/22  Volume change overall %  (Blank rows = not tested)   TODAY'S TREATMENT:                                                                                                                                         Knee length LLE multilayer compression wraps- as established MLD as established to LLE  PATIENT EDUCATION:  Continued Pt/ CG edu for lymphedema self care home program throughout session. Topics include outcome of comparative limb volumetrics- starting limb volume differentials (LVDs), technology and gradient techniques used for short stretch, multilayer compression wrapping, simple self-MLD, therapeutic lymphatic pumping exercises, skin/nail care, LE precautions,. compression garment recommendations and specifications, wear and care schedule and compression garment donning / doffing w assistive devices. Discussed progress towards all  OT goals since commencing CDT. All questions answered to the Pt's satisfaction. Good return. Person educated: Patient and Spouse Education method: Explanation, Demonstration, and  Handouts Education comprehension: verbalized understanding, returned demonstration, verbal cues required, and needs further education  Lymphedema HOME PROGRAM:Complete Decongestive Therapy (CDT) Self-Management Phase Daily simple self-MLD Daily BLE skin care and inspection Daily lymphatic pumping there ex Appropriate daily compression- knee length, will require max caregiver A to  and doff Leg elevation when seated  ASSESSMENT: CLINICAL IMPRESSION: Continued Pt and spouse education for simple self MLD for the J stroke and short neck sequence to the lateral neck and clavicular lymph nodes. Pt and spouse verbalized understanding that these areas are not treated in case of stroke or thyroid dysfunction. Pt tolerated MLD to LLE using short neck sequence, abdominal lymphatics and functional inguinal LN without obvious reduction in volume at end of session. Unable to apply compression wraps in clinic since they were left in the car. Spouse will assist with reapplying wraps at home. Cont as per POC.  OBJECTIVE IMPAIRMENTS: Abnormal gait, decreased activity tolerance, decreased balance, decreased endurance, decreased knowledge of condition, decreased knowledge of use of DME, decreased mobility, difficulty walking, decreased ROM, decreased strength, increased edema, impaired perceived functional ability, postural dysfunction, obesity, pain, and chronic progressive limb swelling with elevated infection risk and risk of non-healing wounds .   ACTIVITY LIMITATIONS: carrying, lifting, bending, sitting, standing, squatting, sleeping, stairs, transfers, bed mobility, bathing, toileting, dressing, hygiene/grooming, and caring for others  PARTICIPATION LIMITATIONS: meal prep, cleaning, laundry, driving, shopping, community activity, occupation, yard work, and church  PERSONAL FACTORS: 3+ comorbidities: HTN, Stage III Obesity, B knee arthritis pain and inflammation and impaired AROM 2/2 body habitus  are also  affecting patient's functional outcome.   REHAB POTENTIAL:  Fair with daily CG assistance throughout Intensive Phase CDT.  Poor without daily CG assistance with all LE self care home program components  GOALS: Goals reviewed with patient? Yes  SHORT TERM GOALS: Target date: 4th OT Rx visit   Pt will demonstrate understanding of lymphedema precautions and prevention strategies with modified independence using a printed reference to identify at least 5 precautions and discussing how s/he may implement them into daily life to reduce risk of progression with modified assistance ( printed reference). Baseline: modified independence ( printed reference) Goal status: 11/05/22 GOAL MET  2.  Pt will be able to apply multilayer, thigh length, compression wraps using gradient techniques with Max caregiver assistance to decrease limb volume, to limit infection risk, and to limit lymphedema progression.  Baseline: Max A Goal status: 11/05/22 GOAL MET  LONG TERM GOALS: Target date: 12/06/22  Given this patient's Intake score of 22.06 % on the Lymphedema Life Impact Scale (LLIS), patient will experience a reduction of at least 5 points in her perceived level of functional impairment resulting from lymphedema to improve functional performance and quality of life (QOL). Baseline: 22.06 % Goal status ONGOING-PROGRESSING  2.  Given this patient's Intake score of 33 % on the functional outcomes FOTO tool, patient will experience an increase in function of 3 points to improve basic and instrumental ADLs performance, including lymphedema self-care.  Baseline: 33 % Goal status: ONGOING-PROGRESSING  3.  During Intensive phase CDT Pt will achieve at least 85% compliance with all lymphedema self-care home program components, including  daily skin care, multilayer , gradient compression wraps with daily changes, daily simple self MLD and daily lymphatic pumping therex to achieve optimal clinical outcome and to  habituate self  care regime for optimal LE self-management over time. Baseline: Dependent Goal status:11/05/22 GOAL MET  4.  Pt will achieve at least a 10% volume reductions bilaterally below the knees to return limb to more typical size and shape, to limit infection risk and LE progression, to decrease pain, to improve function, and to improve body image and QOL. Baseline: Dependent Goal status:11/05/22 GOAL for L LEG w 29% reduction.  5.  Pt will be able to don and doff appropriate compression garments and devices using assistive devices and extra time and Max CG assistance within 1 week of issue date for optimal lymphedema self-care. Baseline: Dependent Goal status: ONGOING PLAN:  PT FREQUENCY: 2 x/week  PT DURATION: 12  weeks other: and PRN  PLANNED INTERVENTIONS: Therapeutic exercises, Therapeutic activity, Patient/Family education, Self Care, DME instructions, Manual lymph drainage, Compression bandaging, Manual therapy, and skin care during MLD, fit with appropriate custom compression garments and devices, fit with appropriate compression device  which  follows lymphatic pathways and anatomic distribution  PLAN FOR NEXT SESSION:  LLE bandages Pt/family edu for compression Anatomical measurements for garments  Loel Dubonnet, MS, OTR/L, CLT-LANA 11/12/22 12:07 PM

## 2022-11-13 ENCOUNTER — Ambulatory Visit: Payer: Medicare Other

## 2022-11-13 VITALS — BP 115/74 | HR 92 | Temp 99.2°F | Resp 16 | Ht 63.0 in | Wt 354.9 lb

## 2022-11-13 DIAGNOSIS — T148XXA Other injury of unspecified body region, initial encounter: Secondary | ICD-10-CM | POA: Diagnosis not present

## 2022-11-13 DIAGNOSIS — I890 Lymphedema, not elsewhere classified: Secondary | ICD-10-CM

## 2022-11-13 MED ORDER — MUPIROCIN 2 % EX OINT: 1.0000 | TOPICAL_OINTMENT | Freq: Two times a day (BID) | CUTANEOUS | 0 refills | Status: AC

## 2022-11-13 NOTE — ED Provider Notes (Signed)
[facility]    CSN: [id number] Arrival date & time: 11/13/22  1246      History   Chief Complaint Chief Complaint  Patient presents with   Blister    Appointment    [name] for Lymphedema treatment this morning. Since then, a bloody blister has formed in the treatment area of leg. - Entered by patient    HPI Sharon Ware is a 72 y.o. female.   72 year old female pt, Sharon, Ware to urgent care for evaluation of left lower leg" blister" patient states she was having her lymphatic massage and wrap done yesterday and she developed blister to the left lower leg per her husband.  Patient concerned he has a history of cellulitis and wants to get it checked denies any other symptoms.  The history is provided by the patient. No language interpreter was used.    Past Medical History:  Diagnosis Date   Arthritis    Breast cancer (HCC)    Cataract    Diabetes mellitus without complication (HCC)    Hyperlipidemia    Hypertension    Lymphedema    Vitamin D deficiency     Patient Active Problem List   Diagnosis Date Noted   Abrasion 11/13/2022   Acute cystitis without hematuria 09/28/2022   Urinary tract infection due to ESBL Klebsiella 09/25/2022   AKI (acute kidney injury) (HCC) 09/25/2022   Lymphedema 09/25/2022   Hypoxia 09/25/2022   Cellulitis 08/04/2022   Edema 01/30/2021   Malignant neoplasm of upper-inner quadrant of left breast in female, estrogen receptor positive (HCC) 12/01/2020   Goals of care, counseling/discussion 12/01/2020   Morbid obesity (HCC) 08/08/2018   BMI 60.0-69.9, adult (HCC) 08/08/2018   Controlled diabetes mellitus type 2 with complications (HCC) 08/08/2018   Hyperlipidemia 08/08/2018   Hypertension associated with diabetes (HCC) 08/08/2018   Arthritis of both knees 08/08/2018   Vitamin D deficiency 08/08/2018    Past Surgical History:  Procedure Laterality Date   ABDOMINAL HYSTERECTOMY     x2   BREAST BIOPSY Right  1990s   neg   BREAST BIOPSY Right 2000s   neg   BREAST BIOPSY Left 11/24/2020   u/s bx 10:30/8 cmfn-"heart" marker   BREAST EXCISIONAL BIOPSY Right    neg   BREAST LUMPECTOMY     BREAST LUMPECTOMY WITH RADIOFREQUENCY TAG IDENTIFICATION Left 12/10/2020   Procedure: BREAST LUMPECTOMY WITH RADIOFREQUENCY TAG IDENTIFICATION;  Surgeon: Campbell Lerner, MD;  Location: [facility] ORS;  Service: General;  Laterality: Left;   DILATION AND CURETTAGE OF UTERUS     EYE SURGERY  2004   cataract surgery and laser surgery   Salpingo-oophorectomy      OB History   No obstetric history on file.      Home Medications    Prior to Admission medications   Medication Sig Start Date End Date Taking? Authorizing Provider  mupirocin ointment (BACTROBAN) 2 % Apply 1 Application topically 2 (two) times daily for 7 days. Apply to lower left leg 11/13/22 11/20/22 Yes , Para March, NP  acetaminophen (TYLENOL) 650 MG CR tablet Take 650 mg by mouth every 8 (eight) hours as needed for pain.    [provider]  aspirin 81 MG tablet Take 81 mg by mouth daily.    [provider]  Calcium Citrate-Vitamin D (CALCIUM CITRATE + D3 PO) Take 1,200 mg by mouth daily.    [provider]  Cholecalciferol (VITAMIN D3) 125 MCG (5000 UT) TABS Take 5,000 Units  by mouth daily.    [provider]  estradiol (ESTRACE VAGINAL) 0.1 MG/GM vaginal cream Place 1 Applicatorful vaginally 3 (three) times a week. 11/10/22   Johnson, Megan P, DO  letrozole (FEMARA) 2.5 MG tablet Take 1 tablet (2.5 mg total) by mouth daily. 04/12/22   Creig Hines, MD  losartan-hydrochlorothiazide (HYZAAR) 50-12.5 MG tablet Take 1 tablet by mouth daily. 11/01/22   Johnson, Megan P, DO  lovastatin (MEVACOR) 20 MG tablet TAKE 1 TABLET BY MOUTH EVERYDAY AT BEDTIME 11/01/22   Johnson, Megan P, DO  Menthol, Topical Analgesic, (ICY HOT EX) Apply 1 application topically daily as needed (pain).    [provider]  metFORMIN  (GLUCOPHAGE-XR) 500 MG 24 hr tablet Take 2 tablets (1,000 mg total) by mouth 2 (two) times daily with a meal. 11/01/22   Johnson, Megan P, DO  naloxone (NARCAN) nasal spray 4 mg/0.1 mL CALL 911. SPR CONTENTS OF ONE SPRAYER (0.1ML) INTO ONE NOSTRIL. REPEAT IN 2-3 MIN IF SYMPTOMS OF OPIOID EMERGENCY PERSIST, ALTERNATE NOSTRILS 10/19/22   [provider]  Omega-3 Fatty Acids (FISH OIL ULTRA) 1400 MG CAPS Take 1,400 mg by mouth daily.    [provider]  Potassium Citrate 15 MEQ (1620 MG) TBCR Take 2 tablets by mouth 2 (two) times daily. 10/19/22   Sondra Come, MD  spironolactone (ALDACTONE) 50 MG tablet Take 1 tablet (50 mg total) by mouth daily. 11/01/22   Dorcas Carrow, DO    Family History Family History  Problem Relation Age of Onset   Breast cancer Mother 66   Heart disease Father    Cancer Paternal Grandfather    Heart disease Paternal Grandfather     Social History Social History   Tobacco Use   Smoking status: Never    Passive exposure: Never   Smokeless tobacco: Never  Vaping Use   Vaping status: Never Used  Substance Use Topics   Alcohol use: Not Currently   Drug use: Never     Allergies   Other, Wound dressing adhesive, Ciprofloxacin, Morphine and codeine, and Latex   Review of Systems Review of Systems  Skin:  Positive for color change and wound.  All other systems reviewed and are negative.    Physical Exam Triage Vital Signs ED Triage Vitals  Encounter Vitals Group     BP 11/13/22 1311 115/74     Systolic BP Percentile --      Diastolic BP Percentile --      Pulse Rate 11/13/22 1311 92     Resp 11/13/22 1311 16     Temp 11/13/22 1311 99.2 F (37.3 C)     Temp Source 11/13/22 1311 Oral     SpO2 11/13/22 1311 92 %     Weight 11/13/22 1309 (!) 354 lb 15.1 oz (161 kg)     Height 11/13/22 1309 5\' 3"  (1.6 m)     Head Circumference --      Peak Flow --      Pain Score 11/13/22 1309 0     Pain Loc --      Pain Education --       Exclude from Growth Chart --    No data found.  Updated Vital Signs BP 115/74 (BP Location: Right Arm)   Pulse 92   Temp 99.2 F (37.3 C) (Oral)   Resp 16   Ht 5\' 3"  (1.6 m)   Wt (!) 354 lb 15.1 oz (161 kg)   SpO2 92%  BMI 62.87 kg/m   Visual Acuity Right Eye Distance:   Left Eye Distance:   Bilateral Distance:    Right Eye Near:   Left Eye Near:    Bilateral Near:     Physical Exam Vitals and nursing note reviewed.  Skin:    Findings: Abrasion present.     Comments: Left lower leg lateral ankle, clear weeping abrasion noted, no purulent drainage no streaking no redness around site, patient has chronic lymphedema of bilateral lower legs.  Neurological:     General: No focal deficit present.  Psychiatric:        Attention and Perception: Attention normal.        Mood and Affect: Mood normal.        Speech: Speech normal.        Behavior: Behavior normal.      UC Treatments / Results  Labs (all labs ordered are listed, but only abnormal results are displayed) Labs Reviewed - No data to display  EKG   Radiology No results found.  Procedures Procedures (including critical care time)  Medications Ordered in UC Medications - No data to display  Initial Impression / Assessment and Plan / UC Course  I have reviewed the triage vital signs and the nursing notes.  Pertinent labs & imaging results that were available during my care of the patient were reviewed by me and considered in my medical decision making (see chart for details).     Ddx: Abrasion, lymphedema, cellulitis Final Clinical Impressions(s) / UC Diagnoses   Final diagnoses:  Lymphedema  Abrasion     Discharge Instructions      Daily wound care, apply mupirocin, Telfa nonstick dressing.  Follow-up with your lymphedema doctor, if you develop fever, redness, purulent drainage, need to be reevaluated.  Return as needed     ED Prescriptions     Medication Sig Dispense Auth. Provider    mupirocin ointment (BACTROBAN) 2 % Apply 1 Application topically 2 (two) times daily for 7 days. Apply to lower left leg 14 g , Para March, NP      PDMP not reviewed this encounter.   Clancy Gourd, NP 11/13/22 1358

## 2022-11-13 NOTE — Discharge Instructions (Addendum)
Daily wound care, apply mupirocin, Telfa nonstick dressing.  Follow-up with your lymphedema doctor, if you develop fever, redness, purulent drainage, need to be reevaluated.  Return as needed

## 2022-11-13 NOTE — ED Triage Notes (Signed)
Patient had lymphatic wrap and massage done yesterday and states that she developed  a blister yesterday on her left lower leg.  Patient report redness in the area.

## 2022-11-15 ENCOUNTER — Ambulatory Visit: Payer: Medicare Other | Admitting: Occupational Therapy

## 2022-11-15 DIAGNOSIS — I890 Lymphedema, not elsewhere classified: Secondary | ICD-10-CM

## 2022-11-15 NOTE — Therapy (Signed)
OUTPATIENT OCCUPATIONAL THERAPY TREATMENT NOTE   BILATERAL LOWER EXTREMITY LYMPHEDEMA  Patient Name: CRISTENE RECKERS MRN: [id number] DOB:[birth year], 72 y.o., female Today's Date: 11/15/2022  REPORTING PERIOD: 09/07/22 - 11/09/22  END OF SESSION:   OT End of Session - 11/15/22 0905     Visit Number 12    Number of Visits 36    Date for OT Re-Evaluation 12/06/22    OT Start Time 0903    OT Stop Time 1003    OT Time Calculation (min) 60 min    Activity Tolerance Patient tolerated treatment well;No increased pain    Behavior During Therapy WFL for tasks assessed/performed               Past Medical History:  Diagnosis Date   Arthritis    Breast cancer (HCC)    Cataract    Diabetes mellitus without complication (HCC)    Hyperlipidemia    Hypertension    Lymphedema    Vitamin D deficiency    Past Surgical History:  Procedure Laterality Date   ABDOMINAL HYSTERECTOMY     x2   BREAST BIOPSY Right 1990s   neg   BREAST BIOPSY Right 2000s   neg   BREAST BIOPSY Left 11/24/2020   u/s bx 10:30/8 cmfn-"heart" marker   BREAST EXCISIONAL BIOPSY Right    neg   BREAST LUMPECTOMY     BREAST LUMPECTOMY WITH RADIOFREQUENCY TAG IDENTIFICATION Left 12/10/2020   Procedure: BREAST LUMPECTOMY WITH RADIOFREQUENCY TAG IDENTIFICATION;  Surgeon: Campbell Lerner, MD;  Location: [facility] ORS;  Service: General;  Laterality: Left;   DILATION AND CURETTAGE OF UTERUS     EYE SURGERY  2004   cataract surgery and laser surgery   Salpingo-oophorectomy     Patient Active Problem List   Diagnosis Date Noted   Abrasion 11/13/2022   Acute cystitis without hematuria 09/28/2022   Urinary tract infection due to ESBL Klebsiella 09/25/2022   AKI (acute kidney injury) (HCC) 09/25/2022   Lymphedema 09/25/2022   Hypoxia 09/25/2022   Cellulitis 08/04/2022   Edema 01/30/2021   Malignant neoplasm of upper-inner quadrant of left breast in female, estrogen receptor positive (HCC) 12/01/2020   Goals of care,  counseling/discussion 12/01/2020   Morbid obesity (HCC) 08/08/2018   BMI 60.0-69.9, adult (HCC) 08/08/2018   Controlled diabetes mellitus type 2 with complications (HCC) 08/08/2018   Hyperlipidemia 08/08/2018   Hypertension associated with diabetes (HCC) 08/08/2018   Arthritis of both knees 08/08/2018   Vitamin D deficiency 08/08/2018   PCP: Olevia Perches, DO  REFERRING PROVIDER: Gwynn Burly, DO  REFERRING DIAG: I89.0  THERAPY DIAG:  Lymphedema, not elsewhere classified [I89.0]  Rationale for Evaluation and Treatment: Rehabilitation  ONSET DATE: 1 yr ago without known precipitating event- by report  SUBJECTIVE:  SUBJECTIVE STATEMENT: Mao Hamon presents for OT treatment to address BLE lymphedema, L>R. Pt is accompanied by her spouse, [name], who assists her with transfers and with compression bandaging at home between visits. Pt denies LE-related leg pain. She arrives seated in a transport wheelchair w ongoing complaints of kidney stone related pain and arthritis knee pain. Pt reports she went to urgent care over the weekend with a burst blood blister at the L  distal leg . They advised her to apply Bacitracin cream and non stick band aide. Pt does not bring these medications to clinic. Pt opts out of wrapping in clinic stating they will apply wraps when she and her husband return home after session.  Tobacco Use: Low Risk  (11/13/2022)   Patient History    Smoking Tobacco Use: Never    Smokeless Tobacco Use: Never    Passive Exposure: Never   PERTINENT HISTORY: HTN, Obesity w BMI 60-69.9, DM, B knee arthritis, Hx L breast cancer, recurrent L leg cellulitis. MRSA test results pending  PAIN: Are you having pain? Denies LE related pain. Endorses OA knee pain and kidney stone -related  pain  PRECAUTIONS: Fall and Other: lymphedema precautions, DM. Hx L Br Ca  PRIOR LEVEL OF FUNCTION: Independent with household mobility with device, Requires assistive device for independence, Needs assistance with homemaking, Needs assistance with gait, and Needs assistance with transfers  PATIENT GOALS: ... Get improvement for my condition...., keep lymphedema from getting worse, and reduce infection risk"  LYMPHEDEMA ASSESSMENTS:  FOTO Functional Outcomes Measure: Initial 33%  Lymphedema Life Impact Scale (LLIS): 22.06%  BLE COMPARATIVE LIMB VOLUMETRICS Initial 09/14/22  LANDMARK RIGHT    R LEG (A-D) 7717.1 ml  R THIGH (E-G) ml  R FULL LIMB (A-G) ml  Limb Volume differential (LVD)  %  Volume change since initial %  Volume change overall V  (Blank rows = not tested)  LANDMARK LEFT   L LEG (A-D) 7752.1 ml  L THIGH (E-G) ml  L FULL LIMB (A-G) ml  Limb Volume differential (LVD)  Leg LVD measures 0.5%, L>R   Volume change since initial %  Volume change overall %   (Blank rows = not tested)    BLE COMPARATIVE LIMB VOLUMETRICS 9th visit 11/05/22  LANDMARK LEFT   L LEG (A-D) 5445.7 ml  L THIGH (E-G) ml  L FULL LIMB (A-G) ml  Limb Volume differential (LVD)    Volume change since initial  L LEG reduced in volume by 29.43% since 09/14/22  Volume change overall %  (Blank rows = not tested)   TODAY'S TREATMENT:                                                                                                                                         Knee length LLE multilayer compression wraps- as established MLD as established to LLE  PATIENT EDUCATION:  Continued Pt/ CG  edu for lymphedema self care home program throughout session. Topics include outcome of comparative limb volumetrics- starting limb volume differentials (LVDs), technology and gradient techniques used for short stretch, multilayer compression wrapping, simple self-MLD, therapeutic lymphatic pumping exercises,  skin/nail care, LE precautions,. compression garment recommendations and specifications, wear and care schedule and compression garment donning / doffing w assistive devices. Discussed progress towards all OT goals since commencing CDT. All questions answered to the Pt's satisfaction. Good return. Person educated: Patient and Spouse Education method: Explanation, Demonstration, and Handouts Education comprehension: verbalized understanding, returned demonstration, verbal cues required, and needs further education  Lymphedema HOME PROGRAM:Complete Decongestive Therapy (CDT) Self-Management Phase Daily simple self-MLD Daily BLE skin care and inspection Daily lymphatic pumping there ex Appropriate daily compression- knee length, will require max caregiver A to  and doff Leg elevation when seated  ASSESSMENT: CLINICAL IMPRESSION: Small, undressed, pea-sized scab observed on L lateral distal leg is skin area of concern over the weekend. Wound is closed and without signs/ symptoms of infection. Continued Pt and spouse education throughout Rx session.  Pt tolerated MLD to LLE using short neck sequence, abdominal lymphatics and functional inguinal LN without obvious reduction in volume at end of session. Pt and spouse with wash, medicate and dress scabbed area at home. Cont as per POC.  OBJECTIVE IMPAIRMENTS: Abnormal gait, decreased activity tolerance, decreased balance, decreased endurance, decreased knowledge of condition, decreased knowledge of use of DME, decreased mobility, difficulty walking, decreased ROM, decreased strength, increased edema, impaired perceived functional ability, postural dysfunction, obesity, pain, and chronic progressive limb swelling with elevated infection risk and risk of non-healing wounds .   ACTIVITY LIMITATIONS: carrying, lifting, bending, sitting, standing, squatting, sleeping, stairs, transfers, bed mobility, bathing, toileting, dressing, hygiene/grooming, and caring for  others  PARTICIPATION LIMITATIONS: meal prep, cleaning, laundry, driving, shopping, community activity, occupation, yard work, and church  PERSONAL FACTORS: 3+ comorbidities: HTN, Stage III Obesity, B knee arthritis pain and inflammation and impaired AROM 2/2 body habitus  are also affecting patient's functional outcome.   REHAB POTENTIAL:  Fair with daily CG assistance throughout Intensive Phase CDT.  Poor without daily CG assistance with all LE self care home program components  GOALS: Goals reviewed with patient? Yes  SHORT TERM GOALS: Target date: 4th OT Rx visit   Pt will demonstrate understanding of lymphedema precautions and prevention strategies with modified independence using a printed reference to identify at least 5 precautions and discussing how s/he may implement them into daily life to reduce risk of progression with modified assistance ( printed reference). Baseline: modified independence ( printed reference) Goal status: 11/05/22 GOAL MET  2.  Pt will be able to apply multilayer, thigh length, compression wraps using gradient techniques with Max caregiver assistance to decrease limb volume, to limit infection risk, and to limit lymphedema progression.  Baseline: Max A Goal status: 11/05/22 GOAL MET  LONG TERM GOALS: Target date: 12/06/22  Given this patient's Intake score of 22.06 % on the Lymphedema Life Impact Scale (LLIS), patient will experience a reduction of at least 5 points in her perceived level of functional impairment resulting from lymphedema to improve functional performance and quality of life (QOL). Baseline: 22.06 % Goal status ONGOING-PROGRESSING  2.  Given this patient's Intake score of 33 % on the functional outcomes FOTO tool, patient will experience an increase in function of 3 points to improve basic and instrumental ADLs performance, including lymphedema self-care.  Baseline: 33 % Goal status: ONGOING-PROGRESSING  3.  During Intensive phase  CDT Pt  will achieve at least 85% compliance with all lymphedema self-care home program components, including  daily skin care, multilayer , gradient compression wraps with daily changes, daily simple self MLD and daily lymphatic pumping therex to achieve optimal clinical outcome and to habituate self care regime for optimal LE self-management over time. Baseline: Dependent Goal status:11/05/22 GOAL MET  4.  Pt will achieve at least a 10% volume reductions bilaterally below the knees to return limb to more typical size and shape, to limit infection risk and LE progression, to decrease pain, to improve function, and to improve body image and QOL. Baseline: Dependent Goal status:11/05/22 GOAL for L LEG w 29% reduction to date.  5.  Pt will be able to don and doff appropriate compression garments and devices using assistive devices and extra time and Max CG assistance within 1 week of issue date for optimal lymphedema self-care. Baseline: Dependent Goal status: ONGOING PLAN:  PT FREQUENCY: 2 x/week  PT DURATION: 12  weeks other: and PRN  PLANNED INTERVENTIONS: Therapeutic exercises, Therapeutic activity, Patient/Family education, Self Care, DME instructions, Manual lymph drainage, Compression bandaging, Manual therapy, and skin care during MLD, fit with appropriate custom compression garments and devices, fit with appropriate compression device  which  follows lymphatic pathways and anatomic distribution  PLAN FOR NEXT SESSION:  LLE multilayer compression bandages LLE MLD w simultaneous skin care  Loel Dubonnet, MS, OTR/L, CLT-LANA 11/15/22 12:44 PM

## 2022-11-17 ENCOUNTER — Ambulatory Visit: Payer: Medicare Other | Admitting: Occupational Therapy

## 2022-11-17 ENCOUNTER — Other Ambulatory Visit
Admission: RE | Admit: 2022-11-17 | Discharge: 2022-11-17 | Disposition: A | Discharge status: Home / Self Care | Payer: Medicare Other | Source: Home / Self Care | Attending: Family Medicine | Admitting: Family Medicine

## 2022-11-17 ENCOUNTER — Encounter: Payer: Self-pay | Admitting: Occupational Therapy

## 2022-11-17 DIAGNOSIS — N289 Disorder of kidney and ureter, unspecified: Secondary | ICD-10-CM | POA: Insufficient documentation

## 2022-11-17 DIAGNOSIS — I890 Lymphedema, not elsewhere classified: Secondary | ICD-10-CM

## 2022-11-17 LAB — BASIC METABOLIC PANEL
Anion gap: 11 (ref 5–15)
BUN: 33 mg/dL — ABNORMAL HIGH (ref 8–23)
CO2: 27 mmol/L (ref 22–32)
Calcium: 9.1 mg/dL (ref 8.9–10.3)
Chloride: 96 mmol/L — ABNORMAL LOW (ref 98–111)
Creatinine, Ser: 1.38 mg/dL — ABNORMAL HIGH (ref 0.44–1.00)
GFR, Estimated: 40 mL/min — ABNORMAL LOW (ref >60)
Glucose, Bld: 284 mg/dL — ABNORMAL HIGH (ref 70–99)
Potassium: 4.5 mmol/L (ref 3.5–5.1)
Sodium: 134 mmol/L — ABNORMAL LOW (ref 135–145)

## 2022-11-17 NOTE — Therapy (Signed)
OUTPATIENT OCCUPATIONAL THERAPY TREATMENT NOTE   BILATERAL LOWER EXTREMITY LYMPHEDEMA  Patient Name: Sharon Ware MRN: [id number] DOB:1950-09-12, 72 y.o., female Today's Date: 11/17/2022   END OF SESSION:   OT End of Session - 11/17/22 0915     Visit Number 13    Number of Visits 36    Date for OT Re-Evaluation 12/06/22    OT Start Time 0911    OT Stop Time 1005    OT Time Calculation (min) 54 min    Activity Tolerance Patient tolerated treatment well;No increased pain    Behavior During Therapy WFL for tasks assessed/performed               Past Medical History:  Diagnosis Date   Arthritis    Breast cancer (HCC)    Cataract    Diabetes mellitus without complication (HCC)    Hyperlipidemia    Hypertension    Lymphedema    Vitamin D deficiency    Past Surgical History:  Procedure Laterality Date   ABDOMINAL HYSTERECTOMY     x2   BREAST BIOPSY Right 1990s   neg   BREAST BIOPSY Right 2000s   neg   BREAST BIOPSY Left 11/24/2020   u/s bx 10:30/8 cmfn-"heart" marker   BREAST EXCISIONAL BIOPSY Right    neg   BREAST LUMPECTOMY     BREAST LUMPECTOMY WITH RADIOFREQUENCY TAG IDENTIFICATION Left 12/10/2020   Procedure: BREAST LUMPECTOMY WITH RADIOFREQUENCY TAG IDENTIFICATION;  Surgeon: Campbell Lerner, MD;  Location: [facility] ORS;  Service: General;  Laterality: Left;   DILATION AND CURETTAGE OF UTERUS     EYE SURGERY  2004   cataract surgery and laser surgery   Salpingo-oophorectomy     Patient Active Problem List   Diagnosis Date Noted   Abrasion 11/13/2022   Acute cystitis without hematuria 09/28/2022   Urinary tract infection due to ESBL Klebsiella 09/25/2022   AKI (acute kidney injury) (HCC) 09/25/2022   Lymphedema 09/25/2022   Hypoxia 09/25/2022   Cellulitis 08/04/2022   Edema 01/30/2021   Malignant neoplasm of upper-inner quadrant of left breast in female, estrogen receptor positive (HCC) 12/01/2020   Goals of care, counseling/discussion 12/01/2020    Morbid obesity (HCC) 08/08/2018   BMI 60.0-69.9, adult (HCC) 08/08/2018   Controlled diabetes mellitus type 2 with complications (HCC) 08/08/2018   Hyperlipidemia 08/08/2018   Hypertension associated with diabetes (HCC) 08/08/2018   Arthritis of both knees 08/08/2018   Vitamin D deficiency 08/08/2018   PCP: Olevia Perches, DO  REFERRING PROVIDER: Gwynn Burly, DO  REFERRING DIAG: I89.0  THERAPY DIAG:  Lymphedema, not elsewhere classified [I89.0]  Rationale for Evaluation and Treatment: Rehabilitation  ONSET DATE: 1 yr ago without known precipitating event- by report  SUBJECTIVE:  SUBJECTIVE STATEMENT: Sharon Ware presents for OT treatment to address BLE lymphedema, L>R. Pt is accompanied by her spouse, [name], who assists her with transfers and with compression bandaging. Pt denies LE-related leg pain. She arrives seated in a transport wheelchair w ongoing complaints of UTI related pain and arthritis knee pain. Pt has no new complaints or concerns.  Tobacco Use: Low Risk  (11/17/2022)   Patient History    Smoking Tobacco Use: Never    Smokeless Tobacco Use: Never    Passive Exposure: Never   PERTINENT HISTORY: HTN, Obesity w BMI 60-69.9, DM, B knee arthritis, Hx L breast cancer, recurrent L leg cellulitis. MRSA test results pending  PAIN: Are you having pain? Denies LE related pain. Endorses OA knee pain and kidney stone -related pain  PRECAUTIONS: Fall and Other: lymphedema precautions, DM. Hx L Br Ca  PRIOR LEVEL OF FUNCTION: Independent with household mobility with device, Requires assistive device for independence, Needs assistance with homemaking, Needs assistance with gait, and Needs assistance with transfers  PATIENT GOALS: ... Get improvement for my condition...., keep lymphedema from  getting worse, and reduce infection risk"  LYMPHEDEMA ASSESSMENTS:  FOTO Functional Outcomes Measure: Initial 33%  Lymphedema Life Impact Scale (LLIS): 22.06%  BLE COMPARATIVE LIMB VOLUMETRICS Initial 09/14/22  LANDMARK RIGHT    R LEG (A-D) 7717.1 ml  R THIGH (E-G) ml  R FULL LIMB (A-G) ml  Limb Volume differential (LVD)  %  Volume change since initial %  Volume change overall V  (Blank rows = not tested)  LANDMARK LEFT   L LEG (A-D) 7752.1 ml  L THIGH (E-G) ml  L FULL LIMB (A-G) ml  Limb Volume differential (LVD)  Leg LVD measures 0.5%, L>R   Volume change since initial %  Volume change overall %   (Blank rows = not tested)    BLE COMPARATIVE LIMB VOLUMETRICS 9th visit 11/05/22  LANDMARK LEFT   L LEG (A-D) 5445.7 ml  L THIGH (E-G) ml  L FULL LIMB (A-G) ml  Limb Volume differential (LVD)    Volume change since initial  L LEG reduced in volume by 29.43% since 09/14/22  Volume change overall %  (Blank rows = not tested)   TODAY'S TREATMENT:                                                                                                                                         Knee length LLE multilayer compression wraps- as established MLD as established to LLE  PATIENT EDUCATION:  Continued Pt/ CG edu for lymphedema self care home program throughout session. Topics include outcome of comparative limb volumetrics- starting limb volume differentials (LVDs), technology and gradient techniques used for short stretch, multilayer compression wrapping, simple self-MLD, therapeutic lymphatic pumping exercises, skin/nail care, LE precautions,. compression garment recommendations and specifications, wear and care schedule and compression garment donning / doffing w assistive devices. Discussed  progress towards all OT goals since commencing CDT. All questions answered to the Pt's satisfaction. Good return. Person educated: Patient and Spouse Education method: Explanation, Demonstration,  and Handouts Education comprehension: verbalized understanding, returned demonstration, verbal cues required, and needs further education  Lymphedema HOME PROGRAM:Complete Decongestive Therapy (CDT) Self-Management Phase Daily simple self-MLD Daily BLE skin care and inspection Daily lymphatic pumping there ex Appropriate daily compression- knee length, will require max caregiver A to  and doff Leg elevation when seated  ASSESSMENT: CLINICAL IMPRESSION: Small wound on L distal leg is closed and without signs/ symptoms of infection. Pt has kept this treated with OTC Bacitracin and covered with non-stick pad as directed. Continued Pt and spouse education for LE self-care throughout Rx session.  Pt tolerated MLD to LLE using short neck sequence, abdominal lymphatics and functional inguinal LN without obvious reduction in volume at end of session. Cont as per POC.  OBJECTIVE IMPAIRMENTS: Abnormal gait, decreased activity tolerance, decreased balance, decreased endurance, decreased knowledge of condition, decreased knowledge of use of DME, decreased mobility, difficulty walking, decreased ROM, decreased strength, increased edema, impaired perceived functional ability, postural dysfunction, obesity, pain, and chronic progressive limb swelling with elevated infection risk and risk of non-healing wounds .   ACTIVITY LIMITATIONS: carrying, lifting, bending, sitting, standing, squatting, sleeping, stairs, transfers, bed mobility, bathing, toileting, dressing, hygiene/grooming, and caring for others  PARTICIPATION LIMITATIONS: meal prep, cleaning, laundry, driving, shopping, community activity, occupation, yard work, and church  PERSONAL FACTORS: 3+ comorbidities: HTN, Stage III Obesity, B knee arthritis pain and inflammation and impaired AROM 2/2 body habitus  are also affecting patient's functional outcome.   REHAB POTENTIAL:  Fair with daily CG assistance throughout Intensive Phase CDT.  Poor without  daily CG assistance with all LE self care home program components  GOALS: Goals reviewed with patient? Yes  SHORT TERM GOALS: Target date: 4th OT Rx visit   Pt will demonstrate understanding of lymphedema precautions and prevention strategies with modified independence using a printed reference to identify at least 5 precautions and discussing how s/he may implement them into daily life to reduce risk of progression with modified assistance ( printed reference). Baseline: modified independence ( printed reference) Goal status: 11/05/22 GOAL MET  2.  Pt will be able to apply multilayer, thigh length, compression wraps using gradient techniques with Max caregiver assistance to decrease limb volume, to limit infection risk, and to limit lymphedema progression.  Baseline: Max A Goal status: 11/05/22 GOAL MET  LONG TERM GOALS: Target date: 12/06/22  Given this patient's Intake score of 22.06 % on the Lymphedema Life Impact Scale (LLIS), patient will experience a reduction of at least 5 points in her perceived level of functional impairment resulting from lymphedema to improve functional performance and quality of life (QOL). Baseline: 22.06 % Goal status ONGOING-PROGRESSING  2.  Given this patient's Intake score of 33 % on the functional outcomes FOTO tool, patient will experience an increase in function of 3 points to improve basic and instrumental ADLs performance, including lymphedema self-care.  Baseline: 33 % Goal status: ONGOING-PROGRESSING  3.  During Intensive phase CDT Pt will achieve at least 85% compliance with all lymphedema self-care home program components, including  daily skin care, multilayer , gradient compression wraps with daily changes, daily simple self MLD and daily lymphatic pumping therex to achieve optimal clinical outcome and to habituate self care regime for optimal LE self-management over time. Baseline: Dependent Goal status:11/05/22 GOAL MET  4.  Pt will achieve at  least a 10% volume reductions bilaterally below the knees to return limb to more typical size and shape, to limit infection risk and LE progression, to decrease pain, to improve function, and to improve body image and QOL. Baseline: Dependent Goal status:11/05/22 GOAL for L LEG w 29% reduction to date.  5.  Pt will be able to don and doff appropriate compression garments and devices using assistive devices and extra time and Max CG assistance within 1 week of issue date for optimal lymphedema self-care. Baseline: Dependent Goal status: ONGOING PLAN:  PT FREQUENCY: 2 x/week  PT DURATION: 12  weeks other: and PRN  PLANNED INTERVENTIONS: Therapeutic exercises, Therapeutic activity, Patient/Family education, Self Care, DME instructions, Manual lymph drainage, Compression bandaging, Manual therapy, and skin care during MLD, fit with appropriate custom compression garments and devices, fit with appropriate compression device  which  follows lymphatic pathways and anatomic distribution  PLAN FOR NEXT SESSION:  LLE multilayer compression bandages LLE MLD w simultaneous skin care  Loel Dubonnet, MS, OTR/L, CLT-LANA 11/17/22 11:46 AM

## 2022-11-18 ENCOUNTER — Ambulatory Visit
Admission: RE | Admit: 2022-11-18 | Discharge: 2022-11-18 | Disposition: A | Discharge status: Home / Self Care | Payer: Medicare Other | Source: Ambulatory Visit | Attending: Oncology | Admitting: Oncology

## 2022-11-18 DIAGNOSIS — R1011 Right upper quadrant pain: Secondary | ICD-10-CM | POA: Diagnosis present

## 2022-11-18 DIAGNOSIS — Z08 Encounter for follow-up examination after completed treatment for malignant neoplasm: Secondary | ICD-10-CM | POA: Diagnosis present

## 2022-11-18 DIAGNOSIS — Z853 Personal history of malignant neoplasm of breast: Secondary | ICD-10-CM | POA: Diagnosis present

## 2022-11-19 ENCOUNTER — Ambulatory Visit
Admission: RE | Admit: 2022-11-19 | Discharge: 2022-11-19 | Disposition: A | Discharge status: Home / Self Care | Payer: Medicare Other | Source: Ambulatory Visit | Attending: Urology | Admitting: Urology

## 2022-11-19 ENCOUNTER — Other Ambulatory Visit: Payer: Medicare Other

## 2022-11-19 DIAGNOSIS — R1011 Right upper quadrant pain: Secondary | ICD-10-CM | POA: Diagnosis not present

## 2022-11-19 DIAGNOSIS — N289 Disorder of kidney and ureter, unspecified: Secondary | ICD-10-CM

## 2022-11-20 LAB — COMPREHENSIVE METABOLIC PANEL
ALT: 24 IU/L (ref 0–32)
AST: 21 IU/L (ref 0–40)
Albumin: 3.8 g/dL (ref 3.8–4.8)
Alkaline Phosphatase: 76 IU/L (ref 44–121)
BUN/Creatinine Ratio: 22 (ref 12–28)
BUN: 28 mg/dL — ABNORMAL HIGH (ref 8–27)
Bilirubin Total: 0.5 mg/dL (ref 0.0–1.2)
CO2: 24 mmol/L (ref 20–29)
Calcium: 9.9 mg/dL (ref 8.7–10.3)
Chloride: 96 mmol/L (ref 96–106)
Creatinine, Ser: 1.25 mg/dL — ABNORMAL HIGH (ref 0.57–1.00)
Globulin, Total: 3.1 g/dL (ref 1.5–4.5)
Glucose: 227 mg/dL — ABNORMAL HIGH (ref 70–99)
Potassium: 4.8 mmol/L (ref 3.5–5.2)
Sodium: 136 mmol/L (ref 134–144)
Total Protein: 6.9 g/dL (ref 6.0–8.5)
eGFR: 45 mL/min/{1.73_m2} — ABNORMAL LOW (ref >59)

## 2022-11-22 ENCOUNTER — Other Ambulatory Visit: Payer: Medicare Other

## 2022-11-22 ENCOUNTER — Other Ambulatory Visit: Payer: Self-pay | Admitting: Family Medicine

## 2022-11-22 ENCOUNTER — Encounter: Payer: Medicare Other | Admitting: Occupational Therapy

## 2022-11-22 DIAGNOSIS — N289 Disorder of kidney and ureter, unspecified: Secondary | ICD-10-CM

## 2022-11-23 ENCOUNTER — Ambulatory Visit (INDEPENDENT_AMBULATORY_CARE_PROVIDER_SITE_OTHER): Payer: Medicare Other | Admitting: Urology

## 2022-11-23 VITALS — BP 113/70 | HR 93

## 2022-11-23 DIAGNOSIS — N200 Calculus of kidney: Secondary | ICD-10-CM

## 2022-11-23 DIAGNOSIS — R399 Unspecified symptoms and signs involving the genitourinary system: Secondary | ICD-10-CM | POA: Diagnosis not present

## 2022-11-23 DIAGNOSIS — N390 Urinary tract infection, site not specified: Secondary | ICD-10-CM

## 2022-11-23 DIAGNOSIS — N958 Other specified menopausal and perimenopausal disorders: Secondary | ICD-10-CM

## 2022-11-23 DIAGNOSIS — N3281 Overactive bladder: Secondary | ICD-10-CM

## 2022-11-23 DIAGNOSIS — Z87440 Personal history of urinary (tract) infections: Secondary | ICD-10-CM

## 2022-11-23 MED ORDER — POTASSIUM CITRATE ER 15 MEQ (1620 MG) PO TBCR: 2.0000 | EXTENDED_RELEASE_TABLET | Freq: Three times a day (TID) | ORAL | 3 refills | Status: DC

## 2022-11-23 MED ORDER — GEMTESA 75 MG PO TABS
75.0000 mg | ORAL_TABLET | Freq: Every day | ORAL | Status: DC
Start: 2022-11-23 — End: 2022-12-10

## 2022-11-23 NOTE — Progress Notes (Signed)
Pt stated that she would just walk in.

## 2022-11-23 NOTE — Progress Notes (Signed)
   11/23/2022 2:44 PM   Sharon Ware February 18, 1951 [id number]  Reason for visit: Follow up urinary symptoms, right renal stone, UTI/colonization  HPI: Very comorbid 72 year old female with morbid obesity and BMI of 63, diabetes, significant lymphedema who was recently hospitalized in June 2024 for UTI, CT showed a nonobstructing right renal pelvis stone measuring 14 mm.  She reports she has continued to have intermittent right-sided flank pain.  Urine culture at that time grew ESBL Klebsiella UTI.   She has also had a number of urinary symptoms that has been difficult to differentiate if this is from recurrent UTI, genitourinary syndrome of menopause, or overactive bladder.  I reviewed the recent infectious disease note at length, and they feel this is more related to genitourinary syndrome of menopause and overactive bladder.  She has had improvement " sometimes" with antibiotics previously.  At our last visit in July 2024 she opted for a trial of dissolution therapy with potassium citrate, as her stone appeared to be uric acid with HU<500 and urine pH 5.5.  I personally viewed and interpreted the most recent CT dated 11/19/2022.  Right renal stone is still present, though decreased in size to 10 mm from 14 mm.  We again reviewed options including continuing potassium citrate dissolution therapy, increasing the dose, or considering right ureteroscopy, laser lithotripsy, and stent placement. We specifically discussed the risks ureteroscopy including bleeding, infection/sepsis, stent related symptoms including flank pain/urgency/frequency/incontinence/dysuria, ureteral injury, inability to access stone, or need for staged or additional procedures.  She remains resistant to considering ureteroscopy/laser lithotripsy at this time and would like to continue alkaline therapy.  Potassium citrate dose increased to 30 mEq 3 times daily, and we will check a potassium in 1 week.  Repeat CT in 6 weeks, if  stone still present would again strongly recommend right ureteroscopy and laser lithotripsy.  In terms of her urinary symptoms, primary issue is occasional dysuria and urgency/frequency.  Samples of Gemtesa were provided.  She is resistant to trying the topical estrogen cream with her history of breast cancer, though this has been cleared by her oncologist.  We discussed other non-hormonal topical options that can be purchased over-the-counter and can sometimes help improve some of the local symptoms from [facility]SM.    I spent 40 total minutes on the day of the encounter including pre-visit review of the medical record, face-to-face time with the patient, and post visit ordering of labs/imaging/tests.   Sondra Come, MD  [facility] [address], [address] [place], [place] [address] [phone]

## 2022-11-24 ENCOUNTER — Encounter: Payer: Medicare Other | Admitting: Occupational Therapy

## 2022-11-25 ENCOUNTER — Ambulatory Visit: Payer: Medicare Other | Admitting: Occupational Therapy

## 2022-11-25 DIAGNOSIS — I890 Lymphedema, not elsewhere classified: Secondary | ICD-10-CM | POA: Diagnosis not present

## 2022-11-25 NOTE — Therapy (Signed)
OUTPATIENT OCCUPATIONAL THERAPY TREATMENT NOTE   BILATERAL LOWER EXTREMITY LYMPHEDEMA  Patient Name: Sharon Ware MRN: [id number] DOB:[birth year], 72 y.o., female Today's Date: 11/25/2022   END OF SESSION:   OT End of Session - 11/25/22 0913     Visit Number 14    Number of Visits 36    Date for OT Re-Evaluation 12/06/22    OT Start Time 0900    OT Stop Time 1005    OT Time Calculation (min) 65 min    Activity Tolerance Patient tolerated treatment well;No increased pain    Behavior During Therapy WFL for tasks assessed/performed               Past Medical History:  Diagnosis Date   Arthritis    Breast cancer (HCC)    Cataract    Diabetes mellitus without complication (HCC)    Hyperlipidemia    Hypertension    Lymphedema    Vitamin D deficiency    Past Surgical History:  Procedure Laterality Date   ABDOMINAL HYSTERECTOMY     x2   BREAST BIOPSY Right 1990s   neg   BREAST BIOPSY Right 2000s   neg   BREAST BIOPSY Left 11/24/2020   u/s bx 10:30/8 cmfn-"heart" marker   BREAST EXCISIONAL BIOPSY Right    neg   BREAST LUMPECTOMY     BREAST LUMPECTOMY WITH RADIOFREQUENCY TAG IDENTIFICATION Left 12/10/2020   Procedure: BREAST LUMPECTOMY WITH RADIOFREQUENCY TAG IDENTIFICATION;  Surgeon: Campbell Lerner, MD;  Location: [facility] ORS;  Service: General;  Laterality: Left;   DILATION AND CURETTAGE OF UTERUS     EYE SURGERY  2004   cataract surgery and laser surgery   Salpingo-oophorectomy     Patient Active Problem List   Diagnosis Date Noted   Abrasion 11/13/2022   Acute cystitis without hematuria 09/28/2022   Urinary tract infection due to ESBL Klebsiella 09/25/2022   AKI (acute kidney injury) (HCC) 09/25/2022   Lymphedema 09/25/2022   Hypoxia 09/25/2022   Cellulitis 08/04/2022   Edema 01/30/2021   Malignant neoplasm of upper-inner quadrant of left breast in female, estrogen receptor positive (HCC) 12/01/2020   Goals of care, counseling/discussion 12/01/2020    Morbid obesity (HCC) 08/08/2018   BMI 60.0-69.9, adult (HCC) 08/08/2018   Controlled diabetes mellitus type 2 with complications (HCC) 08/08/2018   Hyperlipidemia 08/08/2018   Hypertension associated with diabetes (HCC) 08/08/2018   Arthritis of both knees 08/08/2018   Vitamin D deficiency 08/08/2018   PCP: Olevia Perches, DO  REFERRING PROVIDER: Gwynn Burly, DO  REFERRING DIAG: I89.0  THERAPY DIAG:  Lymphedema, not elsewhere classified [I89.0]  Rationale for Evaluation and Treatment: Rehabilitation  ONSET DATE: 1 yr ago without known precipitating event- by report  SUBJECTIVE:  SUBJECTIVE STATEMENT: Sharon Ware presents for OT treatment to address BLE lymphedema, L>R. Pt is accompanied by her spouse, [name], who assists her with transfers and with compression bandaging. Pt denies LE-related leg pain. She arrives seated in a transport wheelchair w ongoing complaints of UTI related pain and arthritis knee pain. Pt has no new complaints or concerns.  Tobacco Use: Low Risk  (11/17/2022)   Patient History    Smoking Tobacco Use: Never    Smokeless Tobacco Use: Never    Passive Exposure: Never   PERTINENT HISTORY: HTN, Obesity w BMI 60-69.9, DM, B knee arthritis, Hx L breast cancer, recurrent L leg cellulitis. MRSA test results pending  PAIN: Are you having pain? Denies LE related pain. Endorses OA knee pain and kidney stone -related pain  PRECAUTIONS: Fall and Other: lymphedema precautions, DM. Hx L Br Ca  PRIOR LEVEL OF FUNCTION: Independent with household mobility with device, Requires assistive device for independence, Needs assistance with homemaking, Needs assistance with gait, and Needs assistance with transfers  PATIENT GOALS: ... Get improvement for my condition...., keep lymphedema from  getting worse, and reduce infection risk"  LYMPHEDEMA ASSESSMENTS:  FOTO Functional Outcomes Measure: Initial 33%  Lymphedema Life Impact Scale (LLIS): 22.06%  BLE COMPARATIVE LIMB VOLUMETRICS Initial 09/14/22  LANDMARK RIGHT    R LEG (A-D) 7717.1 ml  R THIGH (E-G) ml  R FULL LIMB (A-G) ml  Limb Volume differential (LVD)  %  Volume change since initial %  Volume change overall V  (Blank rows = not tested)  LANDMARK LEFT   L LEG (A-D) 7752.1 ml  L THIGH (E-G) ml  L FULL LIMB (A-G) ml  Limb Volume differential (LVD)  Leg LVD measures 0.5%, L>R   Volume change since initial %  Volume change overall %   (Blank rows = not tested)    BLE COMPARATIVE LIMB VOLUMETRICS 9th visit 11/05/22  LANDMARK LEFT   L LEG (A-D) 5445.7 ml  L THIGH (E-G) ml  L FULL LIMB (A-G) ml  Limb Volume differential (LVD)    Volume change since initial  L LEG reduced in volume by 29.43% since 09/14/22  Volume change overall %  (Blank rows = not tested)   TODAY'S TREATMENT:                                                                                                                                         Knee length LLE multilayer compression wraps- as established MLD as established to LLE  PATIENT EDUCATION:  Continued Pt/ CG edu for lymphedema self care home program throughout session. Topics include outcome of comparative limb volumetrics- starting limb volume differentials (LVDs), technology and gradient techniques used for short stretch, multilayer compression wrapping, simple self-MLD, therapeutic lymphatic pumping exercises, skin/nail care, LE precautions,. compression garment recommendations and specifications, wear and care schedule and compression garment donning / doffing w assistive devices. Discussed  progress towards all OT goals since commencing CDT. All questions answered to the Pt's satisfaction. Good return. Person educated: Patient and Spouse Education method: Explanation, Demonstration,  and Handouts Education comprehension: verbalized understanding, returned demonstration, verbal cues required, and needs further education  Lymphedema HOME PROGRAM:Complete Decongestive Therapy (CDT) Self-Management Phase Daily simple self-MLD Daily BLE skin care and inspection Daily lymphatic pumping there ex Appropriate daily compression- knee length, will require max caregiver A to  and doff Leg elevation when seated  ASSESSMENT: CLINICAL IMPRESSION:   Pt tolerated MLD to LLE using short neck sequence, abdominal lymphatics and functional inguinal LN without obvious reduction in volume at end of session. Provided skin care to L leg simultaneously w MLD to limit infection risk. Re applied compression wraps as established. Vendor let us know garment was ordered on 8/15. Cont as per POC.  OBJECTIVE IMPAIRMENTS: Abnormal gait, decreased activity tolerance, decreased balance, decreased endurance, decreased knowledge of condition, decreased knowledge of use of DME, decreased mobility, difficulty walking, decreased ROM, decreased strength, increased edema, impaired perceived functional ability, postural dysfunction, obesity, pain, and chronic progressive limb swelling with elevated infection risk and risk of non-healing wounds .   ACTIVITY LIMITATIONS: carrying, lifting, bending, sitting, standing, squatting, sleeping, stairs, transfers, bed mobility, bathing, toileting, dressing, hygiene/grooming, and caring for others  PARTICIPATION LIMITATIONS: meal prep, cleaning, laundry, driving, shopping, community activity, occupation, yard work, and church  PERSONAL FACTORS: 3+ comorbidities: HTN, Stage III Obesity, B knee arthritis pain and inflammation and impaired AROM 2/2 body habitus  are also affecting patient's functional outcome.   REHAB POTENTIAL:  Fair with daily CG assistance throughout Intensive Phase CDT.  Poor without daily CG assistance with all LE self care home program  components  GOALS: Goals reviewed with patient? Yes  SHORT TERM GOALS: Target date: 4th OT Rx visit   Pt will demonstrate understanding of lymphedema precautions and prevention strategies with modified independence using a printed reference to identify at least 5 precautions and discussing how s/he may implement them into daily life to reduce risk of progression with modified assistance ( printed reference). Baseline: modified independence ( printed reference) Goal status: 11/05/22 GOAL MET  2.  Pt will be able to apply multilayer, thigh length, compression wraps using gradient techniques with Max caregiver assistance to decrease limb volume, to limit infection risk, and to limit lymphedema progression.  Baseline: Max A Goal status: 11/05/22 GOAL MET  LONG TERM GOALS: Target date: 12/06/22  Given this patient's Intake score of 22.06 % on the Lymphedema Life Impact Scale (LLIS), patient will experience a reduction of at least 5 points in her perceived level of functional impairment resulting from lymphedema to improve functional performance and quality of life (QOL). Baseline: 22.06 % Goal status ONGOING-PROGRESSING  2.  Given this patient's Intake score of 33 % on the functional outcomes FOTO tool, patient will experience an increase in function of 3 points to improve basic and instrumental ADLs performance, including lymphedema self-care.  Baseline: 33 % Goal status: ONGOING-PROGRESSING  3.  During Intensive phase CDT Pt will achieve at least 85% compliance with all lymphedema self-care home program components, including  daily skin care, multilayer , gradient compression wraps with daily changes, daily simple self MLD and daily lymphatic pumping therex to achieve optimal clinical outcome and to habituate self care regime for optimal LE self-management over time. Baseline: Dependent Goal status:11/05/22 GOAL MET  4.  Pt will achieve at least a 10% volume reductions bilaterally below the knees  to return  limb to more typical size and shape, to limit infection risk and LE progression, to decrease pain, to improve function, and to improve body image and QOL. Baseline: Dependent Goal status:11/05/22 GOAL for L LEG w 29% reduction to date.  5.  Pt will be able to don and doff appropriate compression garments and devices using assistive devices and extra time and Max CG assistance within 1 week of issue date for optimal lymphedema self-care. Baseline: Dependent Goal status: ONGOING PLAN:  PT FREQUENCY: 2 x/week  PT DURATION: 12  weeks other: and PRN  PLANNED INTERVENTIONS: Therapeutic exercises, Therapeutic activity, Patient/Family education, Self Care, DME instructions, Manual lymph drainage, Compression bandaging, Manual therapy, and skin care during MLD, fit with appropriate custom compression garments and devices, fit with appropriate compression device  which  follows lymphatic pathways and anatomic distribution  PLAN FOR NEXT SESSION:  LLE multilayer compression bandages LLE MLD w simultaneous skin care  Loel Dubonnet, MS, OTR/L, CLT-LANA 11/25/22 12:06 PM

## 2022-11-26 ENCOUNTER — Ambulatory Visit: Payer: Medicare Other | Admitting: Occupational Therapy

## 2022-11-29 ENCOUNTER — Other Ambulatory Visit: Payer: Self-pay

## 2022-11-29 ENCOUNTER — Encounter: Payer: Medicare Other | Admitting: Occupational Therapy

## 2022-11-29 ENCOUNTER — Ambulatory Visit: Payer: Medicare Other | Admitting: Occupational Therapy

## 2022-11-29 ENCOUNTER — Other Ambulatory Visit: Payer: Self-pay | Admitting: Urology

## 2022-11-29 ENCOUNTER — Telehealth: Payer: Self-pay

## 2022-11-29 DIAGNOSIS — I890 Lymphedema, not elsewhere classified: Secondary | ICD-10-CM | POA: Diagnosis not present

## 2022-11-29 DIAGNOSIS — N200 Calculus of kidney: Secondary | ICD-10-CM

## 2022-11-29 MED ORDER — AMOXICILLIN-POT CLAVULANATE 875-125 MG PO TABS: 1.0000 | ORAL_TABLET | Freq: Two times a day (BID) | ORAL | 0 refills | Status: DC

## 2022-11-29 NOTE — Progress Notes (Signed)
   [facility] Posting Form  Surgery Date: Date: 12/10/2022  Surgeon: Dr. Legrand Rams, MD  Inpt ( No  )   Outpt (Yes)   Obs ( No  )   Diagnosis: N20.0 Right Nephrolithiasis  -CPT: [phone]  Surgery: Right Ureteroscopy with Laser Lithotripsy and Stent Placement  Stop Anticoagulations: No, may continue all  Cardiac/Medical/Pulmonary Clearance needed: no  *Orders entered into EPIC  Date: 11/29/22   *Case booked in [place]  Date: 11/29/22  *Notified pt of Surgery: Date: 11/29/22  PRE-OP UA & CX: no, will treat with Augmentin prior to surgery  *Placed into Prior Authorization Work [place] Date: 11/29/22  Assistant/laser/rep:No

## 2022-11-29 NOTE — Progress Notes (Signed)
Surgical Physician Order Form [facility]  Dr. Richardo Hanks, MD  * Scheduling expectation : 12/10/2022  *Length of Case: 1 hour  *Clearance needed: no  *Anticoagulation Instructions: May continue all anticoagulants  *Aspirin Instructions: Ok to continue Aspirin  *Post-op visit Date/Instructions:  tbd  *Diagnosis: Right Nephrolithiasis  *Procedure: right Ureteroscopy w/laser lithotripsy & stent placement (16109)   Additional orders: N/A  -Admit type: OUTpatient  -Anesthesia: General  -VTE Prophylaxis Standing Order SCD's       Other:   -Standing Lab Orders Per Anesthesia    Lab other: UA&Urine Culture  -Standing Test orders EKG/Chest x-ray per Anesthesia       Test other:   - Medications:  Gentamicin per pharmacy  -Other orders:

## 2022-11-29 NOTE — Therapy (Signed)
OUTPATIENT OCCUPATIONAL THERAPY TREATMENT NOTE   BILATERAL LOWER EXTREMITY LYMPHEDEMA  Patient Name: Sharon Ware MRN: [id number] DOB:[birth year], 72 y.o., female Today's Date: 11/29/2022   END OF SESSION:   OT End of Session - 11/29/22 1416     Visit Number 15    Number of Visits 36    Date for OT Re-Evaluation 12/06/22    OT Start Time 0206    OT Stop Time 0306    OT Time Calculation (min) 60 min    Activity Tolerance Patient tolerated treatment well;No increased pain    Behavior During Therapy WFL for tasks assessed/performed               Past Medical History:  Diagnosis Date   Arthritis    Breast cancer (HCC)    Cataract    Diabetes mellitus without complication (HCC)    Hyperlipidemia    Hypertension    Lymphedema    Vitamin D deficiency    Past Surgical History:  Procedure Laterality Date   ABDOMINAL HYSTERECTOMY     x2   BREAST BIOPSY Right 1990s   neg   BREAST BIOPSY Right 2000s   neg   BREAST BIOPSY Left 11/24/2020   u/s bx 10:30/8 cmfn-"heart" marker   BREAST EXCISIONAL BIOPSY Right    neg   BREAST LUMPECTOMY     BREAST LUMPECTOMY WITH RADIOFREQUENCY TAG IDENTIFICATION Left 12/10/2020   Procedure: BREAST LUMPECTOMY WITH RADIOFREQUENCY TAG IDENTIFICATION;  Surgeon: Campbell Lerner, MD;  Location: [facility] ORS;  Service: General;  Laterality: Left;   DILATION AND CURETTAGE OF UTERUS     EYE SURGERY  2004   cataract surgery and laser surgery   Salpingo-oophorectomy     Patient Active Problem List   Diagnosis Date Noted   Abrasion 11/13/2022   Acute cystitis without hematuria 09/28/2022   Urinary tract infection due to ESBL Klebsiella 09/25/2022   AKI (acute kidney injury) (HCC) 09/25/2022   Lymphedema 09/25/2022   Hypoxia 09/25/2022   Cellulitis 08/04/2022   Edema 01/30/2021   Malignant neoplasm of upper-inner quadrant of left breast in female, estrogen receptor positive (HCC) 12/01/2020   Goals of care, counseling/discussion 12/01/2020    Morbid obesity (HCC) 08/08/2018   BMI 60.0-69.9, adult (HCC) 08/08/2018   Controlled diabetes mellitus type 2 with complications (HCC) 08/08/2018   Hyperlipidemia 08/08/2018   Hypertension associated with diabetes (HCC) 08/08/2018   Arthritis of both knees 08/08/2018   Vitamin D deficiency 08/08/2018   PCP: Olevia Perches, DO  REFERRING PROVIDER: Gwynn Burly, DO  REFERRING DIAG: I89.0  THERAPY DIAG:  Lymphedema, not elsewhere classified [I89.0]  Rationale for Evaluation and Treatment: Rehabilitation  ONSET DATE: 1 yr ago without known precipitating event- by report  SUBJECTIVE:  SUBJECTIVE STATEMENT: Sharon Ware presents for OT treatment to address BLE lymphedema, L>R. Pt is accompanied by her spouse, Sharon Ware, who assists her with transfers and with compression bandaging. Pt denies LE-related leg pain. She arrives seated in a transport wheelchair w ongoing complaints of UTI related pain and arthritis knee pain. Pt  reports she decided to have surgery        to remove kidney stone on sept 6th.  Tobacco Use: Low Risk  (11/17/2022)   Patient History    Smoking Tobacco Use: Never    Smokeless Tobacco Use: Never    Passive Exposure: Never   PERTINENT HISTORY: HTN, Obesity w BMI 60-69.9, DM, B knee arthritis, Hx L breast cancer, recurrent L leg cellulitis. MRSA test results pending  PAIN: Are you having pain? Denies LE related pain. Endorses OA knee pain and kidney stone -related pain  PRECAUTIONS: Fall and Other: lymphedema precautions, DM. Hx L Br Ca  PRIOR LEVEL OF FUNCTION: Independent with household mobility with device, Requires assistive device for independence, Needs assistance with homemaking, Needs assistance with gait, and Needs assistance with transfers  PATIENT GOALS: ... Get  improvement for my condition...., keep lymphedema from getting worse, and reduce infection risk"  LYMPHEDEMA ASSESSMENTS:  FOTO Functional Outcomes Measure: Initial 33%  Lymphedema Life Impact Scale (LLIS): 22.06%  BLE COMPARATIVE LIMB VOLUMETRICS Initial 09/14/22  LANDMARK RIGHT    R LEG (A-D) 7717.1 ml  R THIGH (E-G) ml  R FULL LIMB (A-G) ml  Limb Volume differential (LVD)  %  Volume change since initial %  Volume change overall V  (Blank rows = not tested)  LANDMARK LEFT   L LEG (A-D) 7752.1 ml  L THIGH (E-G) ml  L FULL LIMB (A-G) ml  Limb Volume differential (LVD)  Leg LVD measures 0.5%, L>R   Volume change since initial %  Volume change overall %   (Blank rows = not tested)    BLE COMPARATIVE LIMB VOLUMETRICS 9th visit 11/05/22  LANDMARK LEFT   L LEG (A-D) 5445.7 ml  L THIGH (E-G) ml  L FULL LIMB (A-G) ml  Limb Volume differential (LVD)    Volume change since initial  L LEG reduced in volume by 29.43% since 09/14/22  Volume change overall %  (Blank rows = not tested)   TODAY'S TREATMENT:                                                                                                                                         Knee length LLE multilayer compression wraps- as established MLD as established to LLE  PATIENT EDUCATION:  Continued Pt/ CG edu for lymphedema self care home program throughout session. Topics include outcome of comparative limb volumetrics- starting limb volume differentials (LVDs), technology and gradient techniques used for short stretch, multilayer compression wrapping, simple self-MLD, therapeutic lymphatic pumping exercises, skin/nail care, LE precautions,. compression garment recommendations and  specifications, wear and care schedule and compression garment donning / doffing w assistive devices. Discussed progress towards all OT goals since commencing CDT. All questions answered to the Pt's satisfaction. Good return. Person educated: Patient and  Spouse Education method: Explanation, Demonstration, and Handouts Education comprehension: verbalized understanding, returned demonstration, verbal cues required, and needs further education  Lymphedema HOME PROGRAM:Complete Decongestive Therapy (CDT) Self-Management Phase Daily simple self-MLD Daily BLE skin care and inspection Daily lymphatic pumping there ex Appropriate daily compression- knee length, will require max caregiver A to  and doff Leg elevation when seated  ASSESSMENT: CLINICAL IMPRESSION:   Continued Pt tolerated MLD to LLE using short neck sequence, abdominal lymphatics and functional inguinal LN without increased pain. Provided skin care to L leg simultaneously w MLD to limit infection risk. Re applied compression wraps as established. Vendor let us know garment was ordered on 8/15. Cont as per POC.  OBJECTIVE IMPAIRMENTS: Abnormal gait, decreased activity tolerance, decreased balance, decreased endurance, decreased knowledge of condition, decreased knowledge of use of DME, decreased mobility, difficulty walking, decreased ROM, decreased strength, increased edema, impaired perceived functional ability, postural dysfunction, obesity, pain, and chronic progressive limb swelling with elevated infection risk and risk of non-healing wounds .   ACTIVITY LIMITATIONS: carrying, lifting, bending, sitting, standing, squatting, sleeping, stairs, transfers, bed mobility, bathing, toileting, dressing, hygiene/grooming, and caring for others  PARTICIPATION LIMITATIONS: meal prep, cleaning, laundry, driving, shopping, community activity, occupation, yard work, and church  PERSONAL FACTORS: 3+ comorbidities: HTN, Stage III Obesity, B knee arthritis pain and inflammation and impaired AROM 2/2 body habitus  are also affecting patient's functional outcome.   REHAB POTENTIAL:  Fair with daily CG assistance throughout Intensive Phase CDT.  Poor without daily CG assistance with all LE self care  home program components  GOALS: Goals reviewed with patient? Yes  SHORT TERM GOALS: Target date: 4th OT Rx visit   Pt will demonstrate understanding of lymphedema precautions and prevention strategies with modified independence using a printed reference to identify at least 5 precautions and discussing how s/he may implement them into daily life to reduce risk of progression with modified assistance ( printed reference). Baseline: modified independence ( printed reference) Goal status: 11/05/22 GOAL MET  2.  Pt will be able to apply multilayer, thigh length, compression wraps using gradient techniques with Max caregiver assistance to decrease limb volume, to limit infection risk, and to limit lymphedema progression.  Baseline: Max A Goal status: 11/05/22 GOAL MET  LONG TERM GOALS: Target date: 12/06/22  Given this patient's Intake score of 22.06 % on the Lymphedema Life Impact Scale (LLIS), patient will experience a reduction of at least 5 points in her perceived level of functional impairment resulting from lymphedema to improve functional performance and quality of life (QOL). Baseline: 22.06 % Goal status ONGOING-PROGRESSING  2.  Given this patient's Intake score of 33 % on the functional outcomes FOTO tool, patient will experience an increase in function of 3 points to improve basic and instrumental ADLs performance, including lymphedema self-care.  Baseline: 33 % Goal status: ONGOING-PROGRESSING  3.  During Intensive phase CDT Pt will achieve at least 85% compliance with all lymphedema self-care home program components, including  daily skin care, multilayer , gradient compression wraps with daily changes, daily simple self MLD and daily lymphatic pumping therex to achieve optimal clinical outcome and to habituate self care regime for optimal LE self-management over time. Baseline: Dependent Goal status:11/05/22 GOAL MET  4.  Pt will achieve at least a  10% volume reductions bilaterally  below the knees to return limb to more typical size and shape, to limit infection risk and LE progression, to decrease pain, to improve function, and to improve body image and QOL. Baseline: Dependent Goal status:11/05/22 GOAL for L LEG w 29% reduction to date.  5.  Pt will be able to don and doff appropriate compression garments and devices using assistive devices and extra time and Max CG assistance within 1 week of issue date for optimal lymphedema self-care. Baseline: Dependent Goal status: ONGOING PLAN:  PT FREQUENCY: 2 x/week  PT DURATION: 12  weeks other: and PRN  PLANNED INTERVENTIONS: Therapeutic exercises, Therapeutic activity, Patient/Family education, Self Care, DME instructions, Manual lymph drainage, Compression bandaging, Manual therapy, and skin care during MLD, fit with appropriate custom compression garments and devices, fit with appropriate compression device  which  follows lymphatic pathways and anatomic distribution  PLAN FOR NEXT SESSION:  LLE multilayer compression bandages LLE MLD w simultaneous skin care  Loel Dubonnet, MS, OTR/L, CLT-LANA 11/29/22 3:05 PM

## 2022-11-29 NOTE — Telephone Encounter (Signed)
Per Dr. Richardo Hanks,  Patient is to be scheduled for Right Ureteroscopy with Laser Lithotripsy and Stent Placement  Sharon Ware was contacted and possible surgical dates were discussed, Friday September 6th, 2024 was agreed upon for surgery.   Patient was directed to call [phone] between 1-3pm the day before surgery to find out surgical arrival time.  Instructions were given not to eat or drink from midnight on the night before surgery and have a driver for the day of surgery. On the surgery day patient was instructed to enter through the Medical Mall entrance of [facility] report the Same Day Surgery desk.   Pre-Admit Testing will be in contact via phone to set up an interview with the anesthesia team to review your history and medications prior to surgery.   Reminder of this information was sent via MyChart to the patient.

## 2022-12-01 ENCOUNTER — Encounter: Payer: Medicare Other | Admitting: Occupational Therapy

## 2022-12-01 ENCOUNTER — Other Ambulatory Visit
Admission: RE | Admit: 2022-12-01 | Discharge: 2022-12-01 | Disposition: A | Discharge status: Home / Self Care | Payer: Medicare Other | Attending: Urology | Admitting: Urology

## 2022-12-01 DIAGNOSIS — N200 Calculus of kidney: Secondary | ICD-10-CM | POA: Insufficient documentation

## 2022-12-01 LAB — POTASSIUM: Potassium: 4.4 mmol/L (ref 3.5–5.1)

## 2022-12-02 ENCOUNTER — Encounter: Payer: Medicare Other | Admitting: Occupational Therapy

## 2022-12-02 ENCOUNTER — Other Ambulatory Visit: Payer: Self-pay

## 2022-12-02 ENCOUNTER — Encounter: Payer: Self-pay | Admitting: Urgent Care

## 2022-12-02 ENCOUNTER — Encounter
Admission: RE | Admit: 2022-12-02 | Discharge: 2022-12-02 | Disposition: A | Discharge status: Home / Self Care | Payer: Medicare Other | Source: Ambulatory Visit | Attending: Urology | Admitting: Urology

## 2022-12-02 DIAGNOSIS — E118 Type 2 diabetes mellitus with unspecified complications: Secondary | ICD-10-CM

## 2022-12-02 DIAGNOSIS — E1159 Type 2 diabetes mellitus with other circulatory complications: Secondary | ICD-10-CM

## 2022-12-02 HISTORY — DX: Malignant neoplasm of upper-inner quadrant of left female breast: C50.212

## 2022-12-02 HISTORY — DX: Other specified bacterial agents as the cause of diseases classified elsewhere: N39.0

## 2022-12-02 HISTORY — DX: Personal history of urinary calculi: Z87.442

## 2022-12-02 HISTORY — DX: Other specified menopausal and perimenopausal disorders: N95.8

## 2022-12-02 HISTORY — DX: Morbid (severe) obesity due to excess calories: E66.01

## 2022-12-02 HISTORY — DX: Pneumonia, unspecified organism: J18.9

## 2022-12-02 HISTORY — DX: Other specified bacterial agents as the cause of diseases classified elsewhere: B96.89

## 2022-12-02 HISTORY — DX: Estrogen receptor positive status (ER+): Z17.0

## 2022-12-02 NOTE — Patient Instructions (Addendum)
Your procedure is scheduled on: 12/10/22 - Friday Report to the Registration Desk on the 1st floor of the Medical Mall. To find out your arrival time, please call [phone] between 1PM - 3PM on: 12/09/22 - Thursday If your arrival time is 6:00 am, do not arrive before that time as the Medical Mall entrance doors do not open until 6:00 am.  REMEMBER: Instructions that are not followed completely may result in serious medical risk, up to and including death; or upon the discretion of your surgeon and anesthesiologist your surgery may need to be rescheduled.  Do not eat food or drink any liquids after midnight the night before surgery.  No gum chewing or hard candies.   One week prior to surgery: STOP ICY HOT EX and  Stop Anti-inflammatories (NSAIDS) such as Advil, Aleve, Ibuprofen, Motrin, Naproxen, Naprosyn and Aspirin based products such as Excedrin, Goody's Powder, BC Powder. You may however, continue to take Tylenol if needed for pain up until the day of surgery.  Stop taking ANY OVER THE COUNTER supplements beginning 12/03/22 until after surgery.   TAKE ONLY THESE MEDICATIONS THE MORNING OF SURGERY WITH A SIP OF WATER:  letrozole (FEMARA)     HOLD metFORMIN (GLUCOPHAGE-XR) beginning 12/08/22. HOLD Semaglutide (RYBELSUS) beginning 12/09/22.  No Alcohol for 24 hours before or after surgery.  No Smoking including e-cigarettes for 24 hours before surgery.  No chewable tobacco products for at least 6 hours before surgery.  No nicotine patches on the day of surgery.  Do not use any "recreational" drugs for at least a week (preferably 2 weeks) before your surgery.  Please be advised that the combination of cocaine and anesthesia may have negative outcomes, up to and including death. If you test positive for cocaine, your surgery will be cancelled.  On the morning of surgery brush your teeth with toothpaste and water, you may rinse your mouth with mouthwash if you wish. Do not  swallow any toothpaste or mouthwash.   Do not wear jewelry, make-up, hairpins, clips or nail polish.  Do not wear lotions, powders, or perfumes.   Do not shave body hair from the neck down 48 hours before surgery.  Contact lenses, hearing aids and dentures may not be worn into surgery.  Do not bring valuables to the hospital. [facility] is not responsible for any missing/lost belongings or valuables.   Notify your doctor if there is any change in your medical condition (cold, fever, infection).  Wear comfortable clothing (specific to your surgery type) to the hospital.  After surgery, you can help prevent lung complications by doing breathing exercises.  Take deep breaths and cough every 1-2 hours. Your doctor may order a device called an Incentive Spirometer to help you take deep breaths. When coughing or sneezing, hold a pillow firmly against your incision with both hands. This is called "splinting." Doing this helps protect your incision. It also decreases belly discomfort.  If you are being admitted to the hospital overnight, leave your suitcase in the car. After surgery it may be brought to your room.  In case of increased patient census, it may be necessary for you, the patient, to continue your postoperative care in the Same Day Surgery department.  If you are being discharged the day of surgery, you will not be allowed to drive home. You will need a responsible individual to drive you home and stay with you for 24 hours after surgery.   If you are taking public transportation, you will  need to have a responsible individual with you.  Please call the Pre-admissions Testing Dept. at [phone] if you have any questions about these instructions.  Surgery Visitation Policy:  Patients having surgery or a procedure may have two visitors.  Children under the age of 32 must have an adult with them who is not the patient.  Inpatient Visitation:    Visiting hours are 7 a.m.  to 8 p.m. Up to four visitors are allowed at one time in a patient room. The visitors may rotate out with other people during the day.  One visitor age 41 or older may stay with the patient overnight and must be in the room by 8 p.m.

## 2022-12-03 ENCOUNTER — Other Ambulatory Visit: Payer: Self-pay | Admitting: Oncology

## 2022-12-03 ENCOUNTER — Other Ambulatory Visit: Payer: Medicare Other

## 2022-12-03 ENCOUNTER — Encounter
Admission: RE | Admit: 2022-12-03 | Discharge: 2022-12-03 | Disposition: A | Discharge status: Home / Self Care | Payer: Medicare Other | Source: Ambulatory Visit | Attending: Urology | Admitting: Urology

## 2022-12-03 DIAGNOSIS — E1159 Type 2 diabetes mellitus with other circulatory complications: Secondary | ICD-10-CM | POA: Diagnosis not present

## 2022-12-03 DIAGNOSIS — Z01810 Encounter for preprocedural cardiovascular examination: Secondary | ICD-10-CM | POA: Insufficient documentation

## 2022-12-03 DIAGNOSIS — I152 Hypertension secondary to endocrine disorders: Secondary | ICD-10-CM | POA: Insufficient documentation

## 2022-12-03 DIAGNOSIS — E118 Type 2 diabetes mellitus with unspecified complications: Secondary | ICD-10-CM

## 2022-12-06 NOTE — Patient Instructions (Signed)
Diabetes Mellitus Basics  Diabetes mellitus, or diabetes, is a long-term (chronic) disease. It occurs when the body does not properly use sugar (glucose) that is released from food after you eat. Diabetes mellitus may be caused by one or both of these problems: Your pancreas does not make enough of a hormone called insulin. Your body does not react in a normal way to the insulin that it makes. Insulin lets glucose enter cells in your body. This gives you energy. If you have diabetes, glucose cannot get into cells. This causes high blood glucose (hyperglycemia). How to treat and manage diabetes You may need to take insulin or other diabetes medicines daily to keep your glucose in balance. If you are prescribed insulin, you will learn how to give yourself insulin by injection. You may need to adjust the amount of insulin you take based on the foods that you eat. You will need to check your blood glucose levels using a glucose monitor as told by your health care provider. The readings can help determine if you have low or high blood glucose. Generally, you should have these blood glucose levels: Before meals (preprandial): 80-130 mg/dL (4.4-7.2 mmol/L). After meals (postprandial): below 180 mg/dL (10 mmol/L). Hemoglobin A1c (HbA1c) level: less than 7%. Your health care provider will set treatment goals for you. Keep all follow-up visits. This is important. Follow these instructions at home: Diabetes medicines Take your diabetes medicines every day as told by your health care provider. List your diabetes medicines here: Name of medicine: ______________________________ Amount (dose): _______________ Time (a.m./p.m.): _______________ Notes: ___________________________________ Name of medicine: ______________________________ Amount (dose): _______________ Time (a.m./p.m.): _______________ Notes: ___________________________________ Name of medicine: ______________________________ Amount (dose):  _______________ Time (a.m./p.m.): _______________ Notes: ___________________________________ Insulin If you use insulin, list the types of insulin you use here: Insulin type: ______________________________ Amount (dose): _______________ Time (a.m./p.m.): _______________Notes: ___________________________________ Insulin type: ______________________________ Amount (dose): _______________ Time (a.m./p.m.): _______________ Notes: ___________________________________ Insulin type: ______________________________ Amount (dose): _______________ Time (a.m./p.m.): _______________ Notes: ___________________________________ Insulin type: ______________________________ Amount (dose): _______________ Time (a.m./p.m.): _______________ Notes: ___________________________________ Insulin type: ______________________________ Amount (dose): _______________ Time (a.m./p.m.): _______________ Notes: ___________________________________ Managing blood glucose  Check your blood glucose levels using a glucose monitor as told by your health care provider. Write down the times that you check your glucose levels here: Time: _______________ Notes: ___________________________________ Time: _______________ Notes: ___________________________________ Time: _______________ Notes: ___________________________________ Time: _______________ Notes: ___________________________________ Time: _______________ Notes: ___________________________________ Time: _______________ Notes: ___________________________________  Low blood glucose Low blood glucose (hypoglycemia) is when glucose is at or below 70 mg/dL (3.9 mmol/L). Symptoms may include: Feeling: Hungry. Sweaty and clammy. Irritable or easily upset. Dizzy. Sleepy. Having: A fast heartbeat. A headache. A change in your vision. Numbness around the mouth, lips, or tongue. Having trouble with: Moving (coordination). Sleeping. Treating low blood glucose To treat low blood  glucose, eat or drink something containing sugar right away. If you can think clearly and swallow safely, follow the 15:15 rule: Take 15 grams of a fast-acting carb (carbohydrate), as told by your health care provider. Some fast-acting carbs are: Glucose tablets: take 3-4 tablets. Hard candy: eat 3-5 pieces. Fruit juice: drink 4 oz (120 mL). Regular (not diet) soda: drink 4-6 oz (120-180 mL). Honey or sugar: eat 1 Tbsp (15 mL). Check your blood glucose levels 15 minutes after you take the carb. If your glucose is still at or below 70 mg/dL (3.9 mmol/L), take 15 grams of a carb again. If your glucose does not go above 70 mg/dL (3.9 mmol/L) after   3 tries, get help right away. After your glucose goes back to normal, eat a meal or a snack within 1 hour. Treating very low blood glucose If your glucose is at or below 54 mg/dL (3 mmol/L), you have very low blood glucose (severe hypoglycemia). This is an emergency. Do not wait to see if the symptoms will go away. Get medical help right away. Call your local emergency services (911 in the [place] Do not drive yourself to the hospital. Questions to ask your health care provider Should I talk with a diabetes educator? What equipment will I need to care for myself at home? What diabetes medicines do I need? When should I take them? How often do I need to check my blood glucose levels? What number can I call if I have questions? When is my follow-up visit? Where can I find a support group for people with diabetes? Where to find more information American Diabetes Association: www.diabetes.org Association of Diabetes Care and Education Specialists: www.diabeteseducator.org Contact a health care provider if: Your blood glucose is at or above 240 mg/dL (13.3 mmol/L) for 2 days in a row. You have been sick or have had a fever for 2 days or more, and you are not getting better. You have any of these problems for more than 6 hours: You cannot eat or  drink. You feel nauseous. You vomit. You have diarrhea. Get help right away if: Your blood glucose is lower than 54 mg/dL (3 mmol/L). You get confused. You have trouble thinking clearly. You have trouble breathing. These symptoms may represent a serious problem that is an emergency. Do not wait to see if the symptoms will go away. Get medical help right away. Call your local emergency services (911 in the [place] Do not drive yourself to the hospital. Summary Diabetes mellitus is a chronic disease that occurs when the body does not properly use sugar (glucose) that is released from food after you eat. Take insulin and diabetes medicines as told. Check your blood glucose every day, as often as told. Keep all follow-up visits. This is important. This information is not intended to replace advice given to you by your health care provider. Make sure you discuss any questions you have with your health care provider. Document Revised: 07/24/2019 Document Reviewed: 07/24/2019 Elsevier Patient Education  2024 [employer]  

## 2022-12-08 ENCOUNTER — Encounter: Payer: Self-pay | Admitting: Nurse Practitioner

## 2022-12-08 ENCOUNTER — Ambulatory Visit (INDEPENDENT_AMBULATORY_CARE_PROVIDER_SITE_OTHER): Payer: Medicare Other | Admitting: Nurse Practitioner

## 2022-12-08 ENCOUNTER — Encounter: Payer: Self-pay | Admitting: Occupational Therapy

## 2022-12-08 ENCOUNTER — Ambulatory Visit: Payer: Medicare Other | Admitting: Occupational Therapy

## 2022-12-08 ENCOUNTER — Ambulatory Visit: Payer: Medicare Other | Attending: Infectious Diseases | Admitting: Occupational Therapy

## 2022-12-08 VITALS — BP 138/71 | HR 97 | Temp 98.1°F | Ht 63.0 in | Wt 339.0 lb

## 2022-12-08 DIAGNOSIS — Z7984 Long term (current) use of oral hypoglycemic drugs: Secondary | ICD-10-CM | POA: Diagnosis not present

## 2022-12-08 DIAGNOSIS — E118 Type 2 diabetes mellitus with unspecified complications: Secondary | ICD-10-CM

## 2022-12-08 DIAGNOSIS — I890 Lymphedema, not elsewhere classified: Secondary | ICD-10-CM | POA: Diagnosis present

## 2022-12-08 DIAGNOSIS — N200 Calculus of kidney: Secondary | ICD-10-CM | POA: Diagnosis not present

## 2022-12-08 MED ORDER — RYBELSUS 7 MG PO TABS: 7.0000 mg | ORAL_TABLET | Freq: Every day | ORAL | 0 refills | Status: DC

## 2022-12-08 NOTE — Assessment & Plan Note (Signed)
Chronic, ongoing.  A1c in July 7.5%, will recheck today for pre op purposes.  Continue Rybelsus as is tolerating and will send in 7 MG daily script.  Advised her to alert [place] if too costly or any issues at pharmacy and we will work on assisting, could supply 3 MG sample again if needed. Continue current medication regimen and adjust as needed.  Check BS at least daily and document for visits. - ARB and Statin on board - Eye exam needed.  Foot exam up to date. - Pneumo vaccine up to date.  Recommend flu vaccine.

## 2022-12-08 NOTE — Assessment & Plan Note (Addendum)
Acute and scheduled for surgery upcoming.  Obtain labs today.   RCRI 0.4, however is moderate risk with diabetes and obesity.  Form completed.

## 2022-12-08 NOTE — Therapy (Signed)
OUTPATIENT OCCUPATIONAL THERAPY TREATMENT NOTE   BILATERAL LOWER EXTREMITY LYMPHEDEMA  Patient Name: Sharon Ware MRN: [id number] DOB:[birth year], 72 y.o., female Today's Date: 12/08/2022   END OF SESSION:   OT End of Session - 12/08/22 0914     Visit Number 16    Number of Visits 36    Date for OT Re-Evaluation 12/06/22    OT Start Time 0910    OT Stop Time 1010    OT Time Calculation (min) 60 min    Activity Tolerance Patient tolerated treatment well;No increased pain    Behavior During Therapy WFL for tasks assessed/performed               Past Medical History:  Diagnosis Date   Arthritis    Arthritis of both knees   Breast cancer (HCC)    Cataract    Diabetes mellitus without complication (HCC)    Genitourinary syndrome of menopause    History of kidney stones    Hyperlipidemia    Hypertension    Lymphedema    Malignant neoplasm of upper-inner quadrant of left breast in female, estrogen receptor positive (HCC)    Morbid obesity with BMI of 60.0-69.9, adult (HCC)    Pneumonia    Urinary tract infection due to ESBL Klebsiella    Vitamin D deficiency    Past Surgical History:  Procedure Laterality Date   ABDOMINAL HYSTERECTOMY     x2   BREAST BIOPSY Right 1990s   neg   BREAST BIOPSY Right 2000s   neg   BREAST BIOPSY Left 11/24/2020   u/s bx 10:30/8 cmfn-"heart" marker   BREAST EXCISIONAL BIOPSY Right    neg   BREAST LUMPECTOMY     BREAST LUMPECTOMY WITH RADIOFREQUENCY TAG IDENTIFICATION Left 12/10/2020   Procedure: BREAST LUMPECTOMY WITH RADIOFREQUENCY TAG IDENTIFICATION;  Surgeon: Campbell Lerner, MD;  Location: [facility] ORS;  Service: General;  Laterality: Left;   COLONOSCOPY     DILATION AND CURETTAGE OF UTERUS     EYE SURGERY  2004   cataract surgery and laser surgery   Salpingo-oophorectomy     Patient Active Problem List   Diagnosis Date Noted   Lymphedema 09/25/2022   Hypoxia 09/25/2022   Edema 01/30/2021   Malignant neoplasm of  upper-inner quadrant of left breast in female, estrogen receptor positive (HCC) 12/01/2020   Goals of care, counseling/discussion 12/01/2020   Morbid obesity (HCC) 08/08/2018   BMI 60.0-69.9, adult (HCC) 08/08/2018   Controlled diabetes mellitus type 2 with complications (HCC) 08/08/2018   Hyperlipidemia 08/08/2018   Hypertension associated with diabetes (HCC) 08/08/2018   Arthritis of both knees 08/08/2018   Vitamin D deficiency 08/08/2018   PCP: Olevia Perches, DO  REFERRING PROVIDER: Gwynn Burly, DO  REFERRING DIAG: I89.0  THERAPY DIAG:  Lymphedema, not elsewhere classified [I89.0]  Rationale for Evaluation and Treatment: Rehabilitation  ONSET DATE: 1 yr ago without known precipitating event- by report  SUBJECTIVE:  SUBJECTIVE STATEMENT: Sharon Ware presents for OT treatment to address BLE lymphedema, L>R. Pt is accompanied by her spouse, Sharon Ware, who assists her with transfers and with compression bandaging. Pt denies LE-related leg pain. She arrives seated in a transport wheelchair w ongoing complaints of UTI related pain and arthritis knee pain. Pt  reports she decided to have surgery        to remove kidney stone on sept 6th.  Tobacco Use: Low Risk  (12/08/2022)   Patient History    Smoking Tobacco Use: Never    Smokeless Tobacco Use: Never    Passive Exposure: Never   PERTINENT HISTORY: HTN, Obesity w BMI 60-69.9, DM, B knee arthritis, Hx L breast cancer, recurrent L leg cellulitis. MRSA test results pending  PAIN: Are you having pain? Denies LE related pain. Endorses OA knee pain and kidney stone -related pain  PRECAUTIONS: Fall and Other: lymphedema precautions, DM. Hx L Br Ca  PRIOR LEVEL OF FUNCTION: Independent with household mobility with device, Requires assistive device for  independence, Needs assistance with homemaking, Needs assistance with gait, and Needs assistance with transfers  PATIENT GOALS: ... Get improvement for my condition...., keep lymphedema from getting worse, and reduce infection risk"  LYMPHEDEMA ASSESSMENTS:  FOTO Functional Outcomes Measure: Initial 33%  Lymphedema Life Impact Scale (LLIS): 22.06%  BLE COMPARATIVE LIMB VOLUMETRICS Initial 09/14/22  LANDMARK RIGHT    R LEG (A-D) 7717.1 ml  R THIGH (E-G) ml  R FULL LIMB (A-G) ml  Limb Volume differential (LVD)  %  Volume change since initial %  Volume change overall V  (Blank rows = not tested)  LANDMARK LEFT   L LEG (A-D) 7752.1 ml  L THIGH (E-G) ml  L FULL LIMB (A-G) ml  Limb Volume differential (LVD)  Leg LVD measures 0.5%, L>R   Volume change since initial %  Volume change overall %   (Blank rows = not tested)    BLE COMPARATIVE LIMB VOLUMETRICS 9th visit 11/05/22  LANDMARK LEFT   L LEG (A-D) 5445.7 ml  L THIGH (E-G) ml  L FULL LIMB (A-G) ml  Limb Volume differential (LVD)    Volume change since initial  L LEG reduced in volume by 29.43% since 09/14/22  Volume change overall %  (Blank rows = not tested)   LLE COMPARATIVE LIMB VOLUMETRICS 16 th visit 12/08/22  LANDMARK LEFT   L LEG (A-D) 5445.7 ml  L THIGH (E-G) ml  L FULL LIMB (A-G) ml  Limb Volume differential (LVD)    Volume change since initial  L LEG is INCREASED in volume by 2.36% today, which is essentially stable since last measured on 11/05/22  Volume change overall  L LEG reduced in volume by 29.43% since 09/14/22  (Blank rows = not tested)   TODAY'S TREATMENT:  LLE volumetrics Knee length LLE multilayer compression wraps- as established MLD as established to LLE  PATIENT EDUCATION:  Continued Pt/ CG edu for lymphedema self care home program throughout session. Topics  include outcome of comparative limb volumetrics- starting limb volume differentials (LVDs), technology and gradient techniques used for short stretch, multilayer compression wrapping, simple self-MLD, therapeutic lymphatic pumping exercises, skin/nail care, LE precautions,. compression garment recommendations and specifications, wear and care schedule and compression garment donning / doffing w assistive devices. Discussed progress towards all OT goals since commencing CDT. All questions answered to the Pt's satisfaction. Good return. Person educated: Patient and Spouse Education method: Explanation, Demonstration, and Handouts Education comprehension: verbalized understanding, returned demonstration, verbal cues required, and needs further education  Lymphedema HOME PROGRAM:Complete Decongestive Therapy (CDT) Self-Management Phase Daily simple self-MLD Daily BLE skin care and inspection Daily lymphatic pumping there ex Appropriate daily compression- knee length, will require max caregiver A to  and doff Leg elevation when seated  ASSESSMENT: CLINICAL IMPRESSION:   Emphasis of session on INITIAL  compression garment  and device fitting for LLE.  Comparative limb volumetrics for L LEG  demonstrate L LEG volume has remained relatively stable since last measured with a 2% fluctuation upward. These fluctuations are typical once volume reduction plateaus. INITIAL L custom knee high fits well and Pt reports comfort in garment. Trained Pt and spouse in donning techniques using assistive devices. Provided training for garment and device wear and care as well as precautions. Pt will be out next week with kidney stone procedure, so she Cx OT. She or spouse will call with status report on garment. OT will request additional if garments are without problems, or we'll complete new measurements for a remake PRN before ordering remaining 2 L knee highs. OT notified DME vendor that incorrect garment was sent for [employer], and Pt did not receive her device. Cont as per POC.  OBJECTIVE IMPAIRMENTS: Abnormal gait, decreased activity tolerance, decreased balance, decreased endurance, decreased knowledge of condition, decreased knowledge of use of DME, decreased mobility, difficulty walking, decreased ROM, decreased strength, increased edema, impaired perceived functional ability, postural dysfunction, obesity, pain, and chronic progressive limb swelling with elevated infection risk and risk of non-healing wounds .   ACTIVITY LIMITATIONS: carrying, lifting, bending, sitting, standing, squatting, sleeping, stairs, transfers, bed mobility, bathing, toileting, dressing, hygiene/grooming, and caring for others  PARTICIPATION LIMITATIONS: meal prep, cleaning, laundry, driving, shopping, community activity, occupation, yard work, and church  PERSONAL FACTORS: 3+ comorbidities: HTN, Stage III Obesity, B knee arthritis pain and inflammation and impaired AROM 2/2 body habitus  are also affecting patient's functional outcome.   REHAB POTENTIAL:  Fair with daily CG assistance throughout Intensive Phase CDT.  Poor without daily CG assistance with all LE self care home program components  GOALS: Goals reviewed with patient? Yes  SHORT TERM GOALS: Target date: 4th OT Rx visit   Pt will demonstrate understanding of lymphedema precautions and prevention strategies with modified independence using a printed reference to identify at least 5 precautions and discussing how s/he may implement them into daily life to reduce risk of progression with modified assistance ( printed reference). Baseline: modified independence ( printed reference) Goal status: 11/05/22 GOAL MET  2.  Pt will be able to apply multilayer, thigh length, compression wraps using gradient techniques with Max caregiver assistance to decrease limb volume, to limit infection risk, and to limit lymphedema progression.  Baseline: Max A Goal status: 11/05/22 GOAL  MET  LONG TERM GOALS: Target  date: 12/06/22  Given this patient's Intake score of 22.06 % on the Lymphedema Life Impact Scale (LLIS), patient will experience a reduction of at least 5 points in her perceived level of functional impairment resulting from lymphedema to improve functional performance and quality of life (QOL). Baseline: 22.06 % Goal status ONGOING-PROGRESSING  2.  Given this patient's Intake score of 33 % on the functional outcomes FOTO tool, patient will experience an increase in function of 3 points to improve basic and instrumental ADLs performance, including lymphedema self-care.  Baseline: 33 % Goal status: ONGOING-PROGRESSING  3.  During Intensive phase CDT Pt will achieve at least 85% compliance with all lymphedema self-care home program components, including  daily skin care, multilayer , gradient compression wraps with daily changes, daily simple self MLD and daily lymphatic pumping therex to achieve optimal clinical outcome and to habituate self care regime for optimal LE self-management over time. Baseline: Dependent Goal status:11/05/22 GOAL MET  4.  Pt will achieve at least a 10% volume reductions bilaterally below the knees to return limb to more typical size and shape, to limit infection risk and LE progression, to decrease pain, to improve function, and to improve body image and QOL. Baseline: Dependent Goal status:11/05/22 GOAL for L LEG w 29% reduction to date.  5.  Pt will be able to don and doff appropriate compression garments and devices using assistive devices and extra time and Max CG assistance within 1 week of issue date for optimal lymphedema self-care. Baseline: Dependent Goal status: ONGOING PLAN:  PT FREQUENCY: 2 x/week  PT DURATION: 12  weeks other: and PRN  PLANNED INTERVENTIONS: Therapeutic exercises, Therapeutic activity, Patient/Family education, Self Care, DME instructions, Manual lymph drainage, Compression bandaging, Manual therapy, and  skin care during MLD, fit with appropriate custom compression garments and devices, fit with appropriate compression device  which  follows lymphatic pathways and anatomic distribution  PLAN FOR NEXT SESSION:  Finish assessing LLE garment fit and function Commence RLE/RLQ CDT including  RLE multilayer compression bandages RLE MLD w simultaneous skin care  Loel Dubonnet, MS, OTR/L, CLT-LANA 12/08/22 12:58 PM

## 2022-12-08 NOTE — Progress Notes (Addendum)
BP 138/71   Pulse 97   Temp 98.1 F (36.7 C) (Oral)   Ht 5\' 3"  (1.6 m)   Wt (!) 339 lb (153.8 kg)   SpO2 94%   BMI 60.05 kg/m    Subjective:    Patient ID: Sharon Ware, female    DOB: 12-Mar-1951, 72 y.o.   MRN: [id number]  HPI: Sharon Ware is a 72 y.o. female  Chief Complaint  Patient presents with   Surgery Clearance   Husband at bedside to assist with HPI.  KIDNEY STONES Going to surgery for this on 12/10/22 with urology -- cystoscopy/stent placement.  Needs clearance for this -- had recent EKG in pre-admission reviewed by cardiology.  Recent A1c 7.5%, was start on Rybelsus via 3 MG samples -- only recently started these, but currently has  completed sample and is tolerating would like script sent to pharmacy for 7 MG dosing which she reports she was to increase to next per her PCP. Dysuria: burning Urinary frequency: yes Urgency: yes Small volume voids: no Symptom severity: yes Urinary incontinence: no Foul odor: no Hematuria: yes Abdominal pain: no Back pain: yes Suprapubic pain/pressure: no Flank pain: yes Fever:  no Vomiting: no Status: stable  Relevant past medical, surgical, family and social history reviewed and updated as indicated. Interim medical history since our last visit reviewed. Allergies and medications reviewed and updated.  Review of Systems  Constitutional:  Negative for activity change, appetite change, diaphoresis, fatigue and fever.  Respiratory:  Negative for cough, chest tightness and shortness of breath.   Cardiovascular:  Negative for chest pain, palpitations and leg swelling.  Endocrine: Negative for polydipsia, polyphagia and polyuria.  Genitourinary:  Positive for dysuria, frequency and urgency.  Neurological: Negative.   Psychiatric/Behavioral: Negative.      Per HPI unless specifically indicated above     Objective:    BP 138/71   Pulse 97   Temp 98.1 F (36.7 C) (Oral)   Ht 5\' 3"  (1.6 m)   Wt (!) 339 lb  (153.8 kg)   SpO2 94%   BMI 60.05 kg/m   Wt Readings from Last 3 Encounters:  12/08/22 (!) 339 lb (153.8 kg)  11/13/22 (!) 354 lb 15.1 oz (161 kg)  10/19/22 (!) 355 lb (161 kg)    Physical Exam Vitals and nursing note reviewed.  Constitutional:      General: She is awake. She is not in acute distress.    Appearance: She is well-developed and well-groomed. She is obese. She is not ill-appearing or toxic-appearing.  HENT:     Head: Normocephalic.     Right Ear: Hearing and external ear normal.     Left Ear: Hearing and external ear normal.  Eyes:     General: Lids are normal.        Right eye: No discharge.        Left eye: No discharge.     Conjunctiva/sclera: Conjunctivae normal.     Pupils: Pupils are equal, round, and reactive to light.  Neck:     Thyroid: No thyromegaly.     Vascular: No carotid bruit.  Cardiovascular:     Rate and Rhythm: Normal rate and regular rhythm.     Heart sounds: Normal heart sounds. No murmur heard.    No gallop.  Pulmonary:     Effort: Pulmonary effort is normal. No accessory muscle usage or respiratory distress.     Breath sounds: Normal breath sounds.  Abdominal:  General: Bowel sounds are normal. There is no distension.     Palpations: Abdomen is soft.     Tenderness: There is no abdominal tenderness.  Musculoskeletal:     Cervical back: Normal range of motion and neck supple.     Right lower leg: 3+ Edema present.     Left lower leg: 3+ Edema present.  Lymphadenopathy:     Cervical: No cervical adenopathy.  Skin:    General: Skin is warm and dry.  Neurological:     Mental Status: She is alert and oriented to person, place, and time.     Deep Tendon Reflexes: Reflexes are normal and symmetric.     Reflex Scores:      Brachioradialis reflexes are 2+ on the right side and 2+ on the left side.      Patellar reflexes are 2+ on the right side and 2+ on the left side. Psychiatric:        Attention and Perception: Attention normal.         Mood and Affect: Mood normal.        Speech: Speech normal.        Behavior: Behavior normal. Behavior is cooperative.        Thought Content: Thought content normal.     Results for orders placed or performed during the hospital encounter of 12/01/22  Potassium  Result Value Ref Range   Potassium 4.4 3.5 - 5.1 mmol/L      Assessment & Plan:   Problem List Items Addressed This Visit       Endocrine   Controlled diabetes mellitus type 2 with complications (HCC) - Primary    Chronic, ongoing.  A1c in July 7.5%, will recheck today for pre op purposes.  Continue Rybelsus as is tolerating and will send in 7 MG daily script.  Advised her to alert [place] if too costly or any issues at pharmacy and we will work on assisting, could supply 3 MG sample again if needed. Continue current medication regimen and adjust as needed.  Check BS at least daily and document for visits. - ARB and Statin on board - Eye exam needed.  Foot exam up to date. - Pneumo vaccine up to date.  Recommend flu vaccine.      Relevant Medications   Semaglutide (RYBELSUS) 7 MG TABS   Other Relevant Orders   CBC with Differential/Platelet   Comp Met (CMET)   HgB A1c     Genitourinary   Kidney stones    Acute and scheduled for surgery upcoming.  Obtain labs today.   RCRI 0.4, however is moderate risk with diabetes and obesity.  Form completed.        Follow up plan: Return for as scheduled in October.

## 2022-12-09 ENCOUNTER — Encounter: Payer: Self-pay | Admitting: Urology

## 2022-12-09 LAB — COMPREHENSIVE METABOLIC PANEL
ALT: 23 IU/L (ref 0–32)
AST: 20 IU/L (ref 0–40)
Albumin: 4 g/dL (ref 3.8–4.8)
Alkaline Phosphatase: 64 IU/L (ref 44–121)
BUN/Creatinine Ratio: 24 (ref 12–28)
BUN: 24 mg/dL (ref 8–27)
Bilirubin Total: 0.4 mg/dL (ref 0.0–1.2)
CO2: 22 mmol/L (ref 20–29)
Calcium: 9 mg/dL (ref 8.7–10.3)
Chloride: 96 mmol/L (ref 96–106)
Creatinine, Ser: 1.01 mg/dL — ABNORMAL HIGH (ref 0.57–1.00)
Globulin, Total: 3.1 g/dL (ref 1.5–4.5)
Glucose: 224 mg/dL — ABNORMAL HIGH (ref 70–99)
Potassium: 4.3 mmol/L (ref 3.5–5.2)
Sodium: 136 mmol/L (ref 134–144)
Total Protein: 7.1 g/dL (ref 6.0–8.5)
eGFR: 58 mL/min/{1.73_m2} — ABNORMAL LOW (ref >59)

## 2022-12-09 LAB — CBC WITH DIFFERENTIAL/PLATELET
Basophils Absolute: 0.1 10*3/uL (ref 0.0–0.2)
Basos: 1 %
EOS (ABSOLUTE): 0.2 10*3/uL (ref 0.0–0.4)
Eos: 2 %
Hematocrit: 44.7 % (ref 34.0–46.6)
Hemoglobin: 14.7 g/dL (ref 11.1–15.9)
Immature Grans (Abs): 0.1 10*3/uL (ref 0.0–0.1)
Immature Granulocytes: 1 %
Lymphocytes Absolute: 4.8 10*3/uL — ABNORMAL HIGH (ref 0.7–3.1)
Lymphs: 46 %
MCH: 30.2 pg (ref 26.6–33.0)
MCHC: 32.9 g/dL (ref 31.5–35.7)
MCV: 92 fL (ref 79–97)
Monocytes Absolute: 0.7 10*3/uL (ref 0.1–0.9)
Monocytes: 6 %
Neutrophils Absolute: 4.5 10*3/uL (ref 1.4–7.0)
Neutrophils: 44 %
Platelets: 235 10*3/uL (ref 150–450)
RBC: 4.87 x10E6/uL (ref 3.77–5.28)
RDW: 13.5 % (ref 11.7–15.4)
WBC: 10.4 10*3/uL (ref 3.4–10.8)

## 2022-12-09 LAB — HEMOGLOBIN A1C
Est. average glucose Bld gHb Est-mCnc: 209 mg/dL
Hgb A1c MFr Bld: 8.9 % — ABNORMAL HIGH (ref 4.8–5.6)

## 2022-12-09 MED ORDER — GENTAMICIN SULFATE 40 MG/ML IJ SOLN
5.0000 mg/kg | INTRAVENOUS | Status: AC
  Administered 2022-12-10: 810 mg via INTRAVENOUS
  Filled 2022-12-09: qty 20.25

## 2022-12-09 MED ORDER — GENTAMICIN SULFATE 40 MG/ML IJ SOLN
5.0000 mg/kg | INTRAVENOUS | Status: DC
  Filled 2022-12-09: qty 20.25

## 2022-12-09 NOTE — Progress Notes (Signed)
Perioperative Services Pre-Admission/Anesthesia Testing    Date: 12/09/22  Name: Sharon Ware MRN:   [id number]  Re: Brief PAT clinical review for BMI  Planned Surgical Procedure(s):    Case: [id number] Date/Time: 12/10/22 0907   Procedure: CYSTOSCOPY/URETEROSCOPY/HOLMIUM LASER/STENT PLACEMENT (Right)   Anesthesia type: General   Pre-op diagnosis: Right Nephrolithiasis   Location: [facility] OR ROOM 10 / [facility]   Surgeons: Sondra Come, MD      Clinical Notes:  Patient is scheduled for the above procedure on 12/10/2022 with Dr. Legrand Rams, MD. Patient submitted for preliminary review by PAT APP. Patient with a BMI of  60.05 kg/m,which places her at increased risk of perioperative complications.   Significant PMH: ascending TAA, HTN, HLD, T2DM, hiatal hernia, aortic atherosclerosis, breast cancer (on letrozole), lymphedema, OA, recurrent UTI (current ESBL-EC)  Patient advises that she does not have any type of known issues with her cervical or lumbar spine.  She has not had any type of surgical procedures, whether with or without hardware placement, on her neck or lower back.   Patient denies previous perioperative complications with anesthesia in the past.   She has never experienced difficulties with endotracheal intubation. She advises that she has never been advised by a medical provider that she had a challenging airway due to her anatomy.   Patient reports that she has never undergone an epidural or neuraxial anesthetic course is the past.  Patient has never been diagnosed with OSAH syndrome requiring the use of prescribed nocturnal PAP therapy.   In review the patient's available EMR, it is noted that she underwent a general anesthetic course here at [facility] (ASA III) in 12/2020 with no documented complications. At that time, her thyromental distance was documented as being > 3 FB and her mallampati score  was III.   Impression and Plan:  Sharon Ware is scheduled for elective surgery here at [facility] in the near future. EMR has been reviewed. Patient has no significant cardiovascular/cardiopulmonary conditions that would require clearance from a specialty provider. With that being said, given her current BMI of  60.05 kg/m, coupled with the PMH that she does have, clearance to proceed was sought from patient's primary care provider. Per [place], NP-C, "patient had a baseline EKG on pre-admission noted. Will obtain labs today. Last A1c was 7.5%. Overall she is stable. Her main risk is diabetes and obesity. RCRI 0.4 (cardiac). Patient is optimized for surgery and may proceed at an overall MODERATE risk of perioperative complications".   Based on brief clinical review performed today (12/09/22), barring any significant acute changes in the patient's overall condition, it is anticipated that she will be able to proceed with the planned surgical intervention. Any acute changes in clinical condition may necessitate her procedure being postponed and/or cancelled. Patient will meet with anesthesia team (MD and/or CRNA) on this day of her procedure for preoperative evaluation/assessment.   Pre-surgical instructions were reviewed with the patient during her PAT appointment and questions were fielded by PAT clinical staff. Patient was advised that if any questions or concerns arise prior to her procedure then she should return a call to PAT and/or her surgeon's office to discuss.  Quentin Mulling, MSN, APRN, FNP-C, CEN [facility]  Peri-operative Services Nurse Practitioner Phone: [phone] 12/09/22 10:45 AM  NOTE: This note has been prepared using Dragon dictation software. Despite my best ability to proofread, there is always  the potential that unintentional transcriptional errors may still occur from this process.

## 2022-12-09 NOTE — Progress Notes (Signed)
Contacted via MyChart   Good afternoon Tahliah, your labs have returned and are overall stable with exception of trend up in A1c.  Were you able to get the 7 MG Rybelsus?  Let us know.  Start this if so.  Kidney function continues to show stable chronic kidney disease stage 3a with no worsening.  Good news.  Any questions? Keep October visit with Dr. Laural Benes and she may further adjust regimen. Keep being amazing!!  Thank you for allowing me to participate in your care.  I appreciate you. Kindest regards, Dominic Rhome

## 2022-12-10 ENCOUNTER — Encounter: Payer: Medicare Other | Admitting: Occupational Therapy

## 2022-12-10 ENCOUNTER — Ambulatory Visit: Payer: Medicare Other | Admitting: Urgent Care

## 2022-12-10 ENCOUNTER — Ambulatory Visit: Payer: Medicare Other

## 2022-12-10 ENCOUNTER — Encounter
Admission: RE | Disposition: A | Discharge status: Home / Self Care | Payer: Self-pay | Source: Home / Self Care | Attending: Urology

## 2022-12-10 ENCOUNTER — Encounter: Payer: Self-pay | Admitting: Urology

## 2022-12-10 ENCOUNTER — Other Ambulatory Visit: Payer: Self-pay

## 2022-12-10 ENCOUNTER — Ambulatory Visit
Admission: RE | Admit: 2022-12-10 | Discharge: 2022-12-10 | Disposition: A | Discharge status: Home / Self Care | Payer: Medicare Other | Attending: Urology | Admitting: Urology

## 2022-12-10 DIAGNOSIS — I10 Essential (primary) hypertension: Secondary | ICD-10-CM | POA: Insufficient documentation

## 2022-12-10 DIAGNOSIS — Z853 Personal history of malignant neoplasm of breast: Secondary | ICD-10-CM | POA: Diagnosis not present

## 2022-12-10 DIAGNOSIS — N200 Calculus of kidney: Secondary | ICD-10-CM | POA: Diagnosis present

## 2022-12-10 DIAGNOSIS — Z170 Estrogen receptor positive status [ER+]: Secondary | ICD-10-CM | POA: Insufficient documentation

## 2022-12-10 DIAGNOSIS — Z6841 Body Mass Index (BMI) 40.0 and over, adult: Secondary | ICD-10-CM | POA: Diagnosis not present

## 2022-12-10 DIAGNOSIS — Z87442 Personal history of urinary calculi: Secondary | ICD-10-CM | POA: Diagnosis not present

## 2022-12-10 DIAGNOSIS — I700 Atherosclerosis of aorta: Secondary | ICD-10-CM | POA: Insufficient documentation

## 2022-12-10 DIAGNOSIS — E119 Type 2 diabetes mellitus without complications: Secondary | ICD-10-CM | POA: Insufficient documentation

## 2022-12-10 DIAGNOSIS — Z87440 Personal history of urinary (tract) infections: Secondary | ICD-10-CM | POA: Insufficient documentation

## 2022-12-10 DIAGNOSIS — E118 Type 2 diabetes mellitus with unspecified complications: Secondary | ICD-10-CM

## 2022-12-10 HISTORY — DX: Bilateral primary osteoarthritis of knee: M17.0

## 2022-12-10 HISTORY — DX: Type 2 diabetes mellitus without complications: E11.9

## 2022-12-10 HISTORY — DX: Long term (current) use of aromatase inhibitors: Z79.811

## 2022-12-10 HISTORY — DX: Long term (current) use of aspirin: Z79.82

## 2022-12-10 HISTORY — PX: CYSTOSCOPY/URETEROSCOPY/HOLMIUM LASER/STENT PLACEMENT: SHX6546

## 2022-12-10 HISTORY — DX: Atherosclerosis of aorta: I70.0

## 2022-12-10 HISTORY — DX: Umbilical hernia without obstruction or gangrene: K42.9

## 2022-12-10 HISTORY — DX: Diaphragmatic hernia without obstruction or gangrene: K44.9

## 2022-12-10 LAB — GLUCOSE, CAPILLARY
Glucose-Capillary: 200 mg/dL — ABNORMAL HIGH (ref 70–99)
Glucose-Capillary: 183 mg/dL — ABNORMAL HIGH (ref 70–99)

## 2022-12-10 SURGERY — CYSTOSCOPY/URETEROSCOPY/HOLMIUM LASER/STENT PLACEMENT
Anesthesia: General | Site: Ureter | Laterality: Right

## 2022-12-10 MED ORDER — IOHEXOL 180 MG/ML  SOLN
INTRAMUSCULAR | Status: DC | PRN
  Administered 2022-12-10: 10 mL

## 2022-12-10 MED ORDER — DEXAMETHASONE SODIUM PHOSPHATE 10 MG/ML IJ SOLN
INTRAMUSCULAR | Status: DC | PRN
  Administered 2022-12-10: 5 mg via INTRAVENOUS

## 2022-12-10 MED ORDER — FENTANYL CITRATE (PF) 100 MCG/2ML IJ SOLN
INTRAMUSCULAR | Status: AC
  Filled 2022-12-10: qty 2

## 2022-12-10 MED ORDER — FAMOTIDINE 20 MG PO TABS
20.0000 mg | ORAL_TABLET | Freq: Once | ORAL | Status: AC
  Administered 2022-12-10: 20 mg via ORAL

## 2022-12-10 MED ORDER — FENTANYL CITRATE (PF) 100 MCG/2ML IJ SOLN: 25.0000 ug | INTRAMUSCULAR | Status: DC | PRN

## 2022-12-10 MED ORDER — LIDOCAINE HCL (CARDIAC) PF 100 MG/5ML IV SOSY
PREFILLED_SYRINGE | INTRAVENOUS | Status: DC | PRN
  Administered 2022-12-10: 100 mg via INTRAVENOUS

## 2022-12-10 MED ORDER — ORAL CARE MOUTH RINSE: 15.0000 mL | Freq: Once | OROMUCOSAL | Status: AC

## 2022-12-10 MED ORDER — ACETAMINOPHEN 10 MG/ML IV SOLN
INTRAVENOUS | Status: AC
  Filled 2022-12-10: qty 100

## 2022-12-10 MED ORDER — ROCURONIUM BROMIDE 100 MG/10ML IV SOLN
INTRAVENOUS | Status: DC | PRN
  Administered 2022-12-10: 30 mg via INTRAVENOUS

## 2022-12-10 MED ORDER — ACETAMINOPHEN 10 MG/ML IV SOLN: 1000.0000 mg | Freq: Once | INTRAVENOUS | Status: DC | PRN

## 2022-12-10 MED ORDER — SODIUM CHLORIDE 0.9 % IR SOLN
Status: DC | PRN
  Administered 2022-12-10: 3000 mL

## 2022-12-10 MED ORDER — OXYCODONE HCL 5 MG/5ML PO SOLN: 5.0000 mg | Freq: Once | ORAL | Status: DC | PRN

## 2022-12-10 MED ORDER — PHENYLEPHRINE 80 MCG/ML (10ML) SYRINGE FOR IV PUSH (FOR BLOOD PRESSURE SUPPORT)
PREFILLED_SYRINGE | INTRAVENOUS | Status: AC
  Filled 2022-12-10: qty 10

## 2022-12-10 MED ORDER — DEXAMETHASONE SODIUM PHOSPHATE 10 MG/ML IJ SOLN
INTRAMUSCULAR | Status: AC
  Filled 2022-12-10: qty 1

## 2022-12-10 MED ORDER — ONDANSETRON HCL 4 MG/2ML IJ SOLN: 4.0000 mg | Freq: Once | INTRAMUSCULAR | Status: DC | PRN

## 2022-12-10 MED ORDER — ONDANSETRON HCL 4 MG/2ML IJ SOLN
INTRAMUSCULAR | Status: AC
  Filled 2022-12-10: qty 2

## 2022-12-10 MED ORDER — PROPOFOL 10 MG/ML IV BOLUS
INTRAVENOUS | Status: DC | PRN
  Administered 2022-12-10: 120 mg via INTRAVENOUS

## 2022-12-10 MED ORDER — ROCURONIUM BROMIDE 10 MG/ML (PF) SYRINGE
PREFILLED_SYRINGE | INTRAVENOUS | Status: AC
  Filled 2022-12-10: qty 10

## 2022-12-10 MED ORDER — FAMOTIDINE 20 MG PO TABS
ORAL_TABLET | ORAL | Status: AC
  Filled 2022-12-10: qty 1

## 2022-12-10 MED ORDER — SUCCINYLCHOLINE CHLORIDE 200 MG/10ML IV SOSY
PREFILLED_SYRINGE | INTRAVENOUS | Status: AC
  Filled 2022-12-10: qty 10

## 2022-12-10 MED ORDER — ONDANSETRON HCL 4 MG/2ML IJ SOLN
INTRAMUSCULAR | Status: DC | PRN
  Administered 2022-12-10: 4 mg via INTRAVENOUS

## 2022-12-10 MED ORDER — MIDAZOLAM HCL 2 MG/2ML IJ SOLN
INTRAMUSCULAR | Status: AC
  Filled 2022-12-10: qty 2

## 2022-12-10 MED ORDER — LACTATED RINGERS IV SOLN: INTRAVENOUS | Status: DC | PRN

## 2022-12-10 MED ORDER — AMOXICILLIN-POT CLAVULANATE 875-125 MG PO TABS: 1.0000 | ORAL_TABLET | Freq: Two times a day (BID) | ORAL | 0 refills | Status: DC

## 2022-12-10 MED ORDER — TRAMADOL HCL 50 MG PO TABS
50.0000 mg | ORAL_TABLET | Freq: Four times a day (QID) | ORAL | 0 refills | Status: AC | PRN
Start: 2022-12-10 — End: 2022-12-13

## 2022-12-10 MED ORDER — FENTANYL CITRATE (PF) 100 MCG/2ML IJ SOLN
INTRAMUSCULAR | Status: DC | PRN
  Administered 2022-12-10 (×2): 50 ug via INTRAVENOUS

## 2022-12-10 MED ORDER — ACETAMINOPHEN 10 MG/ML IV SOLN
INTRAVENOUS | Status: DC | PRN
  Administered 2022-12-10: 1000 mg via INTRAVENOUS

## 2022-12-10 MED ORDER — CHLORHEXIDINE GLUCONATE 0.12 % MT SOLN
15.0000 mL | Freq: Once | OROMUCOSAL | Status: AC
  Administered 2022-12-10: 15 mL via OROMUCOSAL

## 2022-12-10 MED ORDER — SUCCINYLCHOLINE CHLORIDE 200 MG/10ML IV SOSY
PREFILLED_SYRINGE | INTRAVENOUS | Status: DC | PRN
  Administered 2022-12-10: 120 mg via INTRAVENOUS

## 2022-12-10 MED ORDER — CHLORHEXIDINE GLUCONATE 0.12 % MT SOLN
OROMUCOSAL | Status: AC
  Filled 2022-12-10: qty 15

## 2022-12-10 MED ORDER — LACTATED RINGERS IV SOLN: INTRAVENOUS | Status: DC

## 2022-12-10 MED ORDER — PHENYLEPHRINE HCL (PRESSORS) 10 MG/ML IV SOLN
INTRAVENOUS | Status: DC | PRN
  Administered 2022-12-10 (×3): 80 ug via INTRAVENOUS

## 2022-12-10 MED ORDER — OXYCODONE HCL 5 MG PO TABS: 5.0000 mg | ORAL_TABLET | Freq: Once | ORAL | Status: DC | PRN

## 2022-12-10 MED ORDER — PROPOFOL 10 MG/ML IV BOLUS
INTRAVENOUS | Status: AC
  Filled 2022-12-10: qty 20

## 2022-12-10 MED ORDER — SUGAMMADEX SODIUM 200 MG/2ML IV SOLN
INTRAVENOUS | Status: DC | PRN
  Administered 2022-12-10: 300 mg via INTRAVENOUS

## 2022-12-10 MED ORDER — STERILE WATER FOR IRRIGATION IR SOLN
Status: DC | PRN
Start: 2022-12-10 — End: 2022-12-10
  Administered 2022-12-10: 500 mL

## 2022-12-10 SURGICAL SUPPLY — 32 items
ADH LQ OCL WTPRF AMP STRL LF (MISCELLANEOUS)
ADHESIVE MASTISOL STRL (MISCELLANEOUS) IMPLANT
BAG DRAIN SIEMENS DORNER NS (MISCELLANEOUS) ×1 IMPLANT
BAG DRN NS LF (MISCELLANEOUS) ×1
BAG PRESSURE INF REUSE 3000 (BAG) ×1 IMPLANT
BRUSH SCRUB EZ 1% IODOPHOR (MISCELLANEOUS) ×1 IMPLANT
CATH URET FLEX-TIP 2 LUMEN 10F (CATHETERS) IMPLANT
CATH URETL OPEN 5X70 (CATHETERS) IMPLANT
CNTNR URN SCR LID CUP LEK RST (MISCELLANEOUS) IMPLANT
CONT SPEC 4OZ STRL OR WHT (MISCELLANEOUS)
DRAPE UTILITY 15X26 TOWEL STRL (DRAPES) ×1 IMPLANT
DRSG TEGADERM 2-3/8X2-3/4 SM (GAUZE/BANDAGES/DRESSINGS) IMPLANT
FIBER LASER MOSES 200 DFL (Laser) IMPLANT
FIBER LASER MOSES 365 DFL (Laser) IMPLANT
GLOVE BIOGEL PI IND STRL 7.5 (GLOVE) ×1 IMPLANT
GOWN STRL REUS W/ TWL LRG LVL3 (GOWN DISPOSABLE) ×1 IMPLANT
GOWN STRL REUS W/ TWL XL LVL3 (GOWN DISPOSABLE) ×1 IMPLANT
GOWN STRL REUS W/TWL LRG LVL3 (GOWN DISPOSABLE) ×1
GOWN STRL REUS W/TWL XL LVL3 (GOWN DISPOSABLE) ×1
GUIDEWIRE STR DUAL SENSOR (WIRE) ×1 IMPLANT
IV NS IRRIG 3000ML ARTHROMATIC (IV SOLUTION) ×1 IMPLANT
KIT TURNOVER CYSTO (KITS) ×1 IMPLANT
PACK CYSTO AR (MISCELLANEOUS) ×1 IMPLANT
SET CYSTO W/LG BORE CLAMP LF (SET/KITS/TRAYS/PACK) ×1 IMPLANT
SHEATH NAVIGATOR HD 12/14X36 (SHEATH) IMPLANT
STENT URET 6FRX22 CONTOUR (STENTS) IMPLANT
STENT URET 6FRX24 CONTOUR (STENTS) IMPLANT
STENT URET 6FRX26 CONTOUR (STENTS) IMPLANT
SURGILUBE 2OZ TUBE FLIPTOP (MISCELLANEOUS) ×1 IMPLANT
SYR 10ML LL (SYRINGE) ×1 IMPLANT
VALVE UROSEAL ADJ ENDO (VALVE) IMPLANT
WATER STERILE IRR 500ML POUR (IV SOLUTION) ×1 IMPLANT

## 2022-12-10 NOTE — Discharge Instructions (Signed)
AMBULATORY SURGERY  DISCHARGE INSTRUCTIONS   The drugs that you were given will stay in your system until tomorrow so for the next 24 hours you should not:  Drive an automobile Make any legal decisions Drink any alcoholic beverage   You may resume regular meals tomorrow.  Today it is better to start with liquids and gradually work up to solid foods.  You may eat anything you prefer, but it is better to start with liquids, then soup and crackers, and gradually work up to solid foods.   Please notify your doctor immediately if you have any unusual bleeding, trouble breathing, redness and pain at the surgery site, drainage, fever, or pain not relieved by medication.    Additional Instructions: 

## 2022-12-10 NOTE — Op Note (Signed)
Date of procedure: 12/10/22  Preoperative diagnosis:  Right renal pelvis stone  Postoperative diagnosis:  Same  Procedure: Cystoscopy, right ureteroscopy, laser lithotripsy, right retrograde pyelogram with intraoperative interpretation, right ureteral stent placement  Surgeon: Legrand Rams, MD  Anesthesia: General  Complications: None  Intraoperative findings:  Normal cystoscopy, ureteral orifices orthotopic bilaterally Uncomplicated dusting of 1 cm right renal pelvis stone as well as smaller midpole stones, all fragments smaller than laser fiber at conclusion of case Uncomplicated stent placement  EBL: Minimal  Specimens: None  Drains: 6 French by 22 cm ureteral stent  Indication: Sharon Ware is a 72 y.o. patient with 1 cm uric acid right renal pelvis stone who failed a trial of dissolution therapy with potassium citrate, has continued to have intermittent right-sided flank pain and recurrent UTIs.  After reviewing the management options for treatment, they elected to proceed with the above surgical procedure(s). We have discussed the potential benefits and risks of the procedure, side effects of the proposed treatment, the likelihood of the patient achieving the goals of the procedure, and any potential problems that might occur during the procedure or recuperation. Informed consent has been obtained.  Description of procedure:  The patient was taken to the operating room and general anesthesia was induced.  SCDs were not placed in the setting of patient's severe lymphedema with wraps in place. The patient was placed in the dorsal lithotomy position, prepped and draped in the usual sterile fashion, and preoperative antibiotics(gentamicin) were administered. A preoperative time-out was performed.   A 21 French rigid cystoscope was used to intubate the urethra and thorough cystoscopy was performed.  The bladder was grossly normal.  A sensor wire advanced easily into the right  ureteral orifice and passed up to the kidney under fluoroscopic vision.  A dual-lumen ureteral access catheter was advanced over the wire to the kidney, and a second safety sensor wire was added.  A 12/14 French by 36 cm ureteral access sheath was advanced over the wire up to the kidney under fluoroscopic vision.  A digital single-channel flexible ureteroscope was advanced through the scope and thorough pyeloscopy showed a 1 cm stone in the renal pelvis, and 2 smaller stones in the mid and lower pole.  A 365 m laser fiber on settings of 0.3 J and 80 Hz was used to methodically dust all stones in the kidney.  Thorough pyeloscopy showed no stones larger than the laser fiber at the conclusion of the case.  A retrograde pyelogram was performed from the proximal ureter which showed no extravasation or filling defects.  The sheath was removed and careful pullback ureteroscopy showed no ureteral injury or residual fragments.  The rigid cystoscope was backloaded over the wire and a 6 [place] by 22 cm ureteral stent was uneventfully placed with a curl in the kidney, as well as in the bladder.  The bladder was drained, the Dangler was secured to the suprapubic region with Mastisol and Tegaderm, and this concluded our procedure.  Disposition: Stable to PACU  Plan: Augmentin twice daily x 6 days in setting of recurrent infections Remove stent at home on Thursday morning Follow-up in clinic in 1 month for symptom check  Legrand Rams, MD

## 2022-12-10 NOTE — H&P (Signed)
12/10/22 8:59 AM   Sharon Ware Sep 04, 1950 [id number]  CC: Right renal stone  HPI: Extremely comorbid 72 year old female with large 1 cm right renal stone, appears to be uric acid based on density and urine pH, and recurrent infections as well as right-sided flank pain.  With her comorbidities she deferred surgery and opted for trial of dissolution therapy with potassium citrate, but has had only minimal change in the stone and opted for ureteroscopy.  She has also been followed by infectious disease, who feels this is more related to colonization.   PMH: Past Medical History:  Diagnosis Date   Aortic atherosclerosis (HCC)    Arthritis of both knees    Breast cancer (HCC)    Cataract    Genitourinary syndrome of menopause    Hiatal hernia    History of kidney stones    Hyperlipidemia    Hypertension    Long term current use of aromatase inhibitor    a.) letrozole   Long term current use of aspirin    Lymphedema    Malignant neoplasm of upper-inner quadrant of left breast in female, estrogen receptor positive (HCC)    Morbid obesity with BMI of 60.0-69.9, adult (HCC)    Pneumonia    T2DM (type 2 diabetes mellitus) (HCC)    Thoracic ascending aortic aneurysm (HCC) 09/25/2022   a.) CT chest 09/25/2022: 4.2 cm   Umbilical hernia    Urinary tract infection due to ESBL Klebsiella    Vitamin D deficiency     Surgical History: Past Surgical History:  Procedure Laterality Date   ABDOMINAL HYSTERECTOMY     x2   BREAST BIOPSY Right 1990s   neg   BREAST BIOPSY Right 2000s   neg   BREAST BIOPSY Left 11/24/2020   u/s bx 10:30/8 cmfn-"heart" marker   BREAST EXCISIONAL BIOPSY Right    neg   BREAST LUMPECTOMY     BREAST LUMPECTOMY WITH RADIOFREQUENCY TAG IDENTIFICATION Left 12/10/2020   Procedure: BREAST LUMPECTOMY WITH RADIOFREQUENCY TAG IDENTIFICATION;  Surgeon: Campbell Lerner, MD;  Location: [facility] ORS;  Service: General;  Laterality: Left;   COLONOSCOPY     DILATION  AND CURETTAGE OF UTERUS     EYE SURGERY  2004   cataract surgery and laser surgery   Salpingo-oophorectomy        Family History: Family History  Problem Relation Age of Onset   Breast cancer Mother 59   Heart disease Father    Cancer Paternal Grandfather    Heart disease Paternal Grandfather     Social History:  reports that she has never smoked. She has never been exposed to tobacco smoke. She has never used smokeless tobacco. She reports that she does not currently use alcohol. She reports that she does not use drugs.  Physical Exam: BP (!) 161/86   Pulse 96   Temp (!) 97.1 F (36.2 C)   Resp 17   Ht 5\' 3"  (1.6 m)   Wt (!) 153.8 kg   SpO2 95%   BMI 60.05 kg/m    Constitutional:  Alert and oriented, No acute distress. Cardiovascular: Regular rate and rhythm Respiratory: Clear to auscultation bilaterally GI: Abdomen is soft, nontender, nondistended, no abdominal masses   Assessment & Plan:   72 year old extremely comorbid female with morbid obesity and BMI of 60, right 1 cm renal stone, recurrent infections first colonization, intermittent right-sided flank pain likely from obstruction.  She attempted dissolution therapy with potassium citrate to try to avoid surgery, but  has had only slight decrease in size of the stone and opted for ureteroscopy.  We specifically discussed the risks ureteroscopy including bleeding, infection/sepsis, stent related symptoms including flank pain/urgency/frequency/incontinence/dysuria, ureteral injury, inability to access stone, or need for staged or additional procedures.  We reviewed at length higher risk of postop UTI or even sepsis based on her recurrent infections and positive cultures, as well as numerous courses of antibiotics.  Right ureteroscopy, laser lithotripsy, stent placement today  Legrand Rams, MD 12/10/2022  [facility] [address] [place], [place] [address] [phone]

## 2022-12-10 NOTE — Anesthesia Procedure Notes (Signed)
Procedure Name: Intubation Date/Time: 12/10/2022 9:23 AM  Performed by: Morene Crocker, CRNAPre-anesthesia Checklist: Patient identified, Patient being monitored, Emergency Drugs available and Suction available Patient Re-evaluated:Patient Re-evaluated prior to induction Oxygen Delivery Method: Circle system utilized Preoxygenation: Pre-oxygenation with 100% oxygen Induction Type: IV induction and Rapid sequence Laryngoscope Size: 3 and McGraph Grade View: Grade I Tube type: Oral Tube size: 7.0 mm Number of attempts: 1 Airway Equipment and Method: Stylet and Video-laryngoscopy (ramp positioning completed using blankets and pillows under shoulders. head supported with pillow) Placement Confirmation: ETT inserted through vocal cords under direct vision, positive ETCO2, breath sounds checked- equal and bilateral and CO2 detector Secured at: 22 cm Tube secured with: Tape Dental Injury: Teeth and Oropharynx as per pre-operative assessment

## 2022-12-10 NOTE — Anesthesia Preprocedure Evaluation (Addendum)
Anesthesia Evaluation  Patient identified by MRN, date of birth, ID band Patient awake    Reviewed: Allergy & Precautions, NPO status , Patient's Chart, lab work & pertinent test results  History of Anesthesia Complications Negative for: history of anesthetic complications  Airway Mallampati: III   Neck ROM: Full    Dental no notable dental hx.    Pulmonary neg pulmonary ROS   Pulmonary exam normal breath sounds clear to auscultation       Cardiovascular hypertension, + Peripheral Vascular Disease (ascending TAA)  Normal cardiovascular exam Rhythm:Regular Rate:Normal  ECG 12/03/22: Normal sinus rhythm Right bundle branch block Left anterior fascicular block * Bifascicular block   Neuro/Psych negative neurological ROS     GI/Hepatic negative GI ROS,,,  Endo/Other  diabetes, Type 2  Class 3 obesity  Renal/GU      Musculoskeletal  (+) Arthritis ,    Abdominal   Peds  Hematology Breast CA   Anesthesia Other Findings Reviewed and agree with Edd Fabian pre-anesthesia clinical review note.   Reproductive/Obstetrics                             Anesthesia Physical Anesthesia Plan  ASA: 3  Anesthesia Plan: General   Post-op Pain Management:    Induction: Intravenous  PONV Risk Score and Plan: 3 and Ondansetron, Dexamethasone and Treatment may vary due to age or medical condition  Airway Management Planned: Oral ETT  Additional Equipment:   Intra-op Plan:   Post-operative Plan: Extubation in OR  Informed Consent: I have reviewed the patients History and Physical, chart, labs and discussed the procedure including the risks, benefits and alternatives for the proposed anesthesia with the patient or authorized representative who has indicated his/her understanding and acceptance.     Dental advisory given  Plan Discussed with: CRNA  Anesthesia Plan Comments: (Patient consented  for risks of anesthesia including but not limited to:  - adverse reactions to medications - damage to eyes, teeth, lips or other oral mucosa - nerve damage due to positioning  - sore throat or hoarseness - damage to heart, brain, nerves, lungs, other parts of body or loss of life  Informed patient about role of CRNA in peri- and intra-operative care.  Patient voiced understanding.)        Anesthesia Quick Evaluation

## 2022-12-10 NOTE — Transfer of Care (Signed)
Immediate Anesthesia Transfer of Care Note  Patient: Sharon Ware  Procedure(s) Performed: CYSTOSCOPY/URETEROSCOPY/HOLMIUM LASER/STENT PLACEMENT (Right: Ureter)  Patient Location: PACU  Anesthesia Type:General  Level of Consciousness: awake, alert , and oriented  Airway & Oxygen Therapy: Patient Spontanous Breathing and Patient connected to face mask oxygen  Post-op Assessment: Report given to RN and Post -op Vital signs reviewed and stable  Post vital signs: Reviewed and stable  Last Vitals:  Vitals Value Taken Time  BP 121/68 12/10/22 1018  Temp 36.7 C 12/10/22 1015  Pulse 78 12/10/22 1022  Resp 13 12/10/22 1022  SpO2 99 % 12/10/22 1022  Vitals shown include unfiled device data.  Last Pain:  Vitals:   12/10/22 1015  PainSc: Asleep         Complications: No notable events documented.

## 2022-12-10 NOTE — Anesthesia Postprocedure Evaluation (Signed)
Anesthesia Post Note  Patient: Sharon Ware  Procedure(s) Performed: CYSTOSCOPY/URETEROSCOPY/HOLMIUM LASER/STENT PLACEMENT (Right: Ureter)  Patient location during evaluation: PACU Anesthesia Type: General Level of consciousness: awake and alert, oriented and patient cooperative Pain management: pain level controlled Vital Signs Assessment: post-procedure vital signs reviewed and stable Respiratory status: spontaneous breathing, nonlabored ventilation and respiratory function stable Cardiovascular status: blood pressure returned to baseline and stable Postop Assessment: adequate PO intake Anesthetic complications: no   No notable events documented.   Last Vitals:  Vitals:   12/10/22 1045 12/10/22 1100  BP: (!) 112/51 (!) 111/49  Pulse: 73 70  Resp: 13 14  Temp: 36.5 C (!) 36.1 C  SpO2: 99% 93%    Last Pain:  Vitals:   12/10/22 1100  TempSrc: Temporal  PainSc: 0-No pain                 Reed Breech

## 2022-12-13 ENCOUNTER — Encounter: Payer: Self-pay | Admitting: Nurse Practitioner

## 2022-12-13 ENCOUNTER — Encounter: Payer: Medicare Other | Admitting: Occupational Therapy

## 2022-12-13 NOTE — Group Note (Deleted)

## 2022-12-15 ENCOUNTER — Ambulatory Visit: Payer: Medicare Other | Admitting: Occupational Therapy

## 2022-12-20 ENCOUNTER — Ambulatory Visit: Payer: Medicare Other | Admitting: Occupational Therapy

## 2022-12-20 ENCOUNTER — Encounter: Payer: Medicare Other | Admitting: Occupational Therapy

## 2022-12-20 DIAGNOSIS — I890 Lymphedema, not elsewhere classified: Secondary | ICD-10-CM

## 2022-12-20 NOTE — Therapy (Signed)
OUTPATIENT OCCUPATIONAL THERAPY TREATMENT NOTE   BILATERAL LOWER EXTREMITY LYMPHEDEMA  Patient Name: Sharon Ware MRN: [id number] DOB:July 22, 1950, 72 y.o., female Today's Date: 12/20/2022   END OF SESSION:   OT End of Session - 12/20/22 1411     Visit Number 17    Number of Visits 36    Date for OT Re-Evaluation 12/06/22    OT Start Time 0200    OT Stop Time 0305    OT Time Calculation (min) 65 min    Activity Tolerance Patient tolerated treatment well;No increased pain    Behavior During Therapy WFL for tasks assessed/performed               Past Medical History:  Diagnosis Date   Aortic atherosclerosis (HCC)    Arthritis of both knees    Breast cancer (HCC)    Cataract    Genitourinary syndrome of menopause    Hiatal hernia    History of kidney stones    Hyperlipidemia    Hypertension    Long term current use of aromatase inhibitor    a.) letrozole   Long term current use of aspirin    Lymphedema    Malignant neoplasm of upper-inner quadrant of left breast in female, estrogen receptor positive (HCC)    Morbid obesity with BMI of 60.0-69.9, adult (HCC)    Pneumonia    T2DM (type 2 diabetes mellitus) (HCC)    Thoracic ascending aortic aneurysm (HCC) 09/25/2022   a.) CT chest 09/25/2022: 4.2 cm   Umbilical hernia    Urinary tract infection due to ESBL Klebsiella    Vitamin D deficiency    Past Surgical History:  Procedure Laterality Date   ABDOMINAL HYSTERECTOMY     x2   BREAST BIOPSY Right 1990s   neg   BREAST BIOPSY Right 2000s   neg   BREAST BIOPSY Left 11/24/2020   u/s bx 10:30/8 cmfn-"heart" marker   BREAST EXCISIONAL BIOPSY Right    neg   BREAST LUMPECTOMY     BREAST LUMPECTOMY WITH RADIOFREQUENCY TAG IDENTIFICATION Left 12/10/2020   Procedure: BREAST LUMPECTOMY WITH RADIOFREQUENCY TAG IDENTIFICATION;  Surgeon: Campbell Lerner, MD;  Location: [facility] ORS;  Service: General;  Laterality: Left;   COLONOSCOPY     CYSTOSCOPY/URETEROSCOPY/HOLMIUM  LASER/STENT PLACEMENT Right 12/10/2022   Procedure: CYSTOSCOPY/URETEROSCOPY/HOLMIUM LASER/STENT PLACEMENT;  Surgeon: Sondra Come, MD;  Location: [facility] ORS;  Service: Urology;  Laterality: Right;   DILATION AND CURETTAGE OF UTERUS     EYE SURGERY  2004   cataract surgery and laser surgery   Salpingo-oophorectomy     Patient Active Problem List   Diagnosis Date Noted   Kidney stones 12/08/2022   Lymphedema 09/25/2022   Hypoxia 09/25/2022   Edema 01/30/2021   Malignant neoplasm of upper-inner quadrant of left breast in female, estrogen receptor positive (HCC) 12/01/2020   Goals of care, counseling/discussion 12/01/2020   Morbid obesity (HCC) 08/08/2018   BMI 60.0-69.9, adult (HCC) 08/08/2018   Controlled diabetes mellitus type 2 with complications (HCC) 08/08/2018   Hyperlipidemia 08/08/2018   Hypertension associated with diabetes (HCC) 08/08/2018   Arthritis of both knees 08/08/2018   Vitamin D deficiency 08/08/2018   PCP: Olevia Perches, DO  REFERRING PROVIDER: Gwynn Burly, DO  REFERRING DIAG: I89.0  THERAPY DIAG:  Lymphedema, not elsewhere classified [I89.0]  Rationale for Evaluation and Treatment: Rehabilitation  ONSET DATE: 1 yr ago without known precipitating event- by report  SUBJECTIVE:  SUBJECTIVE STATEMENT: Sharon Ware presents for OT treatment to address BLE lymphedema, L>R. Pt is accompanied by her spouse, [name], who assists her with transfers and with compression bandaging. Pt denies LE-related leg pain. She arrives seated in a transport wheelchair s/p surgical removal of a kidney stone. Pt denies LE related pain today.  Tobacco Use: Low Risk  (12/10/2022)   Patient History    Smoking Tobacco Use: Never    Smokeless Tobacco Use: Never    Passive Exposure: Never   PERTINENT  HISTORY: HTN, Obesity w BMI 60-69.9, DM, B knee arthritis, Hx L breast cancer, recurrent L leg cellulitis. MRSA test results pending  PAIN: Are you having pain? Denies LE related pain. Endorses OA knee pain and kidney stone -related pain  PRECAUTIONS: Fall and Other: lymphedema precautions, DM. Hx L Br Ca  PRIOR LEVEL OF FUNCTION: Independent with household mobility with device, Requires assistive device for independence, Needs assistance with homemaking, Needs assistance with gait, and Needs assistance with transfers  PATIENT GOALS: ... Get improvement for my condition...., keep lymphedema from getting worse, and reduce infection risk"  LYMPHEDEMA ASSESSMENTS:  FOTO Functional Outcomes Measure: Initial 33%  Lymphedema Life Impact Scale (LLIS): 22.06%  BLE COMPARATIVE LIMB VOLUMETRICS Initial 09/14/22  LANDMARK RIGHT    R LEG (A-D) 7717.1 ml  R THIGH (E-G) ml  R FULL LIMB (A-G) ml  Limb Volume differential (LVD)  %  Volume change since initial %  Volume change overall V  (Blank rows = not tested)  LANDMARK LEFT   L LEG (A-D) 7752.1 ml  L THIGH (E-G) ml  L FULL LIMB (A-G) ml  Limb Volume differential (LVD)  Leg LVD measures 0.5%, L>R   Volume change since initial %  Volume change overall %   (Blank rows = not tested)    BLE COMPARATIVE LIMB VOLUMETRICS 9th visit 11/05/22  LANDMARK LEFT   L LEG (A-D) 5445.7 ml  L THIGH (E-G) ml  L FULL LIMB (A-G) ml  Limb Volume differential (LVD)    Volume change since initial  L LEG reduced in volume by 29.43% since 09/14/22  Volume change overall %  (Blank rows = not tested)   LLE COMPARATIVE LIMB VOLUMETRICS 16 th visit 12/08/22  LANDMARK LEFT   L LEG (A-D) 5445.7 ml  L THIGH (E-G) ml  L FULL LIMB (A-G) ml  Limb Volume differential (LVD)    Volume change since initial  L LEG is INCREASED in volume by 2.36% today, which is essentially stable since last measured on 11/05/22  Volume change overall  L LEG reduced in volume by 29.43%  since 09/14/22  (Blank rows = not tested)   TODAY'S TREATMENT:                                                                                                                                          Knee length  RLE multilayer compression wraps- as established MLD as established to RLE  PATIENT EDUCATION:  Continued Pt/ CG edu for lymphedema self care home program throughout session. Topics include outcome of comparative limb volumetrics- starting limb volume differentials (LVDs), technology and gradient techniques used for short stretch, multilayer compression wrapping, simple self-MLD, therapeutic lymphatic pumping exercises, skin/nail care, LE precautions,. compression garment recommendations and specifications, wear and care schedule and compression garment donning / doffing w assistive devices. Discussed progress towards all OT goals since commencing CDT. All questions answered to the Pt's satisfaction. Good return. Person educated: Patient and Spouse Education method: Explanation, Demonstration, and Handouts Education comprehension: verbalized understanding, returned demonstration, verbal cues required, and needs further education  Lymphedema HOME PROGRAM:Complete Decongestive Therapy (CDT) Self-Management Phase Daily simple self-MLD Daily BLE skin care and inspection Daily lymphatic pumping there ex Appropriate daily compression- knee length, will require max caregiver A to  and doff Leg elevation when seated  ASSESSMENT: CLINICAL IMPRESSION:   LLE custom compression garment is containing limb edema well. Continued MLD with simultaneous skin care to RLE today without increased PAIN. issued NEW COMPRESSION WRAPS AND APPLIED GRADIENT WRAP TO RLE AS ESTABLISHED BELOW THE KNEE. cont AS PER POC. Cont as per POC.  OBJECTIVE IMPAIRMENTS: Abnormal gait, decreased activity tolerance, decreased balance, decreased endurance, decreased knowledge of condition, decreased knowledge of use of DME,  decreased mobility, difficulty walking, decreased ROM, decreased strength, increased edema, impaired perceived functional ability, postural dysfunction, obesity, pain, and chronic progressive limb swelling with elevated infection risk and risk of non-healing wounds .   ACTIVITY LIMITATIONS: carrying, lifting, bending, sitting, standing, squatting, sleeping, stairs, transfers, bed mobility, bathing, toileting, dressing, hygiene/grooming, and caring for others  PARTICIPATION LIMITATIONS: meal prep, cleaning, laundry, driving, shopping, community activity, occupation, yard work, and church  PERSONAL FACTORS: 3+ comorbidities: HTN, Stage III Obesity, B knee arthritis pain and inflammation and impaired AROM 2/2 body habitus  are also affecting patient's functional outcome.   REHAB POTENTIAL:  Fair with daily CG assistance throughout Intensive Phase CDT.  Poor without daily CG assistance with all LE self care home program components  GOALS: Goals reviewed with patient? Yes  SHORT TERM GOALS: Target date: 4th OT Rx visit   Pt will demonstrate understanding of lymphedema precautions and prevention strategies with modified independence using a printed reference to identify at least 5 precautions and discussing how s/he may implement them into daily life to reduce risk of progression with modified assistance ( printed reference). Baseline: modified independence ( printed reference) Goal status: 11/05/22 GOAL MET  2.  Pt will be able to apply multilayer, thigh length, compression wraps using gradient techniques with Max caregiver assistance to decrease limb volume, to limit infection risk, and to limit lymphedema progression.  Baseline: Max A Goal status: 11/05/22 GOAL MET  LONG TERM GOALS: Target date: 12/06/22  Given this patient's Intake score of 22.06 % on the Lymphedema Life Impact Scale (LLIS), patient will experience a reduction of at least 5 points in her perceived level of functional impairment  resulting from lymphedema to improve functional performance and quality of life (QOL). Baseline: 22.06 % Goal status ONGOING-PROGRESSING  2.  Given this patient's Intake score of 33 % on the functional outcomes FOTO tool, patient will experience an increase in function of 3 points to improve basic and instrumental ADLs performance, including lymphedema self-care.  Baseline: 33 % Goal status: ONGOING-PROGRESSING  3.  During Intensive phase CDT Pt will achieve at least 85% compliance with all lymphedema self-care  home program components, including  daily skin care, multilayer , gradient compression wraps with daily changes, daily simple self MLD and daily lymphatic pumping therex to achieve optimal clinical outcome and to habituate self care regime for optimal LE self-management over time. Baseline: Dependent Goal status:11/05/22 GOAL MET  4.  Pt will achieve at least a 10% volume reductions bilaterally below the knees to return limb to more typical size and shape, to limit infection risk and LE progression, to decrease pain, to improve function, and to improve body image and QOL. Baseline: Dependent Goal status:11/05/22 GOAL for L LEG w 29% reduction to date.  5.  Pt will be able to don and doff appropriate compression garments and devices using assistive devices and extra time and Max CG assistance within 1 week of issue date for optimal lymphedema self-care. Baseline: Dependent Goal status: ONGOING PLAN:  PT FREQUENCY: 2 x/week  PT DURATION: 12  weeks other: and PRN  PLANNED INTERVENTIONS: Therapeutic exercises, Therapeutic activity, Patient/Family education, Self Care, DME instructions, Manual lymph drainage, Compression bandaging, Manual therapy, and skin care during MLD, fit with appropriate custom compression garments and devices, fit with appropriate compression device  which  follows lymphatic pathways and anatomic distribution  PLAN FOR NEXT SESSION:  Finish assessing LLE garment fit  and function Commence RLE/RLQ CDT including  RLE multilayer compression bandages RLE MLD w simultaneous skin care  Loel Dubonnet, MS, OTR/L, CLT-LANA 12/20/22 4:06 PM

## 2022-12-21 ENCOUNTER — Ambulatory Visit: Payer: Medicare Other | Admitting: Occupational Therapy

## 2022-12-22 ENCOUNTER — Encounter: Payer: Medicare Other | Admitting: Occupational Therapy

## 2022-12-23 ENCOUNTER — Ambulatory Visit: Payer: Medicare Other | Admitting: Occupational Therapy

## 2022-12-23 DIAGNOSIS — I890 Lymphedema, not elsewhere classified: Secondary | ICD-10-CM

## 2022-12-24 ENCOUNTER — Encounter: Payer: Self-pay | Admitting: Physician Assistant

## 2022-12-24 ENCOUNTER — Ambulatory Visit (INDEPENDENT_AMBULATORY_CARE_PROVIDER_SITE_OTHER): Payer: Medicare Other | Admitting: Physician Assistant

## 2022-12-24 VITALS — BP 130/84 | HR 92 | Ht 63.0 in | Wt 339.0 lb

## 2022-12-24 DIAGNOSIS — Z87440 Personal history of urinary (tract) infections: Secondary | ICD-10-CM

## 2022-12-24 DIAGNOSIS — N390 Urinary tract infection, site not specified: Secondary | ICD-10-CM

## 2022-12-24 DIAGNOSIS — R300 Dysuria: Secondary | ICD-10-CM | POA: Diagnosis not present

## 2022-12-24 LAB — URINALYSIS, COMPLETE
Bilirubin, UA: NEGATIVE
Glucose, UA: NEGATIVE
Nitrite, UA: NEGATIVE
Specific Gravity, UA: 1.025 (ref 1.005–1.030)
Urobilinogen, Ur: 0.2 mg/dL (ref 0.2–1.0)
pH, UA: 5.5 (ref 5.0–7.5)

## 2022-12-24 LAB — MICROSCOPIC EXAMINATION: WBC, UA: >30 /hpf — AB (ref 0–5)

## 2022-12-24 MED ORDER — METHENAMINE HIPPURATE 1 G PO TABS
1.0000 g | ORAL_TABLET | Freq: Two times a day (BID) | ORAL | 11 refills | Status: DC
Start: 2022-12-24 — End: 2023-05-04

## 2022-12-24 MED ORDER — AMOXICILLIN-POT CLAVULANATE 875-125 MG PO TABS
1.0000 | ORAL_TABLET | Freq: Two times a day (BID) | ORAL | 0 refills | Status: AC
Start: 2022-12-24 — End: 2022-12-31

## 2022-12-24 NOTE — Therapy (Signed)
OUTPATIENT OCCUPATIONAL THERAPY TREATMENT NOTE   BILATERAL LOWER EXTREMITY LYMPHEDEMA  Patient Name: Sharon Ware MRN: [id number] DOB:10-Jun-1950, 72 y.o., female Today's Date: 12/24/2022   END OF SESSION:   OT End of Session - 12/23/22 0826     Visit Number 18    Number of Visits 36    Date for OT Re-Evaluation 12/06/22    OT Start Time 0206    OT Stop Time 0306    OT Time Calculation (min) 60 min    Activity Tolerance Patient tolerated treatment well;No increased pain    Behavior During Therapy WFL for tasks assessed/performed               Past Medical History:  Diagnosis Date   Aortic atherosclerosis (HCC)    Arthritis of both knees    Breast cancer (HCC)    Cataract    Genitourinary syndrome of menopause    Hiatal hernia    History of kidney stones    Hyperlipidemia    Hypertension    Long term current use of aromatase inhibitor    a.) letrozole   Long term current use of aspirin    Lymphedema    Malignant neoplasm of upper-inner quadrant of left breast in female, estrogen receptor positive (HCC)    Morbid obesity with BMI of 60.0-69.9, adult (HCC)    Pneumonia    T2DM (type 2 diabetes mellitus) (HCC)    Thoracic ascending aortic aneurysm (HCC) 09/25/2022   a.) CT chest 09/25/2022: 4.2 cm   Umbilical hernia    Urinary tract infection due to ESBL Klebsiella    Vitamin D deficiency    Past Surgical History:  Procedure Laterality Date   ABDOMINAL HYSTERECTOMY     x2   BREAST BIOPSY Right 1990s   neg   BREAST BIOPSY Right 2000s   neg   BREAST BIOPSY Left 11/24/2020   u/s bx 10:30/8 cmfn-"heart" marker   BREAST EXCISIONAL BIOPSY Right    neg   BREAST LUMPECTOMY     BREAST LUMPECTOMY WITH RADIOFREQUENCY TAG IDENTIFICATION Left 12/10/2020   Procedure: BREAST LUMPECTOMY WITH RADIOFREQUENCY TAG IDENTIFICATION;  Surgeon: Campbell Lerner, MD;  Location: [facility];  Service: General;  Laterality: Left;   COLONOSCOPY     CYSTOSCOPY/URETEROSCOPY/HOLMIUM  LASER/STENT PLACEMENT Right 12/10/2022   Procedure: CYSTOSCOPY/URETEROSCOPY/HOLMIUM LASER/STENT PLACEMENT;  Surgeon: Sondra Come, MD;  Location: [facility];  Service: Urology;  Laterality: Right;   DILATION AND CURETTAGE OF UTERUS     EYE SURGERY  2004   cataract surgery and laser surgery   Salpingo-oophorectomy     Patient Active Problem List   Diagnosis Date Noted   Kidney stones 12/08/2022   Lymphedema 09/25/2022   Hypoxia 09/25/2022   Edema 01/30/2021   Malignant neoplasm of upper-inner quadrant of left breast in female, estrogen receptor positive (HCC) 12/01/2020   Goals of care, counseling/discussion 12/01/2020   Morbid obesity (HCC) 08/08/2018   BMI 60.0-69.9, adult (HCC) 08/08/2018   Controlled diabetes mellitus type 2 with complications (HCC) 08/08/2018   Hyperlipidemia 08/08/2018   Hypertension associated with diabetes (HCC) 08/08/2018   Arthritis of both knees 08/08/2018   Vitamin D deficiency 08/08/2018   PCP: Olevia Perches, DO  REFERRING PROVIDER: Gwynn Burly, DO  REFERRING DIAG: I89.0  THERAPY DIAG:  Lymphedema, not elsewhere classified [I89.0]  Rationale for Evaluation and Treatment: Rehabilitation  ONSET DATE: 1 yr ago without known precipitating event- by report  SUBJECTIVE:  SUBJECTIVE STATEMENT: Sharon Ware presents for OT treatment to address BLE lymphedema, L>R. Pt is accompanied by her spouse, Sharon Ware, who assists her with transfers and with compression bandaging. Pt denies LE-related leg pain. She arrives seated in a transport wheelchair s/p surgical removal of a kidney stone. Pt denies LE related pain today.  Tobacco Use: Low Risk  (12/10/2022)   Patient History    Smoking Tobacco Use: Never    Smokeless Tobacco Use: Never    Passive Exposure: Never   PERTINENT  HISTORY: HTN, Obesity w BMI 60-69.9, DM, B knee arthritis, Hx L breast cancer, recurrent L leg cellulitis. MRSA test results pending  PAIN: Are you having pain? Denies LE related pain. Endorses OA knee pain and kidney stone -related pain  PRECAUTIONS: Fall and Other: lymphedema precautions, DM. Hx L Br Ca  PRIOR LEVEL OF FUNCTION: Independent with household mobility with device, Requires assistive device for independence, Needs assistance with homemaking, Needs assistance with gait, and Needs assistance with transfers  PATIENT GOALS: ... Get improvement for my condition...., keep lymphedema from getting worse, and reduce infection risk"  LYMPHEDEMA ASSESSMENTS:  FOTO Functional Outcomes Measure: Initial 33%  Lymphedema Life Impact Scale (LLIS): 22.06%  BLE COMPARATIVE LIMB VOLUMETRICS Initial 09/14/22  LANDMARK RIGHT    R LEG (A-D) 7717.1 ml  R THIGH (E-G) ml  R FULL LIMB (A-G) ml  Limb Volume differential (LVD)  %  Volume change since initial %  Volume change overall V  (Blank rows = not tested)  LANDMARK LEFT   L LEG (A-D) 7752.1 ml  L THIGH (E-G) ml  L FULL LIMB (A-G) ml  Limb Volume differential (LVD)  Leg LVD measures 0.5%, L>R   Volume change since initial %  Volume change overall %   (Blank rows = not tested)    BLE COMPARATIVE LIMB VOLUMETRICS 9th visit 11/05/22  LANDMARK LEFT   L LEG (A-D) 5445.7 ml  L THIGH (E-G) ml  L FULL LIMB (A-G) ml  Limb Volume differential (LVD)    Volume change since initial  L LEG reduced in volume by 29.43% since 09/14/22  Volume change overall %  (Blank rows = not tested)   LLE COMPARATIVE LIMB VOLUMETRICS 16 th visit 12/08/22  LANDMARK LEFT   L LEG (A-D) 5445.7 ml  L THIGH (E-G) ml  L FULL LIMB (A-G) ml  Limb Volume differential (LVD)    Volume change since initial  L LEG is INCREASED in volume by 2.36% today, which is essentially stable since last measured on 11/05/22  Volume change overall  L LEG reduced in volume by 29.43%  since 09/14/22  (Blank rows = not tested)   TODAY'S TREATMENT:                                                                                                                                         Custom LLE compression  garment/ device fitting Knee length RLE multilayer compression wraps- as established MLD as established to RLE- simultaneous skin care  PATIENT EDUCATION:  Continued Pt/ CG edu for lymphedema self care home program throughout session. Topics include outcome of comparative limb volumetrics- starting limb volume differentials (LVDs), technology and gradient techniques used for short stretch, multilayer compression wrapping, simple self-MLD, therapeutic lymphatic pumping exercises, skin/nail care, LE precautions,. compression garment recommendations and specifications, wear and care schedule and compression garment donning / doffing w assistive devices. Discussed progress towards all OT goals since commencing CDT. All questions answered to the Pt's satisfaction. Good return. Person educated: Patient and Spouse Education method: Explanation, Demonstration, and Handouts Education comprehension: verbalized understanding, returned demonstration, verbal cues required, and needs further education  Lymphedema HOME PROGRAM:Complete Decongestive Therapy (CDT) Self-Management Phase Daily simple self-MLD Daily BLE skin care and inspection Daily lymphatic pumping there ex Appropriate daily compression- knee length, will require max caregiver A to  and doff Leg elevation when seated  ASSESSMENT: CLINICAL IMPRESSION:   Completed initial fitting of LLE RELAX HOS device since incorrect garment was sent by manufacturer 2 weeks, or so, ago. Device feels slightly too large upon initial assessment by light amount of resistance noted when donning. Device is also slightly too long. Pt agrees to wear nightly and wash before next OT visit to assess functionality of day to day use. We'll order a remake  PRN. Remainder of LLE custom compression garment also issued. No fitting needed as these are made from initial garment measurements. Spouse is able to don and doff  device after skilled teaching. Continued MLD with simultaneous skin care to RLE today without increased PAIN. issued NEW COMPRESSION WRAPS AND APPLIED GRADIENT WRAP TO RLE AS ESTABLISHED BELOW THE KNEE.  Cont as per POC.  OBJECTIVE IMPAIRMENTS: Abnormal gait, decreased activity tolerance, decreased balance, decreased endurance, decreased knowledge of condition, decreased knowledge of use of DME, decreased mobility, difficulty walking, decreased ROM, decreased strength, increased edema, impaired perceived functional ability, postural dysfunction, obesity, pain, and chronic progressive limb swelling with elevated infection risk and risk of non-healing wounds .   ACTIVITY LIMITATIONS: carrying, lifting, bending, sitting, standing, squatting, sleeping, stairs, transfers, bed mobility, bathing, toileting, dressing, hygiene/grooming, and caring for others  PARTICIPATION LIMITATIONS: meal prep, cleaning, laundry, driving, shopping, community activity, occupation, yard work, and church  PERSONAL FACTORS: 3+ comorbidities: HTN, Stage III Obesity, B knee arthritis pain and inflammation and impaired AROM 2/2 body habitus  are also affecting patient's functional outcome.   REHAB POTENTIAL:  Fair with daily CG assistance throughout Intensive Phase CDT.  Poor without daily CG assistance with all LE self care home program components  GOALS: Goals reviewed with patient? Yes  SHORT TERM GOALS: Target date: 4th OT Rx visit   Pt will demonstrate understanding of lymphedema precautions and prevention strategies with modified independence using a printed reference to identify at least 5 precautions and discussing how s/he may implement them into daily life to reduce risk of progression with modified assistance ( printed reference). Baseline: modified  independence ( printed reference) Goal status: 11/05/22 GOAL MET  2.  Pt will be able to apply multilayer, thigh length, compression wraps using gradient techniques with Max caregiver assistance to decrease limb volume, to limit infection risk, and to limit lymphedema progression.  Baseline: Max A Goal status: 11/05/22 GOAL MET  LONG TERM GOALS: Target date: 12/06/22  Given this patient's Intake score of 22.06 % on the Lymphedema Life Impact Scale (LLIS), patient will experience a  reduction of at least 5 points in her perceived level of functional impairment resulting from lymphedema to improve functional performance and quality of life (QOL). Baseline: 22.06 % Goal status ONGOING-PROGRESSING  2.  Given this patient's Intake score of 33 % on the functional outcomes FOTO tool, patient will experience an increase in function of 3 points to improve basic and instrumental ADLs performance, including lymphedema self-care.  Baseline: 33 % Goal status: ONGOING-PROGRESSING  3.  During Intensive phase CDT Pt will achieve at least 85% compliance with all lymphedema self-care home program components, including  daily skin care, multilayer , gradient compression wraps with daily changes, daily simple self MLD and daily lymphatic pumping therex to achieve optimal clinical outcome and to habituate self care regime for optimal LE self-management over time. Baseline: Dependent Goal status:11/05/22 GOAL MET  4.  Pt will achieve at least a 10% volume reductions bilaterally below the knees to return limb to more typical size and shape, to limit infection risk and LE progression, to decrease pain, to improve function, and to improve body image and QOL. Baseline: Dependent Goal status:11/05/22 GOAL for L LEG w 29% reduction to date.  5.  Pt will be able to don and doff appropriate compression garments and devices using assistive devices and extra time and Max CG assistance within 1 week of issue date for optimal  lymphedema self-care. Baseline: Dependent Goal status: ONGOING PLAN:  PT FREQUENCY: 2 x/week  PT DURATION: 12  weeks other: and PRN  PLANNED INTERVENTIONS: Therapeutic exercises, Therapeutic activity, Patient/Family education, Self Care, DME instructions, Manual lymph drainage, Compression bandaging, Manual therapy, and skin care during MLD, fit with appropriate custom compression garments and devices, fit with appropriate compression device  which  follows lymphatic pathways and anatomic distribution  PLAN FOR NEXT SESSION:  Finish assessing LLE garment fit and function Commence RLE/RLQ CDT including  RLE multilayer compression bandages RLE MLD w simultaneous skin care  Loel Dubonnet, MS, OTR/L, CLT-LANA 12/24/22 8:27 AM

## 2022-12-24 NOTE — Progress Notes (Signed)
12/24/2022 11:43 AM   Sharon Ware September 11, 1950 [id number]  CC: Chief Complaint  Patient presents with   Follow-up   Urinary Tract Infection   HPI: Sharon Ware is a 72 y.o. female with PMH diabetes days, BMI 63, lymphedema, nephrolithiasis who underwent right ureteroscopy with laser lithotripsy and stent placement with Dr. Richardo Hanks 14 days ago, and occasional dysuria/urgency/frequency consistent with recurrent UTI versus GSM versus OAB who presents today for evaluation of possible UTI.   Today she reports she completed 6 days of postop Augmentin and 3 days later her dysuria returned.  She removed her stent at home as instructed and tolerated that well.  She describes burning before and during urination that resolves afterward.  Per chart review, her recent urine cultures have grown MDR organisms.  Dr. Richardo Hanks gave her Gemtesa samples in clinic a month ago, but she has not started these yet.  She was concerned they could cause her some diarrhea.  She has noticed that her urgency and frequency significantly improved after her ureteroscopy.  She think she wants to defer starting the Gemtesa samples.  In-office catheterized UA today positive for trace ketones, 2+ blood, 2+ protein, and 1+ leukocytes; urine microscopy with >30 WBCs/HPF, 3-10 RBCs/HPF, and moderate bacteria.  PMH: Past Medical History:  Diagnosis Date   Aortic atherosclerosis (HCC)    Arthritis of both knees    Breast cancer (HCC)    Cataract    Genitourinary syndrome of menopause    Hiatal hernia    History of kidney stones    Hyperlipidemia    Hypertension    Long term current use of aromatase inhibitor    a.) letrozole   Long term current use of aspirin    Lymphedema    Malignant neoplasm of upper-inner quadrant of left breast in female, estrogen receptor positive (HCC)    Morbid obesity with BMI of 60.0-69.9, adult (HCC)    Pneumonia    T2DM (type 2 diabetes mellitus) (HCC)    Thoracic ascending  aortic aneurysm (HCC) 09/25/2022   a.) CT chest 09/25/2022: 4.2 cm   Umbilical hernia    Urinary tract infection due to ESBL Klebsiella    Vitamin D deficiency     Surgical History: Past Surgical History:  Procedure Laterality Date   ABDOMINAL HYSTERECTOMY     x2   BREAST BIOPSY Right 1990s   neg   BREAST BIOPSY Right 2000s   neg   BREAST BIOPSY Left 11/24/2020   u/s bx 10:30/8 cmfn-"heart" marker   BREAST EXCISIONAL BIOPSY Right    neg   BREAST LUMPECTOMY     BREAST LUMPECTOMY WITH RADIOFREQUENCY TAG IDENTIFICATION Left 12/10/2020   Procedure: BREAST LUMPECTOMY WITH RADIOFREQUENCY TAG IDENTIFICATION;  Surgeon: Campbell Lerner, MD;  Location: [facility];  Service: General;  Laterality: Left;   COLONOSCOPY     CYSTOSCOPY/URETEROSCOPY/HOLMIUM LASER/STENT PLACEMENT Right 12/10/2022   Procedure: CYSTOSCOPY/URETEROSCOPY/HOLMIUM LASER/STENT PLACEMENT;  Surgeon: Sondra Come, MD;  Location: [facility];  Service: Urology;  Laterality: Right;   DILATION AND CURETTAGE OF UTERUS     EYE SURGERY  2004   cataract surgery and laser surgery   Salpingo-oophorectomy      Home Medications:  Allergies as of 12/24/2022       Reactions   Other Anaphylaxis   Honey Dew Melon   Wound Dressing Adhesive Rash   Ciprofloxacin    Back spasms   Morphine And Codeine Other (See Comments)   Hypotension   Latex Rash  Medication List        Accurate as of December 24, 2022 11:43 AM. If you have any questions, ask your nurse or doctor.          acetaminophen 650 MG CR tablet Commonly known as: TYLENOL Take 650 mg by mouth every 8 (eight) hours as needed for pain.   amoxicillin-clavulanate 875-125 MG tablet Commonly known as: AUGMENTIN Take 1 tablet by mouth 2 (two) times daily.   aspirin 81 MG tablet Take 81 mg by mouth daily.   CALCIUM CITRATE + D3 PO Take 1,200 mg by mouth daily.   Fish Oil Ultra 1400 MG Caps Take 1,400 mg by mouth daily.   ICY HOT EX Apply 1  application topically daily as needed (pain).   letrozole 2.5 MG tablet Commonly known as: FEMARA Take 1 tablet (2.5 mg total) by mouth daily.   losartan-hydrochlorothiazide 50-12.5 MG tablet Commonly known as: HYZAAR Take 1 tablet by mouth daily.   lovastatin 20 MG tablet Commonly known as: MEVACOR TAKE 1 TABLET BY MOUTH EVERYDAY AT BEDTIME   metFORMIN 500 MG 24 hr tablet Commonly known as: GLUCOPHAGE-XR Take 2 tablets (1,000 mg total) by mouth 2 (two) times daily with a meal.   naloxone 4 MG/0.1ML Liqd nasal spray kit Commonly known as: NARCAN CALL 911. SPR CONTENTS OF ONE SPRAYER (0.1ML) INTO ONE NOSTRIL. REPEAT IN 2-3 MIN IF SYMPTOMS OF OPIOID EMERGENCY PERSIST, ALTERNATE NOSTRILS   Rybelsus 7 MG Tabs Generic drug: Semaglutide Take 1 tablet (7 mg total) by mouth daily.   spironolactone 50 MG tablet Commonly known as: ALDACTONE Take 1 tablet (50 mg total) by mouth daily.   UNABLE TO FIND 3 (three) times daily. Probiotic Vaginal Pearls   Vitamin D3 125 MCG (5000 UT) Tabs Take 5,000 Units by mouth daily.        Allergies:  Allergies  Allergen Reactions   Other Anaphylaxis    Honey Dew Melon   Wound Dressing Adhesive Rash   Ciprofloxacin     Back spasms   Morphine And Codeine Other (See Comments)    Hypotension   Latex Rash    Family History: Family History  Problem Relation Age of Onset   Breast cancer Mother 75   Heart disease Father    Cancer Paternal Grandfather    Heart disease Paternal Grandfather     Social History:   reports that she has never smoked. She has never been exposed to tobacco smoke. She has never used smokeless tobacco. She reports that she does not currently use alcohol. She reports that she does not use drugs.  Physical Exam: BP 130/84   Pulse 92   Ht 5\' 3"  (1.6 m)   Wt (!) 339 lb (153.8 kg)   BMI 60.05 kg/m   Constitutional:  Alert and oriented, no acute distress, nontoxic appearing HEENT: Sharon Ware, AT Cardiovascular: No  clubbing, cyanosis, or edema Respiratory: Normal respiratory effort, no increased work of breathing Skin: No rashes, bruises or suspicious lesions Neurologic: Grossly intact, no focal deficits, moving all 4 extremities Psychiatric: Normal mood and affect  Laboratory Data: Results for orders placed or performed in visit on 12/24/22  Microscopic Examination   Urine  Result Value Ref Range   WBC, UA >30 (A) 0 - 5 /hpf   RBC, Urine 3-10 (A) 0 - 2 /hpf   Epithelial Cells (non renal) 0-10 0 - 10 /hpf   Bacteria, UA Moderate (A) None seen/Few  Urinalysis, Complete  Result Value Ref Range  Specific Gravity, UA 1.025 1.005 - 1.030   pH, UA 5.5 5.0 - 7.5   Color, UA Yellow Yellow   Appearance Ur Cloudy (A) Clear   Leukocytes,UA 1+ (A) Negative   Protein,UA 2+ (A) Negative/Trace   Glucose, UA Negative Negative   Ketones, UA Trace (A) Negative   RBC, UA 2+ (A) Negative   Bilirubin, UA Negative Negative   Urobilinogen, Ur 0.2 0.2 - 1.0 mg/dL   Nitrite, UA Negative Negative   Microscopic Examination See below:    In and Out Catheterization  Patient is present today for a I & O catheterization due to recurrent UTI. Patient was cleaned and prepped in a sterile fashion with betadine . A 14FR cath was inserted no complications were noted , 60ml of urine return was noted, urine was yellow in color. A clean urine sample was collected for UA, culture. Bladder was drained and catheter was removed without difficulty.    Performed by: Mervin Hack, CMA, and Carman Ching, PA-C   Assessment & Plan:   1. Recurrent UTI Cath UA appears grossly infected today with reports of recurrent dysuria.  I offered her fosfomycin, but she states she has had this in the past and it did not help.  She prefers to resume Augmentin.  We discussed the risk of resistance given recent use and she understands this.  Given that she has recently grown MDR organisms, I recommended starting Hiprex for UTI prevention  as her UTIs are going to become increasingly challenging to treat, though admittedly we have not ruled out GSM versus OAB as contributors to her symptoms.    With reports of improved urgency/frequency since undergoing ureteroscopy, okay to defer initiating Gemtesa for now.  Will have her follow-up with Dr. Richardo Hanks as scheduled. - Urinalysis, Complete - CULTURE, URINE COMPREHENSIVE - methenamine (HIPREX) 1 g tablet; Take 1 tablet (1 g total) by mouth 2 (two) times daily with a meal.  Dispense: 60 tablet; Refill: 11 - amoxicillin-clavulanate (AUGMENTIN) 875-125 MG tablet; Take 1 tablet by mouth 2 (two) times daily for 7 days.  Dispense: 14 tablet; Refill: 0   Return if symptoms worsen or fail to improve.  Carman Ching, PA-C  [facility]  [address], [address] [place], [place] [address] [phone]

## 2022-12-27 ENCOUNTER — Ambulatory Visit: Payer: Medicare Other | Admitting: Occupational Therapy

## 2022-12-27 DIAGNOSIS — I890 Lymphedema, not elsewhere classified: Secondary | ICD-10-CM | POA: Diagnosis not present

## 2022-12-27 NOTE — Therapy (Unsigned)
OUTPATIENT OCCUPATIONAL THERAPY TREATMENT NOTE   BILATERAL LOWER EXTREMITY LYMPHEDEMA  Patient Name: CARRINA ZINMAN MRN: [id number] DOB:[birth year], 72 y.o., female Today's Date: 12/28/2022   END OF SESSION:   OT End of Session - 12/27/22 1316     Visit Number 19    Number of Visits 36    Date for OT Re-Evaluation 03/24/23    OT Start Time 0105    OT Stop Time 0205    OT Time Calculation (min) 60 min    Activity Tolerance Patient tolerated treatment well;No increased pain    Behavior During Therapy WFL for tasks assessed/performed               Past Medical History:  Diagnosis Date   Aortic atherosclerosis (HCC)    Arthritis of both knees    Breast cancer (HCC)    Cataract    Genitourinary syndrome of menopause    Hiatal hernia    History of kidney stones    Hyperlipidemia    Hypertension    Long term current use of aromatase inhibitor    a.) letrozole   Long term current use of aspirin    Lymphedema    Malignant neoplasm of upper-inner quadrant of left breast in female, estrogen receptor positive (HCC)    Morbid obesity with BMI of 60.0-69.9, adult (HCC)    Pneumonia    T2DM (type 2 diabetes mellitus) (HCC)    Thoracic ascending aortic aneurysm (HCC) 09/25/2022   a.) CT chest 09/25/2022: 4.2 cm   Umbilical hernia    Urinary tract infection due to ESBL Klebsiella    Vitamin D deficiency    Past Surgical History:  Procedure Laterality Date   ABDOMINAL HYSTERECTOMY     x2   BREAST BIOPSY Right 1990s   neg   BREAST BIOPSY Right 2000s   neg   BREAST BIOPSY Left 11/24/2020   u/s bx 10:30/8 cmfn-"heart" marker   BREAST EXCISIONAL BIOPSY Right    neg   BREAST LUMPECTOMY     BREAST LUMPECTOMY WITH RADIOFREQUENCY TAG IDENTIFICATION Left 12/10/2020   Procedure: BREAST LUMPECTOMY WITH RADIOFREQUENCY TAG IDENTIFICATION;  Surgeon: Campbell Lerner, MD;  Location: [facility];  Service: General;  Laterality: Left;   COLONOSCOPY     CYSTOSCOPY/URETEROSCOPY/HOLMIUM  LASER/STENT PLACEMENT Right 12/10/2022   Procedure: CYSTOSCOPY/URETEROSCOPY/HOLMIUM LASER/STENT PLACEMENT;  Surgeon: Sondra Come, MD;  Location: [facility];  Service: Urology;  Laterality: Right;   DILATION AND CURETTAGE OF UTERUS     EYE SURGERY  2004   cataract surgery and laser surgery   Salpingo-oophorectomy     Patient Active Problem List   Diagnosis Date Noted   Kidney stones 12/08/2022   Lymphedema 09/25/2022   Hypoxia 09/25/2022   Edema 01/30/2021   Malignant neoplasm of upper-inner quadrant of left breast in female, estrogen receptor positive (HCC) 12/01/2020   Goals of care, counseling/discussion 12/01/2020   Morbid obesity (HCC) 08/08/2018   BMI 60.0-69.9, adult (HCC) 08/08/2018   Controlled diabetes mellitus type 2 with complications (HCC) 08/08/2018   Hyperlipidemia 08/08/2018   Hypertension associated with diabetes (HCC) 08/08/2018   Arthritis of both knees 08/08/2018   Vitamin D deficiency 08/08/2018   PCP: Olevia Perches, DO  REFERRING PROVIDER: Gwynn Burly, DO  REFERRING DIAG: I89.0  THERAPY DIAG:  Lymphedema, not elsewhere classified [I89.0]  Rationale for Evaluation and Treatment: Rehabilitation  ONSET DATE: 1 yr ago without known precipitating event- by report  SUBJECTIVE:  SUBJECTIVE STATEMENT: Aleicia Badalamenti presents for OT treatment to address BLE lymphedema, L>R. Pt is accompanied by her spouse, [name], who assists her with transfers and with compression bandaging. Pt denies LE-related leg pain. She arrives seated in a transport wheelchair with compression wraps in place on her R leg , and custom compression stocking in place on her L LEG. Pt denies LE related pain today.  Tobacco Use: Low Risk  (12/24/2022)   Patient History    Smoking Tobacco Use: Never    Smokeless  Tobacco Use: Never    Passive Exposure: Never   PERTINENT HISTORY: HTN, Obesity w BMI 60-69.9, DM, B knee arthritis, Hx L breast cancer, recurrent L leg cellulitis. MRSA test results pending  PAIN: Are you having pain? Denies LE related pain. Endorses OA knee pain and kidney stone -related pain  PRECAUTIONS: Fall and Other: lymphedema precautions, DM. Hx L Br Ca  PRIOR LEVEL OF FUNCTION: Independent with household mobility with device, Requires assistive device for independence, Needs assistance with homemaking, Needs assistance with gait, and Needs assistance with transfers  PATIENT GOALS: ... Get improvement for my condition...., keep lymphedema from getting worse, and reduce infection risk"  LYMPHEDEMA ASSESSMENTS:  FOTO Functional Outcomes Measure: Initial 33%  Lymphedema Life Impact Scale (LLIS): 22.06%  BLE COMPARATIVE LIMB VOLUMETRICS Initial 09/14/22  LANDMARK RIGHT    R LEG (A-D) 7717.1 ml  R THIGH (E-G) ml  R FULL LIMB (A-G) ml  Limb Volume differential (LVD)  %  Volume change since initial %  Volume change overall V  (Blank rows = not tested)  LANDMARK LEFT   L LEG (A-D) 7752.1 ml  L THIGH (E-G) ml  L FULL LIMB (A-G) ml  Limb Volume differential (LVD)  Leg LVD measures 0.5%, L>R   Volume change since initial %  Volume change overall %   (Blank rows = not tested)    LLE COMPARATIVE LIMB VOLUMETRICS 9th visit 11/05/22  LANDMARK LEFT   L LEG (A-D) 5445.7 ml  L THIGH (E-G) ml  L FULL LIMB (A-G) ml  Limb Volume differential (LVD)    Volume change since initial  L LEG reduced in volume by 29.43% since 09/14/22  Volume change overall %  (Blank rows = not tested)   LLE COMPARATIVE LIMB VOLUMETRICS 16 th visit 12/08/22  LANDMARK LEFT   L LEG (A-D) 5445.7 ml  L THIGH (E-G) ml  L FULL LIMB (A-G) ml  Limb Volume differential (LVD)    Volume change since initial  L LEG is INCREASED in volume by 2.36% today, which is essentially stable since last measured on 11/05/22   Volume change overall  L LEG reduced in volume by 29.43% since 09/14/22   R LE COMPARATIVE LIMB VOLUMETRICS   19th visit 12/27/22   [facility] RIGHT    R LEG (A-D) 5951.1 ml  R THIGH (E-G) ml  R FULL LIMB (A-G) ml  Limb Volume differential (LVD)  %  Volume change since initial R LEG volume is reduced  by 23.3% since initially measured on 09/14/22.  Volume change overall V  (Blank rows = not tested)  TODAY'S TREATMENT:  RLE comparative limb volumetrics in prep for progress note next session  Knee length RLE multilayer compression wraps- as established MLD as established to RLE- simultaneous skin care  PATIENT EDUCATION:  Continued Pt/ CG edu for lymphedema self care home program throughout session. Topics include outcome of comparative limb volumetrics- starting limb volume differentials (LVDs), technology and gradient techniques used for short stretch, multilayer compression wrapping, simple self-MLD, therapeutic lymphatic pumping exercises, skin/nail care, LE precautions,. compression garment recommendations and specifications, wear and care schedule and compression garment donning / doffing w assistive devices. Discussed progress towards all OT goals since commencing CDT. All questions answered to the Pt's satisfaction. Good return. Person educated: Patient and Spouse Education method: Explanation, Demonstration, and Handouts Education comprehension: verbalized understanding, returned demonstration, verbal cues required, and needs further education  Lymphedema HOME PROGRAM:Complete Decongestive Therapy (CDT) Self-Management Phase Daily simple self-MLD Daily BLE skin care and inspection Daily lymphatic pumping there ex Appropriate daily compression- knee length, will require max caregiver A to  and doff Leg elevation when seated  ASSESSMENT: CLINICAL  IMPRESSION:  RLE comparative limb volumetrics reveal the R LEG limb volume is reduced  by 23.3% since initially measured on 09/14/22. This value meets and exceeds the initial 10% volume reduction goal. Remainder of session dedicated to manual therapy and reapplication of compression wraps. Pt demonstrates excellent progress towards volume reduction goal to date! Cont as per POC.  OBJECTIVE IMPAIRMENTS: Abnormal gait, decreased activity tolerance, decreased balance, decreased endurance, decreased knowledge of condition, decreased knowledge of use of DME, decreased mobility, difficulty walking, decreased ROM, decreased strength, increased edema, impaired perceived functional ability, postural dysfunction, obesity, pain, and chronic progressive limb swelling with elevated infection risk and risk of non-healing wounds .   ACTIVITY LIMITATIONS: carrying, lifting, bending, sitting, standing, squatting, sleeping, stairs, transfers, bed mobility, bathing, toileting, dressing, hygiene/grooming, and caring for others  PARTICIPATION LIMITATIONS: meal prep, cleaning, laundry, driving, shopping, community activity, occupation, yard work, and church  PERSONAL FACTORS: 3+ comorbidities: HTN, Stage III Obesity, B knee arthritis pain and inflammation and impaired AROM 2/2 body habitus  are also affecting patient's functional outcome.   REHAB POTENTIAL:  Fair with daily CG assistance throughout Intensive Phase CDT.  Poor without daily CG assistance with all LE self care home program components  GOALS: Goals reviewed with patient? Yes  SHORT TERM GOALS: Target date: 4th OT Rx visit   Pt will demonstrate understanding of lymphedema precautions and prevention strategies with modified independence using a printed reference to identify at least 5 precautions and discussing how s/he may implement them into daily life to reduce risk of progression with modified assistance ( printed reference). Baseline: modified  independence ( printed reference) Goal status: 11/05/22 GOAL MET  2.  Pt will be able to apply multilayer, thigh length, compression wraps using gradient techniques with Max caregiver assistance to decrease limb volume, to limit infection risk, and to limit lymphedema progression.  Baseline: Max A Goal status: 11/05/22 GOAL MET  LONG TERM GOALS: Target date: 12/06/22  Given this patient's Intake score of 22.06 % on the Lymphedema Life Impact Scale (LLIS), patient will experience a reduction of at least 5 points in her perceived level of functional impairment resulting from lymphedema to improve functional performance and quality of life (QOL). Baseline: 22.06 % Goal status ONGOING-PROGRESSING  2.  Given this patient's Intake score of 33 % on the functional outcomes FOTO tool, patient will experience an increase in function of 3 points to improve basic and instrumental ADLs performance, including lymphedema self-care.  Baseline: 33 % Goal status: ONGOING-PROGRESSING  3.  During Intensive phase CDT Pt will achieve at least 85% compliance with all lymphedema self-care home program components, including  daily skin care, multilayer , gradient compression wraps with daily changes, daily simple self MLD and daily lymphatic pumping therex to achieve optimal clinical outcome and to habituate self care regime for optimal LE self-management over time. Baseline: Dependent Goal status:11/05/22 GOAL MET  4.  Pt will achieve at least a 10% volume reductions bilaterally below the knees to return limb to more typical size and shape, to limit infection risk and LE progression, to decrease pain, to improve function, and to improve body image and QOL. Baseline: Dependent Goal status:11/05/22 GOAL MET for L LEG w 29% reduction to date.   12/29/22 GOAL MET for R LEG w 23.3% reduction achieved to date.  5.  Pt will be able to don and doff appropriate compression garments and devices using assistive devices and extra time  and Max CG assistance within 1 week of issue date for optimal lymphedema self-care. Baseline: Dependent Goal status: 12/27/22 GOAL MET PLAN:  PT FREQUENCY: 2 x/week  PT DURATION: 12  weeks other: and PRN  PLANNED INTERVENTIONS: Therapeutic exercises, Therapeutic activity, Patient/Family education, Self Care, DME instructions, Manual lymph drainage, Compression bandaging, Manual therapy, and skin care during MLD, fit with appropriate custom compression garments and devices, fit with appropriate compression device  which  follows lymphatic pathways and anatomic distribution  PLAN FOR NEXT SESSION:  Finish assessing LLE garment fit and function Commence RLE/RLQ CDT including  RLE multilayer compression bandages RLE MLD w simultaneous skin care  Loel Dubonnet, MS, OTR/L, CLT-LANA 12/28/22 12:05 PM

## 2022-12-28 LAB — CULTURE, URINE COMPREHENSIVE

## 2022-12-29 ENCOUNTER — Encounter: Payer: Medicare Other | Admitting: Occupational Therapy

## 2022-12-30 ENCOUNTER — Ambulatory Visit: Payer: Medicare Other | Admitting: Occupational Therapy

## 2022-12-30 ENCOUNTER — Encounter: Payer: Self-pay | Admitting: Occupational Therapy

## 2022-12-30 DIAGNOSIS — I890 Lymphedema, not elsewhere classified: Secondary | ICD-10-CM | POA: Diagnosis not present

## 2022-12-30 NOTE — Therapy (Signed)
OUTPATIENT OCCUPATIONAL THERAPY TREATMENT NOTE AND PROGRESS REPORT  BILATERAL LOWER EXTREMITY LYMPHEDEMA  Patient Name: FAVOR MASSOTH MRN: [id number] DOB:1950-10-24, 72 y.o., female Today's Date: 12/30/2022  REPORTING PERIOD: 11/12/22 - 12/30/22   END OF SESSION:   OT End of Session - 12/30/22 1408     Visit Number 20    Number of Visits 36    Date for OT Re-Evaluation 03/24/23    OT Start Time 0108    OT Stop Time 0208    OT Time Calculation (min) 60 min    Activity Tolerance Patient tolerated treatment well;No increased pain    Behavior During Therapy WFL for tasks assessed/performed               Past Medical History:  Diagnosis Date   Aortic atherosclerosis (HCC)    Arthritis of both knees    Breast cancer (HCC)    Cataract    Genitourinary syndrome of menopause    Hiatal hernia    History of kidney stones    Hyperlipidemia    Hypertension    Long term current use of aromatase inhibitor    a.) letrozole   Long term current use of aspirin    Lymphedema    Malignant neoplasm of upper-inner quadrant of left breast in female, estrogen receptor positive (HCC)    Morbid obesity with BMI of 60.0-69.9, adult (HCC)    Pneumonia    T2DM (type 2 diabetes mellitus) (HCC)    Thoracic ascending aortic aneurysm (HCC) 09/25/2022   a.) CT chest 09/25/2022: 4.2 cm   Umbilical hernia    Urinary tract infection due to ESBL Klebsiella    Vitamin D deficiency    Past Surgical History:  Procedure Laterality Date   ABDOMINAL HYSTERECTOMY     x2   BREAST BIOPSY Right 1990s   neg   BREAST BIOPSY Right 2000s   neg   BREAST BIOPSY Left 11/24/2020   u/s bx 10:30/8 cmfn-"heart" marker   BREAST EXCISIONAL BIOPSY Right    neg   BREAST LUMPECTOMY     BREAST LUMPECTOMY WITH RADIOFREQUENCY TAG IDENTIFICATION Left 12/10/2020   Procedure: BREAST LUMPECTOMY WITH RADIOFREQUENCY TAG IDENTIFICATION;  Surgeon: Campbell Lerner, MD;  Location: [facility];  Service: General;  Laterality:  Left;   COLONOSCOPY     CYSTOSCOPY/URETEROSCOPY/HOLMIUM LASER/STENT PLACEMENT Right 12/10/2022   Procedure: CYSTOSCOPY/URETEROSCOPY/HOLMIUM LASER/STENT PLACEMENT;  Surgeon: Sondra Come, MD;  Location: [facility];  Service: Urology;  Laterality: Right;   DILATION AND CURETTAGE OF UTERUS     EYE SURGERY  2004   cataract surgery and laser surgery   Salpingo-oophorectomy     Patient Active Problem List   Diagnosis Date Noted   Kidney stones 12/08/2022   Lymphedema 09/25/2022   Hypoxia 09/25/2022   Edema 01/30/2021   Malignant neoplasm of upper-inner quadrant of left breast in female, estrogen receptor positive (HCC) 12/01/2020   Goals of care, counseling/discussion 12/01/2020   Morbid obesity (HCC) 08/08/2018   BMI 60.0-69.9, adult (HCC) 08/08/2018   Controlled diabetes mellitus type 2 with complications (HCC) 08/08/2018   Hyperlipidemia 08/08/2018   Hypertension associated with diabetes (HCC) 08/08/2018   Arthritis of both knees 08/08/2018   Vitamin D deficiency 08/08/2018   PCP: Olevia Perches, DO  REFERRING PROVIDER: Gwynn Burly, DO  REFERRING DIAG: I89.0  THERAPY DIAG:  Lymphedema, not elsewhere classified [I89.0]  Rationale for Evaluation and Treatment: Rehabilitation  ONSET DATE: 1 yr ago without known precipitating event- by report  SUBJECTIVE:  SUBJECTIVE STATEMENT: Jeliyah Debaun presents for OT treatment to address BLE lymphedema, L>R. Pt is accompanied by her spouse, [name], who assists her with transfers and with compression bandaging. Pt denies LE-related leg pain. She arrives seated in a transport wheelchair with compression wraps in place on her R leg , and custom compression stocking in place on her L LEG. Pt denies LE related pain today.  Tobacco Use: Low Risk  (12/30/2022)    Patient History    Smoking Tobacco Use: Never    Smokeless Tobacco Use: Never    Passive Exposure: Never   PERTINENT HISTORY: HTN, Obesity w BMI 60-69.9, DM, B knee arthritis, Hx L breast cancer, recurrent L leg cellulitis. MRSA test results pending  PAIN: Are you having pain? Denies LE related pain. Endorses OA knee pain and kidney stone -related pain  PRECAUTIONS: Fall and Other: lymphedema precautions, DM. Hx L Br Ca  PRIOR LEVEL OF FUNCTION: Independent with household mobility with device, Requires assistive device for independence, Needs assistance with homemaking, Needs assistance with gait, and Needs assistance with transfers  PATIENT GOALS: ... Get improvement for my condition...., keep lymphedema from getting worse, and reduce infection risk"  LYMPHEDEMA ASSESSMENTS:  FOTO Functional Outcomes Measure: Initial 33%  Lymphedema Life Impact Scale (LLIS): 22.06%  BLE COMPARATIVE LIMB VOLUMETRICS Initial 09/14/22  LANDMARK RIGHT    R LEG (A-D) 7717.1 ml  R THIGH (E-G) ml  R FULL LIMB (A-G) ml  Limb Volume differential (LVD)  %  Volume change since initial %  Volume change overall V  (Blank rows = not tested)  LANDMARK LEFT   L LEG (A-D) 7752.1 ml  L THIGH (E-G) ml  L FULL LIMB (A-G) ml  Limb Volume differential (LVD)  Leg LVD measures 0.5%, L>R   Volume change since initial %  Volume change overall %   (Blank rows = not tested)    LLE COMPARATIVE LIMB VOLUMETRICS 9th visit 11/05/22  LANDMARK LEFT   L LEG (A-D) 5445.7 ml  L THIGH (E-G) ml  L FULL LIMB (A-G) ml  Limb Volume differential (LVD)    Volume change since initial  L LEG reduced in volume by 29.43% since 09/14/22  Volume change overall %  (Blank rows = not tested)   LLE COMPARATIVE LIMB VOLUMETRICS 16 th visit 12/08/22  LANDMARK LEFT   L LEG (A-D) 5445.7 ml  L THIGH (E-G) ml  L FULL LIMB (A-G) ml  Limb Volume differential (LVD)    Volume change since initial  L LEG is INCREASED in volume by 2.36% today,  which is essentially stable since last measured on 11/05/22  Volume change overall  L LEG reduced in volume by 29.43% since 09/14/22   R LE COMPARATIVE LIMB VOLUMETRICS   19th visit 12/27/22   [facility] RIGHT    R LEG (A-D) 5951.1 ml  R THIGH (E-G) ml  R FULL LIMB (A-G) ml  Limb Volume differential (LVD)  %  Volume change since initial R LEG volume is reduced  by 23.3% since initially measured on 09/14/22.  Volume change overall V  (Blank rows = not tested)  TODAY'S TREATMENT:  RLE comparative limb volumetrics in prep for progress note next session  Knee length RLE multilayer compression wraps- as established MLD as established to RLE- simultaneous skin care  PATIENT EDUCATION:  Continued Pt/ CG edu for lymphedema self care home program throughout session. Topics include outcome of comparative limb volumetrics- starting limb volume differentials (LVDs), technology and gradient techniques used for short stretch, multilayer compression wrapping, simple self-MLD, therapeutic lymphatic pumping exercises, skin/nail care, LE precautions,. compression garment recommendations and specifications, wear and care schedule and compression garment donning / doffing w assistive devices. Discussed progress towards all OT goals since commencing CDT. All questions answered to the Pt's satisfaction. Good return. Person educated: Patient and Spouse Education method: Explanation, Demonstration, and Handouts Education comprehension: verbalized understanding, returned demonstration, verbal cues required, and needs further education  Lymphedema HOME PROGRAM:Complete Decongestive Therapy (CDT) Self-Management Phase Daily simple self-MLD Daily BLE skin care and inspection Daily lymphatic pumping there ex Appropriate daily compression- knee length, will require max caregiver A to  and  doff Leg elevation when seated  ASSESSMENT: CLINICAL IMPRESSION: Pt tolerated RLE MLD with simultaneous skin care  without increased pain. Hyperkeratosis is slow to respond to CDT on this side. Reapplied multilayer wraps using gradient techniques as established. Cont as per POC.Cont as per POC.  OBJECTIVE IMPAIRMENTS: Abnormal gait, decreased activity tolerance, decreased balance, decreased endurance, decreased knowledge of condition, decreased knowledge of use of DME, decreased mobility, difficulty walking, decreased ROM, decreased strength, increased edema, impaired perceived functional ability, postural dysfunction, obesity, pain, and chronic progressive limb swelling with elevated infection risk and risk of non-healing wounds .   ACTIVITY LIMITATIONS: carrying, lifting, bending, sitting, standing, squatting, sleeping, stairs, transfers, bed mobility, bathing, toileting, dressing, hygiene/grooming, and caring for others  PARTICIPATION LIMITATIONS: meal prep, cleaning, laundry, driving, shopping, community activity, occupation, yard work, and church  PERSONAL FACTORS: 3+ comorbidities: HTN, Stage III Obesity, B knee arthritis pain and inflammation and impaired AROM 2/2 body habitus  are also affecting patient's functional outcome.   REHAB POTENTIAL:  Fair with daily CG assistance throughout Intensive Phase CDT.  Poor without daily CG assistance with all LE self care home program components  GOALS: Goals reviewed with patient? Yes  SHORT TERM GOALS: Target date: 4th OT Rx visit   Pt will demonstrate understanding of lymphedema precautions and prevention strategies with modified independence using a printed reference to identify at least 5 precautions and discussing how s/he may implement them into daily life to reduce risk of progression with modified assistance ( printed reference). Baseline: modified independence ( printed reference) Goal status: 11/05/22 GOAL MET  2.  Pt will be able to  apply multilayer, thigh length, compression wraps using gradient techniques with Max caregiver assistance to decrease limb volume, to limit infection risk, and to limit lymphedema progression.  Baseline: Max A Goal status: 11/05/22 GOAL MET  LONG TERM GOALS: Target date: 12/06/22  Given this patient's Intake score of 22.06 % on the Lymphedema Life Impact Scale (LLIS), patient will experience a reduction of at least 5 points in her perceived level of functional impairment resulting from lymphedema to improve functional performance and quality of life (QOL). Baseline: 22.06 % Goal status ONGOING-PROGRESSING  2.  Given this patient's Intake score of 33 % on the functional outcomes FOTO tool, patient will experience an increase in function of 3 points to improve basic and instrumental ADLs performance, including lymphedema self-care.  Baseline: 33 % Goal status: ONGOING-PROGRESSING  3.  During Intensive phase CDT Pt will achieve at least 85% compliance with all lymphedema self-care home program components, including  daily skin care, multilayer , gradient compression wraps with daily changes, daily simple self MLD and daily lymphatic pumping therex to achieve optimal clinical outcome and to habituate self care regime for optimal LE self-management over time. Baseline: Dependent Goal status:11/05/22 GOAL MET  4.  Pt will achieve at least a 10% volume reductions bilaterally below the knees to return limb to more typical size and shape, to limit infection risk and LE progression, to decrease pain, to improve function, and to improve body image and QOL. Baseline: Dependent Goal status:11/05/22 GOAL MET for L LEG w 29% reduction to date.   12/29/22 GOAL MET for R LEG w 23.3% reduction achieved to date.  5.  Pt will be able to don and doff appropriate compression garments and devices using assistive devices and extra time and Max CG assistance within 1 week of issue date for optimal lymphedema  self-care. Baseline: Dependent Goal status: 12/27/22 GOAL MET PLAN:  PT FREQUENCY: 2 x/week  PT DURATION: 12  weeks other: and PRN  PLANNED INTERVENTIONS: Therapeutic exercises, Therapeutic activity, Patient/Family education, Self Care, DME instructions, Manual lymph drainage, Compression bandaging, Manual therapy, and skin care during MLD, fit with appropriate custom compression garments and devices, fit with appropriate compression device  which  follows lymphatic pathways and anatomic distribution  PLAN FOR NEXT SESSION:  Finish assessing LLE garment fit and function Commence RLE/RLQ CDT including  RLE multilayer compression bandages RLE MLD w simultaneous skin care  Loel Dubonnet, MS, OTR/L, CLT-LANA 12/30/22 3:59 PM

## 2022-12-31 ENCOUNTER — Encounter: Payer: Medicare Other | Admitting: Occupational Therapy

## 2023-01-04 ENCOUNTER — Ambulatory Visit: Payer: Medicare Other | Admitting: Occupational Therapy

## 2023-01-06 ENCOUNTER — Ambulatory Visit: Payer: Medicare Other | Attending: Infectious Diseases | Admitting: Occupational Therapy

## 2023-01-06 DIAGNOSIS — I890 Lymphedema, not elsewhere classified: Secondary | ICD-10-CM | POA: Insufficient documentation

## 2023-01-06 NOTE — Therapy (Signed)
OUTPATIENT OCCUPATIONAL THERAPY TREATMENT NOTE  BILATERAL LOWER EXTREMITY LYMPHEDEMA  Patient Name: Sharon Ware MRN: [id number] DOB:Aug 12, 1950, 72 y.o., female Today's Date: 01/07/2023   END OF SESSION:   OT End of Session - 01/06/23 1419     Visit Number 21    Number of Visits 36    Date for OT Re-Evaluation 03/24/23    OT Start Time 0210    OT Stop Time 0311    OT Time Calculation (min) 61 min    Activity Tolerance Patient tolerated treatment well;No increased pain    Behavior During Therapy WFL for tasks assessed/performed               Past Medical History:  Diagnosis Date   Aortic atherosclerosis (HCC)    Arthritis of both knees    Breast cancer (HCC)    Cataract    Genitourinary syndrome of menopause    Hiatal hernia    History of kidney stones    Hyperlipidemia    Hypertension    Long term current use of aromatase inhibitor    a.) letrozole   Long term current use of aspirin    Lymphedema    Malignant neoplasm of upper-inner quadrant of left breast in female, estrogen receptor positive (HCC)    Morbid obesity with BMI of 60.0-69.9, adult (HCC)    Pneumonia    T2DM (type 2 diabetes mellitus) (HCC)    Thoracic ascending aortic aneurysm (HCC) 09/25/2022   a.) CT chest 09/25/2022: 4.2 cm   Umbilical hernia    Urinary tract infection due to ESBL Klebsiella    Vitamin D deficiency    Past Surgical History:  Procedure Laterality Date   ABDOMINAL HYSTERECTOMY     x2   BREAST BIOPSY Right 1990s   neg   BREAST BIOPSY Right 2000s   neg   BREAST BIOPSY Left 11/24/2020   u/s bx 10:30/8 cmfn-"heart" marker   BREAST EXCISIONAL BIOPSY Right    neg   BREAST LUMPECTOMY     BREAST LUMPECTOMY WITH RADIOFREQUENCY TAG IDENTIFICATION Left 12/10/2020   Procedure: BREAST LUMPECTOMY WITH RADIOFREQUENCY TAG IDENTIFICATION;  Surgeon: Campbell Lerner, MD;  Location: [facility];  Service: General;  Laterality: Left;   COLONOSCOPY     CYSTOSCOPY/URETEROSCOPY/HOLMIUM  LASER/STENT PLACEMENT Right 12/10/2022   Procedure: CYSTOSCOPY/URETEROSCOPY/HOLMIUM LASER/STENT PLACEMENT;  Surgeon: Sondra Come, MD;  Location: [facility];  Service: Urology;  Laterality: Right;   DILATION AND CURETTAGE OF UTERUS     EYE SURGERY  2004   cataract surgery and laser surgery   Salpingo-oophorectomy     Patient Active Problem List   Diagnosis Date Noted   Kidney stones 12/08/2022   Lymphedema 09/25/2022   Hypoxia 09/25/2022   Edema 01/30/2021   Malignant neoplasm of upper-inner quadrant of left breast in female, estrogen receptor positive (HCC) 12/01/2020   Goals of care, counseling/discussion 12/01/2020   Morbid obesity (HCC) 08/08/2018   BMI 60.0-69.9, adult (HCC) 08/08/2018   Controlled diabetes mellitus type 2 with complications (HCC) 08/08/2018   Hyperlipidemia 08/08/2018   Hypertension associated with diabetes (HCC) 08/08/2018   Arthritis of both knees 08/08/2018   Vitamin D deficiency 08/08/2018   PCP: Olevia Perches, DO  REFERRING PROVIDER: Gwynn Burly, DO  REFERRING DIAG: I89.0  THERAPY DIAG:  Lymphedema, not elsewhere classified [I89.0]  Rationale for Evaluation and Treatment: Rehabilitation  ONSET DATE: 1 yr ago without known precipitating event- by report  SUBJECTIVE:  SUBJECTIVE STATEMENT: Sharon Ware presents for OT treatment to address BLE lymphedema, L>R. Pt is accompanied by her spouse, Sharon Ware, who assists her with transfers and with compression bandaging. Pt denies LE-related leg pain. She arrives seated in a transport wheelchair with compression wraps in place on her R leg , and custom compression stocking in place on her L LEG. Pt denies LE related pain today.  Tobacco Use: Low Risk  (12/30/2022)   Patient History    Smoking Tobacco Use: Never    Smokeless  Tobacco Use: Never    Passive Exposure: Never   PERTINENT HISTORY: HTN, Obesity w BMI 60-69.9, DM, B knee arthritis, Hx L breast cancer, recurrent L leg cellulitis. MRSA test results pending  PAIN: Are you having pain? Denies LE related pain. Endorses OA knee pain and kidney stone -related pain  PRECAUTIONS: Fall and Other: lymphedema precautions, DM. Hx L Br Ca  PRIOR LEVEL OF FUNCTION: Independent with household mobility with device, Requires assistive device for independence, Needs assistance with homemaking, Needs assistance with gait, and Needs assistance with transfers  PATIENT GOALS: ... Get improvement for my condition...., keep lymphedema from getting worse, and reduce infection risk"  LYMPHEDEMA ASSESSMENTS:  FOTO Functional Outcomes Measure: Initial 33%  Lymphedema Life Impact Scale (LLIS): 22.06%  BLE COMPARATIVE LIMB VOLUMETRICS Initial 09/14/22  LANDMARK RIGHT    R LEG (A-D) 7717.1 ml  R THIGH (E-G) ml  R FULL LIMB (A-G) ml  Limb Volume differential (LVD)  %  Volume change since initial %  Volume change overall V  (Blank rows = not tested)  LANDMARK LEFT   L LEG (A-D) 7752.1 ml  L THIGH (E-G) ml  L FULL LIMB (A-G) ml  Limb Volume differential (LVD)  Leg LVD measures 0.5%, L>R   Volume change since initial %  Volume change overall %   (Blank rows = not tested)    LLE COMPARATIVE LIMB VOLUMETRICS 9th visit 11/05/22  LANDMARK LEFT   L LEG (A-D) 5445.7 ml  L THIGH (E-G) ml  L FULL LIMB (A-G) ml  Limb Volume differential (LVD)    Volume change since initial  L LEG reduced in volume by 29.43% since 09/14/22  Volume change overall %  (Blank rows = not tested)   LLE COMPARATIVE LIMB VOLUMETRICS 16 th visit 12/08/22  LANDMARK LEFT   L LEG (A-D) 5445.7 ml  L THIGH (E-G) ml  L FULL LIMB (A-G) ml  Limb Volume differential (LVD)    Volume change since initial  L LEG is INCREASED in volume by 2.36% today, which is essentially stable since last measured on 11/05/22   Volume change overall  L LEG reduced in volume by 29.43% since 09/14/22   R LE COMPARATIVE LIMB VOLUMETRICS   19th visit 12/27/22   [facility] RIGHT    R LEG (A-D) 5951.1 ml  R THIGH (E-G) ml  R FULL LIMB (A-G) ml  Limb Volume differential (LVD)  %  Volume change since initial R LEG volume is reduced  by 23.3% since initially measured on 09/14/22.  Volume change overall V  (Blank rows = not tested)  TODAY'S TREATMENT:  RLE comparative limb volumetrics in prep for progress note next session  Knee length RLE multilayer compression wraps- as established MLD as established to RLE- simultaneous skin care  PATIENT EDUCATION:  Continued Pt/ CG edu for lymphedema self care home program throughout session. Topics include outcome of comparative limb volumetrics- starting limb volume differentials (LVDs), technology and gradient techniques used for short stretch, multilayer compression wrapping, simple self-MLD, therapeutic lymphatic pumping exercises, skin/nail care, LE precautions,. compression garment recommendations and specifications, wear and care schedule and compression garment donning / doffing w assistive devices. Discussed progress towards all OT goals since commencing CDT. All questions answered to the Pt's satisfaction. Good return. Person educated: Patient and Spouse Education method: Explanation, Demonstration, and Handouts Education comprehension: verbalized understanding, returned demonstration, verbal cues required, and needs further education  Lymphedema HOME PROGRAM:Complete Decongestive Therapy (CDT) Self-Management Phase Daily simple self-MLD Daily BLE skin care and inspection Daily lymphatic pumping there ex Appropriate daily compression- knee length, will require max caregiver A to  and doff Leg elevation when seated  ASSESSMENT: CLINICAL  IMPRESSION: Pt tolerated RLE MLD with simultaneous skin care  without increased pain. Hyperkeratosis is slow to respond to CDT on this side. Skin is mildly dry and flaking. Reapplied multilayer wraps using gradient techniques as established. Cont as per POC.Cont as per POC.  OBJECTIVE IMPAIRMENTS: Abnormal gait, decreased activity tolerance, decreased balance, decreased endurance, decreased knowledge of condition, decreased knowledge of use of DME, decreased mobility, difficulty walking, decreased ROM, decreased strength, increased edema, impaired perceived functional ability, postural dysfunction, obesity, pain, and chronic progressive limb swelling with elevated infection risk and risk of non-healing wounds .   ACTIVITY LIMITATIONS: carrying, lifting, bending, sitting, standing, squatting, sleeping, stairs, transfers, bed mobility, bathing, toileting, dressing, hygiene/grooming, and caring for others  PARTICIPATION LIMITATIONS: meal prep, cleaning, laundry, driving, shopping, community activity, occupation, yard work, and church  PERSONAL FACTORS: 3+ comorbidities: HTN, Stage III Obesity, B knee arthritis pain and inflammation and impaired AROM 2/2 body habitus  are also affecting patient's functional outcome.   REHAB POTENTIAL:  Fair with daily CG assistance throughout Intensive Phase CDT.  Poor without daily CG assistance with all LE self care home program components  GOALS: Goals reviewed with patient? Yes  SHORT TERM GOALS: Target date: 4th OT Rx visit   Pt will demonstrate understanding of lymphedema precautions and prevention strategies with modified independence using a printed reference to identify at least 5 precautions and discussing how s/he may implement them into daily life to reduce risk of progression with modified assistance ( printed reference). Baseline: modified independence ( printed reference) Goal status: 11/05/22 GOAL MET  2.  Pt will be able to apply multilayer, thigh  length, compression wraps using gradient techniques with Max caregiver assistance to decrease limb volume, to limit infection risk, and to limit lymphedema progression.  Baseline: Max A Goal status: 11/05/22 GOAL MET  LONG TERM GOALS: Target date: 12/06/22  Given this patient's Intake score of 22.06 % on the Lymphedema Life Impact Scale (LLIS), patient will experience a reduction of at least 5 points in her perceived level of functional impairment resulting from lymphedema to improve functional performance and quality of life (QOL). Baseline: 22.06 % Goal status ONGOING-PROGRESSING  2.  Given this patient's Intake score of 33 % on the functional outcomes FOTO tool, patient will experience an increase in function of 3 points to improve basic and instrumental ADLs performance, including lymphedema self-care.  Baseline: 33 % Goal status: ONGOING-PROGRESSING  3.  During Intensive phase CDT Pt will achieve at least 85% compliance with all  lymphedema self-care home program components, including  daily skin care, multilayer , gradient compression wraps with daily changes, daily simple self MLD and daily lymphatic pumping therex to achieve optimal clinical outcome and to habituate self care regime for optimal LE self-management over time. Baseline: Dependent Goal status:11/05/22 GOAL MET  4.  Pt will achieve at least a 10% volume reductions bilaterally below the knees to return limb to more typical size and shape, to limit infection risk and LE progression, to decrease pain, to improve function, and to improve body image and QOL. Baseline: Dependent Goal status:11/05/22 GOAL MET for L LEG w 29% reduction to date.   12/29/22 GOAL MET for R LEG w 23.3% reduction achieved to date.  5.  Pt will be able to don and doff appropriate compression garments and devices using assistive devices and extra time and Max CG assistance within 1 week of issue date for optimal lymphedema self-care. Baseline: Dependent Goal  status: 12/27/22 GOAL MET PLAN:  PT FREQUENCY: 2 x/week  PT DURATION: 12  weeks other: and PRN  PLANNED INTERVENTIONS: Therapeutic exercises, Therapeutic activity, Patient/Family education, Self Care, DME instructions, Manual lymph drainage, Compression bandaging, Manual therapy, and skin care during MLD, fit with appropriate custom compression garments and devices, fit with appropriate compression device  which  follows lymphatic pathways and anatomic distribution  PLAN FOR NEXT SESSION:  Continue RLE/RLQ CDT including  RLE multilayer compression bandages RLE MLD w simultaneous skin care  Loel Dubonnet, MS, OTR/L, CLT-LANA 01/07/23 7:56 AM

## 2023-01-07 ENCOUNTER — Encounter: Payer: Medicare Other | Admitting: Occupational Therapy

## 2023-01-11 ENCOUNTER — Ambulatory Visit: Payer: Medicare Other | Admitting: Occupational Therapy

## 2023-01-11 ENCOUNTER — Encounter: Payer: Self-pay | Admitting: Occupational Therapy

## 2023-01-11 DIAGNOSIS — I890 Lymphedema, not elsewhere classified: Secondary | ICD-10-CM

## 2023-01-11 NOTE — Therapy (Unsigned)
OUTPATIENT OCCUPATIONAL THERAPY TREATMENT NOTE  BILATERAL LOWER EXTREMITY LYMPHEDEMA  Patient Name: Sharon Ware MRN: [id number] DOB:02-13-1951, 72 y.o., female Today's Date: 01/12/2023   END OF SESSION:   OT End of Session - 01/11/23 1312     Visit Number 22    Number of Visits 36    Date for OT Re-Evaluation 03/24/23    OT Start Time 0104    OT Stop Time 0205    OT Time Calculation (min) 61 min    Activity Tolerance Patient tolerated treatment well;No increased pain    Behavior During Therapy WFL for tasks assessed/performed               Past Medical History:  Diagnosis Date   Aortic atherosclerosis (HCC)    Arthritis of both knees    Breast cancer (HCC)    Cataract    Genitourinary syndrome of menopause    Hiatal hernia    History of kidney stones    Hyperlipidemia    Hypertension    Long term current use of aromatase inhibitor    a.) letrozole   Long term current use of aspirin    Lymphedema    Malignant neoplasm of upper-inner quadrant of left breast in female, estrogen receptor positive (HCC)    Morbid obesity with BMI of 60.0-69.9, adult (HCC)    Pneumonia    T2DM (type 2 diabetes mellitus) (HCC)    Thoracic ascending aortic aneurysm (HCC) 09/25/2022   a.) CT chest 09/25/2022: 4.2 cm   Umbilical hernia    Urinary tract infection due to ESBL Klebsiella    Vitamin D deficiency    Past Surgical History:  Procedure Laterality Date   ABDOMINAL HYSTERECTOMY     x2   BREAST BIOPSY Right 1990s   neg   BREAST BIOPSY Right 2000s   neg   BREAST BIOPSY Left 11/24/2020   u/s bx 10:30/8 cmfn-"heart" marker   BREAST EXCISIONAL BIOPSY Right    neg   BREAST LUMPECTOMY     BREAST LUMPECTOMY WITH RADIOFREQUENCY TAG IDENTIFICATION Left 12/10/2020   Procedure: BREAST LUMPECTOMY WITH RADIOFREQUENCY TAG IDENTIFICATION;  Surgeon: Campbell Lerner, MD;  Location: [facility];  Service: General;  Laterality: Left;   COLONOSCOPY     CYSTOSCOPY/URETEROSCOPY/HOLMIUM  LASER/STENT PLACEMENT Right 12/10/2022   Procedure: CYSTOSCOPY/URETEROSCOPY/HOLMIUM LASER/STENT PLACEMENT;  Surgeon: Sondra Come, MD;  Location: [facility];  Service: Urology;  Laterality: Right;   DILATION AND CURETTAGE OF UTERUS     EYE SURGERY  2004   cataract surgery and laser surgery   Salpingo-oophorectomy     Patient Active Problem List   Diagnosis Date Noted   Kidney stones 12/08/2022   Lymphedema 09/25/2022   Hypoxia 09/25/2022   Edema 01/30/2021   Malignant neoplasm of upper-inner quadrant of left breast in female, estrogen receptor positive (HCC) 12/01/2020   Goals of care, counseling/discussion 12/01/2020   Morbid obesity (HCC) 08/08/2018   BMI 60.0-69.9, adult (HCC) 08/08/2018   Controlled diabetes mellitus type 2 with complications (HCC) 08/08/2018   Hyperlipidemia 08/08/2018   Hypertension associated with diabetes (HCC) 08/08/2018   Arthritis of both knees 08/08/2018   Vitamin D deficiency 08/08/2018   PCP: Olevia Perches, DO  REFERRING PROVIDER: Gwynn Burly, DO  REFERRING DIAG: I89.0  THERAPY DIAG:  Lymphedema, not elsewhere classified [I89.0]  Rationale for Evaluation and Treatment: Rehabilitation  ONSET DATE: 1 yr ago without known precipitating event- by report  SUBJECTIVE:  SUBJECTIVE STATEMENT: Sharon Ware presents for OT treatment to address BLE lymphedema, L>R. Pt is accompanied by her spouse, Sharon Ware, who assists her with transfers and with compression bandaging. Pt denies LE-related leg pain. She arrives seated in a transport wheelchair with compression wraps in place on her R leg , and custom compression stocking in place on her L LEG.  Tobacco Use: Low Risk  (01/12/2023)   Patient History    Smoking Tobacco Use: Never    Smokeless Tobacco Use: Never    Passive  Exposure: Never   PERTINENT HISTORY: HTN, Obesity w BMI 60-69.9, DM, B knee arthritis, Hx L breast cancer, recurrent L leg cellulitis. MRSA test results pending  PAIN: Are you having pain? Denies LE related pain. Endorses OA knee pain and kidney stone -related pain  PRECAUTIONS: Fall and Other: lymphedema precautions, DM. Hx L Br Ca  PRIOR LEVEL OF FUNCTION: Independent with household mobility with device, Requires assistive device for independence, Needs assistance with homemaking, Needs assistance with gait, and Needs assistance with transfers  PATIENT GOALS: ... Get improvement for my condition...., keep lymphedema from getting worse, and reduce infection risk"  LYMPHEDEMA ASSESSMENTS:  FOTO Functional Outcomes Measure: Initial 33%  Lymphedema Life Impact Scale (LLIS): 22.06%  BLE COMPARATIVE LIMB VOLUMETRICS Initial 09/14/22  LANDMARK RIGHT    R LEG (A-D) 7717.1 ml  R THIGH (E-G) ml  R FULL LIMB (A-G) ml  Limb Volume differential (LVD)  %  Volume change since initial %  Volume change overall V  (Blank rows = not tested)  LANDMARK LEFT   L LEG (A-D) 7752.1 ml  L THIGH (E-G) ml  L FULL LIMB (A-G) ml  Limb Volume differential (LVD)  Leg LVD measures 0.5%, L>R   Volume change since initial %  Volume change overall %   (Blank rows = not tested)    LLE COMPARATIVE LIMB VOLUMETRICS 9th visit 11/05/22  LANDMARK LEFT   L LEG (A-D) 5445.7 ml  L THIGH (E-G) ml  L FULL LIMB (A-G) ml  Limb Volume differential (LVD)    Volume change since initial  L LEG reduced in volume by 29.43% since 09/14/22  Volume change overall %  (Blank rows = not tested)   LLE COMPARATIVE LIMB VOLUMETRICS 16 th visit 12/08/22  LANDMARK LEFT   L LEG (A-D) 5445.7 ml  L THIGH (E-G) ml  L FULL LIMB (A-G) ml  Limb Volume differential (LVD)    Volume change since initial  L LEG is INCREASED in volume by 2.36% today, which is essentially stable since last measured on 11/05/22  Volume change overall  L LEG  reduced in volume by 29.43% since 09/14/22   R LE COMPARATIVE LIMB VOLUMETRICS   19th visit 12/27/22   [facility] RIGHT    R LEG (A-D) 5951.1 ml  R THIGH (E-G) ml  R FULL LIMB (A-G) ml  Limb Volume differential (LVD)  %  Volume change since initial R LEG volume is reduced  by 23.3% since initially measured on 09/14/22.  Volume change overall V  (Blank rows = not tested)  TODAY'S TREATMENT:  Knee length RLE multilayer compression wraps- as established MLD as established to RLE- simultaneous skin care Pt/ family edu  PATIENT EDUCATION:  Continued Pt/ CG edu for lymphedema self care home program throughout session. Topics include outcome of comparative limb volumetrics- starting limb volume differentials (LVDs), technology and gradient techniques used for short stretch, multilayer compression wrapping, simple self-MLD, therapeutic lymphatic pumping exercises, skin/nail care, LE precautions,. compression garment recommendations and specifications, wear and care schedule and compression garment donning / doffing w assistive devices. Discussed progress towards all OT goals since commencing CDT. All questions answered to the Pt's satisfaction. Good return. Person educated: Patient and Spouse Education method: Explanation, Demonstration, and Handouts Education comprehension: verbalized understanding, returned demonstration, verbal cues required, and needs further education  Lymphedema HOME PROGRAM:Complete Decongestive Therapy (CDT) Self-Management Phase Daily simple self-MLD Daily BLE skin care and inspection Daily lymphatic pumping there ex Appropriate daily compression- knee length, will require max caregiver A to  and doff Leg elevation when seated  ASSESSMENT: CLINICAL IMPRESSION: Pt tolerated RLE MLD with simultaneous skin care as established without  increased pain. Hyperkeratosis is slow to respond to CDT on this side. As leg continues to reduce in volume hyperkeratosis becomes more raised on the surface, but is not sloughing off.  Pt advised to continue with daily skin care regime and not to try to flake skin overgrowth patches off to limit infection risk. Skin is much    better hydrated overall, but some mild dryness persists. Reapplied multilayer wraps using gradient techniques as established. Cont as per POC.  OBJECTIVE IMPAIRMENTS: Abnormal gait, decreased activity tolerance, decreased balance, decreased endurance, decreased knowledge of condition, decreased knowledge of use of DME, decreased mobility, difficulty walking, decreased ROM, decreased strength, increased edema, impaired perceived functional ability, postural dysfunction, obesity, pain, and chronic progressive limb swelling with elevated infection risk and risk of non-healing wounds .   ACTIVITY LIMITATIONS: carrying, lifting, bending, sitting, standing, squatting, sleeping, stairs, transfers, bed mobility, bathing, toileting, dressing, hygiene/grooming, and caring for others  PARTICIPATION LIMITATIONS: meal prep, cleaning, laundry, driving, shopping, community activity, occupation, yard work, and church  PERSONAL FACTORS: 3+ comorbidities: HTN, Stage III Obesity, B knee arthritis pain and inflammation and impaired AROM 2/2 body habitus  are also affecting patient's functional outcome.   REHAB POTENTIAL:  Fair with daily CG assistance throughout Intensive Phase CDT.  Poor without daily CG assistance with all LE self care home program components  GOALS: Goals reviewed with patient? Yes  SHORT TERM GOALS: Target date: 4th OT Rx visit   Pt will demonstrate understanding of lymphedema precautions and prevention strategies with modified independence using a printed reference to identify at least 5 precautions and discussing how s/he may implement them into daily life to reduce risk  of progression with modified assistance ( printed reference). Baseline: modified independence ( printed reference) Goal status: 11/05/22 GOAL MET  2.  Pt will be able to apply multilayer, thigh length, compression wraps using gradient techniques with Max caregiver assistance to decrease limb volume, to limit infection risk, and to limit lymphedema progression.  Baseline: Max A Goal status: 11/05/22 GOAL MET  LONG TERM GOALS: Target date: 12/06/22  Given this patient's Intake score of 22.06 % on the Lymphedema Life Impact Scale (LLIS), patient will experience a reduction of at least 5 points in her perceived level of functional impairment resulting from lymphedema to improve functional performance and quality of life (QOL). Baseline: 22.06 % Goal status ONGOING-PROGRESSING  2.  Given this patient's Intake score  of 33 % on the functional outcomes FOTO tool, patient will experience an increase in function of 3 points to improve basic and instrumental ADLs performance, including lymphedema self-care.  Baseline: 33 % Goal status: ONGOING-PROGRESSING  3.  During Intensive phase CDT Pt will achieve at least 85% compliance with all lymphedema self-care home program components, including  daily skin care, multilayer , gradient compression wraps with daily changes, daily simple self MLD and daily lymphatic pumping therex to achieve optimal clinical outcome and to habituate self care regime for optimal LE self-management over time. Baseline: Dependent Goal status:11/05/22 GOAL MET  4.  Pt will achieve at least a 10% volume reductions bilaterally below the knees to return limb to more typical size and shape, to limit infection risk and LE progression, to decrease pain, to improve function, and to improve body image and QOL. Baseline: Dependent Goal status:11/05/22 GOAL MET for L LEG w 29% reduction to date.   12/29/22 GOAL MET for R LEG w 23.3% reduction achieved to date.  5.  Pt will be able to don and doff  appropriate compression garments and devices using assistive devices and extra time and Max CG assistance within 1 week of issue date for optimal lymphedema self-care. Baseline: Dependent Goal status: 12/27/22 GOAL MET PLAN:  PT FREQUENCY: 2 x/week  PT DURATION: 12  weeks other: and PRN  PLANNED INTERVENTIONS: Therapeutic exercises, Therapeutic activity, Patient/Family education, Self Care, DME instructions, Manual lymph drainage, Compression bandaging, Manual therapy, and skin care during MLD, fit with appropriate custom compression garments and devices, fit with appropriate compression device  which  follows lymphatic pathways and anatomic distribution  PLAN FOR NEXT SESSION:  Continue RLE/RLQ CDT including  RLE multilayer compression bandages RLE MLD w simultaneous skin care  Loel Dubonnet, MS, OTR/L, CLT-LANA 01/12/23 2:38 PM

## 2023-01-12 ENCOUNTER — Encounter: Payer: Self-pay | Admitting: Occupational Therapy

## 2023-01-12 ENCOUNTER — Ambulatory Visit: Payer: Medicare Other | Admitting: Urology

## 2023-01-12 ENCOUNTER — Encounter: Payer: Self-pay | Admitting: Urology

## 2023-01-12 VITALS — BP 129/85 | HR 91 | Ht 63.0 in

## 2023-01-12 DIAGNOSIS — Z87440 Personal history of urinary (tract) infections: Secondary | ICD-10-CM | POA: Diagnosis not present

## 2023-01-12 DIAGNOSIS — N390 Urinary tract infection, site not specified: Secondary | ICD-10-CM

## 2023-01-12 DIAGNOSIS — N200 Calculus of kidney: Secondary | ICD-10-CM

## 2023-01-12 DIAGNOSIS — Z09 Encounter for follow-up examination after completed treatment for conditions other than malignant neoplasm: Secondary | ICD-10-CM

## 2023-01-12 DIAGNOSIS — Z87442 Personal history of urinary calculi: Secondary | ICD-10-CM | POA: Diagnosis not present

## 2023-01-12 NOTE — Progress Notes (Signed)
01/12/2023 10:15 AM   Sharon Ware [birth year] [id number]  Reason for visit: Follow up right renal stone, recurrent UTI  HPI: Very comorbid 72 year old female with recurrent infections as well as a 1 cm right renal stone with intermittent right-sided flank pain, suspected to be uric acid based on density and urine pH.  She underwent uncomplicated right ureteroscopy, laser lithotripsy, stent placement on 12/10/2022, she removed her stent at home a few days later.  She has been doing very well since that time.  She was seen for possible UTI by Sam on 12/24/2022 but culture was negative, she was started on Hip-Rex as well for UTI prevention.  She denies any flank pain or urinary symptoms at this time.  We discussed general stone prevention strategies including adequate hydration with goal of producing 2.5 L of urine daily, increasing citric acid intake, increasing calcium intake during high oxalate meals, minimizing animal protein, and decreasing salt intake. Information about dietary recommendations given today.  She enjoys Crystal light lemonade and will continue that for citrate supplementation with her history of uric acid stones.  She would like to avoid potassium citrate.  We discussed topical estrogen cream for UTI prevention, but she defers at this time.  She is comfortable continuing Hiprex.    RTC 6 months KUB prior If recurrent infections would again strongly recommend topical estrogen cream  Sondra Come, MD  [facility] [address], [address] [place], [place] [address] [phone]

## 2023-01-13 ENCOUNTER — Encounter: Payer: Medicare Other | Admitting: Occupational Therapy

## 2023-01-14 ENCOUNTER — Ambulatory Visit: Payer: Medicare Other | Admitting: Occupational Therapy

## 2023-01-14 ENCOUNTER — Encounter: Payer: Self-pay | Admitting: Occupational Therapy

## 2023-01-14 DIAGNOSIS — I890 Lymphedema, not elsewhere classified: Secondary | ICD-10-CM | POA: Diagnosis not present

## 2023-01-14 LAB — HM DIABETES EYE EXAM

## 2023-01-14 NOTE — Therapy (Signed)
OUTPATIENT OCCUPATIONAL THERAPY TREATMENT NOTE  BILATERAL LOWER EXTREMITY LYMPHEDEMA  Patient Name: Sharon Ware MRN: [id number] DOB:Jul 13, 1950, 72 y.o., female Today's Date: 01/14/2023   END OF SESSION:   OT End of Session - 01/14/23 1005     Visit Number 23    Number of Visits 36    Date for OT Re-Evaluation 03/24/23    OT Start Time 1005    OT Stop Time 1100    OT Time Calculation (min) 55 min    Activity Tolerance Patient tolerated treatment well;No increased pain    Behavior During Therapy WFL for tasks assessed/performed               Past Medical History:  Diagnosis Date   Aortic atherosclerosis (HCC)    Arthritis of both knees    Breast cancer (HCC)    Cataract    Genitourinary syndrome of menopause    Hiatal hernia    History of kidney stones    Hyperlipidemia    Hypertension    Long term current use of aromatase inhibitor    a.) letrozole   Long term current use of aspirin    Lymphedema    Malignant neoplasm of upper-inner quadrant of left breast in female, estrogen receptor positive (HCC)    Morbid obesity with BMI of 60.0-69.9, adult (HCC)    Pneumonia    T2DM (type 2 diabetes mellitus) (HCC)    Thoracic ascending aortic aneurysm (HCC) 09/25/2022   a.) CT chest 09/25/2022: 4.2 cm   Umbilical hernia    Urinary tract infection due to ESBL Klebsiella    Vitamin D deficiency    Past Surgical History:  Procedure Laterality Date   ABDOMINAL HYSTERECTOMY     x2   BREAST BIOPSY Right 1990s   neg   BREAST BIOPSY Right 2000s   neg   BREAST BIOPSY Left 11/24/2020   u/s bx 10:30/8 cmfn-"heart" marker   BREAST EXCISIONAL BIOPSY Right    neg   BREAST LUMPECTOMY     BREAST LUMPECTOMY WITH RADIOFREQUENCY TAG IDENTIFICATION Left 12/10/2020   Procedure: BREAST LUMPECTOMY WITH RADIOFREQUENCY TAG IDENTIFICATION;  Surgeon: Campbell Lerner, MD;  Location: [facility] ORS;  Service: General;  Laterality: Left;   COLONOSCOPY     CYSTOSCOPY/URETEROSCOPY/HOLMIUM  LASER/STENT PLACEMENT Right 12/10/2022   Procedure: CYSTOSCOPY/URETEROSCOPY/HOLMIUM LASER/STENT PLACEMENT;  Surgeon: Sondra Come, MD;  Location: [facility] ORS;  Service: Urology;  Laterality: Right;   DILATION AND CURETTAGE OF UTERUS     EYE SURGERY  2004   cataract surgery and laser surgery   Salpingo-oophorectomy     Patient Active Problem List   Diagnosis Date Noted   Kidney stones 12/08/2022   Lymphedema 09/25/2022   Hypoxia 09/25/2022   Edema 01/30/2021   Malignant neoplasm of upper-inner quadrant of left breast in female, estrogen receptor positive (HCC) 12/01/2020   Goals of care, counseling/discussion 12/01/2020   Morbid obesity (HCC) 08/08/2018   BMI 60.0-69.9, adult (HCC) 08/08/2018   Controlled diabetes mellitus type 2 with complications (HCC) 08/08/2018   Hyperlipidemia 08/08/2018   Hypertension associated with diabetes (HCC) 08/08/2018   Arthritis of both knees 08/08/2018   Vitamin D deficiency 08/08/2018   PCP: Olevia Perches, DO  REFERRING PROVIDER: Gwynn Burly, DO  REFERRING DIAG: I89.0  THERAPY DIAG:  Lymphedema, not elsewhere classified [I89.0]  Rationale for Evaluation and Treatment: Rehabilitation  ONSET DATE: 1 yr ago without known precipitating event- by report  SUBJECTIVE:  SUBJECTIVE STATEMENT: Sharon Ware presents for OT treatment to address BLE lymphedema, L>R. Pt is accompanied by her spouse, [name], who assists her with transfers and with compression bandaging. Pt denies LE-related leg pain. She arrives seated in a transport wheelchair with compression wraps in place on her R leg , and custom compression stocking in place on her L LEG.  Tobacco Use: Low Risk  (01/14/2023)   Patient History    Smoking Tobacco Use: Never    Smokeless Tobacco Use: Never    Passive  Exposure: Never   PERTINENT HISTORY: HTN, Obesity w BMI 60-69.9, DM, B knee arthritis, Hx L breast cancer, recurrent L leg cellulitis. MRSA test results pending  PAIN: Are you having pain? Denies LE related pain. Endorses OA knee pain and kidney stone -related pain  PRECAUTIONS: Fall and Other: lymphedema precautions, DM. Hx L Br Ca  PRIOR LEVEL OF FUNCTION: Independent with household mobility with device, Requires assistive device for independence, Needs assistance with homemaking, Needs assistance with gait, and Needs assistance with transfers  PATIENT GOALS: ... Get improvement for my condition...., keep lymphedema from getting worse, and reduce infection risk"  LYMPHEDEMA ASSESSMENTS:  FOTO Functional Outcomes Measure: Initial 33%  Lymphedema Life Impact Scale (LLIS): 22.06%  BLE COMPARATIVE LIMB VOLUMETRICS Initial 09/14/22  LANDMARK RIGHT    R LEG (A-D) 7717.1 ml  R THIGH (E-G) ml  R FULL LIMB (A-G) ml  Limb Volume differential (LVD)  %  Volume change since initial %  Volume change overall V  (Blank rows = not tested)  LANDMARK LEFT   L LEG (A-D) 7752.1 ml  L THIGH (E-G) ml  L FULL LIMB (A-G) ml  Limb Volume differential (LVD)  Leg LVD measures 0.5%, L>R   Volume change since initial %  Volume change overall %   (Blank rows = not tested)    LLE COMPARATIVE LIMB VOLUMETRICS 9th visit 11/05/22  LANDMARK LEFT   L LEG (A-D) 5445.7 ml  L THIGH (E-G) ml  L FULL LIMB (A-G) ml  Limb Volume differential (LVD)    Volume change since initial  L LEG reduced in volume by 29.43% since 09/14/22  Volume change overall %  (Blank rows = not tested)   LLE COMPARATIVE LIMB VOLUMETRICS 16 th visit 12/08/22  LANDMARK LEFT   L LEG (A-D) 5445.7 ml  L THIGH (E-G) ml  L FULL LIMB (A-G) ml  Limb Volume differential (LVD)    Volume change since initial  L LEG is INCREASED in volume by 2.36% today, which is essentially stable since last measured on 11/05/22  Volume change overall  L LEG  reduced in volume by 29.43% since 09/14/22   R LE COMPARATIVE LIMB VOLUMETRICS   19th visit 12/27/22   [facility] RIGHT    R LEG (A-D) 5951.1 ml  R THIGH (E-G) ml  R FULL LIMB (A-G) ml  Limb Volume differential (LVD)  %  Volume change since initial R LEG volume is reduced  by 23.3% since initially measured on 09/14/22.  Volume change overall V  (Blank rows = not tested)  TODAY'S TREATMENT:  Knee length RLE multilayer compression wraps- as established MLD as established to RLE- simultaneous skin care Pt/ family edu  PATIENT EDUCATION:  Continued Pt/ CG edu for lymphedema self care home program throughout session. Topics include outcome of comparative limb volumetrics- starting limb volume differentials (LVDs), technology and gradient techniques used for short stretch, multilayer compression wrapping, simple self-MLD, therapeutic lymphatic pumping exercises, skin/nail care, LE precautions,. compression garment recommendations and specifications, wear and care schedule and compression garment donning / doffing w assistive devices. Discussed progress towards all OT goals since commencing CDT. All questions answered to the Pt's satisfaction. Good return. Person educated: Patient and Spouse Education method: Explanation, Demonstration, and Handouts Education comprehension: verbalized understanding, returned demonstration, verbal cues required, and needs further education  Lymphedema HOME PROGRAM:Complete Decongestive Therapy (CDT) Self-Management Phase Daily simple self-MLD Daily BLE skin care and inspection Daily lymphatic pumping there ex Appropriate daily compression- knee length, will require max caregiver A to  and doff Leg elevation when seated  ASSESSMENT: CLINICAL IMPRESSION: Pt tolerated RLE MLD with simultaneous skin care as established without  increased pain. Hyperkeratosis is slow to respond to CDT on this side. As leg continues to reduce in volume hyperkeratosis becomes more raised on the surface, but is not sloughing off.  Pt advised to continue with daily skin care regime and not to try to flake skin overgrowth patches off to limit infection risk. Skin is much    better hydrated overall, but some mild dryness persists. Reapplied multilayer wraps using gradient techniques as established. Cont as per POC.  OBJECTIVE IMPAIRMENTS: Abnormal gait, decreased activity tolerance, decreased balance, decreased endurance, decreased knowledge of condition, decreased knowledge of use of DME, decreased mobility, difficulty walking, decreased ROM, decreased strength, increased edema, impaired perceived functional ability, postural dysfunction, obesity, pain, and chronic progressive limb swelling with elevated infection risk and risk of non-healing wounds .   ACTIVITY LIMITATIONS: carrying, lifting, bending, sitting, standing, squatting, sleeping, stairs, transfers, bed mobility, bathing, toileting, dressing, hygiene/grooming, and caring for others  PARTICIPATION LIMITATIONS: meal prep, cleaning, laundry, driving, shopping, community activity, occupation, yard work, and church  PERSONAL FACTORS: 3+ comorbidities: HTN, Stage III Obesity, B knee arthritis pain and inflammation and impaired AROM 2/2 body habitus  are also affecting patient's functional outcome.   REHAB POTENTIAL:  Fair with daily CG assistance throughout Intensive Phase CDT.  Poor without daily CG assistance with all LE self care home program components  GOALS: Goals reviewed with patient? Yes  SHORT TERM GOALS: Target date: 4th OT Rx visit   Pt will demonstrate understanding of lymphedema precautions and prevention strategies with modified independence using a printed reference to identify at least 5 precautions and discussing how s/he may implement them into daily life to reduce risk  of progression with modified assistance ( printed reference). Baseline: modified independence ( printed reference) Goal status: 11/05/22 GOAL MET  2.  Pt will be able to apply multilayer, thigh length, compression wraps using gradient techniques with Max caregiver assistance to decrease limb volume, to limit infection risk, and to limit lymphedema progression.  Baseline: Max A Goal status: 11/05/22 GOAL MET  LONG TERM GOALS: Target date: 12/06/22  Given this patient's Intake score of 22.06 % on the Lymphedema Life Impact Scale (LLIS), patient will experience a reduction of at least 5 points in her perceived level of functional impairment resulting from lymphedema to improve functional performance and quality of life (QOL). Baseline: 22.06 % Goal status ONGOING-PROGRESSING  2.  Given this patient's Intake score  of 33 % on the functional outcomes FOTO tool, patient will experience an increase in function of 3 points to improve basic and instrumental ADLs performance, including lymphedema self-care.  Baseline: 33 % Goal status: ONGOING-PROGRESSING  3.  During Intensive phase CDT Pt will achieve at least 85% compliance with all lymphedema self-care home program components, including  daily skin care, multilayer , gradient compression wraps with daily changes, daily simple self MLD and daily lymphatic pumping therex to achieve optimal clinical outcome and to habituate self care regime for optimal LE self-management over time. Baseline: Dependent Goal status:11/05/22 GOAL MET  4.  Pt will achieve at least a 10% volume reductions bilaterally below the knees to return limb to more typical size and shape, to limit infection risk and LE progression, to decrease pain, to improve function, and to improve body image and QOL. Baseline: Dependent Goal status:11/05/22 GOAL MET for L LEG w 29% reduction to date.   12/29/22 GOAL MET for R LEG w 23.3% reduction achieved to date.  5.  Pt will be able to don and doff  appropriate compression garments and devices using assistive devices and extra time and Max CG assistance within 1 week of issue date for optimal lymphedema self-care. Baseline: Dependent Goal status: 12/27/22 GOAL MET PLAN:  PT FREQUENCY: 2 x/week  PT DURATION: 12  weeks other: and PRN  PLANNED INTERVENTIONS: Therapeutic exercises, Therapeutic activity, Patient/Family education, Self Care, DME instructions, Manual lymph drainage, Compression bandaging, Manual therapy, and skin care during MLD, fit with appropriate custom compression garments and devices, fit with appropriate compression device  which  follows lymphatic pathways and anatomic distribution  PLAN FOR NEXT SESSION:  Continue RLE/RLQ CDT including  RLE multilayer compression bandages RLE MLD w simultaneous skin care  Loel Dubonnet, MS, OTR/L, CLT-LANA 01/14/23 12:20 PM

## 2023-01-17 ENCOUNTER — Ambulatory Visit: Payer: Medicare Other | Admitting: Occupational Therapy

## 2023-01-17 DIAGNOSIS — I890 Lymphedema, not elsewhere classified: Secondary | ICD-10-CM

## 2023-01-17 NOTE — Therapy (Signed)
OUTPATIENT OCCUPATIONAL THERAPY TREATMENT NOTE  BILATERAL LOWER EXTREMITY LYMPHEDEMA  Patient Name: Sharon Ware MRN: [id number] DOB:05/17/1950, 72 y.o., female Today's Date: 01/17/2023   END OF SESSION:   OT End of Session - 01/17/23 1510     Visit Number 24    Number of Visits 36    Date for OT Re-Evaluation 03/24/23    OT Start Time 0305    OT Stop Time 0404    OT Time Calculation (min) 59 min    Activity Tolerance Patient tolerated treatment well;No increased pain    Behavior During Therapy WFL for tasks assessed/performed               Past Medical History:  Diagnosis Date   Aortic atherosclerosis (HCC)    Arthritis of both knees    Breast cancer (HCC)    Cataract    Genitourinary syndrome of menopause    Hiatal hernia    History of kidney stones    Hyperlipidemia    Hypertension    Long term current use of aromatase inhibitor    a.) letrozole   Long term current use of aspirin    Lymphedema    Malignant neoplasm of upper-inner quadrant of left breast in female, estrogen receptor positive (HCC)    Morbid obesity with BMI of 60.0-69.9, adult (HCC)    Pneumonia    T2DM (type 2 diabetes mellitus) (HCC)    Thoracic ascending aortic aneurysm (HCC) 09/25/2022   a.) CT chest 09/25/2022: 4.2 cm   Umbilical hernia    Urinary tract infection due to ESBL Klebsiella    Vitamin D deficiency    Past Surgical History:  Procedure Laterality Date   ABDOMINAL HYSTERECTOMY     x2   BREAST BIOPSY Right 1990s   neg   BREAST BIOPSY Right 2000s   neg   BREAST BIOPSY Left 11/24/2020   u/s bx 10:30/8 cmfn-"heart" marker   BREAST EXCISIONAL BIOPSY Right    neg   BREAST LUMPECTOMY     BREAST LUMPECTOMY WITH RADIOFREQUENCY TAG IDENTIFICATION Left 12/10/2020   Procedure: BREAST LUMPECTOMY WITH RADIOFREQUENCY TAG IDENTIFICATION;  Surgeon: Campbell Lerner, MD;  Location: [facility];  Service: General;  Laterality: Left;   COLONOSCOPY     CYSTOSCOPY/URETEROSCOPY/HOLMIUM  LASER/STENT PLACEMENT Right 12/10/2022   Procedure: CYSTOSCOPY/URETEROSCOPY/HOLMIUM LASER/STENT PLACEMENT;  Surgeon: Sondra Come, MD;  Location: [facility];  Service: Urology;  Laterality: Right;   DILATION AND CURETTAGE OF UTERUS     EYE SURGERY  2004   cataract surgery and laser surgery   Salpingo-oophorectomy     Patient Active Problem List   Diagnosis Date Noted   Kidney stones 12/08/2022   Lymphedema 09/25/2022   Hypoxia 09/25/2022   Edema 01/30/2021   Malignant neoplasm of upper-inner quadrant of left breast in female, estrogen receptor positive (HCC) 12/01/2020   Goals of care, counseling/discussion 12/01/2020   Morbid obesity (HCC) 08/08/2018   BMI 60.0-69.9, adult (HCC) 08/08/2018   Controlled diabetes mellitus type 2 with complications (HCC) 08/08/2018   Hyperlipidemia 08/08/2018   Hypertension associated with diabetes (HCC) 08/08/2018   Arthritis of both knees 08/08/2018   Vitamin D deficiency 08/08/2018   PCP: Olevia Perches, DO  REFERRING PROVIDER: Gwynn Burly, DO  REFERRING DIAG: I89.0  THERAPY DIAG:  Lymphedema, not elsewhere classified [I89.0]  Rationale for Evaluation and Treatment: Rehabilitation  ONSET DATE: 1 yr ago without known precipitating event- by report  SUBJECTIVE:  OUTPATIENT OCCUPATIONAL THERAPY TREATMENT NOTE  BILATERAL LOWER EXTREMITY LYMPHEDEMA  Patient Name: Sharon Ware MRN: [id number] DOB:05/17/1950, 72 y.o., female Today's Date: 01/17/2023   END OF SESSION:   OT End of Session - 01/17/23 1510     Visit Number 24    Number of Visits 36    Date for OT Re-Evaluation 03/24/23    OT Start Time 0305    OT Stop Time 0404    OT Time Calculation (min) 59 min    Activity Tolerance Patient tolerated treatment well;No increased pain    Behavior During Therapy WFL for tasks assessed/performed               Past Medical History:  Diagnosis Date   Aortic atherosclerosis (HCC)    Arthritis of both knees    Breast cancer (HCC)    Cataract    Genitourinary syndrome of menopause    Hiatal hernia    History of kidney stones    Hyperlipidemia    Hypertension    Long term current use of aromatase inhibitor    a.) letrozole   Long term current use of aspirin    Lymphedema    Malignant neoplasm of upper-inner quadrant of left breast in female, estrogen receptor positive (HCC)    Morbid obesity with BMI of 60.0-69.9, adult (HCC)    Pneumonia    T2DM (type 2 diabetes mellitus) (HCC)    Thoracic ascending aortic aneurysm (HCC) 09/25/2022   a.) CT chest 09/25/2022: 4.2 cm   Umbilical hernia    Urinary tract infection due to ESBL Klebsiella    Vitamin D deficiency    Past Surgical History:  Procedure Laterality Date   ABDOMINAL HYSTERECTOMY     x2   BREAST BIOPSY Right 1990s   neg   BREAST BIOPSY Right 2000s   neg   BREAST BIOPSY Left 11/24/2020   u/s bx 10:30/8 cmfn-"heart" marker   BREAST EXCISIONAL BIOPSY Right    neg   BREAST LUMPECTOMY     BREAST LUMPECTOMY WITH RADIOFREQUENCY TAG IDENTIFICATION Left 12/10/2020   Procedure: BREAST LUMPECTOMY WITH RADIOFREQUENCY TAG IDENTIFICATION;  Surgeon: Campbell Lerner, MD;  Location: [facility];  Service: General;  Laterality: Left;   COLONOSCOPY     CYSTOSCOPY/URETEROSCOPY/HOLMIUM  LASER/STENT PLACEMENT Right 12/10/2022   Procedure: CYSTOSCOPY/URETEROSCOPY/HOLMIUM LASER/STENT PLACEMENT;  Surgeon: Sondra Come, MD;  Location: [facility];  Service: Urology;  Laterality: Right;   DILATION AND CURETTAGE OF UTERUS     EYE SURGERY  2004   cataract surgery and laser surgery   Salpingo-oophorectomy     Patient Active Problem List   Diagnosis Date Noted   Kidney stones 12/08/2022   Lymphedema 09/25/2022   Hypoxia 09/25/2022   Edema 01/30/2021   Malignant neoplasm of upper-inner quadrant of left breast in female, estrogen receptor positive (HCC) 12/01/2020   Goals of care, counseling/discussion 12/01/2020   Morbid obesity (HCC) 08/08/2018   BMI 60.0-69.9, adult (HCC) 08/08/2018   Controlled diabetes mellitus type 2 with complications (HCC) 08/08/2018   Hyperlipidemia 08/08/2018   Hypertension associated with diabetes (HCC) 08/08/2018   Arthritis of both knees 08/08/2018   Vitamin D deficiency 08/08/2018   PCP: Olevia Perches, DO  REFERRING PROVIDER: Gwynn Burly, DO  REFERRING DIAG: I89.0  THERAPY DIAG:  Lymphedema, not elsewhere classified [I89.0]  Rationale for Evaluation and Treatment: Rehabilitation  ONSET DATE: 1 yr ago without known precipitating event- by report  SUBJECTIVE:  OUTPATIENT OCCUPATIONAL THERAPY TREATMENT NOTE  BILATERAL LOWER EXTREMITY LYMPHEDEMA  Patient Name: Sharon Ware MRN: [id number] DOB:05/17/1950, 72 y.o., female Today's Date: 01/17/2023   END OF SESSION:   OT End of Session - 01/17/23 1510     Visit Number 24    Number of Visits 36    Date for OT Re-Evaluation 03/24/23    OT Start Time 0305    OT Stop Time 0404    OT Time Calculation (min) 59 min    Activity Tolerance Patient tolerated treatment well;No increased pain    Behavior During Therapy WFL for tasks assessed/performed               Past Medical History:  Diagnosis Date   Aortic atherosclerosis (HCC)    Arthritis of both knees    Breast cancer (HCC)    Cataract    Genitourinary syndrome of menopause    Hiatal hernia    History of kidney stones    Hyperlipidemia    Hypertension    Long term current use of aromatase inhibitor    a.) letrozole   Long term current use of aspirin    Lymphedema    Malignant neoplasm of upper-inner quadrant of left breast in female, estrogen receptor positive (HCC)    Morbid obesity with BMI of 60.0-69.9, adult (HCC)    Pneumonia    T2DM (type 2 diabetes mellitus) (HCC)    Thoracic ascending aortic aneurysm (HCC) 09/25/2022   a.) CT chest 09/25/2022: 4.2 cm   Umbilical hernia    Urinary tract infection due to ESBL Klebsiella    Vitamin D deficiency    Past Surgical History:  Procedure Laterality Date   ABDOMINAL HYSTERECTOMY     x2   BREAST BIOPSY Right 1990s   neg   BREAST BIOPSY Right 2000s   neg   BREAST BIOPSY Left 11/24/2020   u/s bx 10:30/8 cmfn-"heart" marker   BREAST EXCISIONAL BIOPSY Right    neg   BREAST LUMPECTOMY     BREAST LUMPECTOMY WITH RADIOFREQUENCY TAG IDENTIFICATION Left 12/10/2020   Procedure: BREAST LUMPECTOMY WITH RADIOFREQUENCY TAG IDENTIFICATION;  Surgeon: Campbell Lerner, MD;  Location: [facility];  Service: General;  Laterality: Left;   COLONOSCOPY     CYSTOSCOPY/URETEROSCOPY/HOLMIUM  LASER/STENT PLACEMENT Right 12/10/2022   Procedure: CYSTOSCOPY/URETEROSCOPY/HOLMIUM LASER/STENT PLACEMENT;  Surgeon: Sondra Come, MD;  Location: [facility];  Service: Urology;  Laterality: Right;   DILATION AND CURETTAGE OF UTERUS     EYE SURGERY  2004   cataract surgery and laser surgery   Salpingo-oophorectomy     Patient Active Problem List   Diagnosis Date Noted   Kidney stones 12/08/2022   Lymphedema 09/25/2022   Hypoxia 09/25/2022   Edema 01/30/2021   Malignant neoplasm of upper-inner quadrant of left breast in female, estrogen receptor positive (HCC) 12/01/2020   Goals of care, counseling/discussion 12/01/2020   Morbid obesity (HCC) 08/08/2018   BMI 60.0-69.9, adult (HCC) 08/08/2018   Controlled diabetes mellitus type 2 with complications (HCC) 08/08/2018   Hyperlipidemia 08/08/2018   Hypertension associated with diabetes (HCC) 08/08/2018   Arthritis of both knees 08/08/2018   Vitamin D deficiency 08/08/2018   PCP: Olevia Perches, DO  REFERRING PROVIDER: Gwynn Burly, DO  REFERRING DIAG: I89.0  THERAPY DIAG:  Lymphedema, not elsewhere classified [I89.0]  Rationale for Evaluation and Treatment: Rehabilitation  ONSET DATE: 1 yr ago without known precipitating event- by report  SUBJECTIVE:  of 33 % on the functional outcomes FOTO tool, patient will experience an increase in function of 3 points to improve basic and instrumental ADLs performance, including lymphedema self-care.  Baseline: 33 % Goal status: ONGOING-PROGRESSING  3.  During Intensive phase CDT Pt will achieve at least 85% compliance with all lymphedema self-care home program components, including  daily skin care, multilayer , gradient compression wraps with daily changes, daily simple self MLD and daily lymphatic pumping therex to achieve optimal clinical outcome and to habituate self care regime for optimal LE self-management over time. Baseline: Dependent Goal status:11/05/22 GOAL MET  4.  Pt will achieve at least a 10% volume reductions bilaterally below the knees to return limb to more typical size and shape, to limit infection risk and LE progression, to decrease pain, to improve function, and to improve body image and QOL. Baseline: Dependent Goal status:11/05/22 GOAL MET for L LEG w 29% reduction to date.   12/29/22 GOAL MET for R LEG w 23.3% reduction achieved to date.  5.  Pt will be able to don and doff  appropriate compression garments and devices using assistive devices and extra time and Max CG assistance within 1 week of issue date for optimal lymphedema self-care. Baseline: Dependent Goal status: 12/27/22 GOAL MET PLAN:  PT FREQUENCY: 2 x/week  PT DURATION: 12  weeks other: and PRN  PLANNED INTERVENTIONS: Therapeutic exercises, Therapeutic activity, Patient/Family education, Self Care, DME instructions, Manual lymph drainage, Compression bandaging, Manual therapy, and skin care during MLD, fit with appropriate custom compression garments and devices, fit with appropriate compression device  which  follows lymphatic pathways and anatomic distribution  PLAN FOR NEXT SESSION:  Continue RLE/RLQ CDT including  RLE multilayer compression bandages RLE MLD w simultaneous skin care  Loel Dubonnet, MS, OTR/L, CLT-LANA 01/17/23 4:08 PM  OUTPATIENT OCCUPATIONAL THERAPY TREATMENT NOTE  BILATERAL LOWER EXTREMITY LYMPHEDEMA  Patient Name: Sharon Ware MRN: [id number] DOB:1950-05-30, 72 y.o., female Today's Date: 01/17/2023   END OF SESSION:   OT End of Session - 01/17/23 1510     Visit Number 24    Number of Visits 36    Date for OT Re-Evaluation 03/24/23    OT Start Time 0305    OT Stop Time 0404    OT Time Calculation (min) 59 min    Activity Tolerance Patient tolerated treatment well;No increased pain    Behavior During Therapy WFL for tasks assessed/performed               Past Medical History:  Diagnosis Date   Aortic atherosclerosis (HCC)    Arthritis of both knees    Breast cancer (HCC)    Cataract    Genitourinary syndrome of menopause    Hiatal hernia    History of kidney stones    Hyperlipidemia    Hypertension    Long term current use of aromatase inhibitor    a.) letrozole   Long term current use of aspirin    Lymphedema    Malignant neoplasm of upper-inner quadrant of left breast in female, estrogen receptor positive (HCC)    Morbid obesity with  BMI of 60.0-69.9, adult (HCC)    Pneumonia    T2DM (type 2 diabetes mellitus) (HCC)    Thoracic ascending aortic aneurysm (HCC) 09/25/2022   a.) CT chest 09/25/2022: 4.2 cm   Umbilical hernia    Urinary tract infection due to ESBL Klebsiella    Vitamin D deficiency    Past Surgical History:  Procedure  OUTPATIENT OCCUPATIONAL THERAPY TREATMENT NOTE  BILATERAL LOWER EXTREMITY LYMPHEDEMA  Patient Name: Sharon Ware MRN: [id number] DOB:05/17/1950, 72 y.o., female Today's Date: 01/17/2023   END OF SESSION:   OT End of Session - 01/17/23 1510     Visit Number 24    Number of Visits 36    Date for OT Re-Evaluation 03/24/23    OT Start Time 0305    OT Stop Time 0404    OT Time Calculation (min) 59 min    Activity Tolerance Patient tolerated treatment well;No increased pain    Behavior During Therapy WFL for tasks assessed/performed               Past Medical History:  Diagnosis Date   Aortic atherosclerosis (HCC)    Arthritis of both knees    Breast cancer (HCC)    Cataract    Genitourinary syndrome of menopause    Hiatal hernia    History of kidney stones    Hyperlipidemia    Hypertension    Long term current use of aromatase inhibitor    a.) letrozole   Long term current use of aspirin    Lymphedema    Malignant neoplasm of upper-inner quadrant of left breast in female, estrogen receptor positive (HCC)    Morbid obesity with BMI of 60.0-69.9, adult (HCC)    Pneumonia    T2DM (type 2 diabetes mellitus) (HCC)    Thoracic ascending aortic aneurysm (HCC) 09/25/2022   a.) CT chest 09/25/2022: 4.2 cm   Umbilical hernia    Urinary tract infection due to ESBL Klebsiella    Vitamin D deficiency    Past Surgical History:  Procedure Laterality Date   ABDOMINAL HYSTERECTOMY     x2   BREAST BIOPSY Right 1990s   neg   BREAST BIOPSY Right 2000s   neg   BREAST BIOPSY Left 11/24/2020   u/s bx 10:30/8 cmfn-"heart" marker   BREAST EXCISIONAL BIOPSY Right    neg   BREAST LUMPECTOMY     BREAST LUMPECTOMY WITH RADIOFREQUENCY TAG IDENTIFICATION Left 12/10/2020   Procedure: BREAST LUMPECTOMY WITH RADIOFREQUENCY TAG IDENTIFICATION;  Surgeon: Campbell Lerner, MD;  Location: [facility];  Service: General;  Laterality: Left;   COLONOSCOPY     CYSTOSCOPY/URETEROSCOPY/HOLMIUM  LASER/STENT PLACEMENT Right 12/10/2022   Procedure: CYSTOSCOPY/URETEROSCOPY/HOLMIUM LASER/STENT PLACEMENT;  Surgeon: Sondra Come, MD;  Location: [facility];  Service: Urology;  Laterality: Right;   DILATION AND CURETTAGE OF UTERUS     EYE SURGERY  2004   cataract surgery and laser surgery   Salpingo-oophorectomy     Patient Active Problem List   Diagnosis Date Noted   Kidney stones 12/08/2022   Lymphedema 09/25/2022   Hypoxia 09/25/2022   Edema 01/30/2021   Malignant neoplasm of upper-inner quadrant of left breast in female, estrogen receptor positive (HCC) 12/01/2020   Goals of care, counseling/discussion 12/01/2020   Morbid obesity (HCC) 08/08/2018   BMI 60.0-69.9, adult (HCC) 08/08/2018   Controlled diabetes mellitus type 2 with complications (HCC) 08/08/2018   Hyperlipidemia 08/08/2018   Hypertension associated with diabetes (HCC) 08/08/2018   Arthritis of both knees 08/08/2018   Vitamin D deficiency 08/08/2018   PCP: Olevia Perches, DO  REFERRING PROVIDER: Gwynn Burly, DO  REFERRING DIAG: I89.0  THERAPY DIAG:  Lymphedema, not elsewhere classified [I89.0]  Rationale for Evaluation and Treatment: Rehabilitation  ONSET DATE: 1 yr ago without known precipitating event- by report  SUBJECTIVE:  OUTPATIENT OCCUPATIONAL THERAPY TREATMENT NOTE  BILATERAL LOWER EXTREMITY LYMPHEDEMA  Patient Name: Sharon Ware MRN: [id number] DOB:05/17/1950, 72 y.o., female Today's Date: 01/17/2023   END OF SESSION:   OT End of Session - 01/17/23 1510     Visit Number 24    Number of Visits 36    Date for OT Re-Evaluation 03/24/23    OT Start Time 0305    OT Stop Time 0404    OT Time Calculation (min) 59 min    Activity Tolerance Patient tolerated treatment well;No increased pain    Behavior During Therapy WFL for tasks assessed/performed               Past Medical History:  Diagnosis Date   Aortic atherosclerosis (HCC)    Arthritis of both knees    Breast cancer (HCC)    Cataract    Genitourinary syndrome of menopause    Hiatal hernia    History of kidney stones    Hyperlipidemia    Hypertension    Long term current use of aromatase inhibitor    a.) letrozole   Long term current use of aspirin    Lymphedema    Malignant neoplasm of upper-inner quadrant of left breast in female, estrogen receptor positive (HCC)    Morbid obesity with BMI of 60.0-69.9, adult (HCC)    Pneumonia    T2DM (type 2 diabetes mellitus) (HCC)    Thoracic ascending aortic aneurysm (HCC) 09/25/2022   a.) CT chest 09/25/2022: 4.2 cm   Umbilical hernia    Urinary tract infection due to ESBL Klebsiella    Vitamin D deficiency    Past Surgical History:  Procedure Laterality Date   ABDOMINAL HYSTERECTOMY     x2   BREAST BIOPSY Right 1990s   neg   BREAST BIOPSY Right 2000s   neg   BREAST BIOPSY Left 11/24/2020   u/s bx 10:30/8 cmfn-"heart" marker   BREAST EXCISIONAL BIOPSY Right    neg   BREAST LUMPECTOMY     BREAST LUMPECTOMY WITH RADIOFREQUENCY TAG IDENTIFICATION Left 12/10/2020   Procedure: BREAST LUMPECTOMY WITH RADIOFREQUENCY TAG IDENTIFICATION;  Surgeon: Campbell Lerner, MD;  Location: [facility];  Service: General;  Laterality: Left;   COLONOSCOPY     CYSTOSCOPY/URETEROSCOPY/HOLMIUM  LASER/STENT PLACEMENT Right 12/10/2022   Procedure: CYSTOSCOPY/URETEROSCOPY/HOLMIUM LASER/STENT PLACEMENT;  Surgeon: Sondra Come, MD;  Location: [facility];  Service: Urology;  Laterality: Right;   DILATION AND CURETTAGE OF UTERUS     EYE SURGERY  2004   cataract surgery and laser surgery   Salpingo-oophorectomy     Patient Active Problem List   Diagnosis Date Noted   Kidney stones 12/08/2022   Lymphedema 09/25/2022   Hypoxia 09/25/2022   Edema 01/30/2021   Malignant neoplasm of upper-inner quadrant of left breast in female, estrogen receptor positive (HCC) 12/01/2020   Goals of care, counseling/discussion 12/01/2020   Morbid obesity (HCC) 08/08/2018   BMI 60.0-69.9, adult (HCC) 08/08/2018   Controlled diabetes mellitus type 2 with complications (HCC) 08/08/2018   Hyperlipidemia 08/08/2018   Hypertension associated with diabetes (HCC) 08/08/2018   Arthritis of both knees 08/08/2018   Vitamin D deficiency 08/08/2018   PCP: Olevia Perches, DO  REFERRING PROVIDER: Gwynn Burly, DO  REFERRING DIAG: I89.0  THERAPY DIAG:  Lymphedema, not elsewhere classified [I89.0]  Rationale for Evaluation and Treatment: Rehabilitation  ONSET DATE: 1 yr ago without known precipitating event- by report  SUBJECTIVE:  of 33 % on the functional outcomes FOTO tool, patient will experience an increase in function of 3 points to improve basic and instrumental ADLs performance, including lymphedema self-care.  Baseline: 33 % Goal status: ONGOING-PROGRESSING  3.  During Intensive phase CDT Pt will achieve at least 85% compliance with all lymphedema self-care home program components, including  daily skin care, multilayer , gradient compression wraps with daily changes, daily simple self MLD and daily lymphatic pumping therex to achieve optimal clinical outcome and to habituate self care regime for optimal LE self-management over time. Baseline: Dependent Goal status:11/05/22 GOAL MET  4.  Pt will achieve at least a 10% volume reductions bilaterally below the knees to return limb to more typical size and shape, to limit infection risk and LE progression, to decrease pain, to improve function, and to improve body image and QOL. Baseline: Dependent Goal status:11/05/22 GOAL MET for L LEG w 29% reduction to date.   12/29/22 GOAL MET for R LEG w 23.3% reduction achieved to date.  5.  Pt will be able to don and doff  appropriate compression garments and devices using assistive devices and extra time and Max CG assistance within 1 week of issue date for optimal lymphedema self-care. Baseline: Dependent Goal status: 12/27/22 GOAL MET PLAN:  PT FREQUENCY: 2 x/week  PT DURATION: 12  weeks other: and PRN  PLANNED INTERVENTIONS: Therapeutic exercises, Therapeutic activity, Patient/Family education, Self Care, DME instructions, Manual lymph drainage, Compression bandaging, Manual therapy, and skin care during MLD, fit with appropriate custom compression garments and devices, fit with appropriate compression device  which  follows lymphatic pathways and anatomic distribution  PLAN FOR NEXT SESSION:  Continue RLE/RLQ CDT including  RLE multilayer compression bandages RLE MLD w simultaneous skin care  Loel Dubonnet, MS, OTR/L, CLT-LANA 01/17/23 4:08 PM  OUTPATIENT OCCUPATIONAL THERAPY TREATMENT NOTE  BILATERAL LOWER EXTREMITY LYMPHEDEMA  Patient Name: Sharon Ware MRN: [id number] DOB:1950-05-30, 72 y.o., female Today's Date: 01/17/2023   END OF SESSION:   OT End of Session - 01/17/23 1510     Visit Number 24    Number of Visits 36    Date for OT Re-Evaluation 03/24/23    OT Start Time 0305    OT Stop Time 0404    OT Time Calculation (min) 59 min    Activity Tolerance Patient tolerated treatment well;No increased pain    Behavior During Therapy WFL for tasks assessed/performed               Past Medical History:  Diagnosis Date   Aortic atherosclerosis (HCC)    Arthritis of both knees    Breast cancer (HCC)    Cataract    Genitourinary syndrome of menopause    Hiatal hernia    History of kidney stones    Hyperlipidemia    Hypertension    Long term current use of aromatase inhibitor    a.) letrozole   Long term current use of aspirin    Lymphedema    Malignant neoplasm of upper-inner quadrant of left breast in female, estrogen receptor positive (HCC)    Morbid obesity with  BMI of 60.0-69.9, adult (HCC)    Pneumonia    T2DM (type 2 diabetes mellitus) (HCC)    Thoracic ascending aortic aneurysm (HCC) 09/25/2022   a.) CT chest 09/25/2022: 4.2 cm   Umbilical hernia    Urinary tract infection due to ESBL Klebsiella    Vitamin D deficiency    Past Surgical History:  Procedure  OUTPATIENT OCCUPATIONAL THERAPY TREATMENT NOTE  BILATERAL LOWER EXTREMITY LYMPHEDEMA  Patient Name: Sharon Ware MRN: [id number] DOB:05/17/1950, 72 y.o., female Today's Date: 01/17/2023   END OF SESSION:   OT End of Session - 01/17/23 1510     Visit Number 24    Number of Visits 36    Date for OT Re-Evaluation 03/24/23    OT Start Time 0305    OT Stop Time 0404    OT Time Calculation (min) 59 min    Activity Tolerance Patient tolerated treatment well;No increased pain    Behavior During Therapy WFL for tasks assessed/performed               Past Medical History:  Diagnosis Date   Aortic atherosclerosis (HCC)    Arthritis of both knees    Breast cancer (HCC)    Cataract    Genitourinary syndrome of menopause    Hiatal hernia    History of kidney stones    Hyperlipidemia    Hypertension    Long term current use of aromatase inhibitor    a.) letrozole   Long term current use of aspirin    Lymphedema    Malignant neoplasm of upper-inner quadrant of left breast in female, estrogen receptor positive (HCC)    Morbid obesity with BMI of 60.0-69.9, adult (HCC)    Pneumonia    T2DM (type 2 diabetes mellitus) (HCC)    Thoracic ascending aortic aneurysm (HCC) 09/25/2022   a.) CT chest 09/25/2022: 4.2 cm   Umbilical hernia    Urinary tract infection due to ESBL Klebsiella    Vitamin D deficiency    Past Surgical History:  Procedure Laterality Date   ABDOMINAL HYSTERECTOMY     x2   BREAST BIOPSY Right 1990s   neg   BREAST BIOPSY Right 2000s   neg   BREAST BIOPSY Left 11/24/2020   u/s bx 10:30/8 cmfn-"heart" marker   BREAST EXCISIONAL BIOPSY Right    neg   BREAST LUMPECTOMY     BREAST LUMPECTOMY WITH RADIOFREQUENCY TAG IDENTIFICATION Left 12/10/2020   Procedure: BREAST LUMPECTOMY WITH RADIOFREQUENCY TAG IDENTIFICATION;  Surgeon: Campbell Lerner, MD;  Location: [facility];  Service: General;  Laterality: Left;   COLONOSCOPY     CYSTOSCOPY/URETEROSCOPY/HOLMIUM  LASER/STENT PLACEMENT Right 12/10/2022   Procedure: CYSTOSCOPY/URETEROSCOPY/HOLMIUM LASER/STENT PLACEMENT;  Surgeon: Sondra Come, MD;  Location: [facility] ORS;  Service: Urology;  Laterality: Right;   DILATION AND CURETTAGE OF UTERUS     EYE SURGERY  2004   cataract surgery and laser surgery   Salpingo-oophorectomy     Patient Active Problem List   Diagnosis Date Noted   Kidney stones 12/08/2022   Lymphedema 09/25/2022   Hypoxia 09/25/2022   Edema 01/30/2021   Malignant neoplasm of upper-inner quadrant of left breast in female, estrogen receptor positive (HCC) 12/01/2020   Goals of care, counseling/discussion 12/01/2020   Morbid obesity (HCC) 08/08/2018   BMI 60.0-69.9, adult (HCC) 08/08/2018   Controlled diabetes mellitus type 2 with complications (HCC) 08/08/2018   Hyperlipidemia 08/08/2018   Hypertension associated with diabetes (HCC) 08/08/2018   Arthritis of both knees 08/08/2018   Vitamin D deficiency 08/08/2018   PCP: Olevia Perches, DO  REFERRING PROVIDER: Gwynn Burly, DO  REFERRING DIAG: I89.0  THERAPY DIAG:  Lymphedema, not elsewhere classified [I89.0]  Rationale for Evaluation and Treatment: Rehabilitation  ONSET DATE: 1 yr ago without known precipitating event- by report  SUBJECTIVE:

## 2023-01-18 ENCOUNTER — Inpatient Hospital Stay: Payer: Medicare Other | Attending: Oncology | Admitting: Oncology

## 2023-01-18 ENCOUNTER — Encounter: Payer: Medicare Other | Admitting: Occupational Therapy

## 2023-01-18 ENCOUNTER — Other Ambulatory Visit: Payer: Self-pay

## 2023-01-18 ENCOUNTER — Encounter: Payer: Self-pay | Admitting: Oncology

## 2023-01-18 VITALS — BP 127/87 | HR 90 | Temp 99.0°F | Resp 22 | Ht 63.0 in | Wt 331.2 lb

## 2023-01-18 DIAGNOSIS — Z79811 Long term (current) use of aromatase inhibitors: Secondary | ICD-10-CM | POA: Insufficient documentation

## 2023-01-18 DIAGNOSIS — C50212 Malignant neoplasm of upper-inner quadrant of left female breast: Secondary | ICD-10-CM | POA: Diagnosis present

## 2023-01-18 DIAGNOSIS — Z170 Estrogen receptor positive status [ER+]: Secondary | ICD-10-CM | POA: Diagnosis not present

## 2023-01-18 NOTE — Progress Notes (Signed)
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  Ultrasound showed a 1.2 x 0.8 x 0.7 cm mass 8 cm from the nipple at the 10:30 position.  No suspicious left axillary adenopathy.  This was biopsied and was consistent with invasive mammary carcinoma with tubular features grade 1.  No lymphovascular invasion identified.  ER/PR greater than 90% positive HER2 negative   Final pathology from lumpectomy showed tubular carcinoma grade 10 mm.  Multifocal superior margin positive.  Mutual decision between surgery radiation oncology and patient was to forego reexcision and proceed with adjuvant radiation treatment with radiation boost to the superior margin.   Patient completed adjuvant radiation treatment and started letrozole in November 2022.  Baseline bone density scan normal    Interval history-she has been having some recurrent episodes of ESBL related UTI.  States that her symptoms are better after her renal stones were taken care of.  She is not using any vaginal estrogen cream  presently.  Tolerating letrozole well without any significant side effects  ECOG PS- 3 Pain scale- 4   Review of systems- Review of Systems  Constitutional:  Negative for chills, fever, malaise/fatigue and weight loss.  HENT:  Negative for congestion, ear discharge and nosebleeds.   Eyes:  Negative for blurred vision.  Respiratory:  Negative for cough, hemoptysis, sputum production, shortness of breath and wheezing.   Cardiovascular:  Positive for leg swelling. Negative for chest pain, palpitations, orthopnea and claudication.  Gastrointestinal:  Negative for abdominal pain, blood in stool, constipation, diarrhea, heartburn, melena, nausea and vomiting.  Genitourinary:  Negative for dysuria, flank pain, frequency, hematuria and urgency.  Musculoskeletal:  Negative for back pain, joint pain and myalgias.  Skin:  Negative for rash.  Neurological:  Negative for dizziness, tingling, focal weakness, seizures, weakness and headaches.  Endo/Heme/Allergies:  Does not bruise/bleed easily.  Psychiatric/Behavioral:  Negative for depression and suicidal ideas. The patient does not have insomnia.       Allergies  Allergen Reactions   Other Anaphylaxis    Honey Dew Melon   Wound Dressing Adhesive Rash   Ciprofloxacin     Back spasms   Morphine And Codeine Other (See Comments)    Hypotension   Latex Rash     Past Medical History:  Diagnosis Date   Aortic atherosclerosis (HCC)    Arthritis of both knees    Breast cancer (HCC)    Cataract    Genitourinary syndrome of menopause    Hiatal hernia    History of kidney stones    Hyperlipidemia    Hypertension    Long term current use of aromatase inhibitor    a.) letrozole   Long  term current use of aspirin    Lymphedema    Malignant neoplasm of upper-inner quadrant of left breast in female, estrogen receptor positive (HCC)    Morbid obesity with BMI of 60.0-69.9, adult (HCC)    Pneumonia    T2DM (type 2 diabetes mellitus) (HCC)     Thoracic ascending aortic aneurysm (HCC) 09/25/2022   a.) CT chest 09/25/2022: 4.2 cm   Umbilical hernia    Urinary tract infection due to ESBL Klebsiella    Vitamin D deficiency      Past Surgical History:  Procedure Laterality Date   ABDOMINAL HYSTERECTOMY     x2   BREAST BIOPSY Right 1990s   neg   BREAST BIOPSY Right 2000s   neg   BREAST BIOPSY Left 11/24/2020   u/s bx 10:30/8 cmfn-"heart" marker   BREAST EXCISIONAL BIOPSY Right    neg   BREAST LUMPECTOMY     BREAST LUMPECTOMY WITH RADIOFREQUENCY TAG IDENTIFICATION Left 12/10/2020   Procedure: BREAST LUMPECTOMY WITH RADIOFREQUENCY TAG IDENTIFICATION;  Surgeon: Campbell Lerner, MD;  Location: [facility] ORS;  Service: General;  Laterality: Left;   COLONOSCOPY     CYSTOSCOPY/URETEROSCOPY/HOLMIUM LASER/STENT PLACEMENT Right 12/10/2022   Procedure: CYSTOSCOPY/URETEROSCOPY/HOLMIUM LASER/STENT PLACEMENT;  Surgeon: Sondra Come, MD;  Location: [facility] ORS;  Service: Urology;  Laterality: Right;   DILATION AND CURETTAGE OF UTERUS     EYE SURGERY  2004   cataract surgery and laser surgery   Salpingo-oophorectomy      Social History   Socioeconomic History   Marital status: Married    Spouse name: Fayrene Fearing   Number of children: Not on file   Years of education: Not on file   Highest education level: Not on file  Occupational History   Occupation: retired  Tobacco Use   Smoking status: Never    Passive exposure: Never   Smokeless tobacco: Never  Vaping Use   Vaping status: Never Used  Substance and Sexual Activity   Alcohol use: Not Currently   Drug use: Never   Sexual activity: Not Currently  Other Topics Concern   Not on file  Social History Narrative   Not on file   Social Determinants of Health   Financial Resource Strain: Low Risk  (10/26/2021)   Overall Financial Resource Strain (CARDIA)    Difficulty of Paying Living Expenses: Not hard at all  Food Insecurity: No Food Insecurity (09/30/2022)   Hunger Vital Sign     Worried About Running Out of Food in the Last Year: Never true    Ran Out of Food in the Last Year: Never true  Transportation Needs: No Transportation Needs (09/30/2022)   PRAPARE - Administrator, Civil Service (Medical): No    Lack of Transportation (Non-Medical): No  Physical Activity: Inactive (10/26/2021)   Exercise Vital Sign    Days of Exercise per Week: 0 days    Minutes of Exercise per Session: 0 min  Stress: No Stress Concern Present (10/26/2021)   [employer] of Occupational Health - Occupational Stress Questionnaire    Feeling of Stress : Not at all  Social Connections: Moderately Isolated (10/26/2021)   Social Connection and Isolation Panel [NHANES]    Frequency of Communication with Friends and Family: More than three times a week    Frequency of Social Gatherings with Friends and Family: More than three times a week    Attends Religious Services: Never    Database administrator or Organizations: No    Attends [employer]  or Organization Meetings: Never    Marital Status: Married  Catering manager Violence: Not At Risk (09/25/2022)   Humiliation, Afraid, Rape, and Kick questionnaire    Fear of Current or Ex-Partner: No    Emotionally Abused: No    Physically Abused: No    Sexually Abused: No    Family History  Problem Relation Age of Onset   Breast cancer Mother 58   Heart disease Father    Cancer Paternal Grandfather    Heart disease Paternal Grandfather      Current Outpatient Medications:    acetaminophen (TYLENOL) 650 MG CR tablet, Take 650 mg by mouth every 8 (eight) hours as needed for pain., Disp: , Rfl:    aspirin 81 MG tablet, Take 81 mg by mouth daily., Disp: , Rfl:    Calcium Citrate-Vitamin D (CALCIUM CITRATE + D3 PO), Take 1,200 mg by mouth daily., Disp: , Rfl:    Cholecalciferol (VITAMIN D3) 125 MCG (5000 UT) TABS, Take 5,000 Units by mouth daily., Disp: , Rfl:    CRANBERRY PO, Take 1 capsule by mouth daily at 12 noon., Disp: , Rfl:     letrozole (FEMARA) 2.5 MG tablet, Take 1 tablet (2.5 mg total) by mouth daily., Disp: 90 tablet, Rfl: 2   losartan-hydrochlorothiazide (HYZAAR) 50-12.5 MG tablet, Take 1 tablet by mouth daily., Disp: 90 tablet, Rfl: 1   lovastatin (MEVACOR) 20 MG tablet, TAKE 1 TABLET BY MOUTH EVERYDAY AT BEDTIME, Disp: 90 tablet, Rfl: 1   Menthol, Topical Analgesic, (ICY HOT EX), Apply 1 application topically daily as needed (pain)., Disp: , Rfl:    metFORMIN (GLUCOPHAGE-XR) 500 MG 24 hr tablet, Take 2 tablets (1,000 mg total) by mouth 2 (two) times daily with a meal., Disp: 360 tablet, Rfl: 1   methenamine (HIPREX) 1 g tablet, Take 1 tablet (1 g total) by mouth 2 (two) times daily with a meal., Disp: 60 tablet, Rfl: 11   Omega-3 Fatty Acids (FISH OIL ULTRA) 1400 MG CAPS, Take 1,400 mg by mouth daily., Disp: , Rfl:    Semaglutide (RYBELSUS) 7 MG TABS, Take 1 tablet (7 mg total) by mouth daily., Disp: 90 tablet, Rfl: 0   spironolactone (ALDACTONE) 50 MG tablet, Take 1 tablet (50 mg total) by mouth daily., Disp: 90 tablet, Rfl: 1   UNABLE TO FIND, 3 (three) times a week. Probiotic Vaginal Pearls, Disp: , Rfl:    naloxone (NARCAN) nasal spray 4 mg/0.1 mL, CALL 911. SPR CONTENTS OF ONE SPRAYER (0.1ML) INTO ONE NOSTRIL. REPEAT IN 2-3 MIN IF SYMPTOMS OF OPIOID EMERGENCY PERSIST, ALTERNATE NOSTRILS (Patient not taking: Reported on 01/18/2023), Disp: , Rfl:   Physical exam:  Vitals:   01/18/23 1024 01/18/23 1030  BP: (!) 135/92 127/87  Pulse: 90   Resp: (!) 22   Temp: 99 F (37.2 C)   TempSrc: Tympanic   Weight: (!) 331 lb 3.2 oz (150.2 kg)   Height: 5\' 3"  (1.6 m)    Physical Exam Constitutional:      Appearance: She is obese.  Cardiovascular:     Rate and Rhythm: Normal rate and regular rhythm.     Heart sounds: Normal heart sounds.  Pulmonary:     Effort: Pulmonary effort is normal.     Breath sounds: Normal breath sounds.  Abdominal:     General: Bowel sounds are normal.     Palpations: Abdomen is  soft.  Musculoskeletal:     Comments: Chronic b/l LE lymphedema  Skin:    General:  Skin is warm and dry.  Neurological:     Mental Status: She is alert and oriented to person, place, and time.    Breast exam was performed in seated and lying down position. Patient is status post left lumpectomy with a well-healed surgical scar. No evidence of any palpable masses. No evidence of axillary adenopathy. No evidence of any palpable masses or lumps in the right breast. No evidence of right axillary adenopathy      Latest Ref Rng & Units 12/08/2022    4:43 PM  CMP  Glucose 70 - 99 mg/dL 161   BUN 8 - 27 mg/dL 24   Creatinine 0.96 - 1.00 mg/dL 0.45   Sodium 409 - 811 mmol/L 136   Potassium 3.5 - 5.2 mmol/L 4.3   Chloride 96 - 106 mmol/L 96   CO2 20 - 29 mmol/L 22   Calcium 8.7 - 10.3 mg/dL 9.0   Total Protein 6.0 - 8.5 g/dL 7.1   Total Bilirubin 0.0 - 1.2 mg/dL 0.4   Alkaline Phos 44 - 121 IU/L 64   AST 0 - 40 IU/L 20   ALT 0 - 32 IU/L 23       Latest Ref Rng & Units 12/08/2022    4:43 PM  CBC  WBC 3.4 - 10.8 x10E3/uL 10.4   Hemoglobin 11.1 - 15.9 g/dL 91.4   Hematocrit 78.2 - 46.6 % 44.7   Platelets 150 - 450 x10E3/uL 235     Assessment and plan- Patient is a 72 y.o. female with history of stage I ER/PR positive HER2 negative left breast cancer currently on letrozole.  She is here for a routine follow-up visit  Clinically patient is doing well with no concerning signs and symptoms of recurrence based on today's exam.  She is tolerating letrozole well without any significant side effects.  If she were to have any recurrent UTIs in the future looking at the risks versus benefits would be okay for her to use vaginal estrogen.  I will see her back in 6 months with bone density scan prior.  Her mammogram from August 2024 was unremarkable   Visit Diagnosis 1. Use of letrozole (Femara)   2. Malignant neoplasm of upper-inner quadrant of left female breast, unspecified estrogen receptor  status (HCC)      Dr. Owens Shark, MD, MPH [facility] [phone] 01/18/2023 1:02 PM

## 2023-01-19 ENCOUNTER — Ambulatory Visit: Payer: Medicare Other | Admitting: Oncology

## 2023-01-19 ENCOUNTER — Encounter: Payer: Self-pay | Admitting: Family Medicine

## 2023-01-20 ENCOUNTER — Encounter: Payer: Self-pay | Admitting: Occupational Therapy

## 2023-01-20 ENCOUNTER — Ambulatory Visit: Payer: Medicare Other | Admitting: Occupational Therapy

## 2023-01-20 DIAGNOSIS — I890 Lymphedema, not elsewhere classified: Secondary | ICD-10-CM

## 2023-01-20 NOTE — Therapy (Signed)
achieve at least 85% compliance with all lymphedema self-care home program components, including  daily skin care, multilayer , gradient compression wraps with daily changes, daily simple self MLD and daily lymphatic pumping therex to achieve optimal clinical outcome and to habituate self care regime for optimal LE self-management over time. Baseline: Dependent Goal status:11/05/22 GOAL MET  4.  Pt will achieve at least a 10% volume reductions bilaterally below the knees to return limb to more typical size and shape, to limit infection risk and LE progression, to decrease pain, to improve function, and to improve body image and QOL. Baseline: Dependent Goal status:11/05/22 GOAL MET for L LEG w 29% reduction to date.   12/29/22 GOAL MET for R LEG w 23.3% reduction achieved to date.  5.  Pt will be able to don and doff appropriate compression garments and devices using assistive devices and extra time and Max CG assistance within 1 week of issue date for optimal lymphedema self-care. Baseline: Dependent Goal status: 12/27/22 GOAL MET PLAN:  OT  FREQUENCY: 2 x/week; 01/20/23: Reduce OT to 1 x weekly while awaiting custom garment delivery  OT DURATION: 12  weeks other: and PRN  PLANNED INTERVENTIONS: Therapeutic exercises, Therapeutic activity, Patient/Family education, Self Care, DME instructions, Manual lymph drainage, Compression bandaging, Manual therapy, and skin care during MLD, fit with appropriate custom compression garments and devices, fit with appropriate compression device  which  follows lymphatic pathways and anatomic distribution  PLAN FOR NEXT SESSION:  Continue RLE/RLQ CDT including  RLE multilayer compression bandages RLE MLD w simultaneous skin care  Loel Dubonnet, MS, OTR/L, CLT-LANA 01/20/23 2:03 PM  OUTPATIENT OCCUPATIONAL THERAPY TREATMENT NOTE  BILATERAL LOWER EXTREMITY LYMPHEDEMA  Patient Name: Sharon Ware MRN: [id number] DOB:06-May-1950, 71 y.o., female Today's Date: 01/20/2023   END OF SESSION:   OT End of Session - 01/20/23 1110     Visit Number 25    Number of Visits 36    Date for OT Re-Evaluation 03/24/23    OT Start Time 1105    OT Stop Time 1200    OT Time Calculation (min) 55 min    Activity Tolerance Patient tolerated treatment well;No increased pain    Behavior During Therapy WFL for tasks assessed/performed               Past Medical History:  Diagnosis Date   Aortic atherosclerosis (HCC)    Arthritis of both knees    Breast cancer (HCC)    Cataract    Genitourinary syndrome of menopause    Hiatal hernia    History of kidney stones    Hyperlipidemia    Hypertension    Long term current use of aromatase inhibitor    a.) letrozole   Long term current use of aspirin    Lymphedema    Malignant neoplasm of upper-inner quadrant of left breast in female, estrogen receptor positive (HCC)    Morbid obesity with BMI of 60.0-69.9, adult (HCC)    Pneumonia    T2DM (type 2 diabetes mellitus) (HCC)    Thoracic ascending aortic aneurysm (HCC) 09/25/2022   a.) CT chest  09/25/2022: 4.2 cm   Umbilical hernia    Urinary tract infection due to ESBL Klebsiella    Vitamin D deficiency    Past Surgical History:  Procedure Laterality Date   ABDOMINAL HYSTERECTOMY     x2   BREAST BIOPSY Right 1990s   neg   BREAST BIOPSY Right 2000s   neg   BREAST BIOPSY  OUTPATIENT OCCUPATIONAL THERAPY TREATMENT NOTE  BILATERAL LOWER EXTREMITY LYMPHEDEMA  Patient Name: Sharon Ware MRN: [id number] DOB:29-Mar-1951, 72 y.o., female Today's Date: 01/20/2023   END OF SESSION:   OT End of Session - 01/20/23 1110     Visit Number 25    Number of Visits 36    Date for OT Re-Evaluation 03/24/23    OT Start Time 1105    OT Stop Time 1200    OT Time Calculation (min) 55 min    Activity Tolerance Patient tolerated treatment well;No increased pain    Behavior During Therapy WFL for tasks assessed/performed               Past Medical History:  Diagnosis Date   Aortic atherosclerosis (HCC)    Arthritis of both knees    Breast cancer (HCC)    Cataract    Genitourinary syndrome of menopause    Hiatal hernia    History of kidney stones    Hyperlipidemia    Hypertension    Long term current use of aromatase inhibitor    a.) letrozole   Long term current use of aspirin    Lymphedema    Malignant neoplasm of upper-inner quadrant of left breast in female, estrogen receptor positive (HCC)    Morbid obesity with BMI of 60.0-69.9, adult (HCC)    Pneumonia    T2DM (type 2 diabetes mellitus) (HCC)    Thoracic ascending aortic aneurysm (HCC) 09/25/2022   a.) CT chest 09/25/2022: 4.2 cm   Umbilical hernia    Urinary tract infection due to ESBL Klebsiella    Vitamin D deficiency    Past Surgical History:  Procedure Laterality Date   ABDOMINAL HYSTERECTOMY     x2   BREAST BIOPSY Right 1990s   neg   BREAST BIOPSY Right 2000s   neg   BREAST BIOPSY Left 11/24/2020   u/s bx 10:30/8 cmfn-"heart" marker   BREAST EXCISIONAL BIOPSY Right    neg   BREAST LUMPECTOMY     BREAST LUMPECTOMY WITH RADIOFREQUENCY TAG IDENTIFICATION Left 12/10/2020   Procedure: BREAST LUMPECTOMY WITH RADIOFREQUENCY TAG IDENTIFICATION;  Surgeon: Campbell Lerner, MD;  Location: [facility];  Service: General;  Laterality: Left;   COLONOSCOPY     CYSTOSCOPY/URETEROSCOPY/HOLMIUM  LASER/STENT PLACEMENT Right 12/10/2022   Procedure: CYSTOSCOPY/URETEROSCOPY/HOLMIUM LASER/STENT PLACEMENT;  Surgeon: Sondra Come, MD;  Location: [facility];  Service: Urology;  Laterality: Right;   DILATION AND CURETTAGE OF UTERUS     EYE SURGERY  2004   cataract surgery and laser surgery   Salpingo-oophorectomy     Patient Active Problem List   Diagnosis Date Noted   Kidney stones 12/08/2022   Lymphedema 09/25/2022   Hypoxia 09/25/2022   Edema 01/30/2021   Malignant neoplasm of upper-inner quadrant of left breast in female, estrogen receptor positive (HCC) 12/01/2020   Goals of care, counseling/discussion 12/01/2020   Morbid obesity (HCC) 08/08/2018   BMI 60.0-69.9, adult (HCC) 08/08/2018   Controlled diabetes mellitus type 2 with complications (HCC) 08/08/2018   Hyperlipidemia 08/08/2018   Hypertension associated with diabetes (HCC) 08/08/2018   Arthritis of both knees 08/08/2018   Vitamin D deficiency 08/08/2018   PCP: Olevia Perches, DO  REFERRING PROVIDER: Gwynn Burly, DO  REFERRING DIAG: I89.0  THERAPY DIAG:  Lymphedema, not elsewhere classified [I89.0]  Rationale for Evaluation and Treatment: Rehabilitation  ONSET DATE: 1 yr ago without known precipitating event- by report  SUBJECTIVE:  OUTPATIENT OCCUPATIONAL THERAPY TREATMENT NOTE  BILATERAL LOWER EXTREMITY LYMPHEDEMA  Patient Name: Sharon Ware MRN: [id number] DOB:29-Mar-1951, 72 y.o., female Today's Date: 01/20/2023   END OF SESSION:   OT End of Session - 01/20/23 1110     Visit Number 25    Number of Visits 36    Date for OT Re-Evaluation 03/24/23    OT Start Time 1105    OT Stop Time 1200    OT Time Calculation (min) 55 min    Activity Tolerance Patient tolerated treatment well;No increased pain    Behavior During Therapy WFL for tasks assessed/performed               Past Medical History:  Diagnosis Date   Aortic atherosclerosis (HCC)    Arthritis of both knees    Breast cancer (HCC)    Cataract    Genitourinary syndrome of menopause    Hiatal hernia    History of kidney stones    Hyperlipidemia    Hypertension    Long term current use of aromatase inhibitor    a.) letrozole   Long term current use of aspirin    Lymphedema    Malignant neoplasm of upper-inner quadrant of left breast in female, estrogen receptor positive (HCC)    Morbid obesity with BMI of 60.0-69.9, adult (HCC)    Pneumonia    T2DM (type 2 diabetes mellitus) (HCC)    Thoracic ascending aortic aneurysm (HCC) 09/25/2022   a.) CT chest 09/25/2022: 4.2 cm   Umbilical hernia    Urinary tract infection due to ESBL Klebsiella    Vitamin D deficiency    Past Surgical History:  Procedure Laterality Date   ABDOMINAL HYSTERECTOMY     x2   BREAST BIOPSY Right 1990s   neg   BREAST BIOPSY Right 2000s   neg   BREAST BIOPSY Left 11/24/2020   u/s bx 10:30/8 cmfn-"heart" marker   BREAST EXCISIONAL BIOPSY Right    neg   BREAST LUMPECTOMY     BREAST LUMPECTOMY WITH RADIOFREQUENCY TAG IDENTIFICATION Left 12/10/2020   Procedure: BREAST LUMPECTOMY WITH RADIOFREQUENCY TAG IDENTIFICATION;  Surgeon: Campbell Lerner, MD;  Location: [facility];  Service: General;  Laterality: Left;   COLONOSCOPY     CYSTOSCOPY/URETEROSCOPY/HOLMIUM  LASER/STENT PLACEMENT Right 12/10/2022   Procedure: CYSTOSCOPY/URETEROSCOPY/HOLMIUM LASER/STENT PLACEMENT;  Surgeon: Sondra Come, MD;  Location: [facility];  Service: Urology;  Laterality: Right;   DILATION AND CURETTAGE OF UTERUS     EYE SURGERY  2004   cataract surgery and laser surgery   Salpingo-oophorectomy     Patient Active Problem List   Diagnosis Date Noted   Kidney stones 12/08/2022   Lymphedema 09/25/2022   Hypoxia 09/25/2022   Edema 01/30/2021   Malignant neoplasm of upper-inner quadrant of left breast in female, estrogen receptor positive (HCC) 12/01/2020   Goals of care, counseling/discussion 12/01/2020   Morbid obesity (HCC) 08/08/2018   BMI 60.0-69.9, adult (HCC) 08/08/2018   Controlled diabetes mellitus type 2 with complications (HCC) 08/08/2018   Hyperlipidemia 08/08/2018   Hypertension associated with diabetes (HCC) 08/08/2018   Arthritis of both knees 08/08/2018   Vitamin D deficiency 08/08/2018   PCP: Olevia Perches, DO  REFERRING PROVIDER: Gwynn Burly, DO  REFERRING DIAG: I89.0  THERAPY DIAG:  Lymphedema, not elsewhere classified [I89.0]  Rationale for Evaluation and Treatment: Rehabilitation  ONSET DATE: 1 yr ago without known precipitating event- by report  SUBJECTIVE:  achieve at least 85% compliance with all lymphedema self-care home program components, including  daily skin care, multilayer , gradient compression wraps with daily changes, daily simple self MLD and daily lymphatic pumping therex to achieve optimal clinical outcome and to habituate self care regime for optimal LE self-management over time. Baseline: Dependent Goal status:11/05/22 GOAL MET  4.  Pt will achieve at least a 10% volume reductions bilaterally below the knees to return limb to more typical size and shape, to limit infection risk and LE progression, to decrease pain, to improve function, and to improve body image and QOL. Baseline: Dependent Goal status:11/05/22 GOAL MET for L LEG w 29% reduction to date.   12/29/22 GOAL MET for R LEG w 23.3% reduction achieved to date.  5.  Pt will be able to don and doff appropriate compression garments and devices using assistive devices and extra time and Max CG assistance within 1 week of issue date for optimal lymphedema self-care. Baseline: Dependent Goal status: 12/27/22 GOAL MET PLAN:  OT  FREQUENCY: 2 x/week; 01/20/23: Reduce OT to 1 x weekly while awaiting custom garment delivery  OT DURATION: 12  weeks other: and PRN  PLANNED INTERVENTIONS: Therapeutic exercises, Therapeutic activity, Patient/Family education, Self Care, DME instructions, Manual lymph drainage, Compression bandaging, Manual therapy, and skin care during MLD, fit with appropriate custom compression garments and devices, fit with appropriate compression device  which  follows lymphatic pathways and anatomic distribution  PLAN FOR NEXT SESSION:  Continue RLE/RLQ CDT including  RLE multilayer compression bandages RLE MLD w simultaneous skin care  Loel Dubonnet, MS, OTR/L, CLT-LANA 01/20/23 2:03 PM  OUTPATIENT OCCUPATIONAL THERAPY TREATMENT NOTE  BILATERAL LOWER EXTREMITY LYMPHEDEMA  Patient Name: Sharon Ware MRN: [id number] DOB:06-May-1950, 71 y.o., female Today's Date: 01/20/2023   END OF SESSION:   OT End of Session - 01/20/23 1110     Visit Number 25    Number of Visits 36    Date for OT Re-Evaluation 03/24/23    OT Start Time 1105    OT Stop Time 1200    OT Time Calculation (min) 55 min    Activity Tolerance Patient tolerated treatment well;No increased pain    Behavior During Therapy WFL for tasks assessed/performed               Past Medical History:  Diagnosis Date   Aortic atherosclerosis (HCC)    Arthritis of both knees    Breast cancer (HCC)    Cataract    Genitourinary syndrome of menopause    Hiatal hernia    History of kidney stones    Hyperlipidemia    Hypertension    Long term current use of aromatase inhibitor    a.) letrozole   Long term current use of aspirin    Lymphedema    Malignant neoplasm of upper-inner quadrant of left breast in female, estrogen receptor positive (HCC)    Morbid obesity with BMI of 60.0-69.9, adult (HCC)    Pneumonia    T2DM (type 2 diabetes mellitus) (HCC)    Thoracic ascending aortic aneurysm (HCC) 09/25/2022   a.) CT chest  09/25/2022: 4.2 cm   Umbilical hernia    Urinary tract infection due to ESBL Klebsiella    Vitamin D deficiency    Past Surgical History:  Procedure Laterality Date   ABDOMINAL HYSTERECTOMY     x2   BREAST BIOPSY Right 1990s   neg   BREAST BIOPSY Right 2000s   neg   BREAST BIOPSY  achieve at least 85% compliance with all lymphedema self-care home program components, including  daily skin care, multilayer , gradient compression wraps with daily changes, daily simple self MLD and daily lymphatic pumping therex to achieve optimal clinical outcome and to habituate self care regime for optimal LE self-management over time. Baseline: Dependent Goal status:11/05/22 GOAL MET  4.  Pt will achieve at least a 10% volume reductions bilaterally below the knees to return limb to more typical size and shape, to limit infection risk and LE progression, to decrease pain, to improve function, and to improve body image and QOL. Baseline: Dependent Goal status:11/05/22 GOAL MET for L LEG w 29% reduction to date.   12/29/22 GOAL MET for R LEG w 23.3% reduction achieved to date.  5.  Pt will be able to don and doff appropriate compression garments and devices using assistive devices and extra time and Max CG assistance within 1 week of issue date for optimal lymphedema self-care. Baseline: Dependent Goal status: 12/27/22 GOAL MET PLAN:  OT  FREQUENCY: 2 x/week; 01/20/23: Reduce OT to 1 x weekly while awaiting custom garment delivery  OT DURATION: 12  weeks other: and PRN  PLANNED INTERVENTIONS: Therapeutic exercises, Therapeutic activity, Patient/Family education, Self Care, DME instructions, Manual lymph drainage, Compression bandaging, Manual therapy, and skin care during MLD, fit with appropriate custom compression garments and devices, fit with appropriate compression device  which  follows lymphatic pathways and anatomic distribution  PLAN FOR NEXT SESSION:  Continue RLE/RLQ CDT including  RLE multilayer compression bandages RLE MLD w simultaneous skin care  Loel Dubonnet, MS, OTR/L, CLT-LANA 01/20/23 2:03 PM  OUTPATIENT OCCUPATIONAL THERAPY TREATMENT NOTE  BILATERAL LOWER EXTREMITY LYMPHEDEMA  Patient Name: Sharon Ware MRN: [id number] DOB:06-May-1950, 71 y.o., female Today's Date: 01/20/2023   END OF SESSION:   OT End of Session - 01/20/23 1110     Visit Number 25    Number of Visits 36    Date for OT Re-Evaluation 03/24/23    OT Start Time 1105    OT Stop Time 1200    OT Time Calculation (min) 55 min    Activity Tolerance Patient tolerated treatment well;No increased pain    Behavior During Therapy WFL for tasks assessed/performed               Past Medical History:  Diagnosis Date   Aortic atherosclerosis (HCC)    Arthritis of both knees    Breast cancer (HCC)    Cataract    Genitourinary syndrome of menopause    Hiatal hernia    History of kidney stones    Hyperlipidemia    Hypertension    Long term current use of aromatase inhibitor    a.) letrozole   Long term current use of aspirin    Lymphedema    Malignant neoplasm of upper-inner quadrant of left breast in female, estrogen receptor positive (HCC)    Morbid obesity with BMI of 60.0-69.9, adult (HCC)    Pneumonia    T2DM (type 2 diabetes mellitus) (HCC)    Thoracic ascending aortic aneurysm (HCC) 09/25/2022   a.) CT chest  09/25/2022: 4.2 cm   Umbilical hernia    Urinary tract infection due to ESBL Klebsiella    Vitamin D deficiency    Past Surgical History:  Procedure Laterality Date   ABDOMINAL HYSTERECTOMY     x2   BREAST BIOPSY Right 1990s   neg   BREAST BIOPSY Right 2000s   neg   BREAST BIOPSY  OUTPATIENT OCCUPATIONAL THERAPY TREATMENT NOTE  BILATERAL LOWER EXTREMITY LYMPHEDEMA  Patient Name: Sharon Ware MRN: [id number] DOB:29-Mar-1951, 72 y.o., female Today's Date: 01/20/2023   END OF SESSION:   OT End of Session - 01/20/23 1110     Visit Number 25    Number of Visits 36    Date for OT Re-Evaluation 03/24/23    OT Start Time 1105    OT Stop Time 1200    OT Time Calculation (min) 55 min    Activity Tolerance Patient tolerated treatment well;No increased pain    Behavior During Therapy WFL for tasks assessed/performed               Past Medical History:  Diagnosis Date   Aortic atherosclerosis (HCC)    Arthritis of both knees    Breast cancer (HCC)    Cataract    Genitourinary syndrome of menopause    Hiatal hernia    History of kidney stones    Hyperlipidemia    Hypertension    Long term current use of aromatase inhibitor    a.) letrozole   Long term current use of aspirin    Lymphedema    Malignant neoplasm of upper-inner quadrant of left breast in female, estrogen receptor positive (HCC)    Morbid obesity with BMI of 60.0-69.9, adult (HCC)    Pneumonia    T2DM (type 2 diabetes mellitus) (HCC)    Thoracic ascending aortic aneurysm (HCC) 09/25/2022   a.) CT chest 09/25/2022: 4.2 cm   Umbilical hernia    Urinary tract infection due to ESBL Klebsiella    Vitamin D deficiency    Past Surgical History:  Procedure Laterality Date   ABDOMINAL HYSTERECTOMY     x2   BREAST BIOPSY Right 1990s   neg   BREAST BIOPSY Right 2000s   neg   BREAST BIOPSY Left 11/24/2020   u/s bx 10:30/8 cmfn-"heart" marker   BREAST EXCISIONAL BIOPSY Right    neg   BREAST LUMPECTOMY     BREAST LUMPECTOMY WITH RADIOFREQUENCY TAG IDENTIFICATION Left 12/10/2020   Procedure: BREAST LUMPECTOMY WITH RADIOFREQUENCY TAG IDENTIFICATION;  Surgeon: Campbell Lerner, MD;  Location: [facility];  Service: General;  Laterality: Left;   COLONOSCOPY     CYSTOSCOPY/URETEROSCOPY/HOLMIUM  LASER/STENT PLACEMENT Right 12/10/2022   Procedure: CYSTOSCOPY/URETEROSCOPY/HOLMIUM LASER/STENT PLACEMENT;  Surgeon: Sondra Come, MD;  Location: [facility];  Service: Urology;  Laterality: Right;   DILATION AND CURETTAGE OF UTERUS     EYE SURGERY  2004   cataract surgery and laser surgery   Salpingo-oophorectomy     Patient Active Problem List   Diagnosis Date Noted   Kidney stones 12/08/2022   Lymphedema 09/25/2022   Hypoxia 09/25/2022   Edema 01/30/2021   Malignant neoplasm of upper-inner quadrant of left breast in female, estrogen receptor positive (HCC) 12/01/2020   Goals of care, counseling/discussion 12/01/2020   Morbid obesity (HCC) 08/08/2018   BMI 60.0-69.9, adult (HCC) 08/08/2018   Controlled diabetes mellitus type 2 with complications (HCC) 08/08/2018   Hyperlipidemia 08/08/2018   Hypertension associated with diabetes (HCC) 08/08/2018   Arthritis of both knees 08/08/2018   Vitamin D deficiency 08/08/2018   PCP: Olevia Perches, DO  REFERRING PROVIDER: Gwynn Burly, DO  REFERRING DIAG: I89.0  THERAPY DIAG:  Lymphedema, not elsewhere classified [I89.0]  Rationale for Evaluation and Treatment: Rehabilitation  ONSET DATE: 1 yr ago without known precipitating event- by report  SUBJECTIVE:  achieve at least 85% compliance with all lymphedema self-care home program components, including  daily skin care, multilayer , gradient compression wraps with daily changes, daily simple self MLD and daily lymphatic pumping therex to achieve optimal clinical outcome and to habituate self care regime for optimal LE self-management over time. Baseline: Dependent Goal status:11/05/22 GOAL MET  4.  Pt will achieve at least a 10% volume reductions bilaterally below the knees to return limb to more typical size and shape, to limit infection risk and LE progression, to decrease pain, to improve function, and to improve body image and QOL. Baseline: Dependent Goal status:11/05/22 GOAL MET for L LEG w 29% reduction to date.   12/29/22 GOAL MET for R LEG w 23.3% reduction achieved to date.  5.  Pt will be able to don and doff appropriate compression garments and devices using assistive devices and extra time and Max CG assistance within 1 week of issue date for optimal lymphedema self-care. Baseline: Dependent Goal status: 12/27/22 GOAL MET PLAN:  OT  FREQUENCY: 2 x/week; 01/20/23: Reduce OT to 1 x weekly while awaiting custom garment delivery  OT DURATION: 12  weeks other: and PRN  PLANNED INTERVENTIONS: Therapeutic exercises, Therapeutic activity, Patient/Family education, Self Care, DME instructions, Manual lymph drainage, Compression bandaging, Manual therapy, and skin care during MLD, fit with appropriate custom compression garments and devices, fit with appropriate compression device  which  follows lymphatic pathways and anatomic distribution  PLAN FOR NEXT SESSION:  Continue RLE/RLQ CDT including  RLE multilayer compression bandages RLE MLD w simultaneous skin care  Loel Dubonnet, MS, OTR/L, CLT-LANA 01/20/23 2:03 PM  OUTPATIENT OCCUPATIONAL THERAPY TREATMENT NOTE  BILATERAL LOWER EXTREMITY LYMPHEDEMA  Patient Name: Sharon Ware MRN: [id number] DOB:06-May-1950, 71 y.o., female Today's Date: 01/20/2023   END OF SESSION:   OT End of Session - 01/20/23 1110     Visit Number 25    Number of Visits 36    Date for OT Re-Evaluation 03/24/23    OT Start Time 1105    OT Stop Time 1200    OT Time Calculation (min) 55 min    Activity Tolerance Patient tolerated treatment well;No increased pain    Behavior During Therapy WFL for tasks assessed/performed               Past Medical History:  Diagnosis Date   Aortic atherosclerosis (HCC)    Arthritis of both knees    Breast cancer (HCC)    Cataract    Genitourinary syndrome of menopause    Hiatal hernia    History of kidney stones    Hyperlipidemia    Hypertension    Long term current use of aromatase inhibitor    a.) letrozole   Long term current use of aspirin    Lymphedema    Malignant neoplasm of upper-inner quadrant of left breast in female, estrogen receptor positive (HCC)    Morbid obesity with BMI of 60.0-69.9, adult (HCC)    Pneumonia    T2DM (type 2 diabetes mellitus) (HCC)    Thoracic ascending aortic aneurysm (HCC) 09/25/2022   a.) CT chest  09/25/2022: 4.2 cm   Umbilical hernia    Urinary tract infection due to ESBL Klebsiella    Vitamin D deficiency    Past Surgical History:  Procedure Laterality Date   ABDOMINAL HYSTERECTOMY     x2   BREAST BIOPSY Right 1990s   neg   BREAST BIOPSY Right 2000s   neg   BREAST BIOPSY  OUTPATIENT OCCUPATIONAL THERAPY TREATMENT NOTE  BILATERAL LOWER EXTREMITY LYMPHEDEMA  Patient Name: Sharon Ware MRN: [id number] DOB:29-Mar-1951, 72 y.o., female Today's Date: 01/20/2023   END OF SESSION:   OT End of Session - 01/20/23 1110     Visit Number 25    Number of Visits 36    Date for OT Re-Evaluation 03/24/23    OT Start Time 1105    OT Stop Time 1200    OT Time Calculation (min) 55 min    Activity Tolerance Patient tolerated treatment well;No increased pain    Behavior During Therapy WFL for tasks assessed/performed               Past Medical History:  Diagnosis Date   Aortic atherosclerosis (HCC)    Arthritis of both knees    Breast cancer (HCC)    Cataract    Genitourinary syndrome of menopause    Hiatal hernia    History of kidney stones    Hyperlipidemia    Hypertension    Long term current use of aromatase inhibitor    a.) letrozole   Long term current use of aspirin    Lymphedema    Malignant neoplasm of upper-inner quadrant of left breast in female, estrogen receptor positive (HCC)    Morbid obesity with BMI of 60.0-69.9, adult (HCC)    Pneumonia    T2DM (type 2 diabetes mellitus) (HCC)    Thoracic ascending aortic aneurysm (HCC) 09/25/2022   a.) CT chest 09/25/2022: 4.2 cm   Umbilical hernia    Urinary tract infection due to ESBL Klebsiella    Vitamin D deficiency    Past Surgical History:  Procedure Laterality Date   ABDOMINAL HYSTERECTOMY     x2   BREAST BIOPSY Right 1990s   neg   BREAST BIOPSY Right 2000s   neg   BREAST BIOPSY Left 11/24/2020   u/s bx 10:30/8 cmfn-"heart" marker   BREAST EXCISIONAL BIOPSY Right    neg   BREAST LUMPECTOMY     BREAST LUMPECTOMY WITH RADIOFREQUENCY TAG IDENTIFICATION Left 12/10/2020   Procedure: BREAST LUMPECTOMY WITH RADIOFREQUENCY TAG IDENTIFICATION;  Surgeon: Campbell Lerner, MD;  Location: [facility];  Service: General;  Laterality: Left;   COLONOSCOPY     CYSTOSCOPY/URETEROSCOPY/HOLMIUM  LASER/STENT PLACEMENT Right 12/10/2022   Procedure: CYSTOSCOPY/URETEROSCOPY/HOLMIUM LASER/STENT PLACEMENT;  Surgeon: Sondra Come, MD;  Location: [facility];  Service: Urology;  Laterality: Right;   DILATION AND CURETTAGE OF UTERUS     EYE SURGERY  2004   cataract surgery and laser surgery   Salpingo-oophorectomy     Patient Active Problem List   Diagnosis Date Noted   Kidney stones 12/08/2022   Lymphedema 09/25/2022   Hypoxia 09/25/2022   Edema 01/30/2021   Malignant neoplasm of upper-inner quadrant of left breast in female, estrogen receptor positive (HCC) 12/01/2020   Goals of care, counseling/discussion 12/01/2020   Morbid obesity (HCC) 08/08/2018   BMI 60.0-69.9, adult (HCC) 08/08/2018   Controlled diabetes mellitus type 2 with complications (HCC) 08/08/2018   Hyperlipidemia 08/08/2018   Hypertension associated with diabetes (HCC) 08/08/2018   Arthritis of both knees 08/08/2018   Vitamin D deficiency 08/08/2018   PCP: Olevia Perches, DO  REFERRING PROVIDER: Gwynn Burly, DO  REFERRING DIAG: I89.0  THERAPY DIAG:  Lymphedema, not elsewhere classified [I89.0]  Rationale for Evaluation and Treatment: Rehabilitation  ONSET DATE: 1 yr ago without known precipitating event- by report  SUBJECTIVE:

## 2023-01-25 ENCOUNTER — Ambulatory Visit: Payer: Medicare Other | Admitting: Occupational Therapy

## 2023-01-26 ENCOUNTER — Ambulatory Visit
Admission: RE | Admit: 2023-01-26 | Discharge: 2023-01-26 | Disposition: A | Discharge status: Home / Self Care | Payer: Medicare Other | Source: Ambulatory Visit | Attending: Urology | Admitting: Urology

## 2023-01-26 DIAGNOSIS — N200 Calculus of kidney: Secondary | ICD-10-CM | POA: Diagnosis present

## 2023-01-27 ENCOUNTER — Ambulatory Visit: Payer: Medicare Other | Admitting: Occupational Therapy

## 2023-01-28 ENCOUNTER — Ambulatory Visit: Payer: Medicare Other | Admitting: Occupational Therapy

## 2023-01-28 ENCOUNTER — Encounter: Payer: Self-pay | Admitting: Occupational Therapy

## 2023-01-28 DIAGNOSIS — I890 Lymphedema, not elsewhere classified: Secondary | ICD-10-CM | POA: Diagnosis not present

## 2023-01-28 NOTE — Therapy (Signed)
points to improve basic and instrumental ADLs performance, including lymphedema self-care.  Baseline: 33 % Goal status: ONGOING-PROGRESSING  3.  During Intensive phase CDT Pt will achieve at least 85% compliance with all lymphedema self-care home program components, including  daily skin care, multilayer , gradient compression wraps with daily changes, daily simple self MLD and daily lymphatic pumping therex to achieve optimal clinical outcome and to habituate self care regime for optimal LE self-management over time. Baseline: Dependent Goal status:11/05/22 GOAL MET  4.  Pt will achieve at least a 10% volume reductions bilaterally below the knees to return limb to more typical size and shape, to limit infection risk and LE progression, to decrease pain, to improve function, and to improve body image and QOL. Baseline: Dependent Goal status:11/05/22 GOAL MET for L LEG w 29% reduction to date.   12/29/22 GOAL MET for R LEG w 23.3% reduction achieved to date.  5.  Pt will be able to don and doff appropriate compression garments and devices using assistive devices and extra time and Max CG  assistance within 1 week of issue date for optimal lymphedema self-care. Baseline: Dependent Goal status: 12/27/22 GOAL MET PLAN:  OT FREQUENCY: 2 x/week; 01/20/23: Reduce OT to 1 x weekly while awaiting custom garment delivery  OT DURATION: 12  weeks other: and PRN  PLANNED INTERVENTIONS: Therapeutic exercises, Therapeutic activity, Patient/Family education, Self Care, DME instructions, Manual lymph drainage, Compression bandaging, Manual therapy, and skin care during MLD, fit with appropriate custom compression garments and devices, fit with appropriate compression device  which  follows lymphatic pathways and anatomic distribution  PLAN FOR NEXT SESSION:  Continue RLE/RLQ CDT including  RLE multilayer compression bandages RLE MLD w simultaneous skin care  Loel Dubonnet, MS, OTR/L, CLT-LANA 01/28/23 12:29 PM  OUTPATIENT OCCUPATIONAL THERAPY TREATMENT NOTE  BILATERAL LOWER EXTREMITY LYMPHEDEMA  Patient Name: Sharon Ware MRN: [id number] DOB:08-09-1950, 72 y.o., female Today's Date: 01/28/2023   END OF SESSION:   OT End of Session - 01/28/23 1117     Visit Number 26    Number of Visits 36    Date for OT Re-Evaluation 03/24/23    OT Start Time 1105    OT Stop Time 1205    OT Time Calculation (min) 60 min    Activity Tolerance Patient tolerated treatment well;No increased pain    Behavior During Therapy WFL for tasks assessed/performed               Past Medical History:  Diagnosis Date   Aortic atherosclerosis (HCC)    Arthritis of both knees    Breast cancer (HCC)    Cataract    Genitourinary syndrome of menopause    Hiatal hernia    History of kidney stones    Hyperlipidemia    Hypertension    Long term current use of aromatase inhibitor    a.) letrozole   Long term current use of aspirin    Lymphedema    Malignant neoplasm of upper-inner quadrant of left breast in female, estrogen receptor positive (HCC)    Morbid obesity with BMI of 60.0-69.9,  adult (HCC)    Pneumonia    T2DM (type 2 diabetes mellitus) (HCC)    Thoracic ascending aortic aneurysm (HCC) 09/25/2022   a.) CT chest 09/25/2022: 4.2 cm   Umbilical hernia    Urinary tract infection due to ESBL Klebsiella    Vitamin D deficiency    Past Surgical History:  Procedure Laterality Date   ABDOMINAL HYSTERECTOMY  OUTPATIENT OCCUPATIONAL THERAPY TREATMENT NOTE  BILATERAL LOWER EXTREMITY LYMPHEDEMA  Patient Name: Sharon Ware MRN: [id number] DOB:1950/04/08, 72 y.o., female Today's Date: 01/28/2023   END OF SESSION:   OT End of Session - 01/28/23 1117     Visit Number 26    Number of Visits 36    Date for OT Re-Evaluation 03/24/23    OT Start Time 1105    OT Stop Time 1205    OT Time Calculation (min) 60 min    Activity Tolerance Patient tolerated treatment well;No increased pain    Behavior During Therapy WFL for tasks assessed/performed               Past Medical History:  Diagnosis Date   Aortic atherosclerosis (HCC)    Arthritis of both knees    Breast cancer (HCC)    Cataract    Genitourinary syndrome of menopause    Hiatal hernia    History of kidney stones    Hyperlipidemia    Hypertension    Long term current use of aromatase inhibitor    a.) letrozole   Long term current use of aspirin    Lymphedema    Malignant neoplasm of upper-inner quadrant of left breast in female, estrogen receptor positive (HCC)    Morbid obesity with BMI of 60.0-69.9, adult (HCC)    Pneumonia    T2DM (type 2 diabetes mellitus) (HCC)    Thoracic ascending aortic aneurysm (HCC) 09/25/2022   a.) CT chest 09/25/2022: 4.2 cm   Umbilical hernia    Urinary tract infection due to ESBL Klebsiella    Vitamin D deficiency    Past Surgical History:  Procedure Laterality Date   ABDOMINAL HYSTERECTOMY     x2   BREAST BIOPSY Right 1990s   neg   BREAST BIOPSY Right 2000s   neg   BREAST BIOPSY Left 11/24/2020   u/s bx 10:30/8 cmfn-"heart" marker   BREAST EXCISIONAL BIOPSY Right    neg   BREAST LUMPECTOMY     BREAST LUMPECTOMY WITH RADIOFREQUENCY TAG IDENTIFICATION Left 12/10/2020   Procedure: BREAST LUMPECTOMY WITH RADIOFREQUENCY TAG IDENTIFICATION;  Surgeon: Campbell Lerner, MD;  Location: [facility];  Service: General;  Laterality: Left;   COLONOSCOPY     CYSTOSCOPY/URETEROSCOPY/HOLMIUM  LASER/STENT PLACEMENT Right 12/10/2022   Procedure: CYSTOSCOPY/URETEROSCOPY/HOLMIUM LASER/STENT PLACEMENT;  Surgeon: Sondra Come, MD;  Location: [facility];  Service: Urology;  Laterality: Right;   DILATION AND CURETTAGE OF UTERUS     EYE SURGERY  2004   cataract surgery and laser surgery   Salpingo-oophorectomy     Patient Active Problem List   Diagnosis Date Noted   Kidney stones 12/08/2022   Lymphedema 09/25/2022   Hypoxia 09/25/2022   Edema 01/30/2021   Malignant neoplasm of upper-inner quadrant of left breast in female, estrogen receptor positive (HCC) 12/01/2020   Goals of care, counseling/discussion 12/01/2020   Morbid obesity (HCC) 08/08/2018   BMI 60.0-69.9, adult (HCC) 08/08/2018   Controlled diabetes mellitus type 2 with complications (HCC) 08/08/2018   Hyperlipidemia 08/08/2018   Hypertension associated with diabetes (HCC) 08/08/2018   Arthritis of both knees 08/08/2018   Vitamin D deficiency 08/08/2018   PCP: Olevia Perches, DO  REFERRING PROVIDER: Gwynn Burly, DO  REFERRING DIAG: I89.0  THERAPY DIAG:  Lymphedema, not elsewhere classified [I89.0]  Rationale for Evaluation and Treatment: Rehabilitation  ONSET DATE: 1 yr ago without known precipitating event- by report  SUBJECTIVE:  points to improve basic and instrumental ADLs performance, including lymphedema self-care.  Baseline: 33 % Goal status: ONGOING-PROGRESSING  3.  During Intensive phase CDT Pt will achieve at least 85% compliance with all lymphedema self-care home program components, including  daily skin care, multilayer , gradient compression wraps with daily changes, daily simple self MLD and daily lymphatic pumping therex to achieve optimal clinical outcome and to habituate self care regime for optimal LE self-management over time. Baseline: Dependent Goal status:11/05/22 GOAL MET  4.  Pt will achieve at least a 10% volume reductions bilaterally below the knees to return limb to more typical size and shape, to limit infection risk and LE progression, to decrease pain, to improve function, and to improve body image and QOL. Baseline: Dependent Goal status:11/05/22 GOAL MET for L LEG w 29% reduction to date.   12/29/22 GOAL MET for R LEG w 23.3% reduction achieved to date.  5.  Pt will be able to don and doff appropriate compression garments and devices using assistive devices and extra time and Max CG  assistance within 1 week of issue date for optimal lymphedema self-care. Baseline: Dependent Goal status: 12/27/22 GOAL MET PLAN:  OT FREQUENCY: 2 x/week; 01/20/23: Reduce OT to 1 x weekly while awaiting custom garment delivery  OT DURATION: 12  weeks other: and PRN  PLANNED INTERVENTIONS: Therapeutic exercises, Therapeutic activity, Patient/Family education, Self Care, DME instructions, Manual lymph drainage, Compression bandaging, Manual therapy, and skin care during MLD, fit with appropriate custom compression garments and devices, fit with appropriate compression device  which  follows lymphatic pathways and anatomic distribution  PLAN FOR NEXT SESSION:  Continue RLE/RLQ CDT including  RLE multilayer compression bandages RLE MLD w simultaneous skin care  Loel Dubonnet, MS, OTR/L, CLT-LANA 01/28/23 12:29 PM  OUTPATIENT OCCUPATIONAL THERAPY TREATMENT NOTE  BILATERAL LOWER EXTREMITY LYMPHEDEMA  Patient Name: Sharon Ware MRN: [id number] DOB:08-09-1950, 72 y.o., female Today's Date: 01/28/2023   END OF SESSION:   OT End of Session - 01/28/23 1117     Visit Number 26    Number of Visits 36    Date for OT Re-Evaluation 03/24/23    OT Start Time 1105    OT Stop Time 1205    OT Time Calculation (min) 60 min    Activity Tolerance Patient tolerated treatment well;No increased pain    Behavior During Therapy WFL for tasks assessed/performed               Past Medical History:  Diagnosis Date   Aortic atherosclerosis (HCC)    Arthritis of both knees    Breast cancer (HCC)    Cataract    Genitourinary syndrome of menopause    Hiatal hernia    History of kidney stones    Hyperlipidemia    Hypertension    Long term current use of aromatase inhibitor    a.) letrozole   Long term current use of aspirin    Lymphedema    Malignant neoplasm of upper-inner quadrant of left breast in female, estrogen receptor positive (HCC)    Morbid obesity with BMI of 60.0-69.9,  adult (HCC)    Pneumonia    T2DM (type 2 diabetes mellitus) (HCC)    Thoracic ascending aortic aneurysm (HCC) 09/25/2022   a.) CT chest 09/25/2022: 4.2 cm   Umbilical hernia    Urinary tract infection due to ESBL Klebsiella    Vitamin D deficiency    Past Surgical History:  Procedure Laterality Date   ABDOMINAL HYSTERECTOMY  points to improve basic and instrumental ADLs performance, including lymphedema self-care.  Baseline: 33 % Goal status: ONGOING-PROGRESSING  3.  During Intensive phase CDT Pt will achieve at least 85% compliance with all lymphedema self-care home program components, including  daily skin care, multilayer , gradient compression wraps with daily changes, daily simple self MLD and daily lymphatic pumping therex to achieve optimal clinical outcome and to habituate self care regime for optimal LE self-management over time. Baseline: Dependent Goal status:11/05/22 GOAL MET  4.  Pt will achieve at least a 10% volume reductions bilaterally below the knees to return limb to more typical size and shape, to limit infection risk and LE progression, to decrease pain, to improve function, and to improve body image and QOL. Baseline: Dependent Goal status:11/05/22 GOAL MET for L LEG w 29% reduction to date.   12/29/22 GOAL MET for R LEG w 23.3% reduction achieved to date.  5.  Pt will be able to don and doff appropriate compression garments and devices using assistive devices and extra time and Max CG  assistance within 1 week of issue date for optimal lymphedema self-care. Baseline: Dependent Goal status: 12/27/22 GOAL MET PLAN:  OT FREQUENCY: 2 x/week; 01/20/23: Reduce OT to 1 x weekly while awaiting custom garment delivery  OT DURATION: 12  weeks other: and PRN  PLANNED INTERVENTIONS: Therapeutic exercises, Therapeutic activity, Patient/Family education, Self Care, DME instructions, Manual lymph drainage, Compression bandaging, Manual therapy, and skin care during MLD, fit with appropriate custom compression garments and devices, fit with appropriate compression device  which  follows lymphatic pathways and anatomic distribution  PLAN FOR NEXT SESSION:  Continue RLE/RLQ CDT including  RLE multilayer compression bandages RLE MLD w simultaneous skin care  Loel Dubonnet, MS, OTR/L, CLT-LANA 01/28/23 12:29 PM  OUTPATIENT OCCUPATIONAL THERAPY TREATMENT NOTE  BILATERAL LOWER EXTREMITY LYMPHEDEMA  Patient Name: Sharon Ware MRN: [id number] DOB:08-09-1950, 72 y.o., female Today's Date: 01/28/2023   END OF SESSION:   OT End of Session - 01/28/23 1117     Visit Number 26    Number of Visits 36    Date for OT Re-Evaluation 03/24/23    OT Start Time 1105    OT Stop Time 1205    OT Time Calculation (min) 60 min    Activity Tolerance Patient tolerated treatment well;No increased pain    Behavior During Therapy WFL for tasks assessed/performed               Past Medical History:  Diagnosis Date   Aortic atherosclerosis (HCC)    Arthritis of both knees    Breast cancer (HCC)    Cataract    Genitourinary syndrome of menopause    Hiatal hernia    History of kidney stones    Hyperlipidemia    Hypertension    Long term current use of aromatase inhibitor    a.) letrozole   Long term current use of aspirin    Lymphedema    Malignant neoplasm of upper-inner quadrant of left breast in female, estrogen receptor positive (HCC)    Morbid obesity with BMI of 60.0-69.9,  adult (HCC)    Pneumonia    T2DM (type 2 diabetes mellitus) (HCC)    Thoracic ascending aortic aneurysm (HCC) 09/25/2022   a.) CT chest 09/25/2022: 4.2 cm   Umbilical hernia    Urinary tract infection due to ESBL Klebsiella    Vitamin D deficiency    Past Surgical History:  Procedure Laterality Date   ABDOMINAL HYSTERECTOMY  OUTPATIENT OCCUPATIONAL THERAPY TREATMENT NOTE  BILATERAL LOWER EXTREMITY LYMPHEDEMA  Patient Name: Sharon Ware MRN: [id number] DOB:1950/04/08, 72 y.o., female Today's Date: 01/28/2023   END OF SESSION:   OT End of Session - 01/28/23 1117     Visit Number 26    Number of Visits 36    Date for OT Re-Evaluation 03/24/23    OT Start Time 1105    OT Stop Time 1205    OT Time Calculation (min) 60 min    Activity Tolerance Patient tolerated treatment well;No increased pain    Behavior During Therapy WFL for tasks assessed/performed               Past Medical History:  Diagnosis Date   Aortic atherosclerosis (HCC)    Arthritis of both knees    Breast cancer (HCC)    Cataract    Genitourinary syndrome of menopause    Hiatal hernia    History of kidney stones    Hyperlipidemia    Hypertension    Long term current use of aromatase inhibitor    a.) letrozole   Long term current use of aspirin    Lymphedema    Malignant neoplasm of upper-inner quadrant of left breast in female, estrogen receptor positive (HCC)    Morbid obesity with BMI of 60.0-69.9, adult (HCC)    Pneumonia    T2DM (type 2 diabetes mellitus) (HCC)    Thoracic ascending aortic aneurysm (HCC) 09/25/2022   a.) CT chest 09/25/2022: 4.2 cm   Umbilical hernia    Urinary tract infection due to ESBL Klebsiella    Vitamin D deficiency    Past Surgical History:  Procedure Laterality Date   ABDOMINAL HYSTERECTOMY     x2   BREAST BIOPSY Right 1990s   neg   BREAST BIOPSY Right 2000s   neg   BREAST BIOPSY Left 11/24/2020   u/s bx 10:30/8 cmfn-"heart" marker   BREAST EXCISIONAL BIOPSY Right    neg   BREAST LUMPECTOMY     BREAST LUMPECTOMY WITH RADIOFREQUENCY TAG IDENTIFICATION Left 12/10/2020   Procedure: BREAST LUMPECTOMY WITH RADIOFREQUENCY TAG IDENTIFICATION;  Surgeon: Campbell Lerner, MD;  Location: [facility];  Service: General;  Laterality: Left;   COLONOSCOPY     CYSTOSCOPY/URETEROSCOPY/HOLMIUM  LASER/STENT PLACEMENT Right 12/10/2022   Procedure: CYSTOSCOPY/URETEROSCOPY/HOLMIUM LASER/STENT PLACEMENT;  Surgeon: Sondra Come, MD;  Location: [facility];  Service: Urology;  Laterality: Right;   DILATION AND CURETTAGE OF UTERUS     EYE SURGERY  2004   cataract surgery and laser surgery   Salpingo-oophorectomy     Patient Active Problem List   Diagnosis Date Noted   Kidney stones 12/08/2022   Lymphedema 09/25/2022   Hypoxia 09/25/2022   Edema 01/30/2021   Malignant neoplasm of upper-inner quadrant of left breast in female, estrogen receptor positive (HCC) 12/01/2020   Goals of care, counseling/discussion 12/01/2020   Morbid obesity (HCC) 08/08/2018   BMI 60.0-69.9, adult (HCC) 08/08/2018   Controlled diabetes mellitus type 2 with complications (HCC) 08/08/2018   Hyperlipidemia 08/08/2018   Hypertension associated with diabetes (HCC) 08/08/2018   Arthritis of both knees 08/08/2018   Vitamin D deficiency 08/08/2018   PCP: Olevia Perches, DO  REFERRING PROVIDER: Gwynn Burly, DO  REFERRING DIAG: I89.0  THERAPY DIAG:  Lymphedema, not elsewhere classified [I89.0]  Rationale for Evaluation and Treatment: Rehabilitation  ONSET DATE: 1 yr ago without known precipitating event- by report  SUBJECTIVE:  points to improve basic and instrumental ADLs performance, including lymphedema self-care.  Baseline: 33 % Goal status: ONGOING-PROGRESSING  3.  During Intensive phase CDT Pt will achieve at least 85% compliance with all lymphedema self-care home program components, including  daily skin care, multilayer , gradient compression wraps with daily changes, daily simple self MLD and daily lymphatic pumping therex to achieve optimal clinical outcome and to habituate self care regime for optimal LE self-management over time. Baseline: Dependent Goal status:11/05/22 GOAL MET  4.  Pt will achieve at least a 10% volume reductions bilaterally below the knees to return limb to more typical size and shape, to limit infection risk and LE progression, to decrease pain, to improve function, and to improve body image and QOL. Baseline: Dependent Goal status:11/05/22 GOAL MET for L LEG w 29% reduction to date.   12/29/22 GOAL MET for R LEG w 23.3% reduction achieved to date.  5.  Pt will be able to don and doff appropriate compression garments and devices using assistive devices and extra time and Max CG  assistance within 1 week of issue date for optimal lymphedema self-care. Baseline: Dependent Goal status: 12/27/22 GOAL MET PLAN:  OT FREQUENCY: 2 x/week; 01/20/23: Reduce OT to 1 x weekly while awaiting custom garment delivery  OT DURATION: 12  weeks other: and PRN  PLANNED INTERVENTIONS: Therapeutic exercises, Therapeutic activity, Patient/Family education, Self Care, DME instructions, Manual lymph drainage, Compression bandaging, Manual therapy, and skin care during MLD, fit with appropriate custom compression garments and devices, fit with appropriate compression device  which  follows lymphatic pathways and anatomic distribution  PLAN FOR NEXT SESSION:  Continue RLE/RLQ CDT including  RLE multilayer compression bandages RLE MLD w simultaneous skin care  Loel Dubonnet, MS, OTR/L, CLT-LANA 01/28/23 12:29 PM  OUTPATIENT OCCUPATIONAL THERAPY TREATMENT NOTE  BILATERAL LOWER EXTREMITY LYMPHEDEMA  Patient Name: Sharon Ware MRN: [id number] DOB:08-09-1950, 72 y.o., female Today's Date: 01/28/2023   END OF SESSION:   OT End of Session - 01/28/23 1117     Visit Number 26    Number of Visits 36    Date for OT Re-Evaluation 03/24/23    OT Start Time 1105    OT Stop Time 1205    OT Time Calculation (min) 60 min    Activity Tolerance Patient tolerated treatment well;No increased pain    Behavior During Therapy WFL for tasks assessed/performed               Past Medical History:  Diagnosis Date   Aortic atherosclerosis (HCC)    Arthritis of both knees    Breast cancer (HCC)    Cataract    Genitourinary syndrome of menopause    Hiatal hernia    History of kidney stones    Hyperlipidemia    Hypertension    Long term current use of aromatase inhibitor    a.) letrozole   Long term current use of aspirin    Lymphedema    Malignant neoplasm of upper-inner quadrant of left breast in female, estrogen receptor positive (HCC)    Morbid obesity with BMI of 60.0-69.9,  adult (HCC)    Pneumonia    T2DM (type 2 diabetes mellitus) (HCC)    Thoracic ascending aortic aneurysm (HCC) 09/25/2022   a.) CT chest 09/25/2022: 4.2 cm   Umbilical hernia    Urinary tract infection due to ESBL Klebsiella    Vitamin D deficiency    Past Surgical History:  Procedure Laterality Date   ABDOMINAL HYSTERECTOMY  points to improve basic and instrumental ADLs performance, including lymphedema self-care.  Baseline: 33 % Goal status: ONGOING-PROGRESSING  3.  During Intensive phase CDT Pt will achieve at least 85% compliance with all lymphedema self-care home program components, including  daily skin care, multilayer , gradient compression wraps with daily changes, daily simple self MLD and daily lymphatic pumping therex to achieve optimal clinical outcome and to habituate self care regime for optimal LE self-management over time. Baseline: Dependent Goal status:11/05/22 GOAL MET  4.  Pt will achieve at least a 10% volume reductions bilaterally below the knees to return limb to more typical size and shape, to limit infection risk and LE progression, to decrease pain, to improve function, and to improve body image and QOL. Baseline: Dependent Goal status:11/05/22 GOAL MET for L LEG w 29% reduction to date.   12/29/22 GOAL MET for R LEG w 23.3% reduction achieved to date.  5.  Pt will be able to don and doff appropriate compression garments and devices using assistive devices and extra time and Max CG  assistance within 1 week of issue date for optimal lymphedema self-care. Baseline: Dependent Goal status: 12/27/22 GOAL MET PLAN:  OT FREQUENCY: 2 x/week; 01/20/23: Reduce OT to 1 x weekly while awaiting custom garment delivery  OT DURATION: 12  weeks other: and PRN  PLANNED INTERVENTIONS: Therapeutic exercises, Therapeutic activity, Patient/Family education, Self Care, DME instructions, Manual lymph drainage, Compression bandaging, Manual therapy, and skin care during MLD, fit with appropriate custom compression garments and devices, fit with appropriate compression device  which  follows lymphatic pathways and anatomic distribution  PLAN FOR NEXT SESSION:  Continue RLE/RLQ CDT including  RLE multilayer compression bandages RLE MLD w simultaneous skin care  Loel Dubonnet, MS, OTR/L, CLT-LANA 01/28/23 12:29 PM  OUTPATIENT OCCUPATIONAL THERAPY TREATMENT NOTE  BILATERAL LOWER EXTREMITY LYMPHEDEMA  Patient Name: Sharon Ware MRN: [id number] DOB:08-09-1950, 72 y.o., female Today's Date: 01/28/2023   END OF SESSION:   OT End of Session - 01/28/23 1117     Visit Number 26    Number of Visits 36    Date for OT Re-Evaluation 03/24/23    OT Start Time 1105    OT Stop Time 1205    OT Time Calculation (min) 60 min    Activity Tolerance Patient tolerated treatment well;No increased pain    Behavior During Therapy WFL for tasks assessed/performed               Past Medical History:  Diagnosis Date   Aortic atherosclerosis (HCC)    Arthritis of both knees    Breast cancer (HCC)    Cataract    Genitourinary syndrome of menopause    Hiatal hernia    History of kidney stones    Hyperlipidemia    Hypertension    Long term current use of aromatase inhibitor    a.) letrozole   Long term current use of aspirin    Lymphedema    Malignant neoplasm of upper-inner quadrant of left breast in female, estrogen receptor positive (HCC)    Morbid obesity with BMI of 60.0-69.9,  adult (HCC)    Pneumonia    T2DM (type 2 diabetes mellitus) (HCC)    Thoracic ascending aortic aneurysm (HCC) 09/25/2022   a.) CT chest 09/25/2022: 4.2 cm   Umbilical hernia    Urinary tract infection due to ESBL Klebsiella    Vitamin D deficiency    Past Surgical History:  Procedure Laterality Date   ABDOMINAL HYSTERECTOMY  points to improve basic and instrumental ADLs performance, including lymphedema self-care.  Baseline: 33 % Goal status: ONGOING-PROGRESSING  3.  During Intensive phase CDT Pt will achieve at least 85% compliance with all lymphedema self-care home program components, including  daily skin care, multilayer , gradient compression wraps with daily changes, daily simple self MLD and daily lymphatic pumping therex to achieve optimal clinical outcome and to habituate self care regime for optimal LE self-management over time. Baseline: Dependent Goal status:11/05/22 GOAL MET  4.  Pt will achieve at least a 10% volume reductions bilaterally below the knees to return limb to more typical size and shape, to limit infection risk and LE progression, to decrease pain, to improve function, and to improve body image and QOL. Baseline: Dependent Goal status:11/05/22 GOAL MET for L LEG w 29% reduction to date.   12/29/22 GOAL MET for R LEG w 23.3% reduction achieved to date.  5.  Pt will be able to don and doff appropriate compression garments and devices using assistive devices and extra time and Max CG  assistance within 1 week of issue date for optimal lymphedema self-care. Baseline: Dependent Goal status: 12/27/22 GOAL MET PLAN:  OT FREQUENCY: 2 x/week; 01/20/23: Reduce OT to 1 x weekly while awaiting custom garment delivery  OT DURATION: 12  weeks other: and PRN  PLANNED INTERVENTIONS: Therapeutic exercises, Therapeutic activity, Patient/Family education, Self Care, DME instructions, Manual lymph drainage, Compression bandaging, Manual therapy, and skin care during MLD, fit with appropriate custom compression garments and devices, fit with appropriate compression device  which  follows lymphatic pathways and anatomic distribution  PLAN FOR NEXT SESSION:  Continue RLE/RLQ CDT including  RLE multilayer compression bandages RLE MLD w simultaneous skin care  Loel Dubonnet, MS, OTR/L, CLT-LANA 01/28/23 12:29 PM  OUTPATIENT OCCUPATIONAL THERAPY TREATMENT NOTE  BILATERAL LOWER EXTREMITY LYMPHEDEMA  Patient Name: Sharon Ware MRN: [id number] DOB:08-09-1950, 72 y.o., female Today's Date: 01/28/2023   END OF SESSION:   OT End of Session - 01/28/23 1117     Visit Number 26    Number of Visits 36    Date for OT Re-Evaluation 03/24/23    OT Start Time 1105    OT Stop Time 1205    OT Time Calculation (min) 60 min    Activity Tolerance Patient tolerated treatment well;No increased pain    Behavior During Therapy WFL for tasks assessed/performed               Past Medical History:  Diagnosis Date   Aortic atherosclerosis (HCC)    Arthritis of both knees    Breast cancer (HCC)    Cataract    Genitourinary syndrome of menopause    Hiatal hernia    History of kidney stones    Hyperlipidemia    Hypertension    Long term current use of aromatase inhibitor    a.) letrozole   Long term current use of aspirin    Lymphedema    Malignant neoplasm of upper-inner quadrant of left breast in female, estrogen receptor positive (HCC)    Morbid obesity with BMI of 60.0-69.9,  adult (HCC)    Pneumonia    T2DM (type 2 diabetes mellitus) (HCC)    Thoracic ascending aortic aneurysm (HCC) 09/25/2022   a.) CT chest 09/25/2022: 4.2 cm   Umbilical hernia    Urinary tract infection due to ESBL Klebsiella    Vitamin D deficiency    Past Surgical History:  Procedure Laterality Date   ABDOMINAL HYSTERECTOMY

## 2023-02-01 ENCOUNTER — Ambulatory Visit: Payer: Medicare Other | Admitting: Occupational Therapy

## 2023-02-01 ENCOUNTER — Ambulatory Visit (INDEPENDENT_AMBULATORY_CARE_PROVIDER_SITE_OTHER): Payer: Medicare Other | Admitting: Family Medicine

## 2023-02-01 ENCOUNTER — Encounter: Payer: Self-pay | Admitting: Family Medicine

## 2023-02-01 VITALS — BP 121/87 | HR 109 | Ht 63.0 in | Wt 328.0 lb

## 2023-02-01 DIAGNOSIS — E118 Type 2 diabetes mellitus with unspecified complications: Secondary | ICD-10-CM | POA: Diagnosis not present

## 2023-02-01 DIAGNOSIS — I7121 Aneurysm of the ascending aorta, without rupture: Secondary | ICD-10-CM | POA: Diagnosis not present

## 2023-02-01 DIAGNOSIS — I2510 Atherosclerotic heart disease of native coronary artery without angina pectoris: Secondary | ICD-10-CM

## 2023-02-01 LAB — BAYER DCA HB A1C WAIVED: HB A1C (BAYER DCA - WAIVED): 7.3 % — ABNORMAL HIGH (ref 4.8–5.6)

## 2023-02-01 MED ORDER — RYBELSUS 7 MG PO TABS: 7.0000 mg | ORAL_TABLET | Freq: Every day | ORAL | 1 refills | Status: DC

## 2023-02-01 NOTE — Assessment & Plan Note (Signed)
Due for repeat CT in June. Will keep BP and cholesterol under good control. Continue to monitor.

## 2023-02-01 NOTE — Progress Notes (Signed)
BP 121/87   Pulse (!) 109   Ht 5\' 3"  (1.6 m)   Wt (!) 328 lb (148.8 kg)   SpO2 96%   BMI 58.10 kg/m    Subjective:    Patient ID: Sharon Ware, female    DOB: 08/31/1950, 72 y.o.   MRN: [id number]  HPI: Sharon Ware is a 72 y.o. female  Chief Complaint  Patient presents with   Diabetes   DIABETES Hypoglycemic episodes:no Polydipsia/polyuria: no Visual disturbance: no Chest pain: no Paresthesias: no Glucose Monitoring: yes  Accucheck frequency: Daily Taking Insulin?: no Blood Pressure Monitoring: not checking Retinal Examination: Up to Date Foot Exam: Not up to Date Diabetic Education: Completed Pneumovax: Up to Date Influenza: Up to Date Aspirin: yes  Relevant past medical, surgical, family and social history reviewed and updated as indicated. Interim medical history since our last visit reviewed. Allergies and medications reviewed and updated.  Review of Systems  Constitutional: Negative.   Respiratory: Negative.    Cardiovascular: Negative.   Gastrointestinal: Negative.   Musculoskeletal: Negative.   Neurological: Negative.   Psychiatric/Behavioral: Negative.      Per HPI unless specifically indicated above     Objective:    BP 121/87   Pulse (!) 109   Ht 5\' 3"  (1.6 m)   Wt (!) 328 lb (148.8 kg)   SpO2 96%   BMI 58.10 kg/m   Wt Readings from Last 3 Encounters:  02/01/23 (!) 328 lb (148.8 kg)  01/18/23 (!) 331 lb 3.2 oz (150.2 kg)  12/24/22 (!) 339 lb (153.8 kg)    Physical Exam Vitals and nursing note reviewed.  Constitutional:      General: She is not in acute distress.    Appearance: Normal appearance. She is obese. She is not ill-appearing, toxic-appearing or diaphoretic.  HENT:     Head: Normocephalic and atraumatic.     Right Ear: External ear normal.     Left Ear: External ear normal.     Nose: Nose normal.     Mouth/Throat:     Mouth: Mucous membranes are moist.     Pharynx: Oropharynx is clear.  Eyes:     General: No  scleral icterus.       Right eye: No discharge.        Left eye: No discharge.     Extraocular Movements: Extraocular movements intact.     Conjunctiva/sclera: Conjunctivae normal.     Pupils: Pupils are equal, round, and reactive to light.  Cardiovascular:     Rate and Rhythm: Normal rate and regular rhythm.     Pulses: Normal pulses.     Heart sounds: Normal heart sounds. No murmur heard.    No friction rub. No gallop.  Pulmonary:     Effort: Pulmonary effort is normal. No respiratory distress.     Breath sounds: Normal breath sounds. No stridor. No wheezing, rhonchi or rales.  Chest:     Chest wall: No tenderness.  Musculoskeletal:        General: Normal range of motion.     Cervical back: Normal range of motion and neck supple.  Skin:    General: Skin is warm and dry.     Capillary Refill: Capillary refill takes less than 2 seconds.     Coloration: Skin is not jaundiced or pale.     Findings: No bruising, erythema, lesion or rash.  Neurological:     General: No focal deficit present.     Mental Status:  She is alert and oriented to person, place, and time. Mental status is at baseline.  Psychiatric:        Mood and Affect: Mood normal.        Behavior: Behavior normal.        Thought Content: Thought content normal.        Judgment: Judgment normal.     Results for orders placed or performed in visit on 02/01/23  Bayer DCA Hb A1c Waived  Result Value Ref Range   HB A1C (BAYER DCA - WAIVED) 7.3 (H) 4.8 - 5.6 %      Assessment & Plan:   Problem List Items Addressed This Visit       Cardiovascular and Mediastinum   Aneurysm of ascending aorta without rupture [facility])    Due for repeat CT in June. Will keep BP and cholesterol under good control. Continue to monitor.         Endocrine   Controlled diabetes mellitus type 2 with complications (HCC) - Primary    Doing well with A1c of 7.3. Continue current regimen. Continue to monitor. Call with any concerns.        Relevant Orders   Bayer DCA Hb A1c Waived (Completed)   Other Visit Diagnoses     Coronary artery calcification       Found incidently on CT. Will get her into cardiology. Call with any concerns.        Follow up plan: Return in about 3 months (around 05/04/2023).

## 2023-02-01 NOTE — Assessment & Plan Note (Signed)
Doing well with A1c of 7.3. Continue current regimen. Continue to monitor. Call with any concerns.

## 2023-02-03 ENCOUNTER — Ambulatory Visit: Payer: Medicare Other | Admitting: Occupational Therapy

## 2023-02-08 ENCOUNTER — Ambulatory Visit: Payer: Medicare Other | Admitting: Occupational Therapy

## 2023-02-10 ENCOUNTER — Encounter: Payer: Self-pay | Admitting: Occupational Therapy

## 2023-02-10 ENCOUNTER — Ambulatory Visit: Payer: Medicare Other | Attending: Infectious Diseases | Admitting: Occupational Therapy

## 2023-02-10 DIAGNOSIS — I890 Lymphedema, not elsewhere classified: Secondary | ICD-10-CM | POA: Insufficient documentation

## 2023-02-10 NOTE — Therapy (Signed)
OUTPATIENT OCCUPATIONAL THERAPY TREATMENT NOTE  BILATERAL LOWER EXTREMITY LYMPHEDEMA  Patient Name: Sharon Ware MRN: [id number] DOB:November 17, 1950, 72 y.o., female Today's Date: 02/10/2023   END OF SESSION:   OT End of Session - 02/10/23 1115     Visit Number 27    Number of Visits 36    Date for OT Re-Evaluation 03/24/23    OT Start Time 1105    OT Stop Time 1210    OT Time Calculation (min) 65 min    Activity Tolerance Patient tolerated treatment well;No increased pain    Behavior During Therapy WFL for tasks assessed/performed               Past Medical History:  Diagnosis Date   Aortic atherosclerosis (HCC)    Arthritis of both knees    Breast cancer (HCC)    Cataract    Genitourinary syndrome of menopause    Hiatal hernia    History of kidney stones    Hyperlipidemia    Hypertension    Long term current use of aromatase inhibitor    a.) letrozole   Long term current use of aspirin    Lymphedema    Malignant neoplasm of upper-inner quadrant of left breast in female, estrogen receptor positive (HCC)    Morbid obesity with BMI of 60.0-69.9, adult (HCC)    Pneumonia    T2DM (type 2 diabetes mellitus) (HCC)    Thoracic ascending aortic aneurysm (HCC) 09/25/2022   a.) CT chest 09/25/2022: 4.2 cm   Umbilical hernia    Urinary tract infection due to ESBL Klebsiella    Vitamin D deficiency    Past Surgical History:  Procedure Laterality Date   ABDOMINAL HYSTERECTOMY     x2   BREAST BIOPSY Right 1990s   neg   BREAST BIOPSY Right 2000s   neg   BREAST BIOPSY Left 11/24/2020   u/s bx 10:30/8 cmfn-"heart" marker   BREAST EXCISIONAL BIOPSY Right    neg   BREAST LUMPECTOMY     BREAST LUMPECTOMY WITH RADIOFREQUENCY TAG IDENTIFICATION Left 12/10/2020   Procedure: BREAST LUMPECTOMY WITH RADIOFREQUENCY TAG IDENTIFICATION;  Surgeon: Campbell Lerner, MD;  Location: [facility];  Service: General;  Laterality: Left;   COLONOSCOPY     CYSTOSCOPY/URETEROSCOPY/HOLMIUM  LASER/STENT PLACEMENT Right 12/10/2022   Procedure: CYSTOSCOPY/URETEROSCOPY/HOLMIUM LASER/STENT PLACEMENT;  Surgeon: Sondra Come, MD;  Location: [facility];  Service: Urology;  Laterality: Right;   DILATION AND CURETTAGE OF UTERUS     EYE SURGERY  2004   cataract surgery and laser surgery   Salpingo-oophorectomy     Patient Active Problem List   Diagnosis Date Noted   Aneurysm of ascending aorta without rupture (HCC) 02/01/2023   Kidney stones 12/08/2022   Lymphedema 09/25/2022   Hypoxia 09/25/2022   Edema 01/30/2021   Malignant neoplasm of upper-inner quadrant of left breast in female, estrogen receptor positive (HCC) 12/01/2020   Goals of care, counseling/discussion 12/01/2020   Morbid obesity (HCC) 08/08/2018   BMI 60.0-69.9, adult (HCC) 08/08/2018   Controlled diabetes mellitus type 2 with complications (HCC) 08/08/2018   Hyperlipidemia 08/08/2018   Hypertension associated with diabetes (HCC) 08/08/2018   Arthritis of both knees 08/08/2018   Vitamin D deficiency 08/08/2018   PCP: Olevia Perches, DO  REFERRING PROVIDER: Gwynn Burly, DO  REFERRING DIAG: I89.0  THERAPY DIAG:  Lymphedema, not elsewhere classified [I89.0]  Rationale for Evaluation and Treatment: Rehabilitation  ONSET DATE: 1 yr ago without known precipitating event- by report  SUBJECTIVE:  SUBJECTIVE STATEMENT: [name] presents for OT treatment to address BLE lymphedema, L>R. Pt is accompanied by her spouse, Sharon Ware, who assists her with transfers and with compression bandaging. Pt denies LE-related leg pain. She arrives seated in a transport wheelchair with compression wraps in place on her R leg , and custom compression stocking in place on her L LEG. Pt denies LE related pain. She reports that she still has not received  her custom RLE garments or devices from DME vendor. Pt's spouse called and they denied receiving garment order. OT is sure this is incorrect as we discussed the order on the phone and that vendor sent incorrect colors initially. OT resent order via email scan for the 3rd time after session requesting confirmation of receipt.  Tobacco Use: Low Risk  (02/10/2023)   Patient History    Smoking Tobacco Use: Never    Smokeless Tobacco Use: Never    Passive Exposure: Never   PERTINENT HISTORY: HTN, Obesity w BMI 60-69.9, DM, B knee arthritis, Hx L breast cancer, recurrent L leg cellulitis. MRSA test results pending  PAIN: Are you having pain? Denies LE related pain. Endorses OA knee pain and kidney stone -related pain  PRECAUTIONS: Fall and Other: lymphedema precautions, DM. Hx L Br Ca  PRIOR LEVEL OF FUNCTION: Independent with household mobility with device, Requires assistive device for independence, Needs assistance with homemaking, Needs assistance with gait, and Needs assistance with transfers  PATIENT GOALS: ... Get improvement for my condition...., keep lymphedema from getting worse, and reduce infection risk"  LYMPHEDEMA ASSESSMENTS:  FOTO Functional Outcomes Measure: Initial 33%  Lymphedema Life Impact Scale (LLIS): 22.06%  BLE COMPARATIVE LIMB VOLUMETRICS Initial 09/14/22  LANDMARK RIGHT    R LEG (A-D) 7717.1 ml  R THIGH (E-G) ml  R FULL LIMB (A-G) ml  Limb Volume differential (LVD)  %  Volume change since initial %  Volume change overall V  (Blank rows = not tested)  LANDMARK LEFT   L LEG (A-D) 7752.1 ml  L THIGH (E-G) ml  L FULL LIMB (A-G) ml  Limb Volume differential (LVD)  Leg LVD measures 0.5%, L>R   Volume change since initial %  Volume change overall %   (Blank rows = not tested)    LLE COMPARATIVE LIMB VOLUMETRICS 9th visit 11/05/22  LANDMARK LEFT   L LEG (A-D) 5445.7 ml  L THIGH (E-G) ml  L FULL LIMB (A-G) ml  Limb Volume differential (LVD)    Volume change  since initial  L LEG reduced in volume by 29.43% since 09/14/22  Volume change overall %  (Blank rows = not tested)   LLE COMPARATIVE LIMB VOLUMETRICS 16 th visit 12/08/22  LANDMARK LEFT   L LEG (A-D) 5445.7 ml  L THIGH (E-G) ml  L FULL LIMB (A-G) ml  Limb Volume differential (LVD)    Volume change since initial  L LEG is INCREASED in volume by 2.36% today, which is essentially stable since last measured on 11/05/22  Volume change overall  L LEG reduced in volume by 29.43% since 09/14/22   R LE COMPARATIVE LIMB VOLUMETRICS   19th visit 12/27/22   [facility] [place] RIGHT    R LEG (A-D) 5951.1 ml  R THIGH (E-G) ml  R FULL LIMB (A-G) ml  Limb Volume differential (LVD)  %  Volume change since initial R LEG volume is reduced  by 23.3% since initially measured on 09/14/22.  Volume change overall V  (Blank rows = not tested)  TODAY'S TREATMENT:  MLD with simultaneous skin care to limit infection risk. Knee length RLE multilayer compression wraps- as established PT and family edu for simple self MLD   PATIENT EDUCATION:  Continued Pt/ CG edu for lymphedema self care home program throughout session. Topics include outcome of comparative limb volumetrics- starting limb volume differentials (LVDs), technology and gradient techniques used for short stretch, multilayer compression wrapping, simple self-MLD, therapeutic lymphatic pumping exercises, skin/nail care, LE precautions,. compression garment recommendations and specifications, wear and care schedule and compression garment donning / doffing w assistive devices. Discussed progress towards all OT goals since commencing CDT. All questions answered to the Pt's satisfaction. Good return. Person educated: Patient and Spouse Education method: Explanation, Demonstration, and Handouts Education comprehension: verbalized  understanding, returned demonstration, verbal cues required, and needs further education  Lymphedema HOME PROGRAM:Complete Decongestive Therapy (CDT) Self-Management Phase Daily simple self-MLD Daily BLE skin care and inspection Daily lymphatic pumping there ex Appropriate daily compression- knee length, will require max caregiver A to  and doff Leg elevation when seated  ASSESSMENT: CLINICAL IMPRESSION: Session focussed on reviewing simple self MLD rational and techniques. Spouse able to perform J stroke without difficulty and performed short neck sequence with min assist by end of session. OT out of MLD handouts , so I'll mail one ASAP for reference. Pt and spouse agree  to call and schedule fitting follow up once garments arrive. They will call PRN during interval.   with neck sequence.  OBJECTIVE IMPAIRMENTS: Abnormal gait, decreased activity tolerance, decreased balance, decreased endurance, decreased knowledge of condition, decreased knowledge of use of DME, decreased mobility, difficulty walking, decreased ROM, decreased strength, increased edema, impaired perceived functional ability, postural dysfunction, obesity, pain, and chronic progressive limb swelling with elevated infection risk and risk of non-healing wounds .   ACTIVITY LIMITATIONS: carrying, lifting, bending, sitting, standing, squatting, sleeping, stairs, transfers, bed mobility, bathing, toileting, dressing, hygiene/grooming, and caring for others  PARTICIPATION LIMITATIONS: meal prep, cleaning, laundry, driving, shopping, community activity, occupation, yard work, and church  PERSONAL FACTORS: 3+ comorbidities: HTN, Stage III Obesity, B knee arthritis pain and inflammation and impaired AROM 2/2 body habitus  are also affecting patient's functional outcome.   REHAB POTENTIAL:  Fair with daily CG assistance throughout Intensive Phase CDT.  Poor without daily CG assistance with all LE self care home program  components  GOALS: Goals reviewed with patient? Yes  SHORT TERM GOALS: Target date: 4th OT Rx visit   Pt will demonstrate understanding of lymphedema precautions and prevention strategies with modified independence using a printed reference to identify at least 5 precautions and discussing how s/he may implement them into daily life to reduce risk of progression with modified assistance ( printed reference). Baseline: modified independence ( printed reference) Goal status: 11/05/22 GOAL MET  2.  Pt will be able to apply multilayer, thigh length, compression wraps using gradient techniques with Max caregiver assistance to decrease limb volume, to limit infection risk, and to limit lymphedema progression.  Baseline: Max A Goal status: 11/05/22 GOAL MET  LONG TERM GOALS: Target date: 12/06/22  Given this patient's Intake score of 22.06 % on the Lymphedema Life Impact Scale (LLIS), patient will experience a reduction of at least 5 points in her perceived level of functional impairment resulting from lymphedema to improve functional performance and quality of life (QOL). Baseline: 22.06 % Goal status ONGOING-PROGRESSING  2.  Given this patient's Intake score of 33 % on the functional outcomes FOTO tool, patient will experience an increase in function  of 3 points to improve basic and instrumental ADLs performance, including lymphedema self-care.  Baseline: 33 % Goal status: ONGOING-PROGRESSING  3.  During Intensive phase CDT Pt will achieve at least 85% compliance with all lymphedema self-care home program components, including  daily skin care, multilayer , gradient compression wraps with daily changes, daily simple self MLD and daily lymphatic pumping therex to achieve optimal clinical outcome and to habituate self care regime for optimal LE self-management over time. Baseline: Dependent Goal status:11/05/22 GOAL MET  4.  Pt will achieve at least a 10% volume reductions bilaterally below the knees  to return limb to more typical size and shape, to limit infection risk and LE progression, to decrease pain, to improve function, and to improve body image and QOL. Baseline: Dependent Goal status:11/05/22 GOAL MET for L LEG w 29% reduction to date.   12/29/22 GOAL MET for R LEG w 23.3% reduction achieved to date.  5.  Pt will be able to don and doff appropriate compression garments and devices using assistive devices and extra time and Max CG assistance within 1 week of issue date for optimal lymphedema self-care. Baseline: Dependent Goal status: 12/27/22 GOAL MET PLAN:  OT FREQUENCY: 2 x/week; 01/20/23: Reduce OT to 1 x weekly while awaiting custom garment delivery  OT DURATION: 12  weeks other: and PRN  PLANNED INTERVENTIONS: Therapeutic exercises, Therapeutic activity, Patient/Family education, Self Care, DME instructions, Manual lymph drainage, Compression bandaging, Manual therapy, and skin care during MLD, fit with appropriate custom compression garments and devices, fit with appropriate compression device  which  follows lymphatic pathways and anatomic distribution  PLAN FOR NEXT SESSION:  Continue RLE/RLQ CDT including  RLE multilayer compression bandages RLE MLD w simultaneous skin care  Loel Dubonnet, MS, OTR/L, CLT-LANA 02/10/23 12:52 PM  OUTPATIENT OCCUPATIONAL THERAPY TREATMENT NOTE  BILATERAL LOWER EXTREMITY LYMPHEDEMA  Patient Name: Sharon Ware MRN: [id number] DOB:1950-06-30, 72 y.o., female Today's Date: 02/10/2023   END OF SESSION:   OT End of Session - 02/10/23 1115     Visit Number 27    Number of Visits 36    Date for OT Re-Evaluation 03/24/23    OT Start Time 1105    OT Stop Time 1210    OT Time Calculation (min) 65 min    Activity Tolerance Patient tolerated treatment well;No increased pain    Behavior During Therapy WFL for tasks assessed/performed               Past Medical History:  Diagnosis Date   Aortic atherosclerosis (HCC)     Arthritis of both knees    Breast cancer (HCC)    Cataract    Genitourinary syndrome of menopause    Hiatal hernia    History of kidney stones    Hyperlipidemia    Hypertension    Long term current use of aromatase inhibitor    a.) letrozole   Long term current use of aspirin    Lymphedema    Malignant neoplasm of upper-inner quadrant of left breast in female, estrogen receptor positive (HCC)    Morbid obesity with BMI of 60.0-69.9, adult (HCC)    Pneumonia    T2DM (type 2 diabetes mellitus) (HCC)    Thoracic ascending aortic aneurysm (HCC) 09/25/2022   a.) CT chest 09/25/2022: 4.2 cm   Umbilical hernia    Urinary tract infection due to ESBL Klebsiella    Vitamin D deficiency    Past Surgical History:  Procedure Laterality Date  ABDOMINAL HYSTERECTOMY     x2   BREAST BIOPSY Right 1990s   neg   BREAST BIOPSY Right 2000s   neg   BREAST BIOPSY Left 11/24/2020   u/s bx 10:30/8 cmfn-"heart" marker   BREAST EXCISIONAL BIOPSY Right    neg   BREAST LUMPECTOMY     BREAST LUMPECTOMY WITH RADIOFREQUENCY TAG IDENTIFICATION Left 12/10/2020   Procedure: BREAST LUMPECTOMY WITH RADIOFREQUENCY TAG IDENTIFICATION;  Surgeon: Campbell Lerner, MD;  Location: [facility];  Service: General;  Laterality: Left;   COLONOSCOPY     CYSTOSCOPY/URETEROSCOPY/HOLMIUM LASER/STENT PLACEMENT Right 12/10/2022   Procedure: CYSTOSCOPY/URETEROSCOPY/HOLMIUM LASER/STENT PLACEMENT;  Surgeon: Sondra Come, MD;  Location: [facility];  Service: Urology;  Laterality: Right;   DILATION AND CURETTAGE OF UTERUS     EYE SURGERY  2004   cataract surgery and laser surgery   Salpingo-oophorectomy     Patient Active Problem List   Diagnosis Date Noted   Aneurysm of ascending aorta without rupture (HCC) 02/01/2023   Kidney stones 12/08/2022   Lymphedema 09/25/2022   Hypoxia 09/25/2022   Edema 01/30/2021   Malignant neoplasm of upper-inner quadrant of left breast in female, estrogen receptor positive (HCC) 12/01/2020    Goals of care, counseling/discussion 12/01/2020   Morbid obesity (HCC) 08/08/2018   BMI 60.0-69.9, adult (HCC) 08/08/2018   Controlled diabetes mellitus type 2 with complications (HCC) 08/08/2018   Hyperlipidemia 08/08/2018   Hypertension associated with diabetes (HCC) 08/08/2018   Arthritis of both knees 08/08/2018   Vitamin D deficiency 08/08/2018   PCP: Olevia Perches, DO  REFERRING PROVIDER: Gwynn Burly, DO  REFERRING DIAG: I89.0  THERAPY DIAG:  Lymphedema, not elsewhere classified [I89.0]  Rationale for Evaluation and Treatment: Rehabilitation  ONSET DATE: 1 yr ago without known precipitating event- by report  SUBJECTIVE:                                                                                                                                                                                           SUBJECTIVE STATEMENT: Giabella Duhart presents for OT treatment to address BLE lymphedema, L>R. Pt is accompanied by her spouse, Sharon Ware, who assists her with transfers and with compression bandaging. Pt denies LE-related leg pain. She arrives seated in a transport wheelchair with compression wraps in place on her R leg , and custom compression stocking in place on her L LEG.  Tobacco Use: Low Risk  (02/10/2023)   Patient History    Smoking Tobacco Use: Never    Smokeless Tobacco Use: Never    Passive Exposure: Never   PERTINENT HISTORY: HTN, Obesity w BMI 60-69.9, DM, B knee arthritis, Hx L  breast cancer, recurrent L leg cellulitis. MRSA test results pending  PAIN: Are you having pain? Denies LE related pain. Endorses OA knee pain and kidney stone -related pain  PRECAUTIONS: Fall and Other: lymphedema precautions, DM. Hx L Br Ca  PRIOR LEVEL OF FUNCTION: Independent with household mobility with device, Requires assistive device for independence, Needs assistance with homemaking, Needs assistance with gait, and Needs assistance with transfers  PATIENT GOALS: ... Get  improvement for my condition...., keep lymphedema from getting worse, and reduce infection risk"  LYMPHEDEMA ASSESSMENTS:  FOTO Functional Outcomes Measure: Initial 33%  Lymphedema Life Impact Scale (LLIS): 22.06%  BLE COMPARATIVE LIMB VOLUMETRICS Initial 09/14/22  LANDMARK RIGHT    R LEG (A-D) 7717.1 ml  R THIGH (E-G) ml  R FULL LIMB (A-G) ml  Limb Volume differential (LVD)  %  Volume change since initial %  Volume change overall V  (Blank rows = not tested)  LANDMARK LEFT   L LEG (A-D) 7752.1 ml  L THIGH (E-G) ml  L FULL LIMB (A-G) ml  Limb Volume differential (LVD)  Leg LVD measures 0.5%, L>R   Volume change since initial %  Volume change overall %   (Blank rows = not tested)    LLE COMPARATIVE LIMB VOLUMETRICS 9th visit 11/05/22  LANDMARK LEFT   L LEG (A-D) 5445.7 ml  L THIGH (E-G) ml  L FULL LIMB (A-G) ml  Limb Volume differential (LVD)    Volume change since initial  L LEG reduced in volume by 29.43% since 09/14/22  Volume change overall %  (Blank rows = not tested)   LLE COMPARATIVE LIMB VOLUMETRICS 16 th visit 12/08/22  LANDMARK LEFT   L LEG (A-D) 5445.7 ml  L THIGH (E-G) ml  L FULL LIMB (A-G) ml  Limb Volume differential (LVD)    Volume change since initial  L LEG is INCREASED in volume by 2.36% today, which is essentially stable since last measured on 11/05/22  Volume change overall  L LEG reduced in volume by 29.43% since 09/14/22   R LE COMPARATIVE LIMB VOLUMETRICS   19 th visit 12/27/22   LANDMARK RIGHT    R LEG (A-D) 5951.1 ml  R THIGH (E-G) ml  R FULL LIMB (A-G) ml  Limb Volume differential (LVD)  %  Volume change since initial R LEG volume is reduced  by 23.3% since initially measured on 09/14/22.  Volume change overall V  (Blank rows = not tested)  TODAY'S TREATMENT:                                                                                                                                         Knee length RLE multilayer compression wraps- as  established MLD as established to RLE- simultaneous skin care Pt/ family edu  PATIENT EDUCATION:  Continued Pt/ CG edu for lymphedema self care home program throughout session. Topics include outcome  of comparative limb volumetrics- starting limb volume differentials (LVDs), technology and gradient techniques used for short stretch, multilayer compression wrapping, simple self-MLD, therapeutic lymphatic pumping exercises, skin/nail care, LE precautions,. compression garment recommendations and specifications, wear and care schedule and compression garment donning / doffing w assistive devices. Discussed progress towards all OT goals since commencing CDT. All questions answered to the Pt's satisfaction. Good return. Person educated: Patient and Spouse Education method: Explanation, Demonstration, and Handouts Education comprehension: verbalized understanding, returned demonstration, verbal cues required, and needs further education  Lymphedema HOME PROGRAM:Complete Decongestive Therapy (CDT) Self-Management Phase Daily simple self-MLD Daily BLE skin care and inspection Daily lymphatic pumping there ex Appropriate daily compression- knee length, will require max caregiver A to  and doff Leg elevation when seated  ASSESSMENT: CLINICAL IMPRESSION: Pt tolerated RLE MLD with simultaneous skin care as established without increased pain. Hyperkeratosis is slow to respond to CDT on this side. Pt advised to continue with daily skin care regime and not to try to flake skin overgrowth patches off to limit infection risk. Skin is much better hydrated overall, but some mild dryness persists. Reapplied multilayer wraps using gradient techniques as established. Cont as per POC.  OBJECTIVE IMPAIRMENTS: Abnormal gait, decreased activity tolerance, decreased balance, decreased endurance, decreased knowledge of condition, decreased knowledge of use of DME, decreased mobility, difficulty walking, decreased ROM,  decreased strength, increased edema, impaired perceived functional ability, postural dysfunction, obesity, pain, and chronic progressive limb swelling with elevated infection risk and risk of non-healing wounds .   ACTIVITY LIMITATIONS: carrying, lifting, bending, sitting, standing, squatting, sleeping, stairs, transfers, bed mobility, bathing, toileting, dressing, hygiene/grooming, and caring for others  PARTICIPATION LIMITATIONS: meal prep, cleaning, laundry, driving, shopping, community activity, occupation, yard work, and church  PERSONAL FACTORS: 3+ comorbidities: HTN, Stage III Obesity, B knee arthritis pain and inflammation and impaired AROM 2/2 body habitus  are also affecting patient's functional outcome.   REHAB POTENTIAL:  Fair with daily CG assistance throughout Intensive Phase CDT.  Poor without daily CG assistance with all LE self care home program components  GOALS: Goals reviewed with patient? Yes  SHORT TERM GOALS: Target date: 4th OT Rx visit   Pt will demonstrate understanding of lymphedema precautions and prevention strategies with modified independence using a printed reference to identify at least 5 precautions and discussing how s/he may implement them into daily life to reduce risk of progression with modified assistance ( printed reference). Baseline: modified independence ( printed reference) Goal status: 11/05/22 GOAL MET  2.  Pt will be able to apply multilayer, thigh length, compression wraps using gradient techniques with Max caregiver assistance to decrease limb volume, to limit infection risk, and to limit lymphedema progression.  Baseline: Max A Goal status: 11/05/22 GOAL MET  LONG TERM GOALS: Target date: 12/06/22  Given this patient's Intake score of 22.06 % on the Lymphedema Life Impact Scale (LLIS), patient will experience a reduction of at least 5 points in her perceived level of functional impairment resulting from lymphedema to improve functional  performance and quality of life (QOL). Baseline: 22.06 % Goal status ONGOING-PROGRESSING  2.  Given this patient's Intake score of 33 % on the functional outcomes FOTO tool, patient will experience an increase in function of 3 points to improve basic and instrumental ADLs performance, including lymphedema self-care.  Baseline: 33 % Goal status: ONGOING-PROGRESSING  3.  During Intensive phase CDT Pt will achieve at least 85% compliance with all lymphedema self-care home program components, including  daily skin  care, multilayer , gradient compression wraps with daily changes, daily simple self MLD and daily lymphatic pumping therex to achieve optimal clinical outcome and to habituate self care regime for optimal LE self-management over time. Baseline: Dependent Goal status:11/05/22 GOAL MET  4.  Pt will achieve at least a 10% volume reductions bilaterally below the knees to return limb to more typical size and shape, to limit infection risk and LE progression, to decrease pain, to improve function, and to improve body image and QOL. Baseline: Dependent Goal status:11/05/22 GOAL MET for L LEG w 29% reduction to date.   12/29/22 GOAL MET for R LEG w 23.3% reduction achieved to date.  5.  Pt will be able to don and doff appropriate compression garments and devices using assistive devices and extra time and Max CG assistance within 1 week of issue date for optimal lymphedema self-care. Baseline: Dependent Goal status: 12/27/22 GOAL MET PLAN:  PT FREQUENCY: 2 x/week  PT DURATION: 12  weeks other: and PRN  PLANNED INTERVENTIONS: Therapeutic exercises, Therapeutic activity, Patient/Family education, Self Care, DME instructions, Manual lymph drainage, Compression bandaging, Manual therapy, and skin care during MLD, fit with appropriate custom compression garments and devices, fit with appropriate compression device  which  follows lymphatic pathways and anatomic distribution  PLAN FOR NEXT SESSION:   Complete RLE compression measurements next session RLE multilayer compression bandages RLE MLD w simultaneous skin care  Loel Dubonnet, MS, OTR/L, CLT-LANA 02/10/23 12:52 PM =

## 2023-02-15 ENCOUNTER — Ambulatory Visit: Payer: Medicare Other | Admitting: Occupational Therapy

## 2023-02-17 ENCOUNTER — Ambulatory Visit: Payer: Medicare Other | Admitting: Occupational Therapy

## 2023-02-22 ENCOUNTER — Ambulatory Visit: Payer: Medicare Other | Admitting: Occupational Therapy

## 2023-02-24 ENCOUNTER — Ambulatory Visit: Payer: Medicare Other | Admitting: Occupational Therapy

## 2023-03-01 ENCOUNTER — Ambulatory Visit: Payer: Medicare Other | Admitting: Occupational Therapy

## 2023-03-02 ENCOUNTER — Other Ambulatory Visit: Payer: Medicare Other

## 2023-03-08 ENCOUNTER — Ambulatory Visit: Payer: Medicare Other | Admitting: Occupational Therapy

## 2023-03-10 ENCOUNTER — Ambulatory Visit: Payer: Medicare Other | Admitting: Occupational Therapy

## 2023-03-10 ENCOUNTER — Encounter: Payer: Self-pay | Admitting: Radiation Oncology

## 2023-03-10 ENCOUNTER — Ambulatory Visit
Admission: RE | Admit: 2023-03-10 | Discharge: 2023-03-10 | Disposition: A | Discharge status: Home / Self Care | Payer: Medicare Other | Source: Ambulatory Visit | Attending: Radiation Oncology | Admitting: Radiation Oncology

## 2023-03-10 VITALS — BP 137/81 | HR 78 | Temp 98.2°F | Resp 20

## 2023-03-10 DIAGNOSIS — Z79811 Long term (current) use of aromatase inhibitors: Secondary | ICD-10-CM | POA: Insufficient documentation

## 2023-03-10 DIAGNOSIS — Z923 Personal history of irradiation: Secondary | ICD-10-CM | POA: Diagnosis not present

## 2023-03-10 DIAGNOSIS — I890 Lymphedema, not elsewhere classified: Secondary | ICD-10-CM | POA: Insufficient documentation

## 2023-03-10 DIAGNOSIS — C50212 Malignant neoplasm of upper-inner quadrant of left female breast: Secondary | ICD-10-CM | POA: Diagnosis present

## 2023-03-10 DIAGNOSIS — Z170 Estrogen receptor positive status [ER+]: Secondary | ICD-10-CM | POA: Diagnosis not present

## 2023-03-10 NOTE — Progress Notes (Signed)
Radiation Oncology Follow up Note  Name: Sharon Ware   Date:   03/10/2023 MRN:  [id number] DOB: 09/22/1950    This 72 y.o. female presents to the clinic today for 2-year follow-up status post whole breast radiation to her left breast for stage Ia ER/PR positive base of mammary carcinoma.  REFERRING PROVIDER: Dorcas Carrow, DO  HPI: Patient is a 72 year old female now out over 2 years having pleated whole breast radiation to her left breast for stage Ia ER/PR positive invasive mammary carcinoma seen today in routine follow-up she is doing well recently had some problems with renal calculi which has resolved.  She does have lymphedema in her lower extremities she has a wrap on her right lower extremity.  She is somewhat confused that this may be related to radiation which I assured her is not related.  She is currently on.  Femara tolerating it well without side effect.  She had mammograms back in August which I have reviewed were BI-RADS 2 benign.  COMPLICATIONS OF TREATMENT: none  FOLLOW UP COMPLIANCE: keeps appointments   PHYSICAL EXAM:  BP 137/81   Pulse 78   Temp 98.2 F (36.8 C) (Tympanic)   Resp 20  Morbidly obese female wheelchair-bound in NAD.  Lungs are clear to A&P cardiac examination essentially unremarkable with regular rate and rhythm. No dominant mass or nodularity is noted in either breast in 2 positions examined. Incision is well-healed. No axillary or supraclavicular adenopathy is appreciated. Cosmetic result is excellent.  Well-developed well-nourished patient in NAD. HEENT reveals PERLA, EOMI, discs not visualized.  Oral cavity is clear. No oral mucosal lesions are identified. Neck is clear without evidence of cervical or supraclavicular adenopathy. Lungs are clear to A&P. Cardiac examination is essentially unremarkable with regular rate and rhythm without murmur rub or thrill. Abdomen is benign with no organomegaly or masses noted. Motor sensory and DTR levels are  equal and symmetric in the upper and lower extremities. Cranial nerves II through XII are grossly intact. Proprioception is intact. No peripheral adenopathy or edema is identified. No motor or sensory levels are noted. Crude visual fields are within normal range.  RADIOLOGY RESULTS: Mammograms reviewed compatible with above-stated findings  PLAN: Present time patient is now out over 2 years with no evidence of disease she continues close follow-up care with medical oncology.  I am going to turn follow-up care over to medical oncology I be happy to reevaluate the patient in time the future should that be indicated.  Patient is to call with any concerns.  I would like to take this opportunity to thank you for allowing me to participate in the care of your patient.Carmina Miller, MD

## 2023-03-15 ENCOUNTER — Ambulatory Visit: Payer: Medicare Other | Admitting: Occupational Therapy

## 2023-03-17 ENCOUNTER — Ambulatory Visit: Payer: Medicare Other | Admitting: Occupational Therapy

## 2023-03-22 ENCOUNTER — Ambulatory Visit: Payer: Medicare Other | Admitting: Occupational Therapy

## 2023-03-24 ENCOUNTER — Ambulatory Visit: Payer: Medicare Other | Admitting: Occupational Therapy

## 2023-03-29 ENCOUNTER — Ambulatory Visit: Payer: Medicare Other | Admitting: Occupational Therapy

## 2023-03-31 ENCOUNTER — Ambulatory Visit: Payer: Medicare Other | Admitting: Occupational Therapy

## 2023-04-11 ENCOUNTER — Ambulatory Visit: Payer: Medicare Other | Admitting: Occupational Therapy

## 2023-04-13 ENCOUNTER — Ambulatory Visit: Payer: Medicare Other | Attending: Infectious Diseases | Admitting: Occupational Therapy

## 2023-04-13 ENCOUNTER — Encounter: Payer: Self-pay | Admitting: Occupational Therapy

## 2023-04-13 DIAGNOSIS — I890 Lymphedema, not elsewhere classified: Secondary | ICD-10-CM | POA: Diagnosis present

## 2023-04-13 NOTE — Therapy (Signed)
 OUTPATIENT OCCUPATIONAL THERAPY TREATMENT NOTE and DISCHARGE SUMMARY  BILATERAL LOWER EXTREMITY LYMPHEDEMA  Patient Name: Sharon Ware MRN: [id number] DOB:11-15-1950, 73 y.o., female Today's Date: 04/13/2023   END OF SESSION:   OT End of Session - 04/13/23 1307     Visit Number 28    Number of Visits 36    Date for OT Re-Evaluation 04/13/23    OT Start Time 0105    OT Stop Time 0210    OT Time Calculation (min) 65 min    Activity Tolerance Patient tolerated treatment well;No increased pain    Behavior During Therapy WFL for tasks assessed/performed               Past Medical History:  Diagnosis Date   Aortic atherosclerosis (HCC)    Arthritis of both knees    Breast cancer (HCC)    Cataract    Genitourinary syndrome of menopause    Hiatal hernia    History of kidney stones    Hyperlipidemia    Hypertension    Long term current use of aromatase inhibitor    a.) letrozole    Long term current use of aspirin     Lymphedema    Malignant neoplasm of upper-inner quadrant of left breast in female, estrogen receptor positive (HCC)    Morbid obesity with BMI of 60.0-69.9, adult (HCC)    Pneumonia    T2DM (type 2 diabetes mellitus) (HCC)    Thoracic ascending aortic aneurysm (HCC) 09/25/2022   a.) CT chest 09/25/2022: 4.2 cm   Umbilical hernia    Urinary tract infection due to ESBL Klebsiella    Vitamin D  deficiency    Past Surgical History:  Procedure Laterality Date   ABDOMINAL HYSTERECTOMY     x2   BREAST BIOPSY Right 1990s   neg   BREAST BIOPSY Right 2000s   neg   BREAST BIOPSY Left 11/24/2020   u/s bx 10:30/8 cmfn-heart marker   BREAST EXCISIONAL BIOPSY Right    neg   BREAST LUMPECTOMY     BREAST LUMPECTOMY WITH RADIOFREQUENCY TAG IDENTIFICATION Left 12/10/2020   Procedure: BREAST LUMPECTOMY WITH RADIOFREQUENCY TAG IDENTIFICATION;  Surgeon: Lane Shope, MD;  Location: [facility];  Service: General;  Laterality: Left;   COLONOSCOPY      CYSTOSCOPY/URETEROSCOPY/HOLMIUM LASER/STENT PLACEMENT Right 12/10/2022   Procedure: CYSTOSCOPY/URETEROSCOPY/HOLMIUM LASER/STENT PLACEMENT;  Surgeon: Francisca Redell BROCKS, MD;  Location: [facility];  Service: Urology;  Laterality: Right;   DILATION AND CURETTAGE OF UTERUS     EYE SURGERY  2004   cataract surgery and laser surgery   Salpingo-oophorectomy     Patient Active Problem List   Diagnosis Date Noted   Aneurysm of ascending aorta without rupture (HCC) 02/01/2023   Kidney stones 12/08/2022   Lymphedema 09/25/2022   Hypoxia 09/25/2022   Edema 01/30/2021   Malignant neoplasm of upper-inner quadrant of left breast in female, estrogen receptor positive (HCC) 12/01/2020   Goals of care, counseling/discussion 12/01/2020   Morbid obesity (HCC) 08/08/2018   BMI 60.0-69.9, adult (HCC) 08/08/2018   Controlled diabetes mellitus type 2 with complications (HCC) 08/08/2018   Hyperlipidemia 08/08/2018   Hypertension associated with diabetes (HCC) 08/08/2018   Arthritis of both knees 08/08/2018   Vitamin D  deficiency 08/08/2018   PCP: Duwaine Louder, DO  REFERRING PROVIDER: Prentice Shutter, DO  REFERRING DIAG: I89.0  THERAPY DIAG:  Lymphedema, not elsewhere classified [I89.0]  Rationale for Evaluation and Treatment: Rehabilitation  ONSET DATE: 1 yr ago without known precipitating event- by report  SUBJECTIVE:                                                                                                                                                                                           SUBJECTIVE STATEMENT: Sharon Ware presents for OT treatment to address BLE lymphedema, L>R. Pt is accompanied by her spouse, Sharon Ware, who assists her with transfers and with her lymphedema self-care home program.  Pt denies LE-related leg pain. She arrives seated in a transport wheelchair with custom, ccl 1 ( 18-21 mmHg), flat knit , open toed compression stockings in place. She tells me she received the R  stocking recently and is interested in having fit and function checked in clinic today. Pt and spouse agree that overall her legs are doing well and they are diligentl;y managing lymphedema daily using tyools and strategies learned in OT for CDT.  Tobacco Use: Low Risk  (04/13/2023)   Patient History    Smoking Tobacco Use: Never    Smokeless Tobacco Use: Never    Passive Exposure: Never   PERTINENT HISTORY: HTN, Obesity w BMI 60-69.9, DM, B knee arthritis, Hx L breast cancer, recurrent L leg cellulitis. MRSA test results pending  PAIN: Are you having pain? Denies LE related pain. Endorses OA knee pain and kidney stone -related pain  PRECAUTIONS: Fall and Other: lymphedema precautions, DM. Hx L Br Ca  PRIOR LEVEL OF FUNCTION: Independent with household mobility with device, Requires assistive device for independence, Needs assistance with homemaking, Needs assistance with gait, and Needs assistance with transfers  PATIENT GOALS: ... Get improvement for my condition...., keep lymphedema from getting worse, and reduce infection risk  LYMPHEDEMA ASSESSMENTS:  FOTO Functional Outcomes Measure: Initial 33%  Lymphedema Life Impact Scale (LLIS): 22.06%  BLE COMPARATIVE LIMB VOLUMETRICS Initial 09/14/22  LANDMARK RIGHT    R LEG (A-D) 7717.1 ml  R THIGH (E-G) ml  R FULL LIMB (A-G) ml  Limb Volume differential (LVD)  %  Volume change since initial %  Volume change overall V  (Blank rows = not tested)  LANDMARK LEFT   L LEG (A-D) 7752.1 ml  L THIGH (E-G) ml  L FULL LIMB (A-G) ml  Limb Volume differential (LVD)  Leg LVD measures 0.5%, L>R   Volume change since initial %  Volume change overall %   (Blank rows = not tested)    LLE COMPARATIVE LIMB VOLUMETRICS 9th visit 11/05/22  LANDMARK LEFT   L LEG (A-D) 5445.7 ml  L THIGH (E-G) ml  L FULL LIMB (A-G) ml  Limb Volume differential (LVD)    Volume change since initial  L LEG reduced in  volume by 29.43% since 09/14/22  Volume change  overall %  (Blank rows = not tested)   LLE COMPARATIVE LIMB VOLUMETRICS 16 th visit 12/08/22  LANDMARK LEFT   L LEG (A-D) 5445.7 ml  L THIGH (E-G) ml  L FULL LIMB (A-G) ml  Limb Volume differential (LVD)    Volume change since initial  L LEG is INCREASED in volume by 2.36% today, which is essentially stable since last measured on 11/05/22  Volume change overall  L LEG reduced in volume by 29.43% since 09/14/22   R LE COMPARATIVE LIMB VOLUMETRICS   19th visit 12/27/22   [facility] RIGHT    R LEG (A-D) 5951.1 ml  R THIGH (E-G) ml  R FULL LIMB (A-G) ml  Limb Volume differential (LVD)  %  Volume change since initial R LEG volume is reduced  by 23.3% since initially measured on 09/14/22.  Volume change overall V  (Blank rows = not tested)  TODAY'S TREATMENT:                                                                                                                                         Assessment of fit and function of custom R knee length compression stocking PT and family edu for simple self MLD   PATIENT EDUCATION:  Continued Pt/ CG edu for lymphedema self care home program throughout session. Topics include outcome of comparative limb volumetrics- starting limb volume differentials (LVDs), technology and gradient techniques used for short stretch, multilayer compression wrapping, simple self-MLD, therapeutic lymphatic pumping exercises, skin/nail care, LE precautions,. compression garment recommendations and specifications, wear and care schedule and compression garment donning / doffing w assistive devices. Discussed progress towards all OT goals since commencing CDT. All questions answered to the Pt's satisfaction. Good return. Person educated: Patient and Spouse Education method: Explanation, Demonstration, and Handouts Education comprehension: verbalized understanding, returned demonstration, verbal cues required, and needs further education  Lymphedema HOME PROGRAM:Complete  Decongestive Therapy (CDT) Self-Management Phase Daily simple self-MLD Daily BLE skin care and inspection Daily lymphatic pumping there ex Appropriate daily compression- knee length, will require max caregiver A to  and doff Leg elevation when seated  ASSESSMENT: CLINICAL IMPRESSION: Final custom compression garment / device ordered from DME back in October was finally delivered to Pt 5 days ago. Fit and function must be assessed before manufacturer will  honor warranty and remake garment, if needed. Pt is wearing LLE custom garment daily with excellent management of leg swelling/. Pt has been wrapping the RLE for several months due to DME vendor's poor management of this order.Pt's spouse is concerned that garment is a little too short and he states he had some difficulty getting the garment positioned properly when donning. OT reviewed using friction gloves for manipulating fabric and positioning in a chair with shoiulders neutral and triceps positioned to give greatest mechanical advantave at shoulders and arms at  90 degrees with wrists straight. Garments do indeed fit well, but next time I believe I would add at least 1 cm length to the top edge in case of shrinkage with washing. Pt is happy with fit and feels they are holding swelling down well at present.   Pt demonstrates an excellent response to CDT with help from her supportive spouse. She has met all goals and exceeded many. She  has habituated lymphedema self-care into hger routine and has the skills and tools to manage this chronic condition going forward. Pt will call PRN.  Happy to support Pt's needs  with long term lymphedema management PRN. She'sll need garment replacementt in 3-6 months and ongoing. Pt is DC from OT for CDT this date.    with neck sequence.  OBJECTIVE IMPAIRMENTS: Abnormal gait, decreased activity tolerance, decreased balance, decreased endurance, decreased knowledge of condition, decreased knowledge of use of DME,  decreased mobility, difficulty walking, decreased ROM, decreased strength, increased edema, impaired perceived functional ability, postural dysfunction, obesity, pain, and chronic progressive limb swelling with elevated infection risk and risk of non-healing wounds .   ACTIVITY LIMITATIONS: carrying, lifting, bending, sitting, standing, squatting, sleeping, stairs, transfers, bed mobility, bathing, toileting, dressing, hygiene/grooming, and caring for others  PARTICIPATION LIMITATIONS: meal prep, cleaning, laundry, driving, shopping, community activity, occupation, yard work, and church  PERSONAL FACTORS: 3+ comorbidities: HTN, Stage III Obesity, B knee arthritis pain and inflammation and impaired AROM 2/2 body habitus  are also affecting patient's functional outcome.   REHAB POTENTIAL:  Fair with daily CG assistance throughout Intensive Phase CDT.  Poor without daily CG assistance with all LE self care home program components  GOALS: Goals reviewed with patient? Yes  SHORT TERM GOALS: Target date: 4th OT Rx visit   Pt will demonstrate understanding of lymphedema precautions and prevention strategies with modified independence using a printed reference to identify at least 5 precautions and discussing how s/he may implement them into daily life to reduce risk of progression with modified assistance ( printed reference). Baseline: modified independence ( printed reference) Goal status: 11/05/22 GOAL MET  2.  Pt will be able to apply multilayer, thigh length, compression wraps using gradient techniques with Max caregiver assistance to decrease limb volume, to limit infection risk, and to limit lymphedema progression.  Baseline: Max A Goal status: 11/05/22 GOAL MET  LONG TERM GOALS: Target date: 12/06/22  Given this patient's Intake score of 22.06 % on the Lymphedema Life Impact Scale (LLIS), patient will experience a reduction of at least 5 points in her perceived level of functional impairment  resulting from lymphedema to improve functional performance and quality of life (QOL). Baseline: 22.06 % Goal status ONGOING-PROGRESSING  2.  Given this patient's Intake score of 33 % on the functional outcomes FOTO tool, patient will experience an increase in function of 3 points to improve basic and instrumental ADLs performance, including lymphedema self-care.  Baseline: 33 % Goal status: ONGOING-PROGRESSING  3.  During Intensive phase CDT Pt will achieve at least 85% compliance with all lymphedema self-care home program components, including  daily skin care, multilayer , gradient compression wraps with daily changes, daily simple self MLD and daily lymphatic pumping therex to achieve optimal clinical outcome and to habituate self care regime for optimal LE self-management over time. Baseline: Dependent Goal status:11/05/22 GOAL MET  4.  Pt will achieve at least a 10% volume reductions bilaterally below the knees to return limb to more typical size and shape, to limit infection risk  and LE progression, to decrease pain, to improve function, and to improve body image and QOL. Baseline: Dependent Goal status:11/05/22 GOAL MET for L LEG w 29% reduction to date.   12/29/22 GOAL MET for R LEG w 23.3% reduction achieved to date.  5.  Pt will be able to don and doff appropriate compression garments and devices using assistive devices and extra time and Max CG assistance within 1 week of issue date for optimal lymphedema self-care. Baseline: Dependent Goal status: 12/27/22 GOAL MET PLAN:  OT FREQUENCY: Pt is DC from OT for lymphedema care. Most goals met. @ remaining goals ongoing. (2 x/week; 01/20/23: Reduce OT to 1 x weekly while awaiting custom garment delivery)  OT DURATION: Pt is DDC from OT for lymphedema care. Most goals met. @ remaining goals ongoing (12  weeks other: and Prn)  PLANNED INTERVENTIONS: Pt will call PRN.  Happy to support Pt's needs  with long term lymphedema management PRN.  She'sll need garment replacementt in 3-6 months and ongoing.  (Therapeutic exercises, Therapeutic activity, Patient/Family education, Self Care, DME instructions, Manual lymph drainage, Compression bandaging, Manual therapy, and skin care during MLD, fit with appropriate custom compression garments and devices, fit with appropriate compression device  which  follows lymphatic pathways and anatomic distribution)  PLAN FOR NEXT SESSION:   Zebedee Dec, MS, OTR/L, CLT-LANA 04/13/23 3:48 PM

## 2023-04-19 ENCOUNTER — Encounter: Payer: Self-pay | Admitting: Cardiovascular Disease

## 2023-04-19 ENCOUNTER — Ambulatory Visit: Payer: Medicare Other | Attending: Cardiovascular Disease | Admitting: Cardiovascular Disease

## 2023-04-19 VITALS — BP 132/81 | HR 86 | Ht 63.0 in | Wt 323.2 lb

## 2023-04-19 DIAGNOSIS — E782 Mixed hyperlipidemia: Secondary | ICD-10-CM | POA: Diagnosis not present

## 2023-04-19 DIAGNOSIS — I152 Hypertension secondary to endocrine disorders: Secondary | ICD-10-CM | POA: Diagnosis present

## 2023-04-19 DIAGNOSIS — I7121 Aneurysm of the ascending aorta, without rupture: Secondary | ICD-10-CM | POA: Diagnosis present

## 2023-04-19 DIAGNOSIS — I2510 Atherosclerotic heart disease of native coronary artery without angina pectoris: Secondary | ICD-10-CM | POA: Insufficient documentation

## 2023-04-19 DIAGNOSIS — E1159 Type 2 diabetes mellitus with other circulatory complications: Secondary | ICD-10-CM | POA: Diagnosis present

## 2023-04-19 MED ORDER — ATORVASTATIN CALCIUM 20 MG PO TABS: 20.0000 mg | ORAL_TABLET | Freq: Every day | ORAL | 3 refills | Status: DC

## 2023-04-19 NOTE — Progress Notes (Signed)
 Cardiology Office Note   Date:  04/19/2023   ID:  Sharon, Ware [birth year], MRN [id number]  PCP:  Vicci Duwaine SHAUNNA, DO  Cardiologist:   [name], MD   Chief Complaint  Patient presents with   New Patient (Initial Visit)    Aneurysm of ascending aorta/CAD pt would like to discuss CT scan. Meds reviewed verbally with pt.      History of Present Illness: Sharon Ware is a 73 y.o. female who was referred by Dr. Vicci for coronary artery calcifications. She has known history of breast cancer, type 2 diabetes, morbid obesity with a BMI greater than 50, essential hypertension and hyperlipidemia.  Breast cancer was treated with surgery and radiation therapy.  She was hospitalized in June with UTI and kidney stones.  She had CT chest without contrast in June 2024 which showed a 4.2 ascending thoracic aortic aneurysm with evidence of coronary calcifications.  She denies any chest pain or shortness of breath.  Her mobility is limited and she frequently uses a wheelchair but is able to walk with a walker.  Her balance is not great.  Past Medical History:  Diagnosis Date   Aortic atherosclerosis (HCC)    Arthritis of both knees    Breast cancer (HCC)    Cataract    Genitourinary syndrome of menopause    Hiatal hernia    History of kidney stones    Hyperlipidemia    Hypertension    Long term current use of aromatase inhibitor    a.) letrozole    Long term current use of aspirin     Lymphedema    Malignant neoplasm of upper-inner quadrant of left breast in female, estrogen receptor positive (HCC)    Morbid obesity with BMI of 60.0-69.9, adult (HCC)    Pneumonia    T2DM (type 2 diabetes mellitus) (HCC)    Thoracic ascending aortic aneurysm (HCC) 09/25/2022   a.) CT chest 09/25/2022: 4.2 cm   Umbilical hernia    Urinary tract infection due to ESBL Klebsiella    Vitamin D  deficiency     Past Surgical History:  Procedure Laterality Date   ABDOMINAL  HYSTERECTOMY     x2   BREAST BIOPSY Right 1990s   neg   BREAST BIOPSY Right 2000s   neg   BREAST BIOPSY Left 11/24/2020   u/s bx 10:30/8 cmfn-heart marker   BREAST EXCISIONAL BIOPSY Right    neg   BREAST LUMPECTOMY     BREAST LUMPECTOMY WITH RADIOFREQUENCY TAG IDENTIFICATION Left 12/10/2020   Procedure: BREAST LUMPECTOMY WITH RADIOFREQUENCY TAG IDENTIFICATION;  Surgeon: Lane Shope, MD;  Location: [facility] ORS;  Service: General;  Laterality: Left;   COLONOSCOPY     CYSTOSCOPY/URETEROSCOPY/HOLMIUM LASER/STENT PLACEMENT Right 12/10/2022   Procedure: CYSTOSCOPY/URETEROSCOPY/HOLMIUM LASER/STENT PLACEMENT;  Surgeon: Francisca Redell BROCKS, MD;  Location: [facility] ORS;  Service: Urology;  Laterality: Right;   DILATION AND CURETTAGE OF UTERUS     EYE SURGERY  2004   cataract surgery and laser surgery   Salpingo-oophorectomy       Current Outpatient Medications  Medication Sig Dispense Refill   acetaminophen  (TYLENOL ) 650 MG CR tablet Take 650 mg by mouth every 8 (eight) hours as needed for pain.     aspirin  81 MG tablet Take 81 mg by mouth daily.     Calcium  Citrate-Vitamin D  (CALCIUM  CITRATE + D3 PO) Take 1,200 mg by mouth daily.     Cholecalciferol (VITAMIN D3) 125 MCG (5000 UT) TABS Take 5,000 Units  by mouth daily.     CRANBERRY PO Take 1 capsule by mouth daily at 12 noon.     letrozole  (FEMARA ) 2.5 MG tablet Take 1 tablet (2.5 mg total) by mouth daily. 90 tablet 2   losartan -hydrochlorothiazide  (HYZAAR) 50-12.5 MG tablet Take 1 tablet by mouth daily. 90 tablet 1   lovastatin  (MEVACOR ) 20 MG tablet TAKE 1 TABLET BY MOUTH EVERYDAY AT BEDTIME 90 tablet 1   Menthol, Topical Analgesic, (ICY HOT EX) Apply 1 application topically daily as needed (pain).     metFORMIN  (GLUCOPHAGE -XR) 500 MG 24 hr tablet Take 2 tablets (1,000 mg total) by mouth 2 (two) times daily with a meal. 360 tablet 1   methenamine  (HIPREX ) 1 g tablet Take 1 tablet (1 g total) by mouth 2 (two) times daily with a meal. 60 tablet  11   naloxone (NARCAN) nasal spray 4 mg/0.1 mL      Omega-3 Fatty Acids (FISH OIL ULTRA) 1400 MG CAPS Take 1,400 mg by mouth daily.     Semaglutide  (RYBELSUS ) 7 MG TABS Take 1 tablet (7 mg total) by mouth daily. 90 tablet 1   spironolactone  (ALDACTONE ) 50 MG tablet Take 1 tablet (50 mg total) by mouth daily. 90 tablet 1   UNABLE TO FIND 3 (three) times a week. Probiotic Vaginal Pearls     No current facility-administered medications for this visit.    Allergies:   Other, Wound dressing adhesive, Ciprofloxacin , Morphine and codeine, Shrimp (diagnostic), and Latex    Social History:  The patient  reports that she has never smoked. She has never been exposed to tobacco smoke. She has never used smokeless tobacco. She reports that she does not currently use alcohol. She reports that she does not use drugs.   Family History:  The patient's family history includes Breast cancer (age of onset: 60) in her mother; Cancer in her paternal grandfather; Heart disease in her father and paternal grandfather.    ROS:  Please see the history of present illness.   Otherwise, review of systems are positive for none.   All other systems are reviewed and negative.    PHYSICAL EXAM: VS:  BP 132/81 (BP Location: Right Wrist, Cuff Size: Large)   Pulse 86   Ht 5' 3 (1.6 m)   Wt (!) 323 lb 4 oz (146.6 kg)   SpO2 98%   BMI 57.26 kg/m  , BMI Body mass index is 57.26 kg/m. GEN: Well nourished, well developed, in no acute distress  HEENT: normal  Neck: no JVD, carotid bruits, or masses Cardiac: RRR; no murmurs, rubs, or gallops,no edema  Respiratory:  clear to auscultation bilaterally, normal work of breathing GI: soft, nontender, nondistended, + BS MS: no deformity or atrophy  Skin: warm and dry, no rash Neuro:  Strength and sensation are intact Psych: euthymic mood, full affect   EKG:  EKG is ordered today. The ekg ordered today demonstrates : Normal sinus rhythm Left axis deviation Non-specific  intra-ventricular conduction block Nonspecific T wave abnormality When compared with ECG of 03-Dec-2022 11:24, Non-specific intra-ventricular conduction block has replaced Right bundle branch block      Recent Labs: 12/08/2022: ALT 23; BUN 24; Creatinine, Ser 1.01; Hemoglobin 14.7; Platelets 235; Potassium 4.3; Sodium 136    Lipid Panel    Component Value Date/Time   CHOL 156 11/01/2022 1044   TRIG 195 (H) 11/01/2022 1044   HDL 39 (L) 11/01/2022 1044   LDLCALC 84 11/01/2022 1044      Wt  Readings from Last 3 Encounters:  04/19/23 (!) 323 lb 4 oz (146.6 kg)  02/01/23 (!) 328 lb (148.8 kg)  01/18/23 (!) 331 lb 3.2 oz (150.2 kg)          04/19/2023    3:07 PM  PAD Screen  Previous PAD dx? No  Previous surgical procedure? No  Pain with walking? No  Feet/toe relief with dangling? No  Painful, non-healing ulcers? No  Extremities discolored? No      ASSESSMENT AND PLAN:  1.  Coronary atherosclerosis: Noted on CT scan.  Currently with no anginal symptoms.  Stress testing is limited by morbid obesity and inability to exercise on a treadmill.  Will hold off for now especially that she is not symptomatic. Instead, I recommend aggressive treatment of her risk factors.  2.  Ascending aortic aneurysm measuring 4.2 cm.  Likely close to the upper normal limits considering her age and body surface area.  I agree with repeat CT imaging in June to ensure stability.  Continue with blood pressure control.  3.  Hyperlipidemia: Given her risk factors, I think we have to be more aggressive lowering her LDL.  I elected to switch her from lovastatin  to atorvastatin .  Check lipid and liver profile in 2 months    Disposition:   FU with in our office in 6 months.  Signed,  [name], MD  04/19/2023 3:21 PM    [facility]

## 2023-04-19 NOTE — Patient Instructions (Signed)
 Medication Instructions:  STOP the Lovastatin   START the Atorvastatin  20 mg once daily  *If you need a refill on your cardiac medications before your next appointment, please call your pharmacy*   Lab Work: Your provider would like for you to return in 2 months to have the following labs drawn: fasting lipid and liver.   Please go to [facility] [address] [address], [place] [address] You do not need an appointment.  They are open from 8 am- 5 pm.  Lunch from 1:00 pm- 2:00 pm You will need to be fasting.   If you have labs (blood work) drawn today and your tests are completely normal, you will receive your results only by: MyChart Message (if you have MyChart) OR A paper copy in the mail If you have any lab test that is abnormal or we need to change your treatment, we will call you to review the results.   Testing/Procedures: None ordered   Follow-Up: At [facility], you and your health needs are our priority.  As part of our continuing mission to provide you with exceptional heart care, we have created designated Provider Care Teams.  These Care Teams include your primary Cardiologist (physician) and Advanced Practice Providers (APPs -  Physician Assistants and Nurse Practitioners) who all work together to provide you with the care you need, when you need it.  We recommend signing up for the patient portal called MyChart.  Sign up information is provided on this After Visit Summary.  MyChart is used to connect with patients for Virtual Visits (Telemedicine).  Patients are able to view lab/test results, encounter notes, upcoming appointments, etc.  Non-urgent messages can be sent to your provider as well.   To learn more about what you can do with MyChart, go to [email or web address].    Your next appointment:   6 month(s)  Provider:   You may see Dr. Darron or one of the following Advanced Practice Providers on your designated  Care Team:   Lonni Meager, NP Bernardino Bring, PA-C Cadence Franchester, PA-C Tylene Lunch, NP Barnie Hila, NP

## 2023-05-04 ENCOUNTER — Encounter: Payer: Self-pay | Admitting: Oncology

## 2023-05-04 ENCOUNTER — Encounter: Payer: Self-pay | Admitting: Family Medicine

## 2023-05-04 ENCOUNTER — Ambulatory Visit: Payer: Medicare Other | Admitting: Family Medicine

## 2023-05-04 ENCOUNTER — Other Ambulatory Visit: Payer: Self-pay

## 2023-05-04 VITALS — BP 136/79 | HR 80 | Temp 98.4°F | Wt 321.2 lb

## 2023-05-04 DIAGNOSIS — E118 Type 2 diabetes mellitus with unspecified complications: Secondary | ICD-10-CM

## 2023-05-04 DIAGNOSIS — E1159 Type 2 diabetes mellitus with other circulatory complications: Secondary | ICD-10-CM

## 2023-05-04 DIAGNOSIS — I152 Hypertension secondary to endocrine disorders: Secondary | ICD-10-CM

## 2023-05-04 DIAGNOSIS — N390 Urinary tract infection, site not specified: Secondary | ICD-10-CM

## 2023-05-04 DIAGNOSIS — E782 Mixed hyperlipidemia: Secondary | ICD-10-CM | POA: Diagnosis not present

## 2023-05-04 DIAGNOSIS — E559 Vitamin D deficiency, unspecified: Secondary | ICD-10-CM

## 2023-05-04 LAB — BAYER DCA HB A1C WAIVED: HB A1C (BAYER DCA - WAIVED): 6 % — ABNORMAL HIGH (ref 4.8–5.6)

## 2023-05-04 MED ORDER — LOSARTAN POTASSIUM-HCTZ 50-12.5 MG PO TABS: 1.0000 | ORAL_TABLET | Freq: Every day | ORAL | 1 refills | Status: DC

## 2023-05-04 MED ORDER — SPIRONOLACTONE 50 MG PO TABS: 50.0000 mg | ORAL_TABLET | Freq: Every day | ORAL | 1 refills | Status: DC

## 2023-05-04 MED ORDER — METFORMIN HCL ER 500 MG PO TB24: 1000.0000 mg | ORAL_TABLET | Freq: Two times a day (BID) | ORAL | 1 refills | Status: DC

## 2023-05-04 MED ORDER — LETROZOLE 2.5 MG PO TABS: 2.5000 mg | ORAL_TABLET | Freq: Every day | ORAL | 2 refills | Status: DC

## 2023-05-04 MED ORDER — RYBELSUS 14 MG PO TABS: 14.0000 mg | ORAL_TABLET | Freq: Every day | ORAL | 6 refills | Status: DC

## 2023-05-04 MED ORDER — METHENAMINE HIPPURATE 1 G PO TABS: 1.0000 g | ORAL_TABLET | Freq: Two times a day (BID) | ORAL | 11 refills | Status: DC

## 2023-05-04 NOTE — Assessment & Plan Note (Signed)
Under good control on current regimen. Continue current regimen. Continue to monitor. Call with any concerns. Refills up to date. Labs drawn today.

## 2023-05-04 NOTE — Assessment & Plan Note (Signed)
Rechecking labs today. Await results.

## 2023-05-04 NOTE — Assessment & Plan Note (Signed)
Under good control on current regimen. Continue current regimen. Continue to monitor. Call with any concerns. Refills given. Labs drawn today.

## 2023-05-04 NOTE — Assessment & Plan Note (Signed)
Congratulated patient on 8lbs weight loss! Continue diet and exercise. Will increase her rybelsus to 14mg . Recheck 3 months.

## 2023-05-04 NOTE — Progress Notes (Signed)
BP 136/79   Pulse 80   Temp 98.4 F (36.9 C) (Oral)   Wt (!) 321 lb 3.2 oz (145.7 kg)   SpO2 94%   BMI 56.90 kg/m    Subjective:    Patient ID: Sharon Ware, female    DOB: 01/03/1951, 73 y.o.   MRN: [id number]  HPI: Sharon Ware is a 73 y.o. female  Chief Complaint  Patient presents with   Diabetes   DIABETES Hypoglycemic episodes:no Polydipsia/polyuria: no Visual disturbance: no Chest pain: no Paresthesias: no Glucose Monitoring: no Taking Insulin?: no Blood Pressure Monitoring: a few times a month Retinal Examination: Up to Date Foot Exam: Up to Date Diabetic Education: Completed Pneumovax: Up to Date Influenza: Up to Date Aspirin: yes  HYPERTENSION / HYPERLIPIDEMIA Satisfied with current treatment? yes Duration of hypertension: chronic BP monitoring frequency: a few times a month BP medication side effects: no Past BP meds: losartan, hydrochlorothiazide, spironalactone Duration of hyperlipidemia: chronic Cholesterol medication side effects: no Cholesterol supplements: none Past cholesterol medications:atorvastatin Medication compliance: excellent compliance Aspirin: yes Recent stressors: no Recurrent headaches: no Visual changes: no Palpitations: no Dyspnea: no Chest pain: no Lower extremity edema: no Dizzy/lightheaded: no   Relevant past medical, surgical, family and social history reviewed and updated as indicated. Interim medical history since our last visit reviewed. Allergies and medications reviewed and updated.  Review of Systems  Constitutional: Negative.   Respiratory: Negative.    Cardiovascular: Negative.   Gastrointestinal: Negative.   Musculoskeletal: Negative.   Neurological: Negative.   Psychiatric/Behavioral: Negative.      Per HPI unless specifically indicated above     Objective:    BP 136/79   Pulse 80   Temp 98.4 F (36.9 C) (Oral)   Wt (!) 321 lb 3.2 oz (145.7 kg)   SpO2 94%   BMI 56.90 kg/m   Wt  Readings from Last 3 Encounters:  05/04/23 (!) 321 lb 3.2 oz (145.7 kg)  04/19/23 (!) 323 lb 4 oz (146.6 kg)  02/01/23 (!) 328 lb (148.8 kg)    Physical Exam Vitals and nursing note reviewed.  Constitutional:      General: She is not in acute distress.    Appearance: Normal appearance. She is obese. She is not ill-appearing, toxic-appearing or diaphoretic.  HENT:     Head: Normocephalic and atraumatic.     Right Ear: External ear normal.     Left Ear: External ear normal.     Nose: Nose normal.     Mouth/Throat:     Mouth: Mucous membranes are moist.     Pharynx: Oropharynx is clear.  Eyes:     General: No scleral icterus.       Right eye: No discharge.        Left eye: No discharge.     Extraocular Movements: Extraocular movements intact.     Conjunctiva/sclera: Conjunctivae normal.     Pupils: Pupils are equal, round, and reactive to light.  Cardiovascular:     Rate and Rhythm: Normal rate and regular rhythm.     Pulses: Normal pulses.     Heart sounds: Normal heart sounds. No murmur heard.    No friction rub. No gallop.  Pulmonary:     Effort: Pulmonary effort is normal. No respiratory distress.     Breath sounds: Normal breath sounds. No stridor. No wheezing, rhonchi or rales.  Chest:     Chest wall: No tenderness.  Musculoskeletal:        General:  Normal range of motion.     Cervical back: Normal range of motion and neck supple.  Skin:    General: Skin is warm and dry.     Capillary Refill: Capillary refill takes less than 2 seconds.     Coloration: Skin is not jaundiced or pale.     Findings: No bruising, erythema, lesion or rash.  Neurological:     General: No focal deficit present.     Mental Status: She is alert and oriented to person, place, and time. Mental status is at baseline.  Psychiatric:        Mood and Affect: Mood normal.        Behavior: Behavior normal.        Thought Content: Thought content normal.        Judgment: Judgment normal.      Results for orders placed or performed in visit on 02/01/23  Bayer DCA Hb A1c Waived   Collection Time: 02/01/23  9:50 AM  Result Value Ref Range   HB A1C (BAYER DCA - WAIVED) 7.3 (H) 4.8 - 5.6 %      Assessment & Plan:   Problem List Items Addressed This Visit       Cardiovascular and Mediastinum   Hypertension associated with diabetes (HCC)   Under good control on current regimen. Continue current regimen. Continue to monitor. Call with any concerns. Refills given. Labs drawn today.       Relevant Medications   Semaglutide (RYBELSUS) 14 MG TABS   spironolactone (ALDACTONE) 50 MG tablet   losartan-hydrochlorothiazide (HYZAAR) 50-12.5 MG tablet   metFORMIN (GLUCOPHAGE-XR) 500 MG 24 hr tablet   Other Relevant Orders   CBC with Differential/Platelet   Comprehensive metabolic panel     Endocrine   Controlled diabetes mellitus type 2 with complications (HCC) - Primary   Doing great with A1c of 6.0. Will increase rybelsus to help with continued weight loss. Call with any concerns. Recheck 3 months.       Relevant Medications   Semaglutide (RYBELSUS) 14 MG TABS   losartan-hydrochlorothiazide (HYZAAR) 50-12.5 MG tablet   metFORMIN (GLUCOPHAGE-XR) 500 MG 24 hr tablet   Other Relevant Orders   Bayer DCA Hb A1c Waived   CBC with Differential/Platelet   Comprehensive metabolic panel     Other   Morbid obesity (HCC)   Congratulated patient on 8lbs weight loss! Continue diet and exercise. Will increase her rybelsus to 14mg . Recheck 3 months.       Relevant Medications   Semaglutide (RYBELSUS) 14 MG TABS   metFORMIN (GLUCOPHAGE-XR) 500 MG 24 hr tablet   Hyperlipidemia   Under good control on current regimen. Continue current regimen. Continue to monitor. Call with any concerns. Refills up to date. Labs drawn today.      Relevant Medications   spironolactone (ALDACTONE) 50 MG tablet   losartan-hydrochlorothiazide (HYZAAR) 50-12.5 MG tablet   Other Relevant Orders    CBC with Differential/Platelet   Lipid Panel w/o Chol/HDL Ratio   Comprehensive metabolic panel   Vitamin D deficiency   Rechecking labs today. Await results.       Relevant Orders   VITAMIN D 25 Hydroxy (Vit-D Deficiency, Fractures)     Follow up plan: Return in about 3 months (around 08/02/2023).

## 2023-05-04 NOTE — Assessment & Plan Note (Signed)
Doing great with A1c of 6.0. Will increase rybelsus to help with continued weight loss. Call with any concerns. Recheck 3 months.

## 2023-05-05 LAB — COMPREHENSIVE METABOLIC PANEL
ALT: 18 [IU]/L (ref 0–32)
AST: 16 [IU]/L (ref 0–40)
Albumin: 4.1 g/dL (ref 3.8–4.8)
Alkaline Phosphatase: 58 [IU]/L (ref 44–121)
BUN/Creatinine Ratio: 17 (ref 12–28)
BUN: 12 mg/dL (ref 8–27)
Bilirubin Total: 0.5 mg/dL (ref 0.0–1.2)
CO2: 25 mmol/L (ref 20–29)
Calcium: 9.7 mg/dL (ref 8.7–10.3)
Chloride: 94 mmol/L — ABNORMAL LOW (ref 96–106)
Creatinine, Ser: 0.72 mg/dL (ref 0.57–1.00)
Globulin, Total: 2.8 g/dL (ref 1.5–4.5)
Glucose: 123 mg/dL — ABNORMAL HIGH (ref 70–99)
Potassium: 4.4 mmol/L (ref 3.5–5.2)
Sodium: 136 mmol/L (ref 134–144)
Total Protein: 6.9 g/dL (ref 6.0–8.5)
eGFR: 88 mL/min/{1.73_m2} (ref >59)

## 2023-05-05 LAB — CBC WITH DIFFERENTIAL/PLATELET
Basophils Absolute: 0 10*3/uL (ref 0.0–0.2)
Basos: 1 %
EOS (ABSOLUTE): 0.3 10*3/uL (ref 0.0–0.4)
Eos: 3 %
Hematocrit: 43.9 % (ref 34.0–46.6)
Hemoglobin: 14.7 g/dL (ref 11.1–15.9)
Immature Grans (Abs): 0.1 10*3/uL (ref 0.0–0.1)
Immature Granulocytes: 1 %
Lymphocytes Absolute: 3.8 10*3/uL — ABNORMAL HIGH (ref 0.7–3.1)
Lymphs: 44 %
MCH: 30.8 pg (ref 26.6–33.0)
MCHC: 33.5 g/dL (ref 31.5–35.7)
MCV: 92 fL (ref 79–97)
Monocytes Absolute: 0.5 10*3/uL (ref 0.1–0.9)
Monocytes: 6 %
Neutrophils Absolute: 4 10*3/uL (ref 1.4–7.0)
Neutrophils: 45 %
Platelets: 264 10*3/uL (ref 150–450)
RBC: 4.78 x10E6/uL (ref 3.77–5.28)
RDW: 12.4 % (ref 11.7–15.4)
WBC: 8.6 10*3/uL (ref 3.4–10.8)

## 2023-05-05 LAB — LIPID PANEL W/O CHOL/HDL RATIO
Cholesterol, Total: 116 mg/dL (ref 100–199)
HDL: 41 mg/dL (ref >39)
LDL Chol Calc (NIH): 51 mg/dL (ref 0–99)
Triglycerides: 138 mg/dL (ref 0–149)
VLDL Cholesterol Cal: 24 mg/dL (ref 5–40)

## 2023-05-05 LAB — VITAMIN D 25 HYDROXY (VIT D DEFICIENCY, FRACTURES): Vit D, 25-Hydroxy: 64.8 ng/mL (ref 30.0–100.0)

## 2023-05-11 ENCOUNTER — Encounter: Payer: Self-pay | Admitting: Family Medicine

## 2023-06-14 ENCOUNTER — Other Ambulatory Visit: Payer: Medicare Other

## 2023-06-15 ENCOUNTER — Other Ambulatory Visit: Payer: Medicare Other

## 2023-06-17 ENCOUNTER — Other Ambulatory Visit
Admission: RE | Admit: 2023-06-17 | Discharge: 2023-06-17 | Disposition: A | Discharge status: Home / Self Care | Attending: Cardiovascular Disease | Admitting: Cardiovascular Disease

## 2023-06-17 DIAGNOSIS — N289 Disorder of kidney and ureter, unspecified: Secondary | ICD-10-CM | POA: Insufficient documentation

## 2023-06-17 LAB — BASIC METABOLIC PANEL
Anion gap: 12 (ref 5–15)
BUN: 17 mg/dL (ref 8–23)
CO2: 26 mmol/L (ref 22–32)
Calcium: 9.2 mg/dL (ref 8.9–10.3)
Chloride: 89 mmol/L — ABNORMAL LOW (ref 98–111)
Creatinine, Ser: 0.75 mg/dL (ref 0.44–1.00)
GFR, Estimated: >60 mL/min (ref >60)
Glucose, Bld: 130 mg/dL — ABNORMAL HIGH (ref 70–99)
Potassium: 4.2 mmol/L (ref 3.5–5.1)
Sodium: 127 mmol/L — ABNORMAL LOW (ref 135–145)

## 2023-06-21 ENCOUNTER — Encounter: Payer: Self-pay | Admitting: Family Medicine

## 2023-06-21 ENCOUNTER — Other Ambulatory Visit

## 2023-06-22 ENCOUNTER — Ambulatory Visit
Admission: RE | Admit: 2023-06-22 | Discharge: 2023-06-22 | Disposition: A | Discharge status: Home / Self Care | Source: Ambulatory Visit | Attending: Oncology | Admitting: Oncology

## 2023-06-22 DIAGNOSIS — Z79811 Long term (current) use of aromatase inhibitors: Secondary | ICD-10-CM | POA: Diagnosis present

## 2023-06-22 DIAGNOSIS — Z780 Asymptomatic menopausal state: Secondary | ICD-10-CM | POA: Diagnosis not present

## 2023-06-22 DIAGNOSIS — Z13820 Encounter for screening for osteoporosis: Secondary | ICD-10-CM | POA: Diagnosis present

## 2023-06-22 DIAGNOSIS — C50212 Malignant neoplasm of upper-inner quadrant of left female breast: Secondary | ICD-10-CM | POA: Diagnosis present

## 2023-07-11 ENCOUNTER — Ambulatory Visit
Admission: RE | Admit: 2023-07-11 | Discharge: 2023-07-11 | Disposition: A | Discharge status: Home / Self Care | Source: Ambulatory Visit | Attending: Urology | Admitting: Urology

## 2023-07-11 ENCOUNTER — Ambulatory Visit
Admission: RE | Admit: 2023-07-11 | Discharge: 2023-07-11 | Disposition: A | Discharge status: Home / Self Care | Attending: Urology | Admitting: Urology

## 2023-07-11 DIAGNOSIS — N200 Calculus of kidney: Secondary | ICD-10-CM

## 2023-07-13 ENCOUNTER — Encounter: Payer: Self-pay | Admitting: Urology

## 2023-07-13 ENCOUNTER — Ambulatory Visit (INDEPENDENT_AMBULATORY_CARE_PROVIDER_SITE_OTHER): Payer: Self-pay | Admitting: Urology

## 2023-07-13 VITALS — BP 111/70 | Ht 63.0 in | Wt 321.0 lb

## 2023-07-13 DIAGNOSIS — Z87440 Personal history of urinary (tract) infections: Secondary | ICD-10-CM | POA: Diagnosis not present

## 2023-07-13 DIAGNOSIS — Z87442 Personal history of urinary calculi: Secondary | ICD-10-CM

## 2023-07-13 DIAGNOSIS — N200 Calculus of kidney: Secondary | ICD-10-CM

## 2023-07-13 DIAGNOSIS — N390 Urinary tract infection, site not specified: Secondary | ICD-10-CM

## 2023-07-13 DIAGNOSIS — Z09 Encounter for follow-up examination after completed treatment for conditions other than malignant neoplasm: Secondary | ICD-10-CM

## 2023-07-13 MED ORDER — METHENAMINE HIPPURATE 1 G PO TABS: 1.0000 g | ORAL_TABLET | Freq: Two times a day (BID) | ORAL | 11 refills | Status: AC

## 2023-07-13 NOTE — Patient Instructions (Signed)
 Sharon Ware

## 2023-07-13 NOTE — Progress Notes (Signed)
   07/13/2023 11:17 AM   Sharon Ware 1950/10/21 [id number]  Reason for visit: Follow up right renal stone, recurrent UTI  HPI: Very comorbid 73 year old female with recurrent infections as well as a 1 cm right renal stone with intermittent right-sided flank pain, suspected to be uric acid based on density and urine pH.  She underwent uncomplicated right ureteroscopy, laser lithotripsy, stent placement on 12/10/2022, she removed her stent at home a few days later.  She has been doing very well since that time.  No UTIs since that time.  Was started on HipRex by PA [facility].  She can use on cranberry tablets.  She has deferred topical estrogen cream for UTI prevention previously.  I personally viewed and interpreted the KUB today that shows no definite evidence of recurrent stone disease.  Limitations of KUB for CT were reviewed.  I also reviewed the CT scan from October 2024 after her ureteroscopy that showed no evidence of residual stones.  We discussed general stone prevention strategies including adequate hydration with goal of producing 2.5 L of urine daily, increasing citric acid intake, increasing calcium intake during high oxalate meals, minimizing animal protein, and decreasing salt intake. Information about dietary recommendations given today.  She enjoys Crystal light lemonade and will continue that for citrate supplementation with her history of uric acid stones.  She would like to avoid potassium citrate.  RTC 1 year KUB prior If recurrent infections would again strongly recommend topical estrogen cream  Sondra Come, MD  [facility] [address] [place], [place] [address] [phone]

## 2023-07-19 ENCOUNTER — Other Ambulatory Visit: Payer: Self-pay

## 2023-07-19 ENCOUNTER — Encounter: Payer: Self-pay | Admitting: Oncology

## 2023-07-19 ENCOUNTER — Inpatient Hospital Stay: Payer: Medicare Other | Attending: Oncology | Admitting: Oncology

## 2023-07-19 VITALS — BP 114/70 | HR 89 | Temp 98.0°F | Resp 19 | Ht 63.0 in | Wt 321.0 lb

## 2023-07-19 DIAGNOSIS — Z08 Encounter for follow-up examination after completed treatment for malignant neoplasm: Secondary | ICD-10-CM

## 2023-07-19 DIAGNOSIS — Z79899 Other long term (current) drug therapy: Secondary | ICD-10-CM | POA: Diagnosis not present

## 2023-07-19 DIAGNOSIS — Z79811 Long term (current) use of aromatase inhibitors: Secondary | ICD-10-CM | POA: Insufficient documentation

## 2023-07-19 DIAGNOSIS — Z170 Estrogen receptor positive status [ER+]: Secondary | ICD-10-CM | POA: Insufficient documentation

## 2023-07-19 DIAGNOSIS — C50412 Malignant neoplasm of upper-outer quadrant of left female breast: Secondary | ICD-10-CM | POA: Diagnosis present

## 2023-07-19 DIAGNOSIS — C50212 Malignant neoplasm of upper-inner quadrant of left female breast: Secondary | ICD-10-CM | POA: Diagnosis not present

## 2023-07-19 MED ORDER — LETROZOLE 2.5 MG PO TABS: 2.5000 mg | ORAL_TABLET | Freq: Every day | ORAL | 2 refills | Status: AC

## 2023-07-19 NOTE — Progress Notes (Signed)
 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  Ultrasound showed a 1.2 x 0.8 x 0.7 cm mass 8 cm from the nipple at the 10:30 position.  No suspicious left axillary adenopathy.  This was biopsied and was consistent with invasive mammary carcinoma with tubular features grade 1.  No lymphovascular invasion identified.  ER/PR greater than 90% positive HER2 negative   Final pathology from lumpectomy showed tubular carcinoma grade 10 mm.  Multifocal superior margin positive.  Mutual decision between surgery radiation oncology and patient was to forego reexcision and proceed with adjuvant radiation treatment with radiation boost to the superior margin.   Patient completed adjuvant radiation treatment and started letrozole in November 2022.  Baseline bone density scan normal    Interval history-tolerating letrozole well without any significant side effects.  Denies any breast concerns today.   Denies any new aches and pains anywhere.  ECOG PS- 3 Pain scale- 0   Review of systems- Review of Systems  Constitutional:  Positive for malaise/fatigue. Negative for chills, fever and weight loss.  HENT:  Negative for congestion, ear discharge and nosebleeds.   Eyes:  Negative for blurred vision.  Respiratory:  Negative for cough, hemoptysis, sputum production, shortness of breath and wheezing.   Cardiovascular:  Negative for chest pain, palpitations, orthopnea and claudication.  Gastrointestinal:  Negative for abdominal pain, blood in stool, constipation, diarrhea, heartburn, melena, nausea and vomiting.  Genitourinary:  Negative for dysuria, flank pain, frequency, hematuria and urgency.  Musculoskeletal:  Negative for back pain, joint pain and myalgias.  Skin:  Negative for rash.  Neurological:  Negative for dizziness, tingling, focal weakness, seizures, weakness and headaches.  Endo/Heme/Allergies:  Does not bruise/bleed easily.  Psychiatric/Behavioral:  Negative for depression and suicidal ideas. The patient does not have insomnia.       Allergies  Allergen Reactions   Other Anaphylaxis    Honey Dew Melon   Wound Dressing Adhesive Rash   Ciprofloxacin     Back spasms   Morphine And Codeine Other (See Comments)    Hypotension   Shrimp (Diagnostic)    Latex Rash     Past Medical History:  Diagnosis Date   Aortic atherosclerosis (HCC)    Arthritis of both knees    Breast cancer (HCC)    Cataract    Genitourinary syndrome of menopause    Hiatal hernia    History of kidney stones    Hyperlipidemia    Hypertension    Long term current use of aromatase inhibitor    a.) letrozole   Long term current use of aspirin  Lymphedema    Malignant neoplasm of upper-inner quadrant of left breast in female, estrogen receptor positive (HCC)    Morbid obesity with BMI of 60.0-69.9, adult (HCC)    Pneumonia    T2DM (type 2 diabetes mellitus) (HCC)    Thoracic ascending aortic  aneurysm (HCC) 09/25/2022   a.) CT chest 09/25/2022: 4.2 cm   Umbilical hernia    Urinary tract infection due to ESBL Klebsiella    Vitamin D deficiency      Past Surgical History:  Procedure Laterality Date   ABDOMINAL HYSTERECTOMY     x2   BREAST BIOPSY Right 1990s   neg   BREAST BIOPSY Right 2000s   neg   BREAST BIOPSY Left 11/24/2020   u/s bx 10:30/8 cmfn-"heart" marker   BREAST EXCISIONAL BIOPSY Right    neg   BREAST LUMPECTOMY     BREAST LUMPECTOMY WITH RADIOFREQUENCY TAG IDENTIFICATION Left 12/10/2020   Procedure: BREAST LUMPECTOMY WITH RADIOFREQUENCY TAG IDENTIFICATION;  Surgeon: Flynn Hylan, MD;  Location: [facility] ORS;  Service: General;  Laterality: Left;   COLONOSCOPY     CYSTOSCOPY/URETEROSCOPY/HOLMIUM LASER/STENT PLACEMENT Right 12/10/2022   Procedure: CYSTOSCOPY/URETEROSCOPY/HOLMIUM LASER/STENT PLACEMENT;  Surgeon: Lawerence Pressman, MD;  Location: [facility] ORS;  Service: Urology;  Laterality: Right;   DILATION AND CURETTAGE OF UTERUS     EYE SURGERY  2004   cataract surgery and laser surgery   Salpingo-oophorectomy      Social History   Socioeconomic History   Marital status: Married    Spouse name: Royston Cornea   Number of children: Not on file   Years of education: Not on file   Highest education level: Not on file  Occupational History   Occupation: retired  Tobacco Use   Smoking status: Never    Passive exposure: Never   Smokeless tobacco: Never  Vaping Use   Vaping status: Never Used  Substance and Sexual Activity   Alcohol use: Not Currently   Drug use: Never   Sexual activity: Not Currently  Other Topics Concern   Not on file  Social History Narrative   Not on file   Social Drivers of Health   Financial Resource Strain: Low Risk  (10/26/2021)   Overall Financial Resource Strain (CARDIA)    Difficulty of Paying Living Expenses: Not hard at all  Food Insecurity: No Food Insecurity (09/30/2022)   Hunger Vital Sign    Worried About Running Out of  Food in the Last Year: Never true    Ran Out of Food in the Last Year: Never true  Transportation Needs: No Transportation Needs (09/30/2022)   PRAPARE - Administrator, Civil Service (Medical): No    Lack of Transportation (Non-Medical): No  Physical Activity: Inactive (10/26/2021)   Exercise Vital Sign    Days of Exercise per Week: 0 days    Minutes of Exercise per Session: 0 min  Stress: No Stress Concern Present (10/26/2021)   [employer] of Occupational Health - Occupational Stress Questionnaire    Feeling of Stress : Not at all  Social Connections: Moderately Isolated (10/26/2021)   Social Connection and Isolation Panel [NHANES]    Frequency of Communication with Friends and Family: More than three times a week    Frequency of Social Gatherings with Friends and Family: More than three times a week    Attends Religious Services: Never    Database administrator or Organizations: No    Attends Banker Meetings: Never    Marital  Status: Married  Catering manager Violence: Not At Risk (09/25/2022)   Humiliation, Afraid, Rape, and Kick questionnaire    Fear of Current or Ex-Partner: No    Emotionally Abused: No    Physically Abused: No    Sexually Abused: No    Family History  Problem Relation Age of Onset   Breast cancer Mother 12   Heart disease Father    Cancer Paternal Grandfather    Heart disease Paternal Grandfather      Current Outpatient Medications:    acetaminophen (TYLENOL) 650 MG CR tablet, Take 650 mg by mouth every 8 (eight) hours as needed for pain., Disp: , Rfl:    aspirin 81 MG tablet, Take 81 mg by mouth daily., Disp: , Rfl:    atorvastatin (LIPITOR) 20 MG tablet, Take 1 tablet (20 mg total) by mouth daily., Disp: 90 tablet, Rfl: 3   Calcium Citrate-Vitamin D (CALCIUM CITRATE + D3 PO), Take 1,200 mg by mouth daily., Disp: , Rfl:    Cholecalciferol (VITAMIN D3) 125 MCG (5000 UT) TABS, Take 5,000 Units by mouth daily., Disp: , Rfl:     CRANBERRY PO, Take 1 capsule by mouth daily at 12 noon., Disp: , Rfl:    letrozole (FEMARA) 2.5 MG tablet, Take 1 tablet (2.5 mg total) by mouth daily., Disp: 90 tablet, Rfl: 2   losartan-hydrochlorothiazide (HYZAAR) 50-12.5 MG tablet, Take 1 tablet by mouth daily., Disp: 90 tablet, Rfl: 1   Menthol, Topical Analgesic, (ICY HOT EX), Apply 1 application topically daily as needed (pain)., Disp: , Rfl:    metFORMIN (GLUCOPHAGE-XR) 500 MG 24 hr tablet, Take 2 tablets (1,000 mg total) by mouth 2 (two) times daily with a meal., Disp: 360 tablet, Rfl: 1   methenamine (HIPREX) 1 g tablet, Take 1 tablet (1 g total) by mouth 2 (two) times daily with a meal., Disp: 60 tablet, Rfl: 11   Omega-3 Fatty Acids (FISH OIL ULTRA) 1400 MG CAPS, Take 1,400 mg by mouth daily., Disp: , Rfl:    Semaglutide (RYBELSUS) 14 MG TABS, Take 1 tablet (14 mg total) by mouth daily., Disp: 30 tablet, Rfl: 6   spironolactone (ALDACTONE) 50 MG tablet, Take 1 tablet (50 mg total) by mouth daily., Disp: 90 tablet, Rfl: 1   UNABLE TO FIND, 3 (three) times a week. Probiotic Vaginal Pearls, Disp: , Rfl:   Physical exam:  Vitals:   07/19/23 1124  BP: 114/70  Pulse: 89  Resp: 19  Temp: 98 F (36.7 C)  TempSrc: Tympanic  SpO2: 96%  Weight: (!) 321 lb (145.6 kg)  Height: 5\' 3"  (1.6 m)   Physical Exam Cardiovascular:     Rate and Rhythm: Normal rate and regular rhythm.     Heart sounds: Normal heart sounds.  Pulmonary:     Effort: Pulmonary effort is normal.     Breath sounds: Normal breath sounds.  Skin:    General: Skin is warm and dry.  Neurological:     Mental Status: She is alert and oriented to person, place, and time.    Breast exam was performed in seated and lying down position. Patient is status post left lumpectomy with a well-healed surgical scar. No evidence of any palpable masses. No evidence of axillary adenopathy. No evidence of any palpable masses or lumps in the right breast. No evidence of right  axillary adenopathy   I have personally reviewed labs listed below:    Latest Ref Rng & Units 06/17/2023    9:29 AM  CMP  Glucose 70 - 99 mg/dL 213   BUN 8 - 23 mg/dL 17   Creatinine 0.86 - 1.00 mg/dL 5.78   Sodium 469 - 629 mmol/L 127   Potassium 3.5 - 5.1 mmol/L 4.2   Chloride 98 - 111 mmol/L 89   CO2 22 - 32 mmol/L 26   Calcium 8.9 - 10.3 mg/dL 9.2       Latest Ref Rng & Units 05/04/2023    9:47 AM  CBC  WBC 3.4 - 10.8 x10E3/uL 8.6   Hemoglobin 11.1 - 15.9 g/dL 52.8   Hematocrit 41.3 - 46.6 % 43.9   Platelets 150 - 450 x10E3/uL 264    I have personally reviewed Radiology images listed below: No images are attached to the encounter.  Abdomen 1 view (KUB) Result Date: 07/13/2023 CLINICAL DATA:  History of kidney stones EXAM: ABDOMEN - 1 VIEW COMPARISON:  CT renal January 26, 2023 FINDINGS: The bowel gas pattern is normal. No radio-opaque calculi or other significant radiographic abnormality are seen. IMPRESSION: Negative. Electronically Signed   By: Shaaron Adler M.D.   On: 07/13/2023 15:45   DG Bone Density Result Date: 06/22/2023 EXAM: DUAL X-RAY ABSORPTIOMETRY (DXA) FOR BONE MINERAL DENSITY IMPRESSION: Your patient Ariba Lehnen completed a BMD test on 06/22/2023 using the [employer] Advance DXA System (analysis version: 14.10) manufactured by [employer]. The following summarizes the results of our evaluation. Technologist: LCE PATIENT BIOGRAPHICAL: Name: Shianne, Zeiser Patient ID: [id number] Birth Date: May 08, 1950 Height: 63.0 in. Gender: Female Exam Date: 06/22/2023 Weight: 321.2 lbs. Indications: Caucasian, Diabetes, History of Breast Cancer, Hysterectomy, Oophorectomy Bilateral, POSTmenopausal Fractures: Treatments: Calcium, Letrozole, Metformin, Vitamin D ASSESSMENT: The BMD measured at Femur Neck Right is 1.294 g/cm2 with a T-score of 1.8. This patient is considered normal according to World Health Organization [facility]) criteria. L-3 was excluded due to degenerative  changes/compression fracture, etc. The scan quality is good. Site Region Measured Measured WHO Young Adult BMD Date       Age      Classification T-score AP Spine L1-L4 (L3) 4/22/2021 74.7 Normal 3.1 1.542 g/cm2 DualFemur Neck Right 06/22/2023 74.7 Normal 1.8 1.294 g/cm2 World Health Organization [facility]) criteria for post-menopausal, Caucasian Women: Normal:       T-score at or above -1 SD Osteopenia:   T-score between -1 and -2.5 SD Osteoporosis: T-score at or below -2.5 SD RECOMMENDATIONS: 1. All patients should optimize calcium and vitamin D intake. 2. Consider FDA-approved medical therapies in postmenopausal women and men aged 34 years and older, based on the following: a. A hip or vertebral (clinical or morphometric) fracture b. T-score < -2.5 at the femoral neck or spine after appropriate evaluation to exclude secondary causes c. Low bone mass (T-score between -1.0 and -2.5 at the femoral neck or spine) and a 10-year probability of a hip fracture > 3% or a 10-year probability of a major osteoporosis-related fracture > 20% based on the [place]-adapted WHO algorithm d. Clinician judgment and/or patient preferences may indicate treatment for people with 10-year fracture probabilities above or below these levels FOLLOW-UP: People with diagnosed cases of osteoporosis or at high risk for fracture should have regular bone mineral density tests. For patients eligible for Medicare, routine testing is allowed once every 2 years. The testing frequency can be increased to one year for patients who have rapidly progressing disease, those who are receiving or discontinuing medical therapy to restore bone mass, or have additional risk factors. I have reviewed this report, and agree with  the above findings. [facility] Radiology Electronically Signed   By: Dina  Arceo M.D.   On: 06/22/2023 12:00     Assessment and plan- Patient is a 73 y.o. female with history of stage I left breast cancer ER/PR positive HER2 negative currently  on letrozole here for a routine follow-up  Clinically patient is doing well with no concerning signs and symptoms of recurrence based on today's exam.  This is year 3 of surveillance and she will continue taking letrozole up until November 2027.  She will be due for mammogram in August 2025 which we will schedule.  Discussed the results of bone density scan which were normal and there is no evidence of osteopenia or osteoporosis.  She will continue with calcium and vitamin D at this time   Visit Diagnosis 1. Encounter for follow-up surveillance of breast cancer   2. Use of letrozole (Femara)      Dr. Seretha Dance, MD, MPH [facility] at [facility] [phone] 07/19/2023 12:28 PM

## 2023-08-04 ENCOUNTER — Ambulatory Visit: Payer: Self-pay | Admitting: Family Medicine

## 2023-08-04 ENCOUNTER — Encounter: Payer: Self-pay | Admitting: Family Medicine

## 2023-08-04 VITALS — BP 128/68 | HR 75 | Ht 63.0 in | Wt 317.4 lb

## 2023-08-04 DIAGNOSIS — Z7984 Long term (current) use of oral hypoglycemic drugs: Secondary | ICD-10-CM | POA: Diagnosis not present

## 2023-08-04 DIAGNOSIS — E118 Type 2 diabetes mellitus with unspecified complications: Secondary | ICD-10-CM | POA: Diagnosis not present

## 2023-08-04 DIAGNOSIS — I7121 Aneurysm of the ascending aorta, without rupture: Secondary | ICD-10-CM | POA: Diagnosis not present

## 2023-08-04 LAB — BAYER DCA HB A1C WAIVED: HB A1C (BAYER DCA - WAIVED): 6.5 % — ABNORMAL HIGH (ref 4.8–5.6)

## 2023-08-04 NOTE — Assessment & Plan Note (Signed)
 Due for repeat CTA in June. Ordered today.

## 2023-08-04 NOTE — Progress Notes (Signed)
 BP 128/68   Pulse 75   Ht 5\' 3"  (1.6 m)   Wt (!) 317 lb 6.4 oz (144 kg)   SpO2 92%   BMI 56.22 kg/m    Subjective:    Patient ID: Sharon Ware, female    DOB: 1951-02-23, 73 y.o.   MRN: [id number]  HPI: Sharon Ware is a 73 y.o. female  Chief Complaint  Patient presents with   Diabetes   DIABETES Hypoglycemic episodes:no Polydipsia/polyuria: no Visual disturbance: no Chest pain: no Paresthesias: no Glucose Monitoring: yes  Accucheck frequency: Daily Taking Insulin ?: no Blood Pressure Monitoring: not checking Retinal Examination: Up to Date Foot Exam: Not up to Date Diabetic Education: Completed Pneumovax: Up to Date Influenza: Up to Date Aspirin : yes  Relevant past medical, surgical, family and social history reviewed and updated as indicated. Interim medical history since our last visit reviewed. Allergies and medications reviewed and updated.  Review of Systems  Constitutional: Negative.   Respiratory: Negative.    Cardiovascular: Negative.   Gastrointestinal: Negative.   Neurological: Negative.   Psychiatric/Behavioral: Negative.      Per HPI unless specifically indicated above     Objective:    BP 128/68   Pulse 75   Ht 5\' 3"  (1.6 m)   Wt (!) 317 lb 6.4 oz (144 kg)   SpO2 92%   BMI 56.22 kg/m   Wt Readings from Last 3 Encounters:  08/04/23 (!) 317 lb 6.4 oz (144 kg)  07/19/23 (!) 321 lb (145.6 kg)  07/13/23 (!) 321 lb (145.6 kg)    Physical Exam Vitals and nursing note reviewed.  Constitutional:      General: She is not in acute distress.    Appearance: Normal appearance. She is obese. She is not ill-appearing, toxic-appearing or diaphoretic.  HENT:     Head: Normocephalic and atraumatic.     Right Ear: External ear normal.     Left Ear: External ear normal.     Nose: Nose normal.     Mouth/Throat:     Mouth: Mucous membranes are moist.     Pharynx: Oropharynx is clear.  Eyes:     General: No scleral icterus.       Right  eye: No discharge.        Left eye: No discharge.     Extraocular Movements: Extraocular movements intact.     Conjunctiva/sclera: Conjunctivae normal.     Pupils: Pupils are equal, round, and reactive to light.  Cardiovascular:     Rate and Rhythm: Normal rate and regular rhythm.     Pulses: Normal pulses.     Heart sounds: Normal heart sounds. No murmur heard.    No friction rub. No gallop.  Pulmonary:     Effort: Pulmonary effort is normal. No respiratory distress.     Breath sounds: Normal breath sounds. No stridor. No wheezing, rhonchi or rales.  Chest:     Chest wall: No tenderness.  Musculoskeletal:        General: Normal range of motion.     Cervical back: Normal range of motion and neck supple.  Skin:    General: Skin is warm and dry.     Capillary Refill: Capillary refill takes less than 2 seconds.     Coloration: Skin is not jaundiced or pale.     Findings: No bruising, erythema, lesion or rash.  Neurological:     General: No focal deficit present.     Mental Status: She is  alert and oriented to person, place, and time. Mental status is at baseline.  Psychiatric:        Mood and Affect: Mood normal.        Behavior: Behavior normal.        Thought Content: Thought content normal.        Judgment: Judgment normal.     Results for orders placed or performed during the hospital encounter of 06/17/23  Basic metabolic panel   Collection Time: 06/17/23  9:29 AM  Result Value Ref Range   Sodium 127 (L) 135 - 145 mmol/L   Potassium 4.2 3.5 - 5.1 mmol/L   Chloride 89 (L) 98 - 111 mmol/L   CO2 26 22 - 32 mmol/L   Glucose, Bld 130 (H) 70 - 99 mg/dL   BUN 17 8 - 23 mg/dL   Creatinine, Ser 0.34 0.44 - 1.00 mg/dL   Calcium  9.2 8.9 - 10.3 mg/dL   GFR, Estimated >74 >25 mL/min   Anion gap 12 5 - 15      Assessment & Plan:   Problem List Items Addressed This Visit       Cardiovascular and Mediastinum   Aneurysm of ascending aorta without rupture [facility]   Due for  repeat CTA in June. Ordered today.      Relevant Orders   CT ANGIO CHEST AORTA W/CM & OR WO/CM     Endocrine   Controlled diabetes mellitus type 2 with complications (HCC) - Primary   Up slightly with A1c of 6.5 from 6.0. Will continue current regimen and continue to monitor. Recheck 3 months- if still running high consider changing to mounjaro      Relevant Orders   Bayer DCA Hb A1c Waived   AMB Referral [facility]BCI Care Management     Follow up plan: Return in about 3 months (around 11/04/2023) for physical.

## 2023-08-04 NOTE — Assessment & Plan Note (Signed)
 Up slightly with A1c of 6.5 from 6.0. Will continue current regimen and continue to monitor. Recheck 3 months- if still running high consider changing to mounjaro

## 2023-08-12 ENCOUNTER — Telehealth: Payer: Self-pay | Admitting: *Deleted

## 2023-08-12 NOTE — Progress Notes (Signed)
 Care Guide Pharmacy Note  08/12/2023 Name: [name] MRN: [id number] DOB: [birth year]  Referred By: Solomon Dupre, DO Reason for referral: Complex Care Management (Outreach to schedule referral with pharmacist )   Sharon Ware is a 73 y.o. year old female who is a primary care patient of Solomon Dupre, DO.  Mindy Alu was referred to the pharmacist for assistance related to: DMII  Successful contact was made with the patient to discuss pharmacy services including being ready for the pharmacist to call at least 5 minutes before the scheduled appointment time and to have medication bottles and any blood pressure readings ready for review. The patient agreed to meet with the pharmacist via telephone visit on 08/24/2023  Kandis Ormond, CMA, Care Guide [facility], [facility] Guide Direct Dial: [phone]  Fax: [phone] Website: [email or web address]

## 2023-08-24 ENCOUNTER — Other Ambulatory Visit: Payer: Self-pay

## 2023-08-25 ENCOUNTER — Other Ambulatory Visit: Payer: Self-pay

## 2023-08-26 ENCOUNTER — Telehealth: Payer: Self-pay

## 2023-08-26 NOTE — Progress Notes (Signed)
 08/26/2023 Name: Sharon Ware MRN: [id number] DOB: 07/15/1950  Chief Complaint  Patient presents with   Diabetes Management Plan   Sharon Ware is a 73 y.o. year old female who presented for a telephone visit.   They were referred to the pharmacist by their PCP for assistance in managing diabetes.    Subjective:  Care Team: Primary Care Provider: Solomon Dupre, DO ; Next Scheduled Visit: 8/12  Medication Access/Adherence  Current Pharmacy:  [id number], [place] - [address] [place] [address] Phone: [phone] Fax: [phone]  -Patient reports affordability concerns with their medications: No  -Patient reports access/transportation concerns to their pharmacy: No  -Patient reports adherence concerns with their medications:  No    Diabetes: Current medications: Rybelsus  14mg  daily, metformin  XR 1000mg  BID -Medications tried in the past: Patient states Farxiga  was not effective in the past -A1c 6.5% on 5/1 -Provider would like to change patient from Rybelsus  14mg  to Mounjaro 7.5mg  weekly for further A1c improvement and weight loss benefit if affordable. -Patint states Rybelsus  is currently $245/month -Patient denies hypoglycemic s/sx including dizziness, shakiness, sweating.  -Patient denies hyperglycemic symptoms including polyuria, polydipsia, polyphagia, nocturia, neuropathy, blurred vision.  Objective:  Lab Results  Component Value Date   HGBA1C 6.5 (H) 08/04/2023   Lab Results  Component Value Date   CREATININE 0.75 06/17/2023   BUN 17 06/17/2023   NA 127 (L) 06/17/2023   K 4.2 06/17/2023   CL 89 (L) 06/17/2023   CO2 26 06/17/2023   Medications Reviewed Today     Reviewed by Linn Rich, RPH (Pharmacist) on 08/24/23 at 1415  Med List Status: <None>   Medication Order Taking? Sig Documenting Provider Last Dose Status Informant  acetaminophen  (TYLENOL ) 650 MG CR tablet [id number]  Take 650 mg by mouth every  8 (eight) hours as needed for pain. [provider]  Active Self  aspirin  81 MG tablet [id number]  Take 81 mg by mouth daily. [provider]  Active Self  atorvastatin  (LIPITOR) 20 MG tablet [id number]  Take 1 tablet (20 mg total) by mouth daily. Wenona Hamilton, MD  Expired 08/04/23 2359   Calcium  Citrate-Vitamin D  (CALCIUM  CITRATE + D3 PO) [id number]  Take 1,200 mg by mouth daily. [provider]  Active Self  Cholecalciferol (VITAMIN D3) 125 MCG (5000 UT) TABS [id number]  Take 5,000 Units by mouth daily. [provider]  Active Self  CRANBERRY PO [id number]  Take 1 capsule by mouth daily at 12 noon. [provider]  Active   letrozole  (FEMARA ) 2.5 MG tablet [id number]  Take 1 tablet (2.5 mg total) by mouth daily. Avonne Boettcher, MD  Active   losartan -hydrochlorothiazide  (HYZAAR) 50-12.5 MG tablet [id number]  Take 1 tablet by mouth daily. Johnson, Megan P, DO  Active   Menthol, Topical Analgesic, (ICY HOT EX) [id number]  Apply 1 application topically daily as needed (pain). [provider]  Active Self  metFORMIN  (GLUCOPHAGE -XR) 500 MG 24 hr tablet [id number] Yes Take 2 tablets (1,000 mg total) by mouth 2 (two) times daily with a meal. Lincoln Renshaw, Megan P, DO Taking Active   methenamine  (HIPREX ) 1 g tablet [id number]  Take 1 tablet (1 g total) by mouth 2 (two) times daily with a meal. Lawerence Pressman, MD  Active   Omega-3 Fatty Acids (FISH OIL ULTRA) 1400 MG CAPS [id number]  Take 1,400 mg by mouth daily. [provider]  Active Self  Semaglutide  (  RYBELSUS ) 14 MG TABS [id number] Yes Take 1 tablet (14 mg total) by mouth daily. Terre Ferri P, DO Taking Active   spironolactone  (ALDACTONE ) 50 MG tablet [id number]  Take 1 tablet (50 mg total) by mouth daily. Terre Ferri P, DO  Active   UNABLE TO FIND [id number]  3 (three) times a week. Probiotic Vaginal Pearls  Patient taking differently: 1 capsule daily. Probiotic Vaginal Pearls   [provider]  Active            Assessment/Plan:   Diabetes: -Currently controlled -Answered patient's questions about Mounjaro, discussed risks versus benefit in detail and informed the patient I believe potential benefits for her outweigh possible risks. -Will contact insurance to verify coverage of Mounjaro and check copay if covered and will follow-up with patient to discuss futher once this information is known.  Linn Rich, PharmD, DPLA

## 2023-08-26 NOTE — Progress Notes (Signed)
   08/26/2023  Patient ID: Sharon Ware, female   DOB: 1951-01-17, 73 y.o.   MRN: [id number]  Contacted patient's insurance, and Mounjaro , Ozempic, and Trulicity are the other GLP1 medications covered by her plan.  All mediations in this class do require a prior authorization; but if approved, copays would be the same for any of these medications.  Therefore, Mounjaro  7.5mg  weekly would be $245/month until patient's $2000 OOP max is met, which has an amount of $383 remaining.  Therefore once any GLP1 is filed x2, then copays will go down to $0 for the remainder of the year.  Contacted patient to inform of this medication and answered some additional questions.  Patient is still hesitant to change mediations since she is tolerating Rybelsus  well; but she does desire additional weight loss.  She would like to first try a sample of medication if available.  I informed her the office does not usually have samples of this medication and especially not at the strength she would need but told her I would verify.  Linn Rich, PharmD, DPLA

## 2023-08-26 NOTE — Progress Notes (Unsigned)
   08/31/2023  Patient ID: Sharon Ware, female   DOB: [birth year], 73 y.o.   MRN: [id number]  Patient outreach to inform [name] that [facility] does not have any samples of Mounjaro 7.5mg  weekly.  Patient has approximately 2 weeks left of Rybelsus  14mg , but she would like to go ahead and switch over to Mounujaro 7.5mg  weekly if/when PA approved by insurance.  I am coordinating with the prior auth team to initiate the PA process, and will work with provider to send prescription when approved.  Telephone follow-up scheduled for 6/19.  Linn Rich, PharmD, DPLA

## 2023-09-01 ENCOUNTER — Telehealth: Payer: Self-pay

## 2023-09-01 ENCOUNTER — Other Ambulatory Visit (HOSPITAL_COMMUNITY): Payer: Self-pay

## 2023-09-01 MED ORDER — TIRZEPATIDE 7.5 MG/0.5ML ~~LOC~~ SOAJ: 7.5000 mg | SUBCUTANEOUS | 1 refills | Status: DC

## 2023-09-01 NOTE — Progress Notes (Signed)
   09/01/2023  Patient ID: Sharon Ware, female   DOB: Apr 27, 1950, 73 y.o.   MRN: [id number]  In basket message from prior auth team stating test billing for Mounjaro  7.5mg  weekly reflects covered claim with no PA required at this time.  Pending prescription for Dr. Lincoln Renshaw to sign and sending patient a message to notify via MyChart.  Telephone follow-up visit already scheduled for 6/19.  Linn Rich, PharmD, DPLA

## 2023-09-01 NOTE — Telephone Encounter (Signed)
 Per test claim: The current 28 day co-pay is, $241.33.  No PA needed at this time. This test claim was processed through [facility]- copay amounts may vary at other pharmacies due to pharmacy/plan contracts, or as the patient moves through the different stages of their insurance plan.

## 2023-09-01 NOTE — Telephone Encounter (Signed)
 Pharmacy Patient Advocate Encounter   Received notification from [facility] that prior authorization for Mounjaro is required/requested.   Insurance verification completed.   The patient is insured through [employer] .   Per test claim: The current 28 day co-pay is, $241.33.  No PA needed at this time. This test claim was processed through [facility]- copay amounts may vary at other pharmacies due to pharmacy/plan contracts, or as the patient moves through the different stages of their insurance plan.

## 2023-09-02 ENCOUNTER — Telehealth: Payer: Self-pay | Admitting: Family Medicine

## 2023-09-02 NOTE — Telephone Encounter (Signed)
Scheduled for 6/20

## 2023-09-02 NOTE — Telephone Encounter (Signed)
-----   Message from [facility] sent at 05/04/2023 10:02 AM EST ----- Due for repeat CT chest for thoracic aneurysm

## 2023-09-22 ENCOUNTER — Other Ambulatory Visit: Payer: Self-pay

## 2023-09-22 MED ORDER — TIRZEPATIDE 10 MG/0.5ML ~~LOC~~ SOAJ: 10.0000 mg | SUBCUTANEOUS | 1 refills | Status: DC

## 2023-09-22 NOTE — Progress Notes (Unsigned)
   09/22/2023  Patient ID: Sharon Ware, female   DOB: 1950/07/06, 73 y.o.   MRN: [id number]  Subjective/Objective Telephone visit to follow-up on management of diabetes  Diabetes: Current medications: Mounjaro  7.5mg  weekly, metformin  XR 1000mg  BID -Medications tried in the past: Patient states Farxiga  was not effective in the past -A1c 6.5% on 5/1 -Recently changed from Rybelsus  14mg  to Mounjaro  7.5mg  weekly for additional A1c control and weight loss benefit.  Patient will take 4th dose this week and endorses tolerating well with no adverse side effects and state she is feeling better than when she was taking Rybelsus  -Has not noticed any additional appetite suppression or weight loss, though -Does not monitor home BG -Patient denies hypoglycemic s/sx including dizziness, shakiness, sweating.  -Patient denies hyperglycemic symptoms including polyuria, polydipsia, polyphagia, nocturia, neuropathy, blurred vision.  Assessment/Plan:    Diabetes: -Currently controlled -I recommend increasing to Mounjaro  10mg  weekly after this week's 7.5mg  dose for further appetite suppression and weight loss benefit.  Patient in agreement with plan -Prescription pending for 1 month supply of Mounjaro  10mg  with 1 additional refill to get patient until August follow-up with Dr. Lincoln Renshaw   Follow-up:  3 months  Linn Rich, PharmD, DPLA

## 2023-09-23 ENCOUNTER — Ambulatory Visit
Admission: RE | Admit: 2023-09-23 | Discharge: 2023-09-23 | Disposition: A | Discharge status: Home / Self Care | Source: Ambulatory Visit | Attending: Family Medicine | Admitting: Family Medicine

## 2023-09-23 DIAGNOSIS — I7121 Aneurysm of the ascending aorta, without rupture: Secondary | ICD-10-CM | POA: Insufficient documentation

## 2023-09-23 LAB — POCT I-STAT CREATININE: Creatinine, Ser: 0.9 mg/dL (ref 0.44–1.00)

## 2023-09-23 MED ORDER — IOHEXOL 350 MG/ML SOLN
100.0000 mL | Freq: Once | INTRAVENOUS | Status: AC | PRN
  Administered 2023-09-23: 100 mL via INTRAVENOUS

## 2023-10-03 ENCOUNTER — Ambulatory Visit: Payer: Self-pay | Admitting: Family Medicine

## 2023-10-18 ENCOUNTER — Ambulatory Visit: Attending: Cardiovascular Disease | Admitting: Cardiovascular Disease

## 2023-10-18 ENCOUNTER — Encounter: Payer: Self-pay | Admitting: Cardiovascular Disease

## 2023-10-18 VITALS — BP 137/70 | HR 72 | Ht 63.0 in | Wt 313.4 lb

## 2023-10-18 DIAGNOSIS — I152 Hypertension secondary to endocrine disorders: Secondary | ICD-10-CM | POA: Diagnosis present

## 2023-10-18 DIAGNOSIS — I10 Essential (primary) hypertension: Secondary | ICD-10-CM | POA: Insufficient documentation

## 2023-10-18 DIAGNOSIS — I2510 Atherosclerotic heart disease of native coronary artery without angina pectoris: Secondary | ICD-10-CM | POA: Insufficient documentation

## 2023-10-18 DIAGNOSIS — I77810 Thoracic aortic ectasia: Secondary | ICD-10-CM | POA: Diagnosis not present

## 2023-10-18 DIAGNOSIS — E1159 Type 2 diabetes mellitus with other circulatory complications: Secondary | ICD-10-CM | POA: Diagnosis present

## 2023-10-18 NOTE — Patient Instructions (Signed)

## 2023-10-18 NOTE — Progress Notes (Signed)
 Cardiology Office Note   Date:  10/18/2023   ID:  Sharon Ware, Sharon Ware Nov 12, 1950, MRN [id number]  PCP:  Vicci Duwaine SHAUNNA, DO  Cardiologist:   [name], MD   Chief Complaint  Patient presents with   Follow-up    6 month f/u. Meds reviewed verbally with pt.      History of Present Illness: Sharon Ware is a 73 y.o. female who is here today for a follow-up visit regarding coronary artery calcifications.    She has known history of breast cancer, type 2 diabetes, morbid obesity with a BMI greater than 50, essential hypertension and hyperlipidemia.  Breast cancer was treated with surgery and radiation therapy.  She was hospitalized in June with UTI and kidney stones.  She had CT chest without contrast in June 2024 which showed a 4.2 ascending thoracic aortic aneurysm with evidence of coronary calcifications.  Her mobility is limited and she frequently uses a wheelchair but is able to walk with a walker.    During her initial visit with me, I switched her from lovastatin  to atorvastatin .  She has been doing well with no chest pain or shortness of breath.  She underwent CT angio of the chest in June which showed that ascending aorta was 4 cm in diameter.  Past Medical History:  Diagnosis Date   Aortic atherosclerosis (HCC)    Arthritis of both knees    Breast cancer (HCC)    Cataract    Genitourinary syndrome of menopause    Hiatal hernia    History of kidney stones    Hyperlipidemia    Hypertension    Long term current use of aromatase inhibitor    a.) letrozole    Long term current use of aspirin     Lymphedema    Malignant neoplasm of upper-inner quadrant of left breast in female, estrogen receptor positive (HCC)    Morbid obesity with BMI of 60.0-69.9, adult (HCC)    Pneumonia    T2DM (type 2 diabetes mellitus) (HCC)    Thoracic ascending aortic aneurysm (HCC) 09/25/2022   a.) CT chest 09/25/2022: 4.2 cm   Umbilical hernia    Urinary tract infection  due to ESBL Klebsiella    Vitamin D  deficiency     Past Surgical History:  Procedure Laterality Date   ABDOMINAL HYSTERECTOMY     x2   BREAST BIOPSY Right 1990s   neg   BREAST BIOPSY Right 2000s   neg   BREAST BIOPSY Left 11/24/2020   u/s bx 10:30/8 cmfn-heart marker   BREAST EXCISIONAL BIOPSY Right    neg   BREAST LUMPECTOMY     BREAST LUMPECTOMY WITH RADIOFREQUENCY TAG IDENTIFICATION Left 12/10/2020   Procedure: BREAST LUMPECTOMY WITH RADIOFREQUENCY TAG IDENTIFICATION;  Surgeon: Lane Shope, MD;  Location: [facility] ORS;  Service: General;  Laterality: Left;   COLONOSCOPY     CYSTOSCOPY/URETEROSCOPY/HOLMIUM LASER/STENT PLACEMENT Right 12/10/2022   Procedure: CYSTOSCOPY/URETEROSCOPY/HOLMIUM LASER/STENT PLACEMENT;  Surgeon: Francisca Redell BROCKS, MD;  Location: [facility] ORS;  Service: Urology;  Laterality: Right;   DILATION AND CURETTAGE OF UTERUS     EYE SURGERY  2004   cataract surgery and laser surgery   Salpingo-oophorectomy       Current Outpatient Medications  Medication Sig Dispense Refill   acetaminophen  (TYLENOL ) 650 MG CR tablet Take 650 mg by mouth every 8 (eight) hours as needed for pain.     aspirin  81 MG tablet Take 81 mg by mouth daily.     atorvastatin  (  LIPITOR) 20 MG tablet Take 1 tablet (20 mg total) by mouth daily. 90 tablet 3   Calcium  Citrate-Vitamin D  (CALCIUM  CITRATE + D3 PO) Take 1,200 mg by mouth daily.     Cholecalciferol (VITAMIN D3) 125 MCG (5000 UT) TABS Take 5,000 Units by mouth daily.     CRANBERRY PO Take 1 capsule by mouth daily at 12 noon.     letrozole  (FEMARA ) 2.5 MG tablet Take 1 tablet (2.5 mg total) by mouth daily. 90 tablet 2   losartan -hydrochlorothiazide  (HYZAAR) 50-12.5 MG tablet Take 1 tablet by mouth daily. 90 tablet 1   metFORMIN  (GLUCOPHAGE -XR) 500 MG 24 hr tablet Take 2 tablets (1,000 mg total) by mouth 2 (two) times daily with a meal. 360 tablet 1   methenamine  (HIPREX ) 1 g tablet Take 1 tablet (1 g total) by mouth 2 (two) times daily  with a meal. 60 tablet 11   Omega-3 Fatty Acids (FISH OIL ULTRA) 1400 MG CAPS Take 1,400 mg by mouth daily.     spironolactone  (ALDACTONE ) 50 MG tablet Take 1 tablet (50 mg total) by mouth daily. 90 tablet 1   tirzepatide  (MOUNJARO ) 10 MG/0.5ML Pen Inject 10 mg into the skin once a week. Replacing 7.5mg  dose. 2 mL 1   UNABLE TO FIND 3 (three) times a week. Probiotic Vaginal Pearls (Patient taking differently: 1 capsule daily. Probiotic Vaginal Pearls)     Menthol, Topical Analgesic, (ICY HOT EX) Apply 1 application topically daily as needed (pain).     No current facility-administered medications for this visit.    Allergies:   Other, Wound dressing adhesive, Ciprofloxacin , Morphine and codeine, Shrimp (diagnostic), and Latex    Social History:  The patient  reports that she has never smoked. She has never been exposed to tobacco smoke. She has never used smokeless tobacco. She reports that she does not currently use alcohol. She reports that she does not use drugs.   Family History:  The patient's family history includes Breast cancer (age of onset: 38) in her mother; Cancer in her paternal grandfather; Heart disease in her father and paternal grandfather.    ROS:  Please see the history of present illness.   Otherwise, review of systems are positive for none.   All other systems are reviewed and negative.    PHYSICAL EXAM: VS:  BP 137/70 (BP Location: Left Wrist, Patient Position: Sitting, Cuff Size: Large)   Pulse 72   Ht 5' 3 (1.6 m)   Wt (!) 313 lb 6 oz (142.1 kg)   SpO2 96%   BMI 55.51 kg/m  , BMI Body mass index is 55.51 kg/m. GEN: Well nourished, well developed, in no acute distress  HEENT: normal  Neck: no JVD, carotid bruits, or masses Cardiac: RRR; no murmurs, rubs, or gallops,no edema  Respiratory:  clear to auscultation bilaterally, normal work of breathing GI: soft, nontender, nondistended, + BS MS: no deformity or atrophy  Skin: warm and dry, no rash Neuro:   Strength and sensation are intact Psych: euthymic mood, full affect   EKG:  EKG is ordered today. The ekg ordered today demonstrates : Normal sinus rhythm with sinus arrhythmia Right bundle branch block Left anterior fascicular block Bifascicular block When compared with ECG of 19-Apr-2023 15:14, Right bundle branch block has replaced Non-specific intra-ventricular conduction block       Recent Labs: 05/04/2023: ALT 18; Hemoglobin 14.7; Platelets 264 06/17/2023: BUN 17; Potassium 4.2; Sodium 127 09/23/2023: Creatinine, Ser 0.90    Lipid Panel  Component Value Date/Time   CHOL 116 05/04/2023 0947   TRIG 138 05/04/2023 0947   HDL 41 05/04/2023 0947   LDLCALC 51 05/04/2023 0947      Wt Readings from Last 3 Encounters:  10/18/23 (!) 313 lb 6 oz (142.1 kg)  08/04/23 (!) 317 lb 6.4 oz (144 kg)  07/19/23 (!) 321 lb (145.6 kg)          04/19/2023    3:07 PM  PAD Screen  Previous PAD dx? No  Previous surgical procedure? No  Pain with walking? No  Feet/toe relief with dangling? No  Painful, non-healing ulcers? No  Extremities discolored? No      ASSESSMENT AND PLAN:  1.  Coronary atherosclerosis: Noted on CT scan.  Currently with no anginal symptoms.  Continue treatment of risk factors.  2.  Borderline dilated ascending aorta: Recent CTA showed that this measured only 4 cm which is in the upper limit of normal considering age and BSA.  No need for future imaging unless indicated for other reasons.  3.  Hyperlipidemia: She is doing very well after she was switched to atorvastatin .  Follow-up lipid profile showed an LDL of 51.  4.  Essential hypertension: Blood pressure is controlled.    Disposition:   FU with in our office in 12 months.  Signed,  [name], MD  10/18/2023 4:12 PM    [facility]

## 2023-11-03 ENCOUNTER — Ambulatory Visit: Admitting: Emergency Medicine

## 2023-11-03 VITALS — Ht 63.0 in | Wt 300.0 lb

## 2023-11-03 DIAGNOSIS — Z0000 Encounter for general adult medical examination without abnormal findings: Secondary | ICD-10-CM | POA: Diagnosis not present

## 2023-11-03 NOTE — Patient Instructions (Signed)
 Sharon Ware , Thank you for taking time out of your busy schedule to complete your Annual Wellness Visit with me. I enjoyed our conversation and look forward to speaking with you again next year. I, as well as your care team,  appreciate your ongoing commitment to your health goals. Please review the following plan we discussed and let me know if I can assist you in the future. Your Game plan/ To Do List    Referrals: If you haven't heard from the office you've been referred to, please reach out to them at the phone provided.   Follow up Visits: We will see or speak with you next year for your Next Medicare AWV with our clinical staff Have you seen your provider in the last 6 months (3 months if uncontrolled diabetes)? Yes  Clinician Recommendations: Need a diabetic foot exam at your next OV with Dr. Vicci.  Aim for 30 minutes of exercise or brisk walking, 6-8 glasses of water , and 5 servings of fruits and vegetables each day.       This is a list of the screenings recommended for you:  Health Maintenance  Topic Date Due   Zoster (Shingles) Vaccine (1 of 2) Never done   Complete foot exam   12/24/2022   Cologuard (Stool DNA test)  09/19/2023   Yearly kidney health urinalysis for diabetes  11/03/2023   Flu Shot  11/04/2023   Mammogram  11/18/2023   Eye exam for diabetics  01/14/2024   Hemoglobin A1C  02/04/2024   Yearly kidney function blood test for diabetes  06/16/2024   Medicare Annual Wellness Visit  11/02/2024   DEXA scan (bone density measurement)  06/21/2028   DTaP/Tdap/Td vaccine (2 - Tdap) 08/08/2028   Pneumococcal Vaccine for age over 5  Completed   Hepatitis C Screening  Completed   Hepatitis B Vaccine  Aged Out   HPV Vaccine  Aged Out   Meningitis B Vaccine  Aged Out   COVID-19 Vaccine  Discontinued    Advanced directives: (Copy Requested) Please bring a copy of your health care power of attorney and living will to the office to be added to your chart at your  convenience. You can mail to [facility] [address] [place], [place] [address] or email to [email or web address] Advance Care Planning is important because it:  [x]  Makes sure you receive the medical care that is consistent with your values, goals, and preferences  [x]  It provides guidance to your family and loved ones and reduces their decisional burden about whether or not they are making the right decisions based on your wishes.  Follow the link provided in your after visit summary or read over the paperwork we have mailed to you to help you started getting your Advance Directives in place. If you need assistance in completing these, please reach out to us  so that we can help you!  See attachments for Preventive Care and Fall Prevention Tips.   Fall Prevention in the Home, Adult Falls can cause injuries and affect people of all ages. There are many simple things that you can do to make your home safe and to help prevent falls. If you need it, ask for help making these changes. What actions can I take to prevent falls? General information Use good lighting in all rooms. Make sure to: Replace any light bulbs that burn out. Turn on lights if it is dark and use night-lights. Keep items that you use often in  easy-to-reach places. Lower the shelves around your home if needed. Move furniture so that there are clear paths around it. Do not keep throw rugs or other things on the floor that can make you trip. If any of your floors are uneven, fix them. Add color or contrast paint or tape to clearly mark and help you see: Grab bars or handrails. First and last steps of staircases. Where the edge of each step is. If you use a ladder or stepladder: Make sure that it is fully opened. Do not climb a closed ladder. Make sure the sides of the ladder are locked in place. Have someone hold the ladder while you use it. Know where your pets are as you move through your  home. What can I do in the bathroom?     Keep the floor dry. Clean up any water  that is on the floor right away. Remove soap buildup in the bathtub or shower. Buildup makes bathtubs and showers slippery. Use non-skid mats or decals on the floor of the bathtub or shower. Attach bath mats securely with double-sided, non-slip rug tape. If you need to sit down while you are in the shower, use a non-slip stool. Install grab bars by the toilet and in the bathtub and shower. Do not use towel bars as grab bars. What can I do in the bedroom? Make sure that you have a light by your bed that is easy to reach. Do not use any sheets or blankets on your bed that hang to the floor. Have a firm bench or chair with side arms that you can use for support when you get dressed. What can I do in the kitchen? Clean up any spills right away. If you need to reach something above you, use a sturdy step stool that has a grab bar. Keep electrical cables out of the way. Do not use floor polish or wax that makes floors slippery. What can I do with my stairs? Do not leave anything on the stairs. Make sure that you have a light switch at the top and the bottom of the stairs. Have them installed if you do not have them. Make sure that there are handrails on both sides of the stairs. Fix handrails that are broken or loose. Make sure that handrails are as long as the staircases. Install non-slip stair treads on all stairs in your home if they do not have carpet. Avoid having throw rugs at the top or bottom of stairs, or secure the rugs with carpet tape to prevent them from moving. Choose a carpet design that does not hide the edge of steps on the stairs. Make sure that carpet is firmly attached to the stairs. Fix any carpet that is loose or worn. What can I do on the outside of my home? Use bright outdoor lighting. Repair the edges of walkways and driveways and fix any cracks. Clear paths of anything that can make you  trip, such as tools or rocks. Add color or contrast paint or tape to clearly mark and help you see high doorway thresholds. Trim any bushes or trees on the main path into your home. Check that handrails are securely fastened and in good repair. Both sides of all steps should have handrails. Install guardrails along the edges of any raised decks or porches. Have leaves, snow, and ice cleared regularly. Use sand, salt, or ice melt on walkways during winter months if you live where there is ice and snow. In the garage,  clean up any spills right away, including grease or oil spills. What other actions can I take? Review your medicines with your health care provider. Some medicines can make you confused or feel dizzy. This can increase your chance of falling. Wear closed-toe shoes that fit well and support your feet. Wear shoes that have rubber soles and low heels. Use a cane, walker, scooter, or crutches that help you move around if needed. Talk with your provider about other ways that you can decrease your risk of falls. This may include seeing a physical therapist to learn to do exercises to improve movement and strength. Where to find more information Centers for Disease Control and Prevention, STEADI: TonerPromos.no [employer] on Aging: BaseRingTones.pl National Institute on Aging: BaseRingTones.pl Contact a health care provider if: You are afraid of falling at home. You feel weak, drowsy, or dizzy at home. You fall at home. Get help right away if you: Lose consciousness or have trouble moving after a fall. Have a fall that causes a head injury. These symptoms may be an emergency. Get help right away. Call 911. Do not wait to see if the symptoms will go away. Do not drive yourself to the hospital. This information is not intended to replace advice given to you by your health care provider. Make sure you discuss any questions you have with your health care provider. Document Revised: 11/23/2021  Document Reviewed: 11/23/2021 Elsevier Patient Education  2024 [employer].

## 2023-11-03 NOTE — Progress Notes (Signed)
 Subjective:   Sharon Ware is a 73 y.o. who presents for a Medicare Wellness preventive visit.  As a reminder, Annual Wellness Visits don't include a physical exam, and some assessments may be limited, especially if this visit is performed virtually. We may recommend an in-person follow-up visit with your provider if needed.  Visit Complete: Virtual I connected with  Aldona SHAUNNA Fought on 11/03/23 by a audio enabled telemedicine application and verified that I am speaking with the correct person using two identifiers.  Patient Location: Home  Provider Location: Home Office  I discussed the limitations of evaluation and management by telemedicine. The patient expressed understanding and agreed to proceed.  Vital Signs: Because this visit was a virtual/telehealth visit, some criteria may be missing or patient reported. Any vitals not documented were not able to be obtained and vitals that have been documented are patient reported.  VideoDeclined- This patient declined Librarian, academic. Therefore the visit was completed with audio only.  Persons Participating in Visit: Patient.  AWV Questionnaire: No: Patient Medicare AWV questionnaire was not completed prior to this visit.  Cardiac Risk Factors include: advanced age (>28men, >36 women);diabetes mellitus;hypertension;dyslipidemia;obesity (BMI >30kg/m2);sedentary lifestyle     Objective:    Today's Vitals   11/03/23 1555  Weight: 300 lb (136.1 kg)  Height: 5' 3 (1.6 m)   Body mass index is 53.14 kg/m.     11/03/2023    4:06 PM 07/19/2023   11:19 AM 03/10/2023   11:05 AM 01/18/2023   10:25 AM 12/10/2022    7:20 AM 12/02/2022   10:36 AM 11/13/2022    1:10 PM  Advanced Directives  Does Patient Have a Medical Advance Directive? Yes No Yes Yes Yes Yes No  Type of Estate agent of [place];Living will  Healthcare Power of [place];Living will Healthcare Power of [place];Living  will Healthcare Power of [place];Living will    Does patient want to make changes to medical advance directive? No - Patient declined  No - Patient declined No - Patient declined No - Patient declined    Copy of Healthcare Power of Attorney in Chart? No - copy requested  No - copy requested No - copy requested No - copy requested    Would patient like information on creating a medical advance directive?  No - Patient declined         Current Medications (verified) Outpatient Encounter Medications as of 11/03/2023  Medication Sig   acetaminophen  (TYLENOL ) 650 MG CR tablet Take 650 mg by mouth every 8 (eight) hours as needed for pain.   aspirin  81 MG tablet Take 81 mg by mouth daily.   atorvastatin  (LIPITOR) 20 MG tablet Take 1 tablet (20 mg total) by mouth daily.   Calcium  Citrate-Vitamin D  (CALCIUM  CITRATE + D3 PO) Take 1,200 mg by mouth daily.   Cholecalciferol (VITAMIN D3) 125 MCG (5000 UT) TABS Take 5,000 Units by mouth daily.   CRANBERRY PO Take 1 capsule by mouth daily at 12 noon.   letrozole  (FEMARA ) 2.5 MG tablet Take 1 tablet (2.5 mg total) by mouth daily.   losartan -hydrochlorothiazide  (HYZAAR) 50-12.5 MG tablet Take 1 tablet by mouth daily.   metFORMIN  (GLUCOPHAGE -XR) 500 MG 24 hr tablet Take 2 tablets (1,000 mg total) by mouth 2 (two) times daily with a meal.   methenamine  (HIPREX ) 1 g tablet Take 1 tablet (1 g total) by mouth 2 (two) times daily with a meal.   Omega-3 Fatty Acids (FISH OIL ULTRA)  1400 MG CAPS Take 1,400 mg by mouth daily.   spironolactone  (ALDACTONE ) 50 MG tablet Take 1 tablet (50 mg total) by mouth daily.   tirzepatide  (MOUNJARO ) 10 MG/0.5ML Pen Inject 10 mg into the skin once a week. Replacing 7.5mg  dose.   UNABLE TO FIND 3 (three) times a week. Probiotic Vaginal Pearls (Patient taking differently: 3 (three) times a week. Probiotic Vaginal Pearls  Taking daily)   Menthol, Topical Analgesic, (ICY HOT EX) Apply 1 application topically daily as needed (pain).  (Patient not taking: Reported on 11/03/2023)   No facility-administered encounter medications on file as of 11/03/2023.    Allergies (verified) Other, Wound dressing adhesive, Ciprofloxacin , Morphine and codeine, Shrimp (diagnostic), and Latex   History: Past Medical History:  Diagnosis Date   Aortic atherosclerosis (HCC)    Arthritis of both knees    Breast cancer (HCC)    Cataract    Genitourinary syndrome of menopause    Hiatal hernia    History of kidney stones    Hyperlipidemia    Hypertension    Long term current use of aromatase inhibitor    a.) letrozole    Long term current use of aspirin     Lymphedema    Malignant neoplasm of upper-inner quadrant of left breast in female, estrogen receptor positive (HCC)    Morbid obesity with BMI of 60.0-69.9, adult (HCC)    Pneumonia    T2DM (type 2 diabetes mellitus) (HCC)    Thoracic ascending aortic aneurysm (HCC) 09/25/2022   a.) CT chest 09/25/2022: 4.2 cm   Umbilical hernia    Urinary tract infection due to ESBL Klebsiella    Vitamin D  deficiency    Past Surgical History:  Procedure Laterality Date   ABDOMINAL HYSTERECTOMY     x2   BREAST BIOPSY Right 1990s   neg   BREAST BIOPSY Right 2000s   neg   BREAST BIOPSY Left 11/24/2020   u/s bx 10:30/8 cmfn-heart marker   BREAST EXCISIONAL BIOPSY Right    neg   BREAST LUMPECTOMY     BREAST LUMPECTOMY WITH RADIOFREQUENCY TAG IDENTIFICATION Left 12/10/2020   Procedure: BREAST LUMPECTOMY WITH RADIOFREQUENCY TAG IDENTIFICATION;  Surgeon: Lane Shope, MD;  Location: [facility] ORS;  Service: General;  Laterality: Left;   COLONOSCOPY     CYSTOSCOPY/URETEROSCOPY/HOLMIUM LASER/STENT PLACEMENT Right 12/10/2022   Procedure: CYSTOSCOPY/URETEROSCOPY/HOLMIUM LASER/STENT PLACEMENT;  Surgeon: Francisca Redell BROCKS, MD;  Location: [facility] OR[facility];  Service: Urology;  Laterality: Right;   DILATION AND CURETTAGE OF UTERUS     EYE SURGERY  2004   cataract surgery and laser surgery    Salpingo-oophorectomy     Family History  Problem Relation Age of Onset   Breast cancer Mother 78   Heart disease Father    Cancer Paternal Grandfather    Heart disease Paternal Grandfather    Social History   Socioeconomic History   Marital status: Married    Spouse name: Lynwood   Number of children: 2   Years of education: Not on file   Highest education level: Not on file  Occupational History   Occupation: retired  Tobacco Use   Smoking status: Never    Passive exposure: Never   Smokeless tobacco: Never  Vaping Use   Vaping status: Never Used  Substance and Sexual Activity   Alcohol use: Not Currently   Drug use: Never   Sexual activity: Not Currently  Other Topics Concern   Not on file  Social History Narrative   Not on file  Social Drivers of Corporate investment banker Strain: Low Risk  (11/03/2023)   Overall Financial Resource Strain (CARDIA)    Difficulty of Paying Living Expenses: Not hard at all  Food Insecurity: No Food Insecurity (11/03/2023)   Hunger Vital Sign    Worried About Running Out of Food in the Last Year: Never true    Ran Out of Food in the Last Year: Never true  Transportation Needs: No Transportation Needs (11/03/2023)   PRAPARE - Administrator, Civil Service (Medical): No    Lack of Transportation (Non-Medical): No  Physical Activity: Inactive (11/03/2023)   Exercise Vital Sign    Days of Exercise per Week: 0 days    Minutes of Exercise per Session: 0 min  Stress: No Stress Concern Present (11/03/2023)   [employer] of Occupational Health - Occupational Stress Questionnaire    Feeling of Stress: Not at all  Social Connections: Moderately Isolated (11/03/2023)   Social Connection and Isolation Panel    Frequency of Communication with Friends and Family: Three times a week    Frequency of Social Gatherings with Friends and Family: More than three times a week    Attends Religious Services: Never    Database administrator  or Organizations: No    Attends Engineer, structural: Never    Marital Status: Married    Tobacco Counseling Counseling given: Not Answered    Clinical Intake:  Pre-visit preparation completed: Yes  Pain : No/denies pain     BMI - recorded: 53.14 Nutritional Status: BMI > 30  Obese Nutritional Risks: None Diabetes: Yes CBG done?: No Did pt. bring in CBG monitor from home?: No  Lab Results  Component Value Date   HGBA1C 6.5 (H) 08/04/2023   HGBA1C 6.0 (H) 05/04/2023   HGBA1C 7.3 (H) 02/01/2023     How often do you need to have someone help you when you read instructions, pamphlets, or other written materials from your doctor or pharmacy?: 1 - Never  Interpreter Needed?: No  Information entered by :: Vina Ned, CMA   Activities of Daily Living     11/03/2023    3:57 PM 12/02/2022   10:38 AM  In your present state of health, do you have any difficulty performing the following activities:  Hearing? 0   Vision? 0   Difficulty concentrating or making decisions? 0   Walking or climbing stairs? 1   Comment uses standard walker or electric wheelchair   Dressing or bathing? 0   Doing errands, shopping? 1 0  Comment husband takes to appointments   Preparing Food and eating ? N   Using the Toilet? N   In the past six months, have you accidently leaked urine? N   Do you have problems with loss of bowel control? N   Managing your Medications? N   Managing your Finances? N   Housekeeping or managing your Housekeeping? N     Patient Care Team: Vicci Duwaine SQUIBB, DO as PCP - General (Family Medicine) Darron Deatrice LABOR, MD as PCP - Cardiology (Cardiology) Melanee Annah BROCKS, MD as Consulting Physician (Oncology) Myrna Adine Anes, MD as Consulting Physician (Ophthalmology) Francisca Redell BROCKS, MD as Consulting Physician (Urology)  I have updated your Care Teams any recent Medical Services you may have received from other providers in the past year.      Assessment:   This is a routine wellness examination for [facility].  Hearing/Vision screen Hearing Screening - Comments:: Denies  hearing loss  Vision Screening - Comments:: Gets DM eye exams, Dr. Adine Novak, [place]   Goals Addressed             This Visit's Progress    Patient Stated       Continue to lose weight       Depression Screen     11/03/2023    4:04 PM 08/04/2023    9:33 AM 05/04/2023    9:46 AM 02/01/2023    9:51 AM 11/09/2022    8:23 AM 11/01/2022   10:41 AM 08/16/2022    1:58 PM  PHQ 2/9 Scores  PHQ - 2 Score 0 0 0 0 0 0 0  PHQ- 9 Score 0 0 0  0 0     Fall Risk     11/03/2023    4:07 PM 05/04/2023    9:46 AM 02/01/2023    9:51 AM 11/09/2022    8:23 AM 11/01/2022   10:41 AM  Fall Risk   Falls in the past year? 0 0 0 0 0  Number falls in past yr: 0 0 0  0  Injury with Fall? 0 0 0  0  Risk for fall due to : Impaired balance/gait;Orthopedic patient;Impaired mobility No Fall Risks No Fall Risks No Fall Risks No Fall Risks  Follow up Falls evaluation completed;Education provided Falls evaluation completed Falls evaluation completed Falls evaluation completed Falls evaluation completed    MEDICARE RISK AT HOME:  Medicare Risk at Home Any stairs in or around the home?: No (has a ramp) If so, are there any without handrails?: No Home free of loose throw rugs in walkways, pet beds, electrical cords, etc?: Yes Adequate lighting in your home to reduce risk of falls?: Yes Life alert?: No Use of a cane, walker or w/c?: Yes (standard walker or electric wheelchair) Grab bars in the bathroom?: Yes Shower chair or bench in shower?: Yes Elevated toilet seat or a handicapped toilet?: No  TIMED UP AND GO:  Was the test performed?  No  Cognitive Function: 6CIT completed        11/03/2023    4:08 PM 11/01/2022   11:15 AM 10/24/2020   10:34 AM  6CIT Screen  What Year? 0 points 0 points 0 points  What month? 0 points 0 points 0 points  What time? 0 points 0  points 0 points  Count back from 20 0 points 0 points 0 points  Months in reverse 0 points 0 points 0 points  Repeat phrase 0 points 6 points 0 points  Total Score 0 points 6 points 0 points    Immunizations Immunization History  Administered Date(s) Administered   Fluad Quad(high Dose 65+) 01/09/2020, 12/19/2020, 12/23/2021, 01/25/2023   Fluad Trivalent(High Dose 65+) 01/26/2023   Influenza, Seasonal, Injecte, Preservative Fre 03/01/2000, 02/06/2002   Influenza-Unspecified 12/09/2018   Moderna Covid-19 Vaccine Bivalent Booster 18yrs & up 03/16/2021   Moderna Sars-Covid-2 Vaccination 05/08/2019, 06/05/2019, 02/15/2020, 10/27/2020, 10/28/2020, 03/16/2021   PNEUMOCOCCAL CONJUGATE-20 03/02/2023   Pneumococcal Conjugate-13 08/14/2013, 02/03/2014   Pneumococcal Polysaccharide-23 04/05/2009, 04/05/2014   RSV,unspecified 02/01/2023   Respiratory Syncytial Virus Vaccine,Recomb Aduvanted(Arexvy) 02/01/2023   Td 08/09/2018    Screening Tests Health Maintenance  Topic Date Due   Zoster Vaccines- Shingrix (1 of 2) Never done   FOOT EXAM  12/24/2022   Fecal DNA (Cologuard)  09/19/2023   Diabetic kidney evaluation - Urine ACR  11/03/2023   INFLUENZA VACCINE  11/04/2023   MAMMOGRAM  11/18/2023  OPHTHALMOLOGY EXAM  01/14/2024   HEMOGLOBIN A1C  02/04/2024   Diabetic kidney evaluation - eGFR measurement  06/16/2024   Medicare Annual Wellness (AWV)  11/02/2024   DEXA SCAN  06/21/2028   DTaP/Tdap/Td (2 - Tdap) 08/08/2028   Pneumococcal Vaccine: 50+ Years  Completed   Hepatitis C Screening  Completed   Hepatitis B Vaccines  Aged Out   HPV VACCINES  Aged Out   Meningococcal B Vaccine  Aged Out   COVID-19 Vaccine  Discontinued    Health Maintenance  Health Maintenance Due  Topic Date Due   Zoster Vaccines- Shingrix (1 of 2) Never done   FOOT EXAM  12/24/2022   Fecal DNA (Cologuard)  09/19/2023   Diabetic kidney evaluation - Urine ACR  11/03/2023   Health Maintenance Items  Addressed: See Nurse Notes at the end of this note  Additional Screening:  Vision Screening: Recommended annual ophthalmology exams for early detection of glaucoma and other disorders of the eye. Would you like a referral to an eye doctor? No    Dental Screening: Recommended annual dental exams for proper oral hygiene  Community Resource Referral / Chronic Care Management: CRR required this visit?  No   CCM required this visit?  No   Plan:    I have personally reviewed and noted the following in the patient's chart:   Medical and social history Use of alcohol, tobacco or illicit drugs  Current medications and supplements including opioid prescriptions. Patient is not currently taking opioid prescriptions. Functional ability and status Nutritional status Physical activity Advanced directives List of other physicians Hospitalizations, surgeries, and ER visits in previous 12 months Vitals Screenings to include cognitive, depression, and falls Referrals and appointments  In addition, I have reviewed and discussed with patient certain preventive protocols, quality metrics, and best practice recommendations. A written personalized care plan for preventive services as well as general preventive health recommendations were provided to patient.   Vina Ned, CMA   11/03/2023   After Visit Summary: (MyChart) Due to this being a telephonic visit, the after visit summary with patients personalized plan was offered to patient via MyChart   Notes:  Needs DM foot exam at next OV (11/15/23) Needs cologuard ordered. Did not allow me to order. Wants Dr. Vicci to order. MMG scheduled 11/21/23 (oncology follows) Declined DM & Nutrition education referral Declined shingles and covid vaccines

## 2023-11-15 ENCOUNTER — Ambulatory Visit (INDEPENDENT_AMBULATORY_CARE_PROVIDER_SITE_OTHER): Admitting: Family Medicine

## 2023-11-15 ENCOUNTER — Encounter: Payer: Self-pay | Admitting: Family Medicine

## 2023-11-15 VITALS — Temp 98.0°F | Ht 63.0 in | Wt 306.6 lb

## 2023-11-15 DIAGNOSIS — I152 Hypertension secondary to endocrine disorders: Secondary | ICD-10-CM

## 2023-11-15 DIAGNOSIS — E559 Vitamin D deficiency, unspecified: Secondary | ICD-10-CM

## 2023-11-15 DIAGNOSIS — E118 Type 2 diabetes mellitus with unspecified complications: Secondary | ICD-10-CM

## 2023-11-15 DIAGNOSIS — E782 Mixed hyperlipidemia: Secondary | ICD-10-CM | POA: Diagnosis not present

## 2023-11-15 DIAGNOSIS — E1159 Type 2 diabetes mellitus with other circulatory complications: Secondary | ICD-10-CM

## 2023-11-15 DIAGNOSIS — Z1211 Encounter for screening for malignant neoplasm of colon: Secondary | ICD-10-CM

## 2023-11-15 LAB — BAYER DCA HB A1C WAIVED: HB A1C (BAYER DCA - WAIVED): 5.9 % — ABNORMAL HIGH (ref 4.8–5.6)

## 2023-11-15 MED ORDER — SPIRONOLACTONE 50 MG PO TABS: 50.0000 mg | ORAL_TABLET | Freq: Every day | ORAL | 1 refills | Status: DC

## 2023-11-15 MED ORDER — METFORMIN HCL ER 500 MG PO TB24: 1000.0000 mg | ORAL_TABLET | Freq: Two times a day (BID) | ORAL | 1 refills | Status: DC

## 2023-11-15 MED ORDER — TIRZEPATIDE 12.5 MG/0.5ML ~~LOC~~ SOAJ
12.5000 mg | SUBCUTANEOUS | 0 refills | Status: DC
Start: 2023-11-15 — End: 2024-02-09

## 2023-11-15 MED ORDER — LOSARTAN POTASSIUM-HCTZ 50-12.5 MG PO TABS: 1.0000 | ORAL_TABLET | Freq: Every day | ORAL | 1 refills | Status: DC

## 2023-11-15 NOTE — Assessment & Plan Note (Signed)
 Doing well with A1c of 5.9- will increase her mounjaro  to 12.5mg  weekly. Recheck 3 months. Call with any concerns.

## 2023-11-15 NOTE — Assessment & Plan Note (Signed)
 Rechecking labs today. Await results. Call with any concerns.

## 2023-11-15 NOTE — Assessment & Plan Note (Signed)
 Under good control on current regimen. Continue current regimen. Continue to monitor. Call with any concerns. Refills given. Labs drawn today.

## 2023-11-15 NOTE — Progress Notes (Signed)
 Temp 98 F (36.7 C) (Oral)   Ht 5' 3 (1.6 m)   Wt (!) 306 lb 9.6 oz (139.1 kg)   SpO2 94%   BMI 54.31 kg/m    Subjective:    Patient ID: Sharon Ware, female    DOB: 1951/04/01, 73 y.o.   MRN: [id number]  HPI: Sharon Ware is a 73 y.o. female  Chief Complaint  Patient presents with   Diabetes   DIABETES Hypoglycemic episodes:no Polydipsia/polyuria: no Visual disturbance: no Chest pain: no Paresthesias: no Glucose Monitoring: yes Taking Insulin ?: no Blood Pressure Monitoring: not checking Retinal Examination: Up to Date Foot Exam: Up to Date Diabetic Education: Completed Pneumovax: Up to Date Influenza: Not up to Date Aspirin : yes  HYPERTENSION / HYPERLIPIDEMIA Satisfied with current treatment? yes Duration of hypertension: chronic BP monitoring frequency: rarely BP medication side effects: no Past BP meds: losartan , hydrochlorothiazide , spironalactone Duration of hyperlipidemia: chronic Cholesterol medication side effects: no Cholesterol supplements: none Past cholesterol medications: atorvastatin  Medication compliance: excellent compliance Aspirin : yes Recent stressors: no Recurrent headaches: no Visual changes: no Palpitations: no Dyspnea: no Chest pain: no Lower extremity edema: no Dizzy/lightheaded: no  Relevant past medical, surgical, family and social history reviewed and updated as indicated. Interim medical history since our last visit reviewed. Allergies and medications reviewed and updated.  Review of Systems  Constitutional: Negative.   HENT: Negative.    Eyes: Negative.   Respiratory: Negative.    Cardiovascular:  Positive for leg swelling. Negative for chest pain and palpitations.  Gastrointestinal: Negative.   Endocrine: Negative.   Genitourinary: Negative.   Musculoskeletal: Negative.   Allergic/Immunologic: Negative.   Neurological: Negative.   Hematological: Negative.   Psychiatric/Behavioral: Negative.      Per  HPI unless specifically indicated above     Objective:    Temp 98 F (36.7 C) (Oral)   Ht 5' 3 (1.6 m)   Wt (!) 306 lb 9.6 oz (139.1 kg)   SpO2 94%   BMI 54.31 kg/m   Wt Readings from Last 3 Encounters:  11/15/23 (!) 306 lb 9.6 oz (139.1 kg)  11/03/23 300 lb (136.1 kg)  10/18/23 (!) 313 lb 6 oz (142.1 kg)    Physical Exam Vitals and nursing note reviewed.  Constitutional:      General: She is not in acute distress.    Appearance: Normal appearance. She is not ill-appearing, toxic-appearing or diaphoretic.  HENT:     Head: Normocephalic and atraumatic.     Right Ear: Tympanic membrane, ear canal and external ear normal. There is no impacted cerumen.     Left Ear: Tympanic membrane, ear canal and external ear normal. There is no impacted cerumen.     Nose: Nose normal. No congestion or rhinorrhea.     Mouth/Throat:     Mouth: Mucous membranes are moist.     Pharynx: Oropharynx is clear. No oropharyngeal exudate or posterior oropharyngeal erythema.  Eyes:     General: No scleral icterus.       Right eye: No discharge.        Left eye: No discharge.     Extraocular Movements: Extraocular movements intact.     Conjunctiva/sclera: Conjunctivae normal.     Pupils: Pupils are equal, round, and reactive to light.  Neck:     Vascular: No carotid bruit.  Cardiovascular:     Rate and Rhythm: Normal rate and regular rhythm.     Pulses: Normal pulses.     Heart sounds: No  murmur heard.    No friction rub. No gallop.  Pulmonary:     Effort: Pulmonary effort is normal. No respiratory distress.     Breath sounds: Normal breath sounds. No stridor. No wheezing, rhonchi or rales.  Chest:     Chest wall: No tenderness.  Abdominal:     General: Abdomen is flat. Bowel sounds are normal. There is no distension.     Palpations: Abdomen is soft. There is no mass.     Tenderness: There is no abdominal tenderness. There is no right CVA tenderness, left CVA tenderness, guarding or rebound.      Hernia: No hernia is present.  Genitourinary:    Comments: Breast and pelvic exams deferred with shared decision making Musculoskeletal:        General: No swelling, tenderness, deformity or signs of injury.     Cervical back: Normal range of motion and neck supple. No rigidity. No muscular tenderness.     Right lower leg: No edema.     Left lower leg: No edema.  Lymphadenopathy:     Cervical: No cervical adenopathy.  Skin:    General: Skin is warm and dry.     Capillary Refill: Capillary refill takes less than 2 seconds.     Coloration: Skin is not jaundiced or pale.     Findings: No bruising, erythema, lesion or rash.  Neurological:     General: No focal deficit present.     Mental Status: She is alert and oriented to person, place, and time. Mental status is at baseline.     Cranial Nerves: No cranial nerve deficit.     Sensory: No sensory deficit.     Motor: No weakness.     Coordination: Coordination normal.     Gait: Gait normal.     Deep Tendon Reflexes: Reflexes normal.  Psychiatric:        Mood and Affect: Mood normal.        Behavior: Behavior normal.        Thought Content: Thought content normal.        Judgment: Judgment normal.     Results for orders placed or performed in visit on 11/15/23  Bayer DCA Hb A1c Waived   Collection Time: 11/15/23  9:00 AM  Result Value Ref Range   HB A1C (BAYER DCA - WAIVED) 5.9 (H) 4.8 - 5.6 %      Assessment & Plan:   Problem List Items Addressed This Visit       Cardiovascular and Mediastinum   Hypertension associated with diabetes (HCC) - Primary   Under good control on current regimen. Continue current regimen. Continue to monitor. Call with any concerns. Refills given. Labs drawn today.       Relevant Medications   losartan -hydrochlorothiazide  (HYZAAR) 50-12.5 MG tablet   metFORMIN  (GLUCOPHAGE -XR) 500 MG 24 hr tablet   spironolactone  (ALDACTONE ) 50 MG tablet   tirzepatide  (MOUNJARO ) 12.5 MG/0.5ML Pen   Other  Relevant Orders   CBC with Differential/Platelet   Comprehensive metabolic panel with GFR   TSH     Endocrine   Controlled diabetes mellitus type 2 with complications (HCC)   Doing well with A1c of 5.9- will increase her mounjaro  to 12.5mg  weekly. Recheck 3 months. Call with any concerns.       Relevant Medications   losartan -hydrochlorothiazide  (HYZAAR) 50-12.5 MG tablet   metFORMIN  (GLUCOPHAGE -XR) 500 MG 24 hr tablet   tirzepatide  (MOUNJARO ) 12.5 MG/0.5ML Pen   Other Relevant Orders  CBC with Differential/Platelet   Comprehensive metabolic panel with GFR   Microalbumin, Urine Waived   Bayer DCA Hb A1c Waived (Completed)     Other   Hyperlipidemia   Under good control on current regimen. Continue current regimen. Continue to monitor. Call with any concerns. Refills given. Labs drawn today.        Relevant Medications   losartan -hydrochlorothiazide  (HYZAAR) 50-12.5 MG tablet   spironolactone  (ALDACTONE ) 50 MG tablet   Other Relevant Orders   CBC with Differential/Platelet   Comprehensive metabolic panel with GFR   Lipid Panel w/o Chol/HDL Ratio   Vitamin D  deficiency   Rechecking labs today. Await results. Call with any concerns.       Relevant Orders   CBC with Differential/Platelet   Comprehensive metabolic panel with GFR   VITAMIN D  25 Hydroxy (Vit-D Deficiency, Fractures)   Other Visit Diagnoses       Screening for colon cancer       Cologuard ordered today.   Relevant Orders   Cologuard        Follow up plan: Return in about 3 months (around 02/15/2024).

## 2023-11-16 LAB — COMPREHENSIVE METABOLIC PANEL WITH GFR
ALT: 21 IU/L (ref 0–32)
AST: 18 IU/L (ref 0–40)
Albumin: 4.3 g/dL (ref 3.8–4.8)
Alkaline Phosphatase: 54 IU/L (ref 44–121)
BUN/Creatinine Ratio: 15 (ref 12–28)
BUN: 12 mg/dL (ref 8–27)
Bilirubin Total: 0.6 mg/dL (ref 0.0–1.2)
CO2: 22 mmol/L (ref 20–29)
Calcium: 9.7 mg/dL (ref 8.7–10.3)
Chloride: 85 mmol/L — ABNORMAL LOW (ref 96–106)
Creatinine, Ser: 0.79 mg/dL (ref 0.57–1.00)
Globulin, Total: 2.7 g/dL (ref 1.5–4.5)
Glucose: 112 mg/dL — ABNORMAL HIGH (ref 70–99)
Potassium: 4.6 mmol/L (ref 3.5–5.2)
Sodium: 123 mmol/L — ABNORMAL LOW (ref 134–144)
Total Protein: 7 g/dL (ref 6.0–8.5)
eGFR: 78 mL/min/1.73 (ref >59)

## 2023-11-16 LAB — CBC WITH DIFFERENTIAL/PLATELET
Basophils Absolute: 0 x10E3/uL (ref 0.0–0.2)
Basos: 0 %
EOS (ABSOLUTE): 0.2 x10E3/uL (ref 0.0–0.4)
Eos: 2 %
Hematocrit: 41.6 % (ref 34.0–46.6)
Hemoglobin: 14.3 g/dL (ref 11.1–15.9)
Immature Grans (Abs): 0.1 x10E3/uL (ref 0.0–0.1)
Immature Granulocytes: 1 %
Lymphocytes Absolute: 3.4 x10E3/uL — ABNORMAL HIGH (ref 0.7–3.1)
Lymphs: 36 %
MCH: 30.6 pg (ref 26.6–33.0)
MCHC: 34.4 g/dL (ref 31.5–35.7)
MCV: 89 fL (ref 79–97)
Monocytes Absolute: 0.7 x10E3/uL (ref 0.1–0.9)
Monocytes: 7 %
Neutrophils Absolute: 5 x10E3/uL (ref 1.4–7.0)
Neutrophils: 54 %
Platelets: 279 x10E3/uL (ref 150–450)
RBC: 4.68 x10E6/uL (ref 3.77–5.28)
RDW: 12.5 % (ref 11.7–15.4)
WBC: 9.3 x10E3/uL (ref 3.4–10.8)

## 2023-11-16 LAB — LIPID PANEL W/O CHOL/HDL RATIO
Cholesterol, Total: 101 mg/dL (ref 100–199)
HDL: 43 mg/dL (ref >39)
LDL Chol Calc (NIH): 38 mg/dL (ref 0–99)
Triglycerides: 108 mg/dL (ref 0–149)
VLDL Cholesterol Cal: 20 mg/dL (ref 5–40)

## 2023-11-16 LAB — TSH: TSH: 1.35 u[IU]/mL (ref 0.450–4.500)

## 2023-11-16 LAB — VITAMIN D 25 HYDROXY (VIT D DEFICIENCY, FRACTURES): Vit D, 25-Hydroxy: 65.7 ng/mL (ref 30.0–100.0)

## 2023-11-17 LAB — MICROALBUMIN, URINE WAIVED
Creatinine, Urine Waived: 50 mg/dL (ref 10–300)
Microalb, Ur Waived: 30 mg/L — ABNORMAL HIGH (ref 0–19)

## 2023-11-20 ENCOUNTER — Ambulatory Visit: Payer: Self-pay | Admitting: Family Medicine

## 2023-11-20 ENCOUNTER — Other Ambulatory Visit: Payer: Self-pay | Admitting: Family Medicine

## 2023-11-21 ENCOUNTER — Ambulatory Visit
Admission: RE | Admit: 2023-11-21 | Discharge: 2023-11-21 | Disposition: A | Discharge status: Home / Self Care | Source: Ambulatory Visit | Attending: Oncology | Admitting: Oncology

## 2023-11-21 DIAGNOSIS — Z08 Encounter for follow-up examination after completed treatment for malignant neoplasm: Secondary | ICD-10-CM | POA: Diagnosis present

## 2023-11-21 DIAGNOSIS — Z853 Personal history of malignant neoplasm of breast: Secondary | ICD-10-CM | POA: Diagnosis present

## 2023-11-22 NOTE — Telephone Encounter (Signed)
 Requested Prescriptions  Refused Prescriptions Disp Refills   MOUNJARO  10 MG/0.5ML Pen [Pharmacy Med Name: MOUNJARO  10 MG/0.5 ML PEN]  1    Sig: INJECT 10 MG INTO THE SKIN ONCE A WEEK. REPLACING 7.5MG  DOSE.     There is no refill protocol information for this order

## 2023-11-24 ENCOUNTER — Encounter: Payer: Self-pay | Admitting: Family Medicine

## 2023-11-25 LAB — COLOGUARD

## 2023-12-01 ENCOUNTER — Encounter: Payer: Self-pay | Admitting: Urology

## 2023-12-21 NOTE — Progress Notes (Signed)
   12/21/2023  Patient ID: Sharon Ware, female   DOB: 02-28-1951, 74 y.o.   MRN: [id number]  Message stating patient had called to cancel telephone visit scheduled for tomorrow morning.  Contacted patient, and she states things are going well with medications and no follow-up is needed at this time.  Appointment has been canceled, but I advised her to reach out if anything medication related comes up I can assist with.  Channing DELENA Mealing, PharmD, DPLA

## 2023-12-22 ENCOUNTER — Other Ambulatory Visit

## 2024-01-16 ENCOUNTER — Inpatient Hospital Stay: Attending: Oncology | Admitting: Nurse Practitioner

## 2024-01-16 ENCOUNTER — Encounter: Payer: Self-pay | Admitting: Nurse Practitioner

## 2024-01-16 VITALS — BP 121/76 | HR 106 | Temp 98.2°F | Resp 16 | Ht 63.0 in

## 2024-01-16 DIAGNOSIS — Z17411 Hormone receptor positive with human epidermal growth factor receptor 2 negative status: Secondary | ICD-10-CM | POA: Insufficient documentation

## 2024-01-16 DIAGNOSIS — Z79811 Long term (current) use of aromatase inhibitors: Secondary | ICD-10-CM | POA: Diagnosis not present

## 2024-01-16 DIAGNOSIS — C50912 Malignant neoplasm of unspecified site of left female breast: Secondary | ICD-10-CM | POA: Diagnosis present

## 2024-01-16 DIAGNOSIS — Z08 Encounter for follow-up examination after completed treatment for malignant neoplasm: Secondary | ICD-10-CM

## 2024-01-16 DIAGNOSIS — C50212 Malignant neoplasm of upper-inner quadrant of left female breast: Secondary | ICD-10-CM

## 2024-01-16 NOTE — Progress Notes (Signed)
 No concerns today

## 2024-01-16 NOTE — Progress Notes (Signed)
 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  Ultrasound showed a 1.2 x 0.8 x 0.7 cm mass 8 cm from the nipple at the 10:30 position.  No suspicious left axillary adenopathy.  This was biopsied and was consistent with invasive mammary carcinoma with tubular features grade 1.  No lymphovascular invasion identified.  ER/PR greater than 90% positive HER2 negative   Final pathology from lumpectomy showed tubular carcinoma grade 10 mm.  Multifocal superior margin positive.  Mutual decision between surgery radiation oncology and patient was to forego reexcision and proceed with adjuvant radiation treatment with radiation boost to the superior margin.   Patient completed adjuvant radiation treatment and started letrozole  in November 2022.  Baseline bone density scan normal    Interval history- Sharon Ware is a 73 y.o. female with above history of breast cancer, now on letrozole , who returns to clinic for routine follow-up and continued surveillance. She feels well and denies unintentional weight  loss. No unusual bone pain. Denies breast complaints. Had a headache a couple weeks ago that resolved without intervention.   ECOG PS- 3 Pain scale- 0  Review of systems- Review of Systems  Constitutional:  Positive for malaise/fatigue. Negative for chills, fever and weight loss.  HENT:  Negative for congestion, ear discharge and nosebleeds.   Eyes:  Negative for blurred vision.  Respiratory:  Negative for cough, hemoptysis, sputum production, shortness of breath and wheezing.   Cardiovascular:  Negative for chest pain, palpitations, orthopnea and claudication.  Gastrointestinal:  Negative for abdominal pain, blood in stool, constipation, diarrhea, heartburn, melena, nausea and vomiting.  Genitourinary:  Negative for dysuria, flank pain, frequency, hematuria and urgency.  Musculoskeletal:  Negative for back pain, joint pain and myalgias.  Skin:  Negative for rash.  Neurological:  Negative for dizziness, tingling, focal weakness, seizures, weakness and headaches.  Endo/Heme/Allergies:  Does not bruise/bleed easily.  Psychiatric/Behavioral:  Negative for depression and suicidal ideas. The patient does not have insomnia.     Allergies  Allergen Reactions   Other Anaphylaxis    Honey Dew Melon   Wound Dressing Adhesive Rash   Ciprofloxacin      Back spasms   Morphine And Codeine Other (See Comments)    Hypotension   Shrimp (Diagnostic)    Latex Rash   Past Medical History:  Diagnosis Date   Aortic atherosclerosis    Arthritis of both knees    Breast cancer (HCC)    Cataract    Genitourinary syndrome of menopause    Hiatal hernia    History of kidney stones    Hyperlipidemia    Hypertension    Long term current use of aromatase inhibitor    a.) letrozole   Long term current use of aspirin     Lymphedema    Malignant neoplasm of upper-inner quadrant of left breast in female, estrogen receptor positive (HCC)    Morbid obesity with BMI of 60.0-69.9, adult (HCC)    Pneumonia     T2DM (type 2 diabetes mellitus) (HCC)    Thoracic ascending aortic aneurysm 09/25/2022   a.) CT chest 09/25/2022: 4.2 cm   Umbilical hernia    Urinary tract infection due to ESBL Klebsiella    Vitamin D  deficiency    Past Surgical History:  Procedure Laterality Date   ABDOMINAL HYSTERECTOMY     x2   BREAST BIOPSY Right 1990s   neg   BREAST BIOPSY Right 2000s   neg   BREAST BIOPSY Left 11/24/2020   u/s bx 10:30/8 cmfn-heart marker   BREAST EXCISIONAL BIOPSY Right    neg   BREAST LUMPECTOMY     BREAST LUMPECTOMY WITH RADIOFREQUENCY TAG IDENTIFICATION Left 12/10/2020   Procedure: BREAST LUMPECTOMY WITH RADIOFREQUENCY TAG IDENTIFICATION;  Surgeon: Lane Shope, MD;  Location: [facility] ORS;  Service: General;  Laterality: Left;   COLONOSCOPY     CYSTOSCOPY/URETEROSCOPY/HOLMIUM LASER/STENT PLACEMENT Right 12/10/2022   Procedure: CYSTOSCOPY/URETEROSCOPY/HOLMIUM LASER/STENT PLACEMENT;  Surgeon: Francisca Redell BROCKS, MD;  Location: [facility] ORS;  Service: Urology;  Laterality: Right;   DILATION AND CURETTAGE OF UTERUS     EYE SURGERY  2004   cataract surgery and laser surgery   Salpingo-oophorectomy     Social History   Socioeconomic History   Marital status: Married    Spouse name: Lynwood   Number of children: 2   Years of education: Not on file   Highest education level: Not on file  Occupational History   Occupation: retired  Tobacco Use   Smoking status: Never    Passive exposure: Never   Smokeless tobacco: Never  Vaping Use   Vaping status: Never Used  Substance and Sexual Activity   Alcohol use: Not Currently   Drug use: Never   Sexual activity: Not Currently  Other Topics Concern   Not on file  Social History Narrative   Not on file   Social Drivers of Health   Financial Resource Strain: Low Risk  (11/03/2023)   Overall Financial Resource Strain (CARDIA)    Difficulty of Paying Living Expenses: Not hard at all  Food Insecurity: No Food Insecurity (11/03/2023)   Hunger  Vital Sign    Worried About Running Out of Food in the Last Year: Never true    Ran Out of Food in the Last Year: Never true  Transportation Needs: No Transportation Needs (11/03/2023)   PRAPARE - Administrator, Civil Service (Medical): No    Lack of Transportation (Non-Medical): No  Physical Activity: Inactive (11/03/2023)   Exercise Vital Sign    Days of Exercise per Week: 0 days    Minutes of Exercise per Session: 0 min  Stress: No Stress Concern Present (11/03/2023)   [employer] of Occupational Health - Occupational Stress Questionnaire    Feeling of Stress: Not at all  Social Connections: Moderately Isolated (11/03/2023)   Social Connection and Isolation Panel    Frequency of Communication with Friends and Family: Three times a week    Frequency of Social Gatherings with Friends and Family: More than three times a week    Attends Religious Services: Never    Database administrator or Organizations: No    Attends Banker Meetings: Never    Marital Status:  Married  Intimate Partner Violence: Not At Risk (11/03/2023)   Humiliation, Afraid, Rape, and Kick questionnaire    Fear of Current or Ex-Partner: No    Emotionally Abused: No    Physically Abused: No    Sexually Abused: No   Family History  Problem Relation Age of Onset   Breast cancer Mother 99   Heart disease Father    Cancer Paternal Grandfather    Heart disease Paternal Grandfather     Current Outpatient Medications:    acetaminophen  (TYLENOL ) 650 MG CR tablet, Take 650 mg by mouth every 8 (eight) hours as needed for pain., Disp: , Rfl:    aspirin  81 MG tablet, Take 81 mg by mouth daily., Disp: , Rfl:    atorvastatin  (LIPITOR) 20 MG tablet, Take 1 tablet (20 mg total) by mouth daily., Disp: 90 tablet, Rfl: 3   Calcium  Citrate-Vitamin D  (CALCIUM  CITRATE + D3 PO), Take 1,200 mg by mouth daily., Disp: , Rfl:    Cholecalciferol (VITAMIN D3) 125 MCG (5000 UT) TABS, Take 5,000 Units by mouth  daily., Disp: , Rfl:    CRANBERRY PO, Take 1 capsule by mouth daily at 12 noon., Disp: , Rfl:    letrozole  (FEMARA ) 2.5 MG tablet, Take 1 tablet (2.5 mg total) by mouth daily., Disp: 90 tablet, Rfl: 2   losartan -hydrochlorothiazide  (HYZAAR) 50-12.5 MG tablet, Take 1 tablet by mouth daily., Disp: 90 tablet, Rfl: 1   metFORMIN  (GLUCOPHAGE -XR) 500 MG 24 hr tablet, Take 2 tablets (1,000 mg total) by mouth 2 (two) times daily with a meal., Disp: 360 tablet, Rfl: 1   methenamine  (HIPREX ) 1 g tablet, Take 1 tablet (1 g total) by mouth 2 (two) times daily with a meal., Disp: 60 tablet, Rfl: 11   Omega-3 Fatty Acids (FISH OIL ULTRA) 1400 MG CAPS, Take 1,400 mg by mouth daily., Disp: , Rfl:    spironolactone  (ALDACTONE ) 50 MG tablet, Take 1 tablet (50 mg total) by mouth daily., Disp: 90 tablet, Rfl: 1   tirzepatide  (MOUNJARO ) 12.5 MG/0.5ML Pen, Inject 12.5 mg into the skin once a week. Replacing the 10mg  dose, Disp: 6 mL, Rfl: 0   UNABLE TO FIND, 3 (three) times a week. Probiotic Vaginal Pearls, Disp: , Rfl:    Menthol, Topical Analgesic, (ICY HOT EX), Apply 1 application topically daily as needed (pain). (Patient not taking: Reported on 01/16/2024), Disp: , Rfl:   Physical exam:  Vitals:   01/16/24 1039  BP: (!) 156/73  Pulse: (!) 106  Resp: 16  Temp: 98.2 F (36.8 C)  TempSrc: Tympanic  SpO2: 98%  Height: 5' 3 (1.6 m)   Physical Exam Vitals reviewed.  Constitutional:      Appearance: She is not ill-appearing.     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  Cardiovascular:     Rate and Rhythm: Normal rate and regular rhythm.  Pulmonary:     Effort: Pulmonary effort is normal. No respiratory distress.     Breath sounds: Normal breath sounds.  Chest:     Comments: Breast exam deferred Abdominal:     General: There is no distension.     Tenderness: There is no abdominal tenderness.  Musculoskeletal:     Right lower leg: Edema present.     Left lower leg: Edema present.   Lymphadenopathy:     Cervical: No cervical adenopathy.  Skin:    General: Skin is warm and dry.     Coloration: Skin is not pale.     Findings:  No rash.  Neurological:     Mental Status: She is alert and oriented to person, place, and time.  Psychiatric:        Mood and Affect: Mood normal.        Behavior: Behavior normal.    Labs:  No labs onsite today  Radiology review: I have personally reviewed Radiology images listed below:  11/21/2023-MM 3D diagnostic mammogram bilateral breast  CLINICAL DATA:  73 year old female presents for annual follow-up. History of LEFT breast cancer and lumpectomy in 2022.   EXAM: DIGITAL DIAGNOSTIC BILATERAL MAMMOGRAM WITH TOMOSYNTHESIS AND CAD   TECHNIQUE: Bilateral digital diagnostic mammography and breast tomosynthesis was performed. The images were evaluated with computer-aided detection.   COMPARISON:  Previous exam(s).   ACR Breast Density Category b: There are scattered areas of fibroglandular density.   FINDINGS: Full field views of both breasts and a magnification view of the lumpectomy site demonstrate no suspicious mass, nonsurgical distortion or worrisome calcifications.   LEFT breast surgical changes again noted.   IMPRESSION: No evidence of breast malignancy.   RECOMMENDATION: Per protocol, as the patient is now 2 or more years status post lumpectomy, she may return to annual screening mammography in 1 year. However, given the history of breast cancer, the patient remains eligible for annual diagnostic mammography if preferred. (Code:SM-B-01Y)   I have discussed the findings and recommendations with the patient. If applicable, a reminder letter will be sent to the patient regarding the next appointment.   BI-RADS CATEGORY  2: Benign.   Electronically Signed   By: Reyes Phi M.D.   On: 11/21/2023 14:48  No results found.   Assessment and plan- Patient is a 73 y.o. female who returns to clinic for follow-up of  Breast  cancer surveillance-history of stage I left breast cancer-ER/PR positive HER2/neu negative.  Diagnosed on screening mammogram in August 2022.  Pathology consistent with invasive mammary carcinoma with tubular features.  Grade 1.  No lymphovascular invasion identified.  She underwent a lumpectomy which confirmed tubular carcinoma grade 1.  Multifocal superior margin positive.  Mutual decision between surgery, radiation oncology, and patient was to forego reexcision and proceed with adjuvant radiation with boost to the superior margin.  She completed adjuvant radiation and started letrozole  in November 2022.  Her most recent mammogram in August 2025 was reported as BI-RADS Category 2: Benign.  Breast density category B.  Clinically patient is doing well without any signs or symptoms of recurrence today.  Plan is to continue taking letrozole  through November 2027.  We will repeat mammogram annually and her next will be due in August 2026.  We reviewed signs or symptoms that would be concerning for recurrence and warrant sooner return to clinic. Bone health-her baseline bone density scan was normal without evidence of osteopenia or osteoporosis.  She will continue calcium  and vitamin D .  Her repeat bone density scan in March 2025 was reported with a T-score of 1.8.  Normal.  Plan to repeat in March 2027.  Disposition:  6 mo- see Dr Melanee for breast cancer follow up- [place]   Visit Diagnosis 1. Encounter for follow-up surveillance of breast cancer   2. Encounter for monitoring aromatase inhibitor therapy    Tinnie Dawn, DNP, AGNP-C, [facility] [phone] (clinic) 01/16/2024

## 2024-01-18 ENCOUNTER — Ambulatory Visit: Admitting: Oncology

## 2024-01-18 LAB — COLOGUARD: COLOGUARD: NEGATIVE

## 2024-01-20 ENCOUNTER — Encounter: Payer: Self-pay | Admitting: Family Medicine

## 2024-01-20 LAB — OPHTHALMOLOGY REPORT-SCANNED

## 2024-02-07 ENCOUNTER — Other Ambulatory Visit: Payer: Self-pay | Admitting: Family Medicine

## 2024-02-08 NOTE — Telephone Encounter (Signed)
 Requested medication (s) are due for refill today - yes  Requested medication (s) are on the active medication list -yes  Future visit scheduled -yes  Last refill: 11/15/23 6ml  Notes to clinic: off protocol- provider review   Requested Prescriptions  Pending Prescriptions Disp Refills   MOUNJARO  12.5 MG/0.5ML Pen [Pharmacy Med Name: MOUNJARO  12.5 MG/0.5 ML PEN]      Sig: INJECT 12.5 MG INTO THE SKIN ONCE A WEEK. REPLACING THE 10MG  DOSE     Off-Protocol Failed - 02/08/2024  4:22 PM      Failed - Medication not assigned to a protocol, review manually.      Passed - Valid encounter within last 12 months    Recent Outpatient Visits           2 months ago Hypertension associated with diabetes [facility])   [facility] [place], Megan P, DO   6 months ago Controlled type 2 diabetes mellitus with complication, without long-term current use of insulin  [facility])   [facility] Vicci Duwaine SQUIBB, DO       Future Appointments             In 5 months Francisca, Redell BROCKS, MD [facility] [place]               Requested Prescriptions  Pending Prescriptions Disp Refills   MOUNJARO  12.5 MG/0.5ML Pen [Pharmacy Med Name: MOUNJARO  12.5 MG/0.5 ML PEN]      Sig: INJECT 12.5 MG INTO THE SKIN ONCE A WEEK. REPLACING THE 10MG  DOSE     Off-Protocol Failed - 02/08/2024  4:22 PM      Failed - Medication not assigned to a protocol, review manually.      Passed - Valid encounter within last 12 months    Recent Outpatient Visits           2 months ago Hypertension associated with diabetes [facility])   [place] [facility] [place], Megan P, DO   6 months ago Controlled type 2 diabetes mellitus with complication, without long-term current use of insulin  [facility])   [place] [place], Duwaine SQUIBB, DO       Future Appointments             In 5 months Sn[place], Redell BROCKS, MD [facility] [place]

## 2024-02-09 ENCOUNTER — Other Ambulatory Visit: Payer: Self-pay | Admitting: Family Medicine

## 2024-02-09 NOTE — Telephone Encounter (Signed)
 Copied from CRM [id number]. Topic: Clinical - Medication Question >> Feb 09, 2024  1:22 PM Mia F wrote: Reason for CRM: Pt is calling in because she is almost out of her tirzepatide  (MOUNJARO ) 12.5 MG/0.5ML Pen. Her last dose will be tomorrow. She called this in to the pharmacy earlier this week and it was denied stating she needs an appt. Pt is currently scheduled for 11/21 as she was due to return around 11/13. The soonest appt available is 11/13 which she is not available and she will be out of meds by then. Pt is asking for an appt for Today or Monday if needed. Please advise

## 2024-02-09 NOTE — Telephone Encounter (Signed)
 Copied from CRM [id number]. Topic: Clinical - Medication Refill >> Feb 09, 2024  1:16 PM Mia F wrote: Medication: tirzepatide  (MOUNJARO ) 12.5 MG/0.5ML Pen   Has the patient contacted their pharmacy? Yes (Agent: If no, request that the patient contact the pharmacy for the refill. If patient does not wish to contact the pharmacy document the reason why and proceed with request.) (Agent: If yes, when and what did the pharmacy advise?)  This is the patient's preferred pharmacy:  [facility] [phone] [address] [place], [place] - [address] [place] [address] Phone: [phone] Fax: [phone]  Is this the correct pharmacy for this prescription? Yes If no, delete pharmacy and type the correct one.   Has the prescription been filled recently? Yes  Is the patient out of the medication? No, last dose is tomorrow  Has the patient been seen for an appointment in the last year OR does the patient have an upcoming appointment? Yes  Can we respond through MyChart? No  Agent: Please be advised that Rx refills may take up to 3 business days. We ask that you follow-up with your pharmacy.

## 2024-02-10 NOTE — Telephone Encounter (Signed)
 Requested medication (s) are due for refill today: yes  Requested medication (s) are on the active medication list: yes  Last refill:  11/15/23  Future visit scheduled: yes  Notes to clinic:  routing for review     Requested Prescriptions  Pending Prescriptions Disp Refills   MOUNJARO  12.5 MG/0.5ML Pen [Pharmacy Med Name: MOUNJARO  12.5 MG/0.5 ML PEN]      Sig: INJECT 12.5 MG INTO THE SKIN ONCE A WEEK. REPLACING THE 10MG  DOSE     Off-Protocol Failed - 02/10/2024  3:59 PM      Failed - Medication not assigned to a protocol, review manually.      Passed - Valid encounter within last 12 months    Recent Outpatient Visits           2 months ago Hypertension associated with diabetes [facility])   [place] [facility] [place], Megan P, DO   6 months ago Controlled type 2 diabetes mellitus with complication, without long-term current use of insulin  [facility])   [place] [facility] [place], Duwaine SQUIBB, DO       Future Appointments             In 5 months Sninsky, Redell BROCKS, MD [facility] [place]

## 2024-02-10 NOTE — Telephone Encounter (Signed)
 Requested medications are due for refill today.  yes  Requested medications are on the active medications list.  yes  Last refill. 11/15/2023 6mL 0 rf  Future visit scheduled.   yes  Notes to clinic.  Medication not assigned to a protocol. Please review for refill.    Requested Prescriptions  Pending Prescriptions Disp Refills   tirzepatide  (MOUNJARO ) 12.5 MG/0.5ML Pen 6 mL 0    Sig: Inject 12.5 mg into the skin once a week. Replacing the 10mg  dose     Off-Protocol Failed - 02/10/2024  3:21 PM      Failed - Medication not assigned to a protocol, review manually.      Passed - Valid encounter within last 12 months    Recent Outpatient Visits           2 months ago Hypertension associated with diabetes [facility])   [facility] [place], Megan P, DO   6 months ago Controlled type 2 diabetes mellitus with complication, without long-term current use of insulin  [facility])   [place] [facility] [place], Duwaine SQUIBB, DO       Future Appointments             In 5 months Sninsky, Redell BROCKS, MD [facility] [place]

## 2024-02-10 NOTE — Telephone Encounter (Signed)
 Requested medication (s) are due for refill today -yes  Requested medication (s) are on the active medication list -yes  Future visit scheduled -yes  Last refill: 11/15/23 6ml  Notes to clinic: off protocol- patient requesting RF until appointment has scheduled for first available   Requested Prescriptions  Pending Prescriptions Disp Refills   tirzepatide  (MOUNJARO ) 12.5 MG/0.5ML Pen 6 mL 0    Sig: Inject 12.5 mg into the skin once a week. Replacing the 10mg  dose     Off-Protocol Failed - 02/10/2024  3:27 PM      Failed - Medication not assigned to a protocol, review manually.      Passed - Valid encounter within last 12 months    Recent Outpatient Visits           2 months ago Hypertension associated with diabetes [facility])   [place] [facility] [place], Megan P, DO   6 months ago Controlled type 2 diabetes mellitus with complication, without long-term current use of insulin  [facility])   [place] [facility] [place], [place], DO       Future Appointments             In 5 months [place] Redell BROCKS, MD [facility] [place]               Requested Prescriptions  Pending Prescriptions Disp Refills   tirzepatide  (MOUNJARO ) 12.5 MG/0.5ML Pen 6 mL 0    Sig: Inject 12.5 mg into the skin once a week. Replacing the 10mg  dose     Off-Protocol Failed - 02/10/2024  3:27 PM      Failed - Medication not assigned to a protocol, review manually.      Passed - Valid encounter within last 12 months    Recent Outpatient Visits           2 months ago Hypertension associated with diabetes [facility])   [place] [facility] [place], Megan P, DO   6 months ago Controlled type 2 diabetes mellitus with complication, without long-term current use of insulin  [facility])   [place] [facility] [place], Duwaine SQUIBB, DO       Future Appointments             In 5 months Sninsky, Redell BROCKS, MD [facility] [place]

## 2024-02-12 MED ORDER — TIRZEPATIDE 12.5 MG/0.5ML ~~LOC~~ SOAJ: 12.5000 mg | SUBCUTANEOUS | 0 refills | Status: DC

## 2024-02-16 ENCOUNTER — Ambulatory Visit: Admitting: Family Medicine

## 2024-02-24 ENCOUNTER — Ambulatory Visit (INDEPENDENT_AMBULATORY_CARE_PROVIDER_SITE_OTHER): Admitting: Family Medicine

## 2024-02-24 ENCOUNTER — Encounter: Payer: Self-pay | Admitting: Family Medicine

## 2024-02-24 VITALS — BP 123/74 | HR 66 | Temp 98.0°F | Ht 63.0 in | Wt 298.4 lb

## 2024-02-24 DIAGNOSIS — E118 Type 2 diabetes mellitus with unspecified complications: Secondary | ICD-10-CM | POA: Diagnosis not present

## 2024-02-24 DIAGNOSIS — Z7985 Long-term (current) use of injectable non-insulin antidiabetic drugs: Secondary | ICD-10-CM

## 2024-02-24 LAB — BAYER DCA HB A1C WAIVED: HB A1C (BAYER DCA - WAIVED): 5.6 % (ref 4.8–5.6)

## 2024-02-24 NOTE — Assessment & Plan Note (Signed)
 Doing great with A1c of 5.6! Continue current regimen. Recheck 3 months- if still this good next time will consider cutting back on metformin .

## 2024-02-24 NOTE — Progress Notes (Signed)
 BP 123/74   Pulse 66   Temp 98 F (36.7 C) (Oral)   Ht 5' 3 (1.6 m)   Wt 298 lb 6.4 oz (135.4 kg)   SpO2 97%   BMI 52.86 kg/m    Subjective:    Patient ID: Sharon Ware, female    DOB: 09-30-1950, 73 y.o.   MRN: [id number]  HPI: Sharon Ware is a 73 y.o. female  Chief Complaint  Patient presents with   Diabetes   DIABETES Hypoglycemic episodes:no Polydipsia/polyuria: no Visual disturbance: no Chest pain: no Paresthesias: no Glucose Monitoring: yes Taking Insulin ?: no Blood Pressure Monitoring: rarely Retinal Examination: Up to Date Foot Exam: Up to Date Diabetic Education: Completed Pneumovax: Up to Date Influenza: Up to Date Aspirin : yes   Relevant past medical, surgical, family and social history reviewed and updated as indicated. Interim medical history since our last visit reviewed. Allergies and medications reviewed and updated.  Review of Systems  Constitutional: Negative.   Respiratory: Negative.    Cardiovascular: Negative.   Musculoskeletal: Negative.   Neurological: Negative.   Psychiatric/Behavioral: Negative.      Per HPI unless specifically indicated above     Objective:    BP 123/74   Pulse 66   Temp 98 F (36.7 C) (Oral)   Ht 5' 3 (1.6 m)   Wt 298 lb 6.4 oz (135.4 kg)   SpO2 97%   BMI 52.86 kg/m   Wt Readings from Last 3 Encounters:  02/24/24 298 lb 6.4 oz (135.4 kg)  11/15/23 (!) 306 lb 9.6 oz (139.1 kg)  11/03/23 300 lb (136.1 kg)    Physical Exam Vitals and nursing note reviewed.  Constitutional:      General: She is not in acute distress.    Appearance: Normal appearance. She is not ill-appearing, toxic-appearing or diaphoretic.  HENT:     Head: Normocephalic and atraumatic.     Right Ear: External ear normal.     Left Ear: External ear normal.     Nose: Nose normal.     Mouth/Throat:     Mouth: Mucous membranes are moist.     Pharynx: Oropharynx is clear.  Eyes:     General: No scleral icterus.        Right eye: No discharge.        Left eye: No discharge.     Extraocular Movements: Extraocular movements intact.     Conjunctiva/sclera: Conjunctivae normal.     Pupils: Pupils are equal, round, and reactive to light.  Cardiovascular:     Rate and Rhythm: Normal rate and regular rhythm.     Pulses: Normal pulses.     Heart sounds: Normal heart sounds. No murmur heard.    No friction rub. No gallop.  Pulmonary:     Effort: Pulmonary effort is normal. No respiratory distress.     Breath sounds: Normal breath sounds. No stridor. No wheezing, rhonchi or rales.  Chest:     Chest wall: No tenderness.  Musculoskeletal:        General: Normal range of motion.     Cervical back: Normal range of motion and neck supple.  Skin:    General: Skin is warm and dry.     Capillary Refill: Capillary refill takes less than 2 seconds.     Coloration: Skin is not jaundiced or pale.     Findings: No bruising, erythema, lesion or rash.  Neurological:     General: No focal deficit present.  Mental Status: She is alert and oriented to person, place, and time. Mental status is at baseline.  Psychiatric:        Mood and Affect: Mood normal.        Behavior: Behavior normal.        Thought Content: Thought content normal.        Judgment: Judgment normal.     Results for orders placed or performed in visit on 01/20/24  OPHTHALMOLOGY REPORT-SCANNED   Collection Time: 01/20/24  2:36 PM  Result Value Ref Range   HM Diabetic Eye Exam No Retinopathy No Retinopathy   A Comment        Assessment & Plan:   Problem List Items Addressed This Visit       Endocrine   Controlled diabetes mellitus type 2 with complications (HCC) - Primary   Doing great with A1c of 5.6! Continue current regimen. Recheck 3 months- if still this good next time will consider cutting back on metformin .       Relevant Orders   Bayer DCA Hb A1c Waived     Follow up plan: Return in about 2 months (around  04/25/2024).

## 2024-05-01 ENCOUNTER — Ambulatory Visit: Admitting: Family Medicine

## 2024-05-01 NOTE — Patient Instructions (Signed)

## 2024-05-02 ENCOUNTER — Encounter: Payer: Self-pay | Admitting: Nurse Practitioner

## 2024-05-02 ENCOUNTER — Ambulatory Visit: Admitting: Nurse Practitioner

## 2024-05-02 DIAGNOSIS — I890 Lymphedema, not elsewhere classified: Secondary | ICD-10-CM | POA: Diagnosis not present

## 2024-05-02 DIAGNOSIS — Z7985 Long-term (current) use of injectable non-insulin antidiabetic drugs: Secondary | ICD-10-CM | POA: Diagnosis not present

## 2024-05-02 DIAGNOSIS — E1169 Type 2 diabetes mellitus with other specified complication: Secondary | ICD-10-CM | POA: Diagnosis not present

## 2024-05-02 MED ORDER — SPIRONOLACTONE 50 MG PO TABS: 50.0000 mg | ORAL_TABLET | Freq: Every day | ORAL | 2 refills | Status: AC

## 2024-05-02 MED ORDER — METFORMIN HCL ER 500 MG PO TB24: 1000.0000 mg | ORAL_TABLET | Freq: Two times a day (BID) | ORAL | 2 refills | Status: AC

## 2024-05-02 MED ORDER — TIRZEPATIDE 12.5 MG/0.5ML ~~LOC~~ SOAJ: 12.5000 mg | SUBCUTANEOUS | 1 refills | Status: AC

## 2024-05-02 MED ORDER — ATORVASTATIN CALCIUM 20 MG PO TABS: 20.0000 mg | ORAL_TABLET | Freq: Every day | ORAL | 3 refills | Status: AC

## 2024-05-02 MED ORDER — LOSARTAN POTASSIUM-HCTZ 50-12.5 MG PO TABS: 1.0000 | ORAL_TABLET | Freq: Every day | ORAL | 3 refills | Status: AC

## 2024-05-02 NOTE — Progress Notes (Addendum)
 "  BP 127/73   Pulse (!) 57   Temp 97.6 F (36.4 C) (Oral)   Ht 5' 3 (1.6 m)   Wt 295 lb 12.8 oz (134.2 kg)   SpO2 98%   BMI 52.40 kg/m    Subjective:    Patient ID: Sharon Ware, female    DOB: Sep 02, 1950, 74 y.o.   MRN: [id number]  HPI: Sharon Ware is a 74 y.o. female  Chief Complaint  Patient presents with   Diabetes   Hyperlipidemia   Hypertension   DIABETES Taking Mounjaro , to assist with weight and diabetes, and Metformin . To have repeat A1c at end of February. Does need refills today and a new referral to [facility] for her lymphedema. Hypoglycemic episodes:no Polydipsia/polyuria: no Visual disturbance: no Chest pain: no Paresthesias: no Glucose Monitoring: no  Accucheck frequency: Not Checking  Fasting glucose:  Post prandial:  Evening:  Before meals: Taking Insulin ?: no  Long acting insulin :  Short acting insulin : Blood Pressure Monitoring: not checking Retinal Examination: Up to Date Foot Exam: Up to Date Diabetic Education: Not Completed Pneumovax: Up to Date Influenza: Up to Date Aspirin : yes   Relevant past medical, surgical, family and social history reviewed and updated as indicated. Interim medical history since our last visit reviewed. Allergies and medications reviewed and updated.  Review of Systems  Constitutional:  Negative for activity change, appetite change, diaphoresis, fatigue and fever.  Respiratory:  Negative for cough, chest tightness, shortness of breath and wheezing.   Cardiovascular:  Positive for leg swelling. Negative for chest pain and palpitations.  Gastrointestinal: Negative.   Endocrine: Negative for polydipsia, polyphagia and polyuria.  Neurological: Negative.   Psychiatric/Behavioral: Negative.      Per HPI unless specifically indicated above     Objective:    BP 127/73   Pulse (!) 57   Temp 97.6 F (36.4 C) (Oral)   Ht 5' 3 (1.6 m)   Wt 295 lb 12.8 oz (134.2 kg)   SpO2 98%   BMI 52.40 kg/m    Wt Readings from Last 3 Encounters:  05/02/24 295 lb 12.8 oz (134.2 kg)  02/24/24 298 lb 6.4 oz (135.4 kg)  11/15/23 (!) 306 lb 9.6 oz (139.1 kg)    Physical Exam Vitals and nursing note reviewed.  Constitutional:      General: She is awake. She is not in acute distress.    Appearance: She is well-developed and well-groomed. She is obese. She is not ill-appearing or toxic-appearing.  HENT:     Head: Normocephalic.     Right Ear: Hearing and external ear normal.     Left Ear: Hearing and external ear normal.  Eyes:     General: Lids are normal.        Right eye: No discharge.        Left eye: No discharge.     Conjunctiva/sclera: Conjunctivae normal.     Pupils: Pupils are equal, round, and reactive to light.  Neck:     Thyroid: No thyromegaly.     Vascular: No carotid bruit.  Cardiovascular:     Rate and Rhythm: Normal rate and regular rhythm.     Heart sounds: Normal heart sounds. No murmur heard.    No gallop.  Pulmonary:     Effort: Pulmonary effort is normal. No accessory muscle usage or respiratory distress.     Breath sounds: Normal breath sounds. No decreased breath sounds, wheezing or rales.  Abdominal:     General: Bowel  sounds are normal. There is no distension.     Palpations: Abdomen is soft.     Tenderness: There is no abdominal tenderness.  Musculoskeletal:     Cervical back: Normal range of motion and neck supple.     Right lower leg: Edema present.     Left lower leg: Edema present.  Lymphadenopathy:     Cervical: No cervical adenopathy.  Skin:    General: Skin is warm and dry.  Neurological:     Mental Status: She is alert and oriented to person, place, and time.     Deep Tendon Reflexes: Reflexes are normal and symmetric.     Reflex Scores:      Brachioradialis reflexes are 2+ on the right side and 2+ on the left side.      Patellar reflexes are 2+ on the right side and 2+ on the left side. Psychiatric:        Attention and Perception: Attention  normal.        Mood and Affect: Mood normal.        Speech: Speech normal.        Behavior: Behavior normal. Behavior is cooperative.        Thought Content: Thought content normal.    Results for orders placed or performed in visit on 02/24/24  Bayer DCA Hb A1c Waived   Collection Time: 02/24/24  9:56 AM  Result Value Ref Range   HB A1C (BAYER DCA - WAIVED) 5.6 4.8 - 5.6 %      Assessment & Plan:   Problem List Items Addressed This Visit       Endocrine   Type 2 diabetes mellitus with morbid obesity (HCC) - Primary   Chronic, ongoing. Due for A1c at the end of February, will have her return then and PCP will adjust medications based on this. Discussed with her spouse and her today. Continue current medications.      Relevant Medications   atorvastatin  (LIPITOR) 20 MG tablet   losartan -hydrochlorothiazide  (HYZAAR) 50-12.5 MG tablet   tirzepatide  (MOUNJARO ) 12.5 MG/0.5ML Pen   metFORMIN  (GLUCOPHAGE -XR) 500 MG 24 hr tablet     Other   Lymphedema   Chronic, ongoing. Referral to Zebedee Dec OT at [facility] for this.      Relevant Orders   Ambulatory referral to Occupational Therapy     Follow up plan: Return in about 8 weeks (around 06/25/2024) for T2DM, HTN/HLD.      "

## 2024-05-02 NOTE — Assessment & Plan Note (Signed)
 Chronic, ongoing. Due for A1c at the end of February, will have her return then and PCP will adjust medications based on this. Discussed with her spouse and her today. Continue current medications.

## 2024-05-02 NOTE — Assessment & Plan Note (Signed)
 Chronic, ongoing. Referral to Zebedee Dec OT at [facility] for this.

## 2024-05-17 ENCOUNTER — Ambulatory Visit: Admitting: Occupational Therapy

## 2024-05-30 ENCOUNTER — Ambulatory Visit: Admitting: Family Medicine

## 2024-07-10 ENCOUNTER — Ambulatory Visit: Admitting: Urology

## 2024-07-11 ENCOUNTER — Ambulatory Visit: Admitting: Occupational Therapy

## 2024-07-11 ENCOUNTER — Ambulatory Visit: Admitting: Urology

## 2024-07-16 ENCOUNTER — Inpatient Hospital Stay: Admitting: Oncology

## 2024-11-15 ENCOUNTER — Ambulatory Visit
